# Patient Record
Sex: Male | Born: 1935
Health system: Southern US, Community
[De-identification: ages and names within clinical notes are randomized; demographics above are authoritative.]

## PROBLEM LIST (undated history)

## (undated) DIAGNOSIS — K635 Polyp of colon: Secondary | ICD-10-CM

## (undated) DIAGNOSIS — K219 Gastro-esophageal reflux disease without esophagitis: Secondary | ICD-10-CM

## (undated) DIAGNOSIS — I82409 Acute embolism and thrombosis of unspecified deep veins of unspecified lower extremity: Secondary | ICD-10-CM

## (undated) DIAGNOSIS — C801 Malignant (primary) neoplasm, unspecified: Secondary | ICD-10-CM

## (undated) DIAGNOSIS — F32A Depression, unspecified: Secondary | ICD-10-CM

## (undated) DIAGNOSIS — E785 Hyperlipidemia, unspecified: Secondary | ICD-10-CM

## (undated) DIAGNOSIS — S4292XA Fracture of left shoulder girdle, part unspecified, initial encounter for closed fracture: Secondary | ICD-10-CM

## (undated) DIAGNOSIS — F329 Major depressive disorder, single episode, unspecified: Secondary | ICD-10-CM

## (undated) DIAGNOSIS — I10 Essential (primary) hypertension: Secondary | ICD-10-CM

## (undated) HISTORY — DX: Depression, unspecified: F32.A

## (undated) HISTORY — PX: MECKEL DIVERTICULUM EXCISION: SHX314

## (undated) HISTORY — PX: JOINT REPLACEMENT: SHX530

## (undated) HISTORY — DX: Acute embolism and thrombosis of unspecified deep veins of unspecified lower extremity: I82.409

## (undated) HISTORY — DX: Major depressive disorder, single episode, unspecified: F32.9

## (undated) HISTORY — DX: Polyp of colon: K63.5

## (undated) HISTORY — DX: Essential (primary) hypertension: I10

## (undated) HISTORY — DX: Fracture of left shoulder girdle, part unspecified, initial encounter for closed fracture: S42.92XA

## (undated) HISTORY — PX: VASECTOMY: SHX75

## (undated) HISTORY — DX: Gastro-esophageal reflux disease without esophagitis: K21.9

## (undated) HISTORY — PX: OTHER SURGICAL HISTORY: SHX169

## (undated) HISTORY — DX: Malignant (primary) neoplasm, unspecified: C80.1

## (undated) HISTORY — DX: Hyperlipidemia, unspecified: E78.5

---

## 1995-03-14 DIAGNOSIS — I82409 Acute embolism and thrombosis of unspecified deep veins of unspecified lower extremity: Secondary | ICD-10-CM

## 1995-03-14 HISTORY — DX: Acute embolism and thrombosis of unspecified deep veins of unspecified lower extremity: I82.409

## 1999-05-24 ENCOUNTER — Ambulatory Visit (HOSPITAL_COMMUNITY): Admission: RE | Admit: 1999-05-24 | Discharge: 1999-05-24 | Payer: Self-pay | Admitting: Internal Medicine

## 1999-05-24 ENCOUNTER — Encounter: Payer: Self-pay | Admitting: Internal Medicine

## 1999-06-07 ENCOUNTER — Encounter: Payer: Self-pay | Admitting: Orthopedic Surgery

## 1999-06-13 ENCOUNTER — Encounter: Payer: Self-pay | Admitting: Orthopedic Surgery

## 1999-06-13 ENCOUNTER — Inpatient Hospital Stay (HOSPITAL_COMMUNITY): Admission: RE | Admit: 1999-06-13 | Discharge: 1999-06-18 | Payer: Self-pay | Admitting: Orthopedic Surgery

## 1999-06-13 ENCOUNTER — Encounter (INDEPENDENT_AMBULATORY_CARE_PROVIDER_SITE_OTHER): Payer: Self-pay

## 1999-06-16 ENCOUNTER — Encounter: Payer: Self-pay | Admitting: Orthopedic Surgery

## 1999-06-18 ENCOUNTER — Encounter: Payer: Self-pay | Admitting: Orthopedic Surgery

## 2000-08-27 ENCOUNTER — Emergency Department (HOSPITAL_COMMUNITY): Admission: EM | Admit: 2000-08-27 | Discharge: 2000-08-27 | Payer: Self-pay | Admitting: Internal Medicine

## 2000-09-05 ENCOUNTER — Emergency Department (HOSPITAL_COMMUNITY): Admission: EM | Admit: 2000-09-05 | Discharge: 2000-09-05 | Payer: Self-pay | Admitting: Emergency Medicine

## 2002-03-13 DIAGNOSIS — C801 Malignant (primary) neoplasm, unspecified: Secondary | ICD-10-CM

## 2002-03-13 HISTORY — PX: PROSTATECTOMY: SHX69

## 2002-03-13 HISTORY — DX: Malignant (primary) neoplasm, unspecified: C80.1

## 2002-12-09 ENCOUNTER — Ambulatory Visit: Admission: RE | Admit: 2002-12-09 | Discharge: 2003-02-12 | Payer: Self-pay | Admitting: Radiation Oncology

## 2002-12-17 ENCOUNTER — Encounter: Payer: Self-pay | Admitting: Urology

## 2002-12-17 ENCOUNTER — Encounter: Admission: RE | Admit: 2002-12-17 | Discharge: 2002-12-17 | Payer: Self-pay | Admitting: Urology

## 2003-01-21 ENCOUNTER — Encounter (INDEPENDENT_AMBULATORY_CARE_PROVIDER_SITE_OTHER): Payer: Self-pay | Admitting: Specialist

## 2003-01-21 ENCOUNTER — Inpatient Hospital Stay (HOSPITAL_COMMUNITY): Admission: RE | Admit: 2003-01-21 | Discharge: 2003-01-24 | Payer: Self-pay | Admitting: Urology

## 2003-05-30 ENCOUNTER — Emergency Department (HOSPITAL_COMMUNITY): Admission: EM | Admit: 2003-05-30 | Discharge: 2003-05-30 | Payer: Self-pay | Admitting: Emergency Medicine

## 2003-06-25 ENCOUNTER — Ambulatory Visit: Admission: RE | Admit: 2003-06-25 | Discharge: 2003-08-17 | Payer: Self-pay | Admitting: Radiation Oncology

## 2003-09-17 ENCOUNTER — Ambulatory Visit: Admission: RE | Admit: 2003-09-17 | Discharge: 2003-12-09 | Payer: Self-pay | Admitting: Radiation Oncology

## 2003-12-22 ENCOUNTER — Ambulatory Visit: Admission: RE | Admit: 2003-12-22 | Discharge: 2003-12-22 | Payer: Self-pay | Admitting: Radiation Oncology

## 2004-03-01 ENCOUNTER — Ambulatory Visit: Payer: Self-pay | Admitting: Internal Medicine

## 2004-03-10 ENCOUNTER — Ambulatory Visit: Payer: Self-pay | Admitting: Internal Medicine

## 2004-03-13 HISTORY — PX: COLONOSCOPY: SHX174

## 2004-03-21 ENCOUNTER — Ambulatory Visit: Payer: Self-pay | Admitting: Internal Medicine

## 2004-05-11 ENCOUNTER — Ambulatory Visit: Payer: Self-pay | Admitting: Internal Medicine

## 2004-05-12 ENCOUNTER — Encounter: Admission: RE | Admit: 2004-05-12 | Discharge: 2004-05-12 | Payer: Self-pay | Admitting: Internal Medicine

## 2004-05-19 ENCOUNTER — Ambulatory Visit: Payer: Self-pay

## 2004-05-31 ENCOUNTER — Ambulatory Visit: Payer: Self-pay | Admitting: Internal Medicine

## 2004-06-22 ENCOUNTER — Ambulatory Visit: Payer: Self-pay | Admitting: Internal Medicine

## 2004-09-05 ENCOUNTER — Ambulatory Visit: Payer: Self-pay | Admitting: Internal Medicine

## 2004-09-19 ENCOUNTER — Ambulatory Visit: Payer: Self-pay | Admitting: Internal Medicine

## 2004-09-19 ENCOUNTER — Encounter: Admission: RE | Admit: 2004-09-19 | Discharge: 2004-09-19 | Payer: Self-pay | Admitting: Internal Medicine

## 2004-10-06 ENCOUNTER — Encounter: Admission: RE | Admit: 2004-10-06 | Discharge: 2005-01-04 | Payer: Self-pay | Admitting: Internal Medicine

## 2004-12-05 ENCOUNTER — Ambulatory Visit: Payer: Self-pay | Admitting: Internal Medicine

## 2004-12-12 ENCOUNTER — Ambulatory Visit: Payer: Self-pay | Admitting: Internal Medicine

## 2005-06-06 ENCOUNTER — Ambulatory Visit: Payer: Self-pay | Admitting: Internal Medicine

## 2005-06-12 ENCOUNTER — Ambulatory Visit: Payer: Self-pay | Admitting: Internal Medicine

## 2005-07-19 ENCOUNTER — Ambulatory Visit: Payer: Self-pay | Admitting: Internal Medicine

## 2005-09-06 ENCOUNTER — Ambulatory Visit: Payer: Self-pay | Admitting: Internal Medicine

## 2005-11-22 ENCOUNTER — Ambulatory Visit: Payer: Self-pay | Admitting: Internal Medicine

## 2006-01-25 ENCOUNTER — Ambulatory Visit: Payer: Self-pay | Admitting: Internal Medicine

## 2006-05-02 DIAGNOSIS — R7309 Other abnormal glucose: Secondary | ICD-10-CM | POA: Insufficient documentation

## 2006-05-02 DIAGNOSIS — D126 Benign neoplasm of colon, unspecified: Secondary | ICD-10-CM | POA: Insufficient documentation

## 2006-11-09 ENCOUNTER — Encounter: Payer: Self-pay | Admitting: Internal Medicine

## 2006-12-18 ENCOUNTER — Telehealth (INDEPENDENT_AMBULATORY_CARE_PROVIDER_SITE_OTHER): Payer: Self-pay | Admitting: *Deleted

## 2006-12-18 ENCOUNTER — Observation Stay (HOSPITAL_COMMUNITY): Admission: EM | Admit: 2006-12-18 | Discharge: 2006-12-19 | Payer: Self-pay | Admitting: Emergency Medicine

## 2006-12-24 ENCOUNTER — Ambulatory Visit: Payer: Self-pay

## 2006-12-25 ENCOUNTER — Ambulatory Visit: Payer: Self-pay | Admitting: Internal Medicine

## 2006-12-25 DIAGNOSIS — M2550 Pain in unspecified joint: Secondary | ICD-10-CM | POA: Insufficient documentation

## 2006-12-25 DIAGNOSIS — E785 Hyperlipidemia, unspecified: Secondary | ICD-10-CM | POA: Insufficient documentation

## 2006-12-25 DIAGNOSIS — R1013 Epigastric pain: Secondary | ICD-10-CM | POA: Insufficient documentation

## 2006-12-25 DIAGNOSIS — K219 Gastro-esophageal reflux disease without esophagitis: Secondary | ICD-10-CM | POA: Insufficient documentation

## 2006-12-25 DIAGNOSIS — Z8546 Personal history of malignant neoplasm of prostate: Secondary | ICD-10-CM | POA: Insufficient documentation

## 2006-12-25 DIAGNOSIS — R7989 Other specified abnormal findings of blood chemistry: Secondary | ICD-10-CM | POA: Insufficient documentation

## 2006-12-25 DIAGNOSIS — E7800 Pure hypercholesterolemia, unspecified: Secondary | ICD-10-CM | POA: Insufficient documentation

## 2006-12-25 DIAGNOSIS — I10 Essential (primary) hypertension: Secondary | ICD-10-CM | POA: Insufficient documentation

## 2006-12-25 DIAGNOSIS — F329 Major depressive disorder, single episode, unspecified: Secondary | ICD-10-CM | POA: Insufficient documentation

## 2006-12-25 DIAGNOSIS — F32A Depression, unspecified: Secondary | ICD-10-CM | POA: Insufficient documentation

## 2006-12-31 ENCOUNTER — Encounter (INDEPENDENT_AMBULATORY_CARE_PROVIDER_SITE_OTHER): Payer: Self-pay | Admitting: *Deleted

## 2007-01-01 ENCOUNTER — Ambulatory Visit: Payer: Self-pay | Admitting: Internal Medicine

## 2007-01-07 ENCOUNTER — Encounter: Admission: RE | Admit: 2007-01-07 | Discharge: 2007-01-07 | Payer: Self-pay | Admitting: Gastroenterology

## 2007-01-07 ENCOUNTER — Ambulatory Visit: Payer: Self-pay | Admitting: Internal Medicine

## 2007-01-09 ENCOUNTER — Encounter (INDEPENDENT_AMBULATORY_CARE_PROVIDER_SITE_OTHER): Payer: Self-pay | Admitting: *Deleted

## 2007-01-30 ENCOUNTER — Encounter: Payer: Self-pay | Admitting: Internal Medicine

## 2007-07-03 ENCOUNTER — Telehealth (INDEPENDENT_AMBULATORY_CARE_PROVIDER_SITE_OTHER): Payer: Self-pay | Admitting: *Deleted

## 2007-10-04 ENCOUNTER — Encounter (INDEPENDENT_AMBULATORY_CARE_PROVIDER_SITE_OTHER): Payer: Self-pay | Admitting: *Deleted

## 2007-10-21 ENCOUNTER — Ambulatory Visit: Payer: Self-pay | Admitting: Internal Medicine

## 2007-10-21 DIAGNOSIS — IMO0001 Reserved for inherently not codable concepts without codable children: Secondary | ICD-10-CM | POA: Insufficient documentation

## 2007-10-21 DIAGNOSIS — D692 Other nonthrombocytopenic purpura: Secondary | ICD-10-CM | POA: Insufficient documentation

## 2007-10-26 LAB — CONVERTED CEMR LAB
Eosinophils Absolute: 0.1 10*3/uL (ref 0.0–0.7)
Eosinophils Relative: 1.6 % (ref 0.0–5.0)
HCT: 44.4 % (ref 39.0–52.0)
Hemoglobin: 15.1 g/dL (ref 13.0–17.0)
MCV: 95.2 fL (ref 78.0–100.0)
Monocytes Absolute: 0.5 10*3/uL (ref 0.1–1.0)
Monocytes Relative: 12.5 % — ABNORMAL HIGH (ref 3.0–12.0)
Neutro Abs: 2.1 10*3/uL (ref 1.4–7.7)
Platelets: 129 10*3/uL — ABNORMAL LOW (ref 150–400)
RDW: 13.2 % (ref 11.5–14.6)

## 2007-10-28 ENCOUNTER — Encounter (INDEPENDENT_AMBULATORY_CARE_PROVIDER_SITE_OTHER): Payer: Self-pay | Admitting: *Deleted

## 2007-11-14 ENCOUNTER — Encounter: Payer: Self-pay | Admitting: Internal Medicine

## 2007-12-06 ENCOUNTER — Ambulatory Visit: Payer: Self-pay | Admitting: Internal Medicine

## 2007-12-06 DIAGNOSIS — T887XXA Unspecified adverse effect of drug or medicament, initial encounter: Secondary | ICD-10-CM | POA: Insufficient documentation

## 2007-12-20 ENCOUNTER — Encounter (INDEPENDENT_AMBULATORY_CARE_PROVIDER_SITE_OTHER): Payer: Self-pay | Admitting: *Deleted

## 2007-12-20 LAB — CONVERTED CEMR LAB
Cholesterol: 129 mg/dL (ref 0–200)
LDL Cholesterol: 84 mg/dL (ref 0–99)
Total CHOL/HDL Ratio: 3.5
Triglycerides: 43 mg/dL (ref 0–149)
VLDL: 9 mg/dL (ref 0–40)

## 2008-02-03 ENCOUNTER — Ambulatory Visit: Payer: Self-pay | Admitting: Internal Medicine

## 2008-02-03 DIAGNOSIS — S93409A Sprain of unspecified ligament of unspecified ankle, initial encounter: Secondary | ICD-10-CM | POA: Insufficient documentation

## 2008-02-03 LAB — CONVERTED CEMR LAB
Blood in Urine, dipstick: NEGATIVE
Nitrite: NEGATIVE
Protein, U semiquant: NEGATIVE
Urobilinogen, UA: 0.2
WBC Urine, dipstick: NEGATIVE

## 2008-02-11 ENCOUNTER — Encounter (INDEPENDENT_AMBULATORY_CARE_PROVIDER_SITE_OTHER): Payer: Self-pay | Admitting: *Deleted

## 2008-02-11 LAB — CONVERTED CEMR LAB
ALT: 22 units/L (ref 0–53)
Amylase: 53 units/L (ref 27–131)
Basophils Absolute: 0 10*3/uL (ref 0.0–0.1)
Eosinophils Absolute: 0.1 10*3/uL (ref 0.0–0.7)
HCT: 44.4 % (ref 39.0–52.0)
Hemoglobin: 15.4 g/dL (ref 13.0–17.0)
Lipase: 25 units/L (ref 11.0–59.0)
MCHC: 34.7 g/dL (ref 30.0–36.0)
MCV: 94.6 fL (ref 78.0–100.0)
Monocytes Absolute: 0.5 10*3/uL (ref 0.1–1.0)
Monocytes Relative: 9.9 % (ref 3.0–12.0)
Neutro Abs: 2.8 10*3/uL (ref 1.4–7.7)
RDW: 12.7 % (ref 11.5–14.6)
Total Bilirubin: 1.2 mg/dL (ref 0.3–1.2)

## 2008-02-18 ENCOUNTER — Ambulatory Visit: Payer: Self-pay | Admitting: Internal Medicine

## 2008-02-18 LAB — CONVERTED CEMR LAB
OCCULT 1: NEGATIVE
OCCULT 3: NEGATIVE

## 2008-02-19 ENCOUNTER — Encounter (INDEPENDENT_AMBULATORY_CARE_PROVIDER_SITE_OTHER): Payer: Self-pay | Admitting: *Deleted

## 2008-03-25 ENCOUNTER — Telehealth (INDEPENDENT_AMBULATORY_CARE_PROVIDER_SITE_OTHER): Payer: Self-pay | Admitting: *Deleted

## 2008-05-14 ENCOUNTER — Encounter: Payer: Self-pay | Admitting: Internal Medicine

## 2008-05-20 ENCOUNTER — Telehealth (INDEPENDENT_AMBULATORY_CARE_PROVIDER_SITE_OTHER): Payer: Self-pay | Admitting: *Deleted

## 2008-05-21 ENCOUNTER — Ambulatory Visit: Payer: Self-pay | Admitting: Internal Medicine

## 2008-05-21 DIAGNOSIS — R1032 Left lower quadrant pain: Secondary | ICD-10-CM | POA: Insufficient documentation

## 2008-05-21 DIAGNOSIS — M216X9 Other acquired deformities of unspecified foot: Secondary | ICD-10-CM | POA: Insufficient documentation

## 2008-05-21 LAB — CONVERTED CEMR LAB
Blood in Urine, dipstick: NEGATIVE
Nitrite: NEGATIVE
Specific Gravity, Urine: 1.025
WBC Urine, dipstick: NEGATIVE

## 2008-06-30 ENCOUNTER — Ambulatory Visit: Payer: Self-pay | Admitting: Internal Medicine

## 2008-06-30 ENCOUNTER — Encounter (INDEPENDENT_AMBULATORY_CARE_PROVIDER_SITE_OTHER): Payer: Self-pay | Admitting: *Deleted

## 2008-06-30 LAB — CONVERTED CEMR LAB: OCCULT 3: NEGATIVE

## 2008-09-07 ENCOUNTER — Telehealth: Payer: Self-pay | Admitting: Internal Medicine

## 2008-09-15 ENCOUNTER — Encounter: Admission: RE | Admit: 2008-09-15 | Discharge: 2008-10-20 | Payer: Self-pay | Admitting: Internal Medicine

## 2008-09-18 ENCOUNTER — Encounter: Payer: Self-pay | Admitting: Internal Medicine

## 2008-10-20 ENCOUNTER — Encounter: Payer: Self-pay | Admitting: Internal Medicine

## 2008-11-06 ENCOUNTER — Ambulatory Visit: Payer: Self-pay | Admitting: Internal Medicine

## 2008-12-22 ENCOUNTER — Encounter: Payer: Self-pay | Admitting: Internal Medicine

## 2009-01-01 ENCOUNTER — Encounter: Payer: Self-pay | Admitting: Internal Medicine

## 2009-01-05 ENCOUNTER — Ambulatory Visit: Payer: Self-pay | Admitting: Internal Medicine

## 2009-02-16 ENCOUNTER — Ambulatory Visit: Payer: Self-pay | Admitting: Internal Medicine

## 2009-02-16 DIAGNOSIS — M542 Cervicalgia: Secondary | ICD-10-CM | POA: Insufficient documentation

## 2009-06-17 ENCOUNTER — Ambulatory Visit: Payer: Self-pay | Admitting: Internal Medicine

## 2009-07-01 ENCOUNTER — Encounter: Payer: Self-pay | Admitting: Internal Medicine

## 2009-07-16 ENCOUNTER — Emergency Department (HOSPITAL_COMMUNITY): Admission: EM | Admit: 2009-07-16 | Discharge: 2009-07-16 | Payer: Self-pay | Admitting: Emergency Medicine

## 2009-11-02 ENCOUNTER — Ambulatory Visit: Payer: Self-pay | Admitting: Internal Medicine

## 2009-11-02 DIAGNOSIS — M545 Low back pain, unspecified: Secondary | ICD-10-CM | POA: Insufficient documentation

## 2009-12-06 ENCOUNTER — Ambulatory Visit: Payer: Self-pay | Admitting: Internal Medicine

## 2009-12-16 ENCOUNTER — Encounter: Payer: Self-pay | Admitting: Internal Medicine

## 2009-12-21 ENCOUNTER — Ambulatory Visit: Payer: Self-pay | Admitting: Internal Medicine

## 2009-12-21 DIAGNOSIS — M25519 Pain in unspecified shoulder: Secondary | ICD-10-CM | POA: Insufficient documentation

## 2009-12-27 ENCOUNTER — Telehealth (INDEPENDENT_AMBULATORY_CARE_PROVIDER_SITE_OTHER): Payer: Self-pay | Admitting: *Deleted

## 2009-12-28 ENCOUNTER — Encounter: Payer: Self-pay | Admitting: Internal Medicine

## 2010-01-19 ENCOUNTER — Telehealth (INDEPENDENT_AMBULATORY_CARE_PROVIDER_SITE_OTHER): Payer: Self-pay | Admitting: *Deleted

## 2010-01-20 ENCOUNTER — Telehealth: Payer: Self-pay | Admitting: Internal Medicine

## 2010-02-01 ENCOUNTER — Encounter: Payer: Self-pay | Admitting: Internal Medicine

## 2010-03-09 ENCOUNTER — Encounter: Payer: Self-pay | Admitting: Internal Medicine

## 2010-04-06 ENCOUNTER — Encounter: Payer: Self-pay | Admitting: Internal Medicine

## 2010-04-06 ENCOUNTER — Ambulatory Visit
Admission: RE | Admit: 2010-04-06 | Discharge: 2010-04-06 | Payer: Self-pay | Source: Home / Self Care | Attending: Internal Medicine | Admitting: Internal Medicine

## 2010-04-06 DIAGNOSIS — M48061 Spinal stenosis, lumbar region without neurogenic claudication: Secondary | ICD-10-CM | POA: Insufficient documentation

## 2010-04-10 LAB — CONVERTED CEMR LAB
ALT: 19 units/L (ref 0–53)
AST: 26 units/L (ref 0–37)
Bilirubin, Direct: 0.1 mg/dL (ref 0.0–0.3)
Hgb A1c MFr Bld: 5.9 % (ref 4.6–6.0)
Lipase: 23 units/L (ref 11.0–59.0)
Total Bilirubin: 0.9 mg/dL (ref 0.3–1.2)

## 2010-04-12 NOTE — Assessment & Plan Note (Signed)
Summary: lower back pain/cbs   Vital Signs:  Patient profile:   77 year old male Weight:      182 pounds Temp:     98.4 degrees F oral Pulse rate:   82 / minute Resp:     17 per minute BP sitting:   120 / 70  (left arm)  Vitals Entered By: Jeremy Johann CMA (November 02, 2009 2:40 PM) CC: pain lower back due to recent falls, Back pain   CC:  pain lower back due to recent falls and Back pain.  History of Present Illness: Back Pain      This is a 77 year old man who presents with Back pain for > 4 months.  The patient reports weakness, loss of sensation, and rest pain, but denies fever, chills, fecal incontinence, urinary incontinence, urinary retention, dysuria, and inability to care for self.  The pain is located in the mid low back.  The pain began at home, gradually, and after a fall , initially 4 months  ago . He fell due to uneven ground. No neuro or cardiac triggers.  The pain radiates to the left leg to  the knee.  The pain is made worse by standing  & extension of spine. The pain is made better by acetaminophen & Biofreeze temporarily.  Risk factors for serious underlying conditions include duration of pain > 1 month, age >= 50 years, history of cancer( prostate cancer), and significant trauma. Physical Therapy of no benefit  in his opinion ( "cost $35 / session") PMH of fractured hip falling from step ladder 1997; S/P THR.  Current Medications (verified): 1)  Benazepril Hcl 40 Mg  Tabs (Benazepril Hcl) .... 1/2 Tab Daily 2)  Fluoxetine Hcl 10 Mg Caps (Fluoxetine Hcl) .[name].. 1 By Mouth Qd 3)  Omeprazole 20 Mg  Tbec (Omeprazole) .[name].. 1 By Mouth Bid 4)  Fish Oil 4 Grams Daily 5)  Vit E 200iu Qd 6)  Multivitamin 7)  Vit C 2 Tabs Daily 8)  Oscal 500mg  Plus D 9)  Tylenol Prn 10)  Simvastatin 40 Mg Tabs (Simvastatin) .[name].. 1 At Bedtime. Needs Labwork.  Allergies (verified): No Known Drug Allergies  Past History:  Past Medical History: Prostate cancer, PMH  of Depression GERD  w/o PMH of  DUD Hyperlipidemia Hypertension  diverticulum  resected age 61  Past Surgical History: Colonoscopy 2006 negative , Dr Randa Evens Prostatectomy & radiation Total hip replacement L X 2 Colectomy age 72 Endo ? 21: neg  Review of Systems GI:  Denies bloody stools and dark tarry stools. GU:  Denies discharge and hematuria. Derm:  Denies lesion(s) and rash.  Physical Exam  General:  Thin ,in no acute distress; alert,appropriate and cooperative throughout examination Abdomen:  Bowel sounds positive,abdomen soft and non-tender without masses, organomegaly or hernias noted. Aortic bruit w/o AAA Msk:  No deformity or scoliosis noted of thoracic or lumbar spine.  No pain to percussion LS spine. Classic LB "crawl" out of chair & up from exam table Pulses:  R and L ,femoral,dorsalis pedis and posterior tibial pulses are full and equal bilaterally Extremities:  No clubbing, cyanosis, edema, or deformity noted with normal full range of motion of all joints.   Negative SLR bilaterally Neurologic:  alert & oriented X3, strength normal in all extremities,  heel & toe gait unsteady( tendency to go backwards  & difficulty keeping  toes  of RLE up), and DTRs symmetrical and normal.   Skin:  Multiple small paillomas LS  area; large epidermoid inclusion cyst LS area Cervical Nodes:  No lymphadenopathy noted Axillary Nodes:  No palpable lymphadenopathy Inguinal Nodes:  No significant adenopathy Psych:  memory intact for recent and remote, normally interactive, and good eye contact.     Impression & Recommendations:  Problem # 1:  LOW BACK PAIN SYNDROME (ICD-724.2)  The following medications were removed from the medication list:    Meloxicam 7.5 Mg Tabs (Meloxicam) .[name]... 1 two times a day as needed joint pain    Acetaminophen-codeine #4 300-60 Mg Tabs (Acetaminophen-codeine) .[name]... 1-2 q 4-6 hrs as needed His updated medication list for this problem includes:    Cyclobenzaprine Hcl 5 Mg Tabs  (Cyclobenzaprine hcl) .[name]... 1 two times a day & 1-2 at bedtime as needed back pain    Tylenol With Codeine #3 300-30 Mg Tabs (Acetaminophen-codeine) .[name]... 1-2 every 6 hrs as needed pain  Orders: T-Lumbar Spine w/Flex & Ext 4 Views (41324MW) Prescription Created Electronically [phone])  Problem # 2:  FOOT DROP, RIGHT (ICD-736.79) Equivocal  Problem # 3:  PROSTATE CANCER, HX OF (ICD-V10.46)  Complete Medication List: 1)  Benazepril Hcl 40 Mg Tabs (Benazepril hcl) .... 1/2 tab daily 2)  Fluoxetine Hcl 10 Mg Caps (Fluoxetine hcl) .[name].. 1 by mouth qd 3)  Omeprazole 20 Mg Tbec (Omeprazole) .[name].. 1 by mouth bid 4)  Fish Oil 4 Grams Daily  5)  Vit E 200iu Qd  6)  Multivitamin  7)  Vit C 2 Tabs Daily  8)  Oscal 500mg  Plus D  9)  Tylenol Prn  10)  Simvastatin 40 Mg Tabs (Simvastatin) .[name].. 1 at bedtime. needs labwork. 11)  Cyclobenzaprine Hcl 5 Mg Tabs (Cyclobenzaprine hcl) .[name].. 1 two times a day & 1-2 at bedtime as needed back pain 12)  Tylenol With Codeine #3 300-30 Mg Tabs (Acetaminophen-codeine) .[name].. 1-2 every 6 hrs as needed pain  Patient Instructions: 1)  Referral to Dr Ethelene Hal if no better with meds. Prescriptions: TYLENOL WITH CODEINE #3 300-30 MG TABS (ACETAMINOPHEN-CODEINE) 1-2 every 6 hrs as needed pain  #30 x 1   Entered and Authorized by:   Marga Melnick MD   Signed by:   Marga Melnick MD on 11/02/2009   Method used:   Printed then faxed to ...       [facility] [phone]       [address]       [place], [place]  [address]       Ph: [phone] or [phone]       Fax: [phone]   RxID:   [id number] CYCLOBENZAPRINE HCL 5 MG TABS (CYCLOBENZAPRINE HCL) 1 two times a day & 1-2 at bedtime as needed back pain  #20 x 0   Entered and Authorized by:   Marga Melnick MD   Signed by:   Marga Melnick MD on 11/02/2009   Method used:   Printed then faxed to ...       [facility] [address] (retail)       [address]       [place], [place]  [address]       Ph:  [phone] or [phone]       Fax: [phone]   RxID:   [id number]

## 2010-04-12 NOTE — Assessment & Plan Note (Signed)
Summary: FELL LAST TUESDAY/HURT SHOLDER//KN   Vital Signs:  Patient profile:   77 year old male Height:      72.75 inches Weight:      185.4 pounds BMI:     24.72 Temp:     98.7 degrees F oral Pulse rate:   84 / minute Resp:     17 per minute BP sitting:   122 / 70  (left arm) Cuff size:   large  Vitals Entered By: Shonna Chock CMA (December 21, 2009 1:44 PM) CC: Right shoulder injury, Shoulder pain   CC:  Right shoulder injury and Shoulder pain.  History of Present Illness: Shoulder Pain      This is a 77 year old man who presents with R shoulder pain since a fall 12/14/2009 after tripping.  The patient reports impaired superior  ROM due to pain, but denies numbness, weakness, tingling, locking, stiffness, swelling, and redness.   The patient describes the pain as intermittent and sharp.  The pain is better with rest.  The pain is worse with activity, elevation. Rx: ice then heat.   Current Medications (verified): 1)  Benazepril Hcl 40 Mg  Tabs (Benazepril Hcl) .... 1/2 Tab Daily 2)  Fluoxetine Hcl 10 Mg Caps (Fluoxetine Hcl) .[name].. 1 By Mouth Qd 3)  Omeprazole 20 Mg  Tbec (Omeprazole) .[name].. 1 By Mouth Bid 4)  Fish Oil 4 Grams Daily 5)  Multivitamin 6)  Vit C 2 Tabs Daily 7)  Oscal 500mg  Plus D 8)  Tylenol Prn 9)  Simvastatin 40 Mg Tabs (Simvastatin) .[name].. 1 At Bedtime. Needs Labwork.  Allergies (verified): No Known Drug Allergies  Physical Exam  General:  in no acute distress; alert,appropriate and cooperative throughout examination Extremities:  No clubbing, cyanosis, edema. Pain with passive or active ROM & to any opposition Neurologic:  strength normal in all extremities and DTRs symmetrical and normal.   Skin:  Large lipoma R upper  back; multiple skin polyps   Impression & Recommendations:  Problem # 1:  SHOULDER PAIN, RIGHT (ICD-719.41) R/O torn Rotator Cuff The following medications were removed from the medication list:    Cyclobenzaprine Hcl 5 Mg Tabs  (Cyclobenzaprine hcl) .[name]... 1 two times a day & 1-2 at bedtime as needed back pain    Tylenol With Codeine #3 300-30 Mg Tabs (Acetaminophen-codeine) .[name]... 1-2 every 6 hrs as needed pain His updated medication list for this problem includes:    Hydrocodone-acetaminophen 5-500 Mg Tabs (Hydrocodone-acetaminophen) .[name]... 1 every 4 hrs as needed pain  Orders: Orthopedic Surgeon Referral (Ortho Surgeon)  Complete Medication List: 1)  Benazepril Hcl 40 Mg Tabs (Benazepril hcl) .... 1/2 tab daily 2)  Fluoxetine Hcl 10 Mg Caps (Fluoxetine hcl) .[name].. 1 by mouth qd 3)  Omeprazole 20 Mg Tbec (Omeprazole) .[name].. 1 by mouth bid 4)  Fish Oil 4 Grams Daily  5)  Multivitamin  6)  Vit C 2 Tabs Daily  7)  Oscal 500mg  Plus D  8)  Tylenol Prn  9)  Simvastatin 40 Mg Tabs (Simvastatin) .[name].. 1 at bedtime. needs labwork. 10)  Hydrocodone-acetaminophen 5-500 Mg Tabs (Hydrocodone-acetaminophen) .[name].. 1 every 4 hrs as needed pain  Patient Instructions: 1)  Take office to Ortho consultant Prescriptions: HYDROCODONE-ACETAMINOPHEN 5-500 MG TABS (HYDROCODONE-ACETAMINOPHEN) 1 every 4 hrs as needed pain  #30 x 0   Entered and Authorized by:   Marga Melnick MD   Signed by:   Marga Melnick MD on 12/21/2009   Method used:   Print then  Give to Patient   RxID:   [id number]

## 2010-04-12 NOTE — Op Note (Signed)
Summary: Lumbar Epidural Steroid Injection/[facility]   Lumbar Epidural Steroid Injection/[facility]   Imported By: Lanelle Bal 02/11/2010 10:56:20  _____________________________________________________________________  External Attachment:    Type:   Image     Comment:   External Document

## 2010-04-12 NOTE — Letter (Signed)
Summary: [facility]  [facility]   Imported By: Lanelle Bal 07/09/2009 13:14:46  _____________________________________________________________________  External Attachment:    Type:   Image     Comment:   External Document

## 2010-04-12 NOTE — Op Note (Signed)
Summary: Lumbar Epidural Steroid Injection/[facility]   Lumbar Epidural Steroid Injection/[facility]   Imported By: Lanelle Bal 01/10/2010 13:40:29  _____________________________________________________________________  External Attachment:    Type:   Image     Comment:   External Document

## 2010-04-12 NOTE — Progress Notes (Signed)
Summary: hydrocodone-acetaminophen refill  Phone Note Refill Request Message from:  Fax from Pharmacy on January 19, 2010 12:14 PM  Refills Requested: Medication #1:  HYDROCODONE-ACETAMINOPHEN 5-500 MG TABS 1 every 4 hrs as needed pain.   [facility]   [address]   fax [phone]   qty = 30  Initial call taken by: Jerolyn Shin,  January 19, 2010 12:15 PM  Follow-up for Phone Call        Dr.Hopper patient seen Orthro(Dr.Norris) on 12/29/2009 for pain. Would you like to continue refilling pain med or have patient contact Orthro for refills?  Please advise Follow-up by: Shonna Chock CMA,  January 19, 2010 1:22 PM  Additional Follow-up for Phone Call Additional follow up Details #1::        If still having pain , he needs to see Orthopedist ASAP. Refil #30  until that appt Additional Follow-up by: Marga Melnick MD,  January 19, 2010 2:24 PM    Additional Follow-up for Phone Call Additional follow up Details #2::    I spoke with Claris Gower (patient's wife) and she was informed if patient still with pain to f/u with [facility], she indicated he will see them again soon for another injection. I informed her after this refill, additional refills to come from orthro. Ok'd.Shonna Chock CMA  January 19, 2010 2:39 PM   Prescriptions: HYDROCODONE-ACETAMINOPHEN 5-500 MG TABS (HYDROCODONE-ACETAMINOPHEN) 1 every 4 hrs as needed pain  #30 x 0   Entered and Authorized by:   Marga Melnick MD   Signed by:   Marga Melnick MD on 01/19/2010   Method used:   Print then Give to Patient   RxID:   [id number]

## 2010-04-12 NOTE — Consult Note (Signed)
Summary: [facility]  [facility]   Imported By: Lanelle Bal 12/29/2009 08:50:41  _____________________________________________________________________  External Attachment:    Type:   Image     Comment:   External Document

## 2010-04-12 NOTE — Progress Notes (Signed)
Summary: Pain med  Phone Note Call from Patient Call back at Home Phone [phone]   Summary of Call: Patient left message on triage that he apologizes for any inconvenience in relation to his meds. He has seen Dr. Ethelene Hal, had and MRI and a shot. He notes that he was asked if he needed something for pain and he said no b/c he still has one refill from our office. Patient later discovered that he did not have a refill and accidentally requested it from here. Patient notes that the med will come from Dr. Ethelene Hal office from now on.  Initial call taken by: Lucious Groves CMA,  January 20, 2010 2:32 PM  Follow-up for Phone Call        noted Follow-up by: Marga Melnick MD,  January 20, 2010 5:29 PM

## 2010-04-12 NOTE — Assessment & Plan Note (Signed)
Summary: FELL TUESDAY/HURT CHEST/LEFT ARM/KDC   Vital Signs:  Patient profile:   77 year old male Weight:      181.4 pounds O2 Sat:      99 % Temp:     99.7 degrees F oral Pulse rate:   84 / minute Resp:     17 per minute BP sitting:   134 / 70  (left arm) Cuff size:   large  Vitals Entered By: Shonna Chock (June 17, 2009 2:13 PM) CC: 1.) Fell Tuesday and injured left side-very painful  2.) Cold since Tuesday Comments REVIEWED MED LIST, PATIENT AGREED DOSE AND INSTRUCTION CORRECT    CC:  1.) Fell Tuesday and injured left side-very painful  2.) Cold since Tuesday.  History of Present Illness: He tripped 06/15/2009 on deck striking L inferior chest wall with LOC for "a second". No neuro or cardiac  trigger prior to event. Pleuritic pain with respirations, change in bed position  or  with  ROM of LUE . Rx: Tylenol with benefit. RTI 04/05 with ST & head congestion followed by chest congestion 04/06. Zicam helped ST; also [facility] Cold/ Sinus [facility]  Allergies (verified): No Known Drug Allergies  Review of Systems General:  Denies chills, fever, and sweats. ENT:  Complains of nasal congestion and sinus pressure; Yellow discharge & frontal headache. No facial pain. Resp:  Complains of chest pain with inspiration, coughing up blood, pleuritic, shortness of breath, and wheezing; Minor SOB & wheezing; trace hemoptysis today. Brown sputum today.  Physical Exam  General:  in no acute distress; appropriate and cooperative throughout examination; appears mildly uncomfortable-appearing.   Ears:  External ear exam shows no significant lesions or deformities.  Otoscopic examination reveals clear canals, tympanic membranes are intact bilaterally without bulging, retraction, inflammation or discharge. Hearing is grossly normal bilaterally. Nose:  External nasal examination shows no deformity or inflammation. Nasal mucosa are pink and moist without lesions or exudates. Mouth:  Oral mucosa and oropharynx  without lesions or exudates.  Teeth in good repair. mild pharyngeal erythema.   Lungs:  Splinting on L; rub LLL ; tender L AAL inferiorly Heart:  Normal rate and regular rhythm. S1 and S2 normal without gallop, murmur, click, rub.S4 Skin:  Lipoma R upper back Cervical Nodes:  No lymphadenopathy noted Axillary Nodes:  No palpable lymphadenopathy Psych:  Oriented X3, flat affect, and subdued.     Impression & Recommendations:  Problem # 1:  CHEST WALL INJURY (ICD-959.11)  Orders: T-2 View CXR (71020TC) T-Ribs Unilateral 2 Views (71100TC) Prescription Created Electronically [phone])  Problem # 2:  BRONCHITIS-ACUTE (ICD-466.0)  Orders: T-2 View CXR (71020TC) Prescription Created Electronically ([phone])  His updated medication list for this problem includes:    Amoxicillin-pot Clavulanate 875-125 Mg Tabs (Amoxicillin-pot clavulanate) .[name]... 1 q 12 hrs with a meal  Problem # 3:  SINUSITIS- ACUTE-NOS (ICD-461.9)  Orders: Prescription Created Electronically [phone])  His updated medication list for this problem includes:    Amoxicillin-pot Clavulanate 875-125 Mg Tabs (Amoxicillin-pot clavulanate) .[name]... 1 q 12 hrs with a meal  Complete Medication List: 1)  Benazepril Hcl 40 Mg Tabs (Benazepril hcl) .... 1/2 tab daily 2)  Fluoxetine Hcl 10 Mg Caps (Fluoxetine hcl) .[name].. 1 by mouth qd 3)  Omeprazole 20 Mg Tbec (Omeprazole) .[name].. 1 by mouth bid 4)  Fish Oil 4 Grams Daily  5)  Vit E 200iu Qd  6)  Multivitamin  7)  Vit C 2 Tabs Daily  8)  Oscal 500mg  Plus D  9)  Tylenol Prn  10)  Asa  11)  Acid Reducer Maximum Strength 150 Mg Tabs (Ranitidine hcl) .[name].. 1 q 12hrs pre meal 12)  Simvastatin 40 Mg Tabs (Simvastatin) .[name].. 1 at bedtime. needs labwork. 13)  Voltaren 1 % Gel (Diclofenac sodium) .... Apply two times a day as needed to painful areas 14)  Meloxicam 7.5 Mg Tabs (Meloxicam) .[name].. 1 two times a day as needed joint pain 15)  Amoxicillin-pot Clavulanate 875-125 Mg Tabs (Amoxicillin-pot  clavulanate) .[name].. 1 q 12 hrs with a meal 16)  Acetaminophen-codeine #4 300-60 Mg Tabs (Acetaminophen-codeine) .[name].. 1-2 q 4-6 hrs as needed  Patient Instructions: 1)  Drink as much fluid as you can tolerate for the next few days. 2)  Take 400-600mg  of Ibuprofen (Advil, Motrin) with food every 4-6 hours as needed for relief of  fever. Prescriptions: ACETAMINOPHEN-CODEINE #4 300-60 MG TABS (ACETAMINOPHEN-CODEINE) 1-2 q 4-6 hrs as needed  #30 x 1   Entered and Authorized by:   Marga Melnick MD   Signed by:   Marga Melnick MD on 06/17/2009   Method used:   Printed then faxed to ...       [facility] [phone][facility] (retail)       [address]       [place], [place]  [address]       Ph: [phone] or [phone]       Fax: [phone]   RxID:   [id number] AMOXICILLIN-POT CLAVULANATE 875-125 MG TABS (AMOXICILLIN-POT CLAVULANATE) 1 q 12 hrs with a meal  #20 x 0   Entered and Authorized by:   Marga Melnick MD   Signed by:   Marga Melnick MD on 06/17/2009   Method used:   Printed then faxed to ...       [facility] [address] (retail)       [address]       [place], [place]  [address]       Ph: [phone] or [phone]       Fax: [phone]   RxID:   [id number]

## 2010-04-12 NOTE — Assessment & Plan Note (Signed)
Summary: FLU SHOT/KN   Nurse Visit  CC: Flu shot./kb   Allergies: No Known Drug Allergies  Orders Added: 1)  Flu Vaccine 30yrs + MEDICARE PATIENTS [Q2039] 2)  Administration Flu vaccine - MCR [G0008]         Flu Vaccine Consent Questions     Do you have a history of severe allergic reactions to this vaccine? no    Any prior history of allergic reactions to egg and/or gelatin? no    Do you have a sensitivity to the preservative Thimersol? no    Do you have a past history of Guillan-Barre Syndrome? no    Do you currently have an acute febrile illness? no    Have you ever had a severe reaction to latex? no    Vaccine information given and explained to patient? yes    Are you currently pregnant? no    Lot Number:[id number]   Exp Date:09/10/2010   Site Given  Left Deltoid IM Lucious Groves CMA  December 06, 2009 1:18 PM

## 2010-04-12 NOTE — Progress Notes (Signed)
Summary: Refill Request  Phone Note Refill Request Message from:  Pharmacy  Refills Requested: Medication #1:  HYDROCODONE-ACETAMINOPHEN 5-500 MG TABS 1 every 4 hrs as needed pain.   Dosage confirmed as above?Dosage Confirmed   Supply Requested: 1 month   [facility] [address]   Next Appointment Scheduled: none Initial call taken by: Harold Barban,  December 27, 2009 8:55 AM  Follow-up for Phone Call        Filled 6 days ago # 30, Dr.Hopper please advise Follow-up by: Shonna Chock CMA,  December 27, 2009 10:23 AM  Additional Follow-up for Phone Call Additional follow up Details #1::        OK X1    Prescriptions: HYDROCODONE-ACETAMINOPHEN 5-500 MG TABS (HYDROCODONE-ACETAMINOPHEN) 1 every 4 hrs as needed pain  #30 x 0   Entered and Authorized by:   Marga Melnick MD   Signed by:   Marga Melnick MD on 12/27/2009   Method used:   Printed then faxed to ...       [facility] [address] (retail)       [address]       [place], [place]  [address]       Ph: [phone] or [phone]       Fax: [phone]   RxID:   [id number]

## 2010-04-14 NOTE — Assessment & Plan Note (Signed)
Summary: TO DISCUSS SOMETHING//PH   Vital Signs:  Patient profile:   77 year old male Weight:      176.4 pounds BMI:     23.52 Temp:     97.7 degrees F oral Pulse rate:   84 / minute Resp:     15 per minute BP sitting:   122 / 72  (left arm) Cuff size:   large  Vitals Entered By: Shonna Chock CMA (April 06, 2010 10:08 AM) CC: 1.) Patient with paper from Dr.Brooks recommending surgery for back   2.) MRI indicated calcium in blood, patient with several leg cramps (would like to discuss), Back pain, Heartburn   CC:  1.) Patient with paper from Dr.Brooks recommending surgery for back   2.) MRI indicated calcium in blood, patient with several leg cramps (would like to discuss), Back pain, and Heartburn.  History of Present Illness:      This is a 77 year old man who presents with Back pain  in context  Spinal Stenosis  since  a fall in 1997.  The patient reports urinary incontinence since prostatectomy , but denies weakness, loss of sensation ( he does have tingling in legs ), fecal incontinence, and urinary retention.  The pain is located in the mid low back.  The pain radiates to the right leg &  left leg below the knee.  The pain has no exacerbating factors.  The pain is made better by acetaminophen (Tylenol Arthritis) & ESI from Dr Ethelene Hal.  Dr Shon Baton has recommended lumbar decompression & in situ fusion L 3-4 .Pre op evaluation requested by Dr Shon Baton. Because of his wife's cancer treatments he plans to defer durgery until Fall 2012.     Hypertension Follow-Up:    The patient reports urinary frequency, but denies lightheadedness, headaches, and edema.  The patient denies the following associated symptoms: chest pain, chest pressure, dyspnea, and palpitations.  Adjunctive measures currently NOT  used by the patient include salt restriction.  BP controlled @ home.    GERD:  The patient denies acid reflux, sour taste in mouth, epigastric pain, and trouble swallowing.  The patient denies the  following alarm features: melena, dysphagia, hematemesis, and vomiting.  Symptoms are worse with spicy foods.  The patient has found the following treatments to be effective: a PPI.    Allergies: No Known Drug Allergies  Physical Exam  General:  Appears younger than age;well-nourished,in no acute distress; alert,appropriate and cooperative throughout examination Eyes:  No corneal or conjunctival inflammation noted. No icterus Mouth:  Oral mucosa and oropharynx without lesions or exudates.  Teeth in good repair. No pharyngeal erythema.   Lungs:  Normal respiratory effort, chest expands symmetrically. Lungs are clear to auscultation, no crackles or wheezes. Heart:  Normal rate and regular rhythm. S1 and S2 normal without gallop, murmur, click, rub. S4 Pulses:  R and L carotid,radial,dorsalis pedis and posterior tibial pulses are full and equal bilaterally Extremities:  No clubbing, cyanosis, edema. Neurologic:  alert & oriented X3, strength normal in all extremities, heel & toe gait normal except for difficulty with retropulsion; DTRs symmetrical and 1& 1/2 + Psych:  memory intact for recent and remote, normally interactive, and good eye contact.     Impression & Recommendations:  Problem # 1:  SPINAL STENOSIS, LUMBAR (ICD-724.02)  Problem # 2:  UNSPECIFIED PRE-OPERATIVE EXAMINATION (ICD-V72.84)  Problem # 3:  HYPERTENSION (ICD-401.9) controlled His updated medication list for this problem includes:    Benazepril Hcl 40 Mg Tabs (  Benazepril hcl) .[name]... 1/2 tab daily  Problem # 4:  GERD (ICD-530.81) controlled His updated medication list for this problem includes:    Omeprazole 20 Mg Tbec (Omeprazole) .[name]... 1 by mouth bid  Complete Medication List: 1)  Benazepril Hcl 40 Mg Tabs (Benazepril hcl) .... 1/2 tab daily 2)  Fluoxetine Hcl 10 Mg Caps (Fluoxetine hcl) .[name].. 1 by mouth qd 3)  Omeprazole 20 Mg Tbec (Omeprazole) .[name].. 1 by mouth bid 4)  Fish Oil 4 Grams Daily  5)  Multivitamin    6)  Vit C 2 Tabs Daily  7)  Oscal 500mg  Plus D  8)  Tylenol Prn  9)  Simvastatin 40 Mg Tabs (Simvastatin) .[name].. 1 at bedtime. needs labwork. 10)  Hydrocodone-acetaminophen 5-500 Mg Tabs (Hydrocodone-acetaminophen) .[name].. 1 every 4 hrs as needed pain  Patient Instructions: 1)  Please see me 3-4 weeks prior to scheduled surgery for medical clearance update.   Orders Added: 1)  Est. Patient Level IV [16109]

## 2010-04-14 NOTE — Procedures (Signed)
Summary: Colonoscopy/[facility]  Colonoscopy/[facility]   Imported By: Lanelle Bal 03/25/2010 12:24:38  _____________________________________________________________________  External Attachment:    Type:   Image     Comment:   External Document

## 2010-04-28 NOTE — Letter (Signed)
Summary: Surgical Clearance/[facility]  Surgical Clearance/[facility]   Imported By: Lanelle Bal 04/18/2010 14:23:46  _____________________________________________________________________  External Attachment:    Type:   Image     Comment:   External Document

## 2010-05-23 ENCOUNTER — Encounter: Payer: Self-pay | Admitting: Internal Medicine

## 2010-05-23 ENCOUNTER — Ambulatory Visit (INDEPENDENT_AMBULATORY_CARE_PROVIDER_SITE_OTHER): Payer: Self-pay | Admitting: Internal Medicine

## 2010-05-23 ENCOUNTER — Ambulatory Visit (INDEPENDENT_AMBULATORY_CARE_PROVIDER_SITE_OTHER)
Admission: RE | Admit: 2010-05-23 | Discharge: 2010-05-23 | Disposition: A | Discharge status: Home / Self Care | Payer: BC Managed Care – PPO | Source: Ambulatory Visit | Attending: Internal Medicine | Admitting: Internal Medicine

## 2010-05-23 ENCOUNTER — Other Ambulatory Visit: Payer: Self-pay | Admitting: Internal Medicine

## 2010-05-23 DIAGNOSIS — R0902 Hypoxemia: Secondary | ICD-10-CM

## 2010-05-23 DIAGNOSIS — J209 Acute bronchitis, unspecified: Secondary | ICD-10-CM

## 2010-05-27 ENCOUNTER — Other Ambulatory Visit: Payer: Self-pay | Admitting: Internal Medicine

## 2010-05-27 ENCOUNTER — Ambulatory Visit (INDEPENDENT_AMBULATORY_CARE_PROVIDER_SITE_OTHER): Payer: BC Managed Care – PPO | Admitting: Internal Medicine

## 2010-05-27 ENCOUNTER — Encounter: Payer: Self-pay | Admitting: Internal Medicine

## 2010-05-27 DIAGNOSIS — R0902 Hypoxemia: Secondary | ICD-10-CM

## 2010-05-27 DIAGNOSIS — R5383 Other fatigue: Secondary | ICD-10-CM

## 2010-05-27 DIAGNOSIS — E785 Hyperlipidemia, unspecified: Secondary | ICD-10-CM

## 2010-05-27 DIAGNOSIS — R5381 Other malaise: Secondary | ICD-10-CM | POA: Insufficient documentation

## 2010-05-27 DIAGNOSIS — J209 Acute bronchitis, unspecified: Secondary | ICD-10-CM

## 2010-05-27 LAB — TSH: TSH: 0.83 u[IU]/mL (ref 0.35–5.50)

## 2010-05-27 LAB — HEPATIC FUNCTION PANEL
ALT: 26 U/L (ref 0–53)
Total Protein: 6.5 g/dL (ref 6.0–8.3)

## 2010-05-27 LAB — CBC WITH DIFFERENTIAL/PLATELET
Basophils Relative: 0.3 % (ref 0.0–3.0)
Hemoglobin: 14.1 g/dL (ref 13.0–17.0)
Lymphocytes Relative: 14.7 % (ref 12.0–46.0)
Monocytes Relative: 8.7 % (ref 3.0–12.0)
Neutro Abs: 7.7 10*3/uL (ref 1.4–7.7)
RBC: 4.23 Mil/uL (ref 4.22–5.81)

## 2010-05-27 LAB — BASIC METABOLIC PANEL
BUN: 18 mg/dL (ref 6–23)
CO2: 32 mEq/L (ref 19–32)
Chloride: 103 mEq/L (ref 96–112)
Creatinine, Ser: 0.8 mg/dL (ref 0.4–1.5)

## 2010-05-27 LAB — LIPID PANEL
Cholesterol: 158 mg/dL (ref 0–200)
Total CHOL/HDL Ratio: 4
Triglycerides: 104 mg/dL (ref 0.0–149.0)

## 2010-05-31 NOTE — Assessment & Plan Note (Signed)
Summary: cough and congestion/nta   Vital Signs:  Patient profile:   77 year old male Weight:      177.8 pounds BMI:     23.70 O2 Sat:      87 % on Room air Temp:     99.1 degrees F oral Pulse rate:   84 / minute Resp:     15 per minute BP sitting:   110 / 60  (left arm) Cuff size:   large  Vitals Entered By: Shonna Chock CMA (May 23, 2010 10:47 AM)  O2 Flow:  Room air CC: Productive cough( dark colored) and congestion x 2 weeks , URI symptoms   CC:  Productive cough( dark colored) and congestion x 2 weeks  and URI symptoms.  History of Present Illness:    onset 2 weeks ago as ST; his wife has been ill with RTI. he now  reports sore throat and productive cough, but denies nasal congestion, purulent nasal discharge, and earache.  Associated symptoms include dyspnea and wheezing.  The patient denies fever.  The patient also reports R temporal headache.  The patient denies the following risk factors for Strep sinusitis: unilateral facial pain, tooth pain, and tender adenopathy.    Current Medications (verified): 1)  Benazepril Hcl 40 Mg  Tabs (Benazepril Hcl) .... 1/2 Tab Daily 2)  Fluoxetine Hcl 10 Mg Caps (Fluoxetine Hcl) .[name].. 1 By Mouth Qd 3)  Omeprazole 20 Mg  Tbec (Omeprazole) .[name].. 1 By Mouth Bid 4)  Fish Oil 4 Grams Daily 5)  Multivitamin 6)  Vit C 2 Tabs Daily 7)  Oscal 500mg  Plus D 8)  Tylenol Prn 9)  Simvastatin 40 Mg Tabs (Simvastatin) .[name].. 1 At Bedtime. **labs Due Now**  Allergies (verified): No Known Drug Allergies  Physical Exam  General:  Thin,in no acute distress; alert,appropriate and cooperative throughout examination Ears:  External ear exam shows no significant lesions or deformities.  Otoscopic examination reveals clear canals, tympanic membranes are intact bilaterally without bulging, retraction, inflammation or discharge. Hearing is grossly normal bilaterally. Nose:  External nasal examination shows no deformity or inflammation. Nasal mucosa are pink  and moist without lesions or exudates. Mouth:  Oral mucosa and oropharynx without lesions or exudates.  Teeth in good repair. Lungs:  Normal respiratory effort, chest expands symmetrically but excursion decreased. Lungs are  clear but quiet. NP cough Heart:  Normal rate and regular rhythm. S1 and S2 normal without gallop, murmur, click, rub ,S4 Extremities:  No clubbing, cyanosis, edema. Cervical Nodes:  No lymphadenopathy noted Axillary Nodes:  No palpable lymphadenopathy   Impression & Recommendations:  Problem # 1:  BRONCHITIS-ACUTE (ICD-466.0)  in context of COAD  Orders: Prescription Created Electronically [id number]) T-2 View CXR (71020TC)  His updated medication list for this problem includes:    Azithromycin 250 Mg Tabs (Azithromycin) .[name]... As per pack    Advair Diskus 250-50 Mcg/dose Aepb (Fluticasone-salmeterol) .[name]... 1 inhalation every 12 hrs ; gargle & spit after use  Problem # 2:  HYPOXEMIA (ICD-799.02)  O2 sats 87%  Orders: Prescription Created Electronically [id number]) T-2 View CXR (71020TC)  Complete Medication List: 1)  Benazepril Hcl 40 Mg Tabs (Benazepril hcl) .... 1/2 tab daily 2)  Fluoxetine Hcl 10 Mg Caps (Fluoxetine hcl) .[name].. 1 by mouth qd 3)  Omeprazole 20 Mg Tbec (Omeprazole) .[name].. 1 by mouth bid 4)  Fish Oil 4 Grams Daily  5)  Multivitamin  6)  Vit C 2 Tabs Daily  7)  Oscal 500mg  Plus D  8)  Tylenol Prn  9)  Simvastatin 40 Mg Tabs (Simvastatin) .[name].. 1 at bedtime. **labs due now** 10)  Azithromycin 250 Mg Tabs (Azithromycin) .... As per pack 11)  Prednisone 20 Mg Tabs (Prednisone) .[name].. 1 two times a day with a meal 12)  Advair Diskus 250-50 Mcg/dose Aepb (Fluticasone-salmeterol) .[name].. 1 inhalation every 12 hrs ; gargle & spit after use  Patient Instructions: 1)  Drink as much fluid as you can tolerate for the next few days. 2)  Please schedule a follow-up appointment in 5 days; to ER if breathing gets worse with this note. Prescriptions: ADVAIR DISKUS  250-50 MCG/DOSE AEPB (FLUTICASONE-SALMETEROL) 1 inhalation every 12 hrs ; gargle & spit after use  #1 x 0   Entered and Authorized by:   Marga Melnick MD   Signed by:   Marga Melnick MD on 05/23/2010   Method used:   Samples Given   RxID:   [id number] PREDNISONE 20 MG TABS (PREDNISONE) 1 two times a day with a meal  #14 x 0   Entered and Authorized by:   Marga Melnick MD   Signed by:   Marga Melnick MD on 05/23/2010   Method used:   Electronically to        [facility] [phone]* (retail)       [address]       [place], [place]  [address]       Ph: [phone] or [phone]       Fax: [phone]   RxID:   [id number] AZITHROMYCIN 250 MG TABS (AZITHROMYCIN) as per pack  #1 x 0   Entered and Authorized by:   Marga Melnick MD   Signed by:   Marga Melnick MD on 05/23/2010   Method used:   Electronically to        [facility] [phone]* (retail)       [address]       [place], [place]  [address]       Ph: [phone] or [phone]       Fax: [phone]   RxID:   [id number]    Orders Added: 1)  Prescription Created Electronically [[id number]] 2)  Est. Patient Level III [61607] 3)  T-2 View CXR [71020TC]

## 2010-05-31 NOTE — Assessment & Plan Note (Signed)
Summary: 5 day followup, needs labs?///sph   Vital Signs:  Patient profile:   77 year old male Weight:      176.8 pounds BMI:     23.57 O2 Sat:      97 % on Room air Temp:     98.0 degrees F oral Pulse rate:   85 / minute Resp:     15 per minute BP sitting:   130 / 82  (left arm) Cuff size:   large  Vitals Entered By: Shonna Chock CMA (May 27, 2010 8:21 AM)  O2 Flow:  Room air CC: Follow-up visit: patient states he is better but still with dry cough, URI symptoms Comments 1 day of antibiotic left   CC:  Follow-up visit: patient states he is better but still with dry cough and URI symptoms.  History of Present Illness:    Cough is 60% better;he now  denies nasal congestion, purulent nasal discharge, and productive cough.  The patient denies fever, dyspnea, and wheezing.  The patient also reports  fatigue by noon ( X 1 year).  The patient denies headache.  The patient denies the following risk factors for Strep sinusitis: unilateral facial pain and tender adenopathy.    Current Medications (verified): 1)  Benazepril Hcl 40 Mg  Tabs (Benazepril Hcl) .... 1/2 Tab Daily 2)  Fluoxetine Hcl 10 Mg Caps (Fluoxetine Hcl) .[name].. 1 By Mouth Qd 3)  Omeprazole 20 Mg  Tbec (Omeprazole) .[name].. 1 By Mouth Bid 4)  Fish Oil 4 Grams Daily 5)  Multivitamin 6)  Vit C 2 Tabs Daily 7)  Oscal 500mg  Plus D 8)  Tylenol Prn 9)  Simvastatin 40 Mg Tabs (Simvastatin) .[name].. 1 At Bedtime. **labs Due Now** 10)  Azithromycin 250 Mg Tabs (Azithromycin) .... As Per Pack 11)  Prednisone 20 Mg Tabs (Prednisone) .[name].. 1 Two Times A Day With A Meal 12)  Advair Diskus 250-50 Mcg/dose Aepb (Fluticasone-Salmeterol) .[name].. 1 Inhalation Every 12 Hrs ; Gargle & Spit After Use  Allergies (verified): No Known Drug Allergies  Physical Exam  General:  well-nourished,in no acute distress; alert,appropriate and cooperative throughout examination Ears:  External ear exam shows no significant lesions or deformities.  Otoscopic  examination reveals clear canals, tympanic membranes are intact bilaterally without bulging, retraction, inflammation or discharge. Hearing is grossly normal bilaterally. Nose:  External nasal examination shows no deformity or inflammation. Nasal mucosa are pink and moist without lesions or exudates. Mouth:  Oral mucosa and oropharynx without lesions or exudates.  Partials Neck:  No deformities, masses, or tenderness noted. Lungs:  Normal respiratory effort, chest expands symmetrically. Lungs are clear to auscultation, no crackles or wheezes. BS decreased ;intermittent dry cough Heart:  Normal rate and regular rhythm. S1 and S2 normal without gallop, murmur, click, rub. Neurologic:  alert & oriented X3 and DTRs symmetrical and normal.   Skin:  Intact without suspicious lesions or rashes Cervical Nodes:  No lymphadenopathy noted Axillary Nodes:  No palpable lymphadenopathy   Impression & Recommendations:  Problem # 1:  BRONCHITIS-ACUTE (ICD-466.0)  improved;? subclinical  RAD component His updated medication list for this problem includes:    Azithromycin 250 Mg Tabs (Azithromycin) .[name]... As per pack    Advair Diskus 250-50 Mcg/dose Aepb (Fluticasone-salmeterol) .[name]... 1 inhalation every 12 hrs ; gargle & spit after use  Orders: Prescription Created Electronically ([phone])  Problem # 2:  HYPOXEMIA (ICD-799.02) resolved  Problem # 3:  FATIGUE (ICD-780.79)  Orders: Venipuncture (78295) TLB-TSH (Thyroid Stimulating Hormone) (84443-TSH) TLB-CBC  Platelet - w/Differential (85025-CBCD) TLB-BMP (Basic Metabolic Panel-BMET) (80048-METABOL)  Complete Medication List: 1)  Benazepril Hcl 40 Mg Tabs (Benazepril hcl) .... 1/2 tab daily 2)  Fluoxetine Hcl 10 Mg Caps (Fluoxetine hcl) .[name].. 1 by mouth qd 3)  Omeprazole 20 Mg Tbec (Omeprazole) .[name].. 1 by mouth bid 4)  Fish Oil 4 Grams Daily  5)  Multivitamin  6)  Vit C 2 Tabs Daily  7)  Oscal 500mg  Plus D  8)  Tylenol Prn  9)  Simvastatin 40 Mg  Tabs (Simvastatin) .[name].. 1 at bedtime. **labs due now** 10)  Azithromycin 250 Mg Tabs (Azithromycin) .... As per pack 11)  Prednisone 20 Mg Tabs (Prednisone) .[name].. 1 two times a day with a meal 12)  Advair Diskus 250-50 Mcg/dose Aepb (Fluticasone-salmeterol) .[name].. 1 inhalation every 12 hrs ; gargle & spit after use 13)  Prednisone 20 Mg Tabs (Prednisone) .[name].. 1 two times a day with meals  Other Orders: TLB-Hepatic/Liver Function Pnl (80076-HEPATIC) TLB-Lipid Panel (80061-LIPID)  Patient Instructions: 1)  Use Advair samples. 2)  Drink as much fluid as you can tolerate for the next few days. Prescriptions: PREDNISONE 20 MG TABS (PREDNISONE) 1 two times a day with meals  #14 x 0   Entered and Authorized by:   Marga Melnick MD   Signed by:   Marga Melnick MD on 05/27/2010   Method used:   Electronically to        [facility] [phone]* (retail)       [address]       [place], [place]  [address]       Ph: [phone] or [phone]       Fax: [phone]   RxID:   [id number]    Orders Added: 1)  Est. Patient Level III [38756] 2)  Venipuncture [43329] 3)  TLB-TSH (Thyroid Stimulating Hormone) [84443-TSH] 4)  TLB-CBC Platelet - w/Differential [85025-CBCD] 5)  TLB-BMP (Basic Metabolic Panel-BMET) [80048-METABOL] 6)  TLB-Hepatic/Liver Function Pnl [80076-HEPATIC] 7)  TLB-Lipid Panel [80061-LIPID] 8)  Prescription Created Electronically [[id number]]  Appended Document: 5 day followup, needs labs?///sph

## 2010-06-09 ENCOUNTER — Other Ambulatory Visit: Payer: Self-pay | Admitting: Internal Medicine

## 2010-06-23 ENCOUNTER — Other Ambulatory Visit: Payer: Self-pay | Admitting: *Deleted

## 2010-06-23 MED ORDER — AMBULATORY NON FORMULARY MEDICATION: Status: DC

## 2010-06-23 NOTE — Telephone Encounter (Signed)
Pt aware sent to pharmacy 

## 2010-06-27 ENCOUNTER — Ambulatory Visit (INDEPENDENT_AMBULATORY_CARE_PROVIDER_SITE_OTHER): Payer: BC Managed Care – PPO | Admitting: Internal Medicine

## 2010-06-27 ENCOUNTER — Encounter: Payer: Self-pay | Admitting: Internal Medicine

## 2010-06-27 DIAGNOSIS — M48061 Spinal stenosis, lumbar region without neurogenic claudication: Secondary | ICD-10-CM

## 2010-06-27 DIAGNOSIS — M549 Dorsalgia, unspecified: Secondary | ICD-10-CM

## 2010-06-27 MED ORDER — GABAPENTIN 100 MG PO CAPS: 100.0000 mg | ORAL_CAPSULE | Freq: Three times a day (TID) | ORAL | Status: DC

## 2010-06-27 NOTE — Progress Notes (Signed)
  Subjective:    Patient ID: Ronald Stafford, male    DOB: 10/04/1930, 77 y.o.   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   Surgical correction by Dr. Shon Baton is planned sometime after early July.  It is being delayed because of his wife's chemotherapy appointments as well as family commitments involving their anniversary.                                                                              Review of Systems     Objective:   Physical Exam he is in no acute distress; he appears younger than stated age.  There are no significant skin lesions. He does have a large lipoma in the  lumbar spine area.  There is no definite curvature to the spine although there is slight asymmetry of the thoracic muscles. The right musculature appears slightly larger than the left.  Abdomen reveals no organomegaly or masses.  He is able to lie supine and rise from exam table without help.  He has no pain to percussion over the lumbosacral spine or flanks.  Degenerative reflexes and strength are normal.  Gait appears grossly normal. He can walk on his tiptoes but cannot walk on his heels.        Assessment & Plan:  #1 lumbar spinal stenosis with lumbosacral radiculopathy.   Plan: #1 trial of tramadol 50 mg every 6 hours as  needed for pain. His condition is incapacitating; he's been asked to decide when the surgery can be completed .

## 2010-06-27 NOTE — Patient Instructions (Addendum)
Assess response to  Gabapentin 100 mg every 8 hrs as needed.

## 2010-07-26 NOTE — H&P (Signed)
NAMEBINYOMIN, BRANN NO.:  [id number]   MEDICAL RECORD NO.:  [id number]          PATIENT TYPE:  EMS   LOCATION:  ED                           FACILITY:  [facility]   PHYSICIAN:  Valerie A. Felicity Coyer, MDDATE OF BIRTH:  [birth year]   DATE OF ADMISSION:  12/18/2006  DATE OF DISCHARGE:                              HISTORY & PHYSICAL   CHIEF COMPLAINT:  Chest pain.   HISTORY OF PRESENT ILLNESS:  The patient is a 77 year old pleasant white  gentleman without known history of coronary artery disease, but positive  hypertension and positive dyslipidemia, who presents to the [facility]  Emergency Room today with multiple complaints.  He has noticed fatigue  with exertion for the last 2 months, or at the end of the day.  He says  he just has no energy, which is a change from his usual.  Also last  week, he noted left-sided chest pain beneath his breast with left arm  and shoulder tingling.  This sensation was transient, but associated  with nausea, not clearly related, however, to exertion and not relieved  with rest, as it would occur at any time of the day.  There was no  associated shortness of breath or pleurisy.  He does occasionally have a  dry cough, but no lower extremity swelling, no fevers or sweats, but  does say he is chilled also at the end of the day with his fatigue.  He  has noticed increase in his reflux symptoms, despite use of Protonix,  and says that he has to sit truly upright after big meals and even after  small meals, no vomiting or regurgitation noted.  No odynophagia.   PAST MEDICAL HISTORY:  1. Advanced prostate cancer, status post a radical prostatectomy in      November 2004 and radiation in 2005.  PSA currently undetectable      per a recent note from Dr. Isabel Caprice.  2. Status post left total hip replacement secondary to fracture the      patient reports in 1997 with associated PE at that time.  3.      Hypertension.  3. Dyslipidemia.  4.  Depression.   CURRENT MEDICATIONS:  1. Vytorin 10/10 nightly.  2. Benazepril 40-mg tablets; he takes 1/2 tablet daily.  3. Protonix 40 mg daily.  4. Paroxetine 10 mg daily.  5. Aspirin 325 mg daily.  6. Fish oil 1-g capsules, 2 capsules b.i.d.  7. Vitamin E supplement daily.  8. Multivitamin daily.  9. Vitamin C daily.  10.Calcium 500 mg plus D b.i.d.   ALLERGIES:  NO KNOWN DRUG ALLERGIES.   FAMILY HISTORY:  Mother died at age 60 due to old age.  Father died in  his 80s due to prostate cancer.  He had a sister who passed away in her  sleep suddenly at the age of 29 from unknown causes.  No other known  history of coronary artery disease or sudden death.   SOCIAL HISTORY:  He lives with his wife, who herself had a history of  ovarian and breast cancer.  He has not smoked in the last 25-30 years.  He does not drink alcohol at all.  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   REVIEW OF SYSTEMS:  Please see HPI above, increased reflux symptoms as  noted.  He also notes loose stools, diarrhea, for the past 2 weeks not  occurring daily, but will have a loose bowel movement once or twice a  day for 2 or 3 days and subsequent repetition, then have constipation  for 2 or 3 days before the loose pattern resumes, no blood, no mucus.  He also note that he had a virus at the end of last week, October 3  and October 4, when he stayed in bed due to fatigue, but felt fine, in  his normal state of health Sunday, October 5.   PHYSICAL EXAM:  VITAL SIGNS:  Temperature 97, blood pressure 138/74,  pulse of 78, respirations 18, saturating 96% on room air.  GENERAL:  He is a pleasant white man who is in no acute distress,  articulate with good insight.  HEENT:  Unremarkable.  NECK:  Supple.  No JVD, lymphadenopathy or goiter.  LUNGS:  Show a few left base crackles with initial inspiration, but  these clear  with continued deep breathing, no wheeze or crackles, good  air movement bilaterally.  CARDIOVASCULAR:  Regular rate and rhythm, no murmurs or rubs.  ABDOMEN: Soft and nontender with good bowel sounds.  EXTREMITIES:  Show no edema or swelling.  NEUROLOGICAL:  He is awake, alert, oriented x4 and fully intact.   LABORATORY DATA:  Unremarkable with a normal CBC, normal basic  metabolic, normal PT and PTT and negative cardiac point-of-care enzymes  x1.   Two-view chest x-ray with no acute disease.   EKG shows normal sinus rhythm at 75 beats per minute with no ST or T  changes.   ASSESSMENT AND PLAN:  1. Chest pain.  I doubt this is true angina, but there are concerning      risk factors such as hypertension and dyslipidemia, as well as left      shoulder and arm pain with nausea.  The initial emergency room      evaluation is negative, but we will admit at this time for      observation to telemetry to cycle cardiac enzymes and monitor for      recurrence of chest pain.  The patient says his pain is currently a      1/10 on the left side of his chest, which is negligible per his      report.  We will also check a D-dimer to pursue CT chest angiogram      if positive D-dimer, given his history of prostate cancer (in      remission for 3 years) and previous history of pulmonary embolus      (associated with hip fracture several years ago).  We will treat      with full-dose Lovenox until the above rule-out myocardial      infarction and pulmonary embolus are negative.  Continue treatment      with aspirin as well as low-dose beta blocker if tolerated by heart      rate and pressure.  If negative workup and chest pain symptoms      remain resolved, we will continue treatment for gastroesophageal      reflux disease symptoms  with b.i.d. PPI and pursue outpatient      cardiac stress testing plus/minus further gastrointestinal      evaluation.  Please see orders in this regard.  2.  Hypertension history.  We will add low-dose beta blocker to home      ACE inhibitor dosing if tolerated by blood pressure and heart rate;      please see orders.  3. Dyslipidemia.  Continue home Vytorin and recheck fasting lipid      profile in the morning.  4. Gastroesophageal reflux disease symptoms.  Please see problem #1.      Will change proton pump inhibitor to twice-daily dosing.  5. History of advanced prostate cancer, status post radical      prostatectomy in 2001 with radiation therapy      treatment, last PSA undetectable per Dr. Ellin Goodie note from this      summer, outpatient followup as previously scheduled.  6. History of depression, no active symptoms.  Continue home Prozac as      prior to admission.      Valerie A. Felicity Coyer, MD  Electronically Signed     VAL/MEDQ  D:  12/18/2006  T:  12/19/2006  Job:  [id number]

## 2010-07-26 NOTE — Discharge Summary (Signed)
NAME:  Ronald Stafford, Ronald Stafford NO.:  [id number]   MEDICAL RECORD NO.:  [id number]          PATIENT TYPE:  OBV   LOCATION:  1420                         FACILITY:  [facility]   PHYSICIAN:  Valerie A. Felicity Coyer, MDDATE OF BIRTH:  07-06-1930   DATE OF ADMISSION:  12/18/2006  DATE OF DISCHARGE:  12/19/2006                               DISCHARGE SUMMARY   DISCHARGE DIAGNOSES:  1. Chest pain, rule out myocardial infarction, negative, to pursue      outpatient follow-up.  2. History of advanced prostate cancer currently in remission,      outpatient urology follow-up with Dr. Isabel Caprice.  3. Gastroesophageal reflux disease symptoms, increase therapy to      b.i.d., see below.  4. Hypertension, continue home treatment.  5. Dyslipidemia.  6. History of depression, controlled.   DISCHARGE MEDICATIONS:  Include the change of Protonix to 40 mg p.o.  b.i.d. (previously daily) and other medications are as prior to  admission and include:  1. Aspirin 325 mg once daily.  2. Vytorin 10/10 every night.  3. Benazepril 20 mg daily.  4. Fluoxetine 10 mg daily.  5. Multiple supplements including fish oil, vitamin E, vitamin C,      multivitamin and calcium as prior to admission.   HOSPITAL FOLLOW-UP:  Is scheduled for Monday, October 13 at 12 noon at  [facility] for a treadmill stress test.  He is instructed to  have no caffeine after midnight and no food or drink after 8:00 a.m. on  the morning of the test.  After this test on Monday, he will follow up  with his primary care physician, Dr. Marga Melnick, Tuesday, October 14  at 8:15 a.m. to review results of this test as well as management of his  GERD symptoms and arrange further evaluation as-needed.  This plan has  been reviewed with the patient who expresses understanding and agrees to  follow-up as noted.   CONDITION ON DISCHARGE:  Medically stable and improved.  Chest pain  free.   HOSPITAL COURSE BY PROBLEM:  Chest pain.   The patient is a 77 year old  gentleman with past medical history as noted above who came to the  emergency room complaining of on and off left-sided chest discomfort  associated with arm tingling.  Initially evaluation in the emergency  room was unremarkable including a negative EKG and negative point-of-  care enzymes and negative D-dimer.  Due to his age as well as history of  hypertension and dyslipidemia, he was placed on overnight observation  for telemetry to rule out arrhythmia and cycle cardiac enzymes.  He had  one mild recurrence of left-sided chest stabbing around 1:00 a.m. during  his overnight stay.  This resolved in a few minutes and which he  believes is attributed to an increase in his reflux symptoms.  Cardiac  enzymes on admission were negative and a follow-up set pending at the  time of this dictation.  Presuming that this set is also negative, the  patient will be discharged home today to pursue outpatient stress  testing as noted  above with instructions to return to the emergency room  or call M.D. if worsening pain or symptoms prior to stress test.  He was  also encouraged to increase his proton-pump inhibitors twice daily for  his GERD symptoms and will follow up with his primary M.D. on both of  these issues.  He will otherwise continue his other routine medical  management  including aspirin daily, hypertension control and statin combined  therapy for dyslipidemia.  Fasting lipid profile was checked this  hospitalization and was within limits with a notable exception of an HDL  low at 34.  Further follow-up with primary M.D. as instructed.      Valerie A. Felicity Coyer, MD  Electronically Signed     VAL/MEDQ  D:  12/19/2006  T:  12/19/2006  Job:  [id number]

## 2010-07-29 NOTE — H&P (Signed)
NAME:  CHING, RABIDEAU                            ACCOUNT NO.:  [id number]   MEDICAL RECORD NO.:  [id number]                   PATIENT TYPE:  INP   LOCATION:  X001                                 FACILITY:  [facility]   PHYSICIAN:  Valetta Fuller, M.D.               DATE OF BIRTH:  [birth year]   DATE OF ADMISSION:  01/21/2003  DATE OF DISCHARGE:                                HISTORY & PHYSICAL   ADMISSION DIAGNOSIS:  Clinical stage II 1c adenocarcinoma of the prostate.   HISTORY OF PRESENT ILLNESS:  Mr. Smethurst is a 77 year old male. He was noted to  have an elevated PSA of approximately 4.4. He was under the care of Dr.  Derrell Lolling. Sural and underwent an ultrasound and biopsy of his prostate. The  patient's ultrasound revealed approximately a 50-g prostate. The left sided  biopsies were positive for Gleason 3 + 4 = 7 adenocarcinoma of the prostate  involving approximately 10% of the biopsy material. The right sided biopsies  were negative. The patient underwent extensive discussion with Dr. Etta Grandchild and  also Dr. Ballard Russell in radiation oncology. He was strongly considering a  radiation approach but elevated to have a second opinion. He came to see me,  and we discussed things in significant detail. He was 77 years of age but  enjoys good health. We felt that a radial retropubic prostatectomy or  radiation approach were quite reasonable in his situation. We spent  approximately an hour going over the advantages and disadvantages of both  approaches, and he elected to proceed with surgical intervention. He has  moderate erectile dysfunction at this point, and sexual function is not  terribly important to him. We will plan on a unilateral nerve sparing to  allow him potential opportunity for erectile activity down the road. He  really has little in the way of voiding symptoms, and certainly has no  symptoms suggestive of advanced prostate cancer. He is essentially  asymptomatic.   PAST MEDICAL  HISTORY:  Otherwise relatively unremarkable. He does have  routine exams with Dr. Marga Melnick. He does not have any significant  medical problems other than some mild hypertension. He does have a previous  history of DVT after hip replacement surgery with questionable pulmonary  embolus.   CURRENT MEDICATIONS:  Verapamil, glucosamine, vitamin supplements.   DRUG ALLERGIES:  He has no drug allergies.   SOCIAL HISTORY:  He has a minimal tobacco use history of approximately 10  pack years and quit over 15 years ago. He does not consume alcohol.   REVIEW OF SYSTEMS:  Really negative for significant voiding complaints,  abdominal pain, bony discomfort.   PHYSICAL EXAMINATION:  GENERAL:  He is a well-developed, well-nourished  male. His BP was 140/68 with a pulse of 88. He is approximately 200 pounds.  Room air saturation was 96%.  NECK:  Shows no JVD.  CHEST:  Clear to auscultation.  ABDOMEN:  Soft and flat without obvious masses.  EXTERNAL GENITALIA:  Shows normal penis, scrotum, testes, and adnexal  structures. His prostate is 1 to 2+ without any abnormalities.  EXTREMITIES:  Show no edema.   LABORATORY DATA:  CBC and BMET within normal limits.   ASSESSMENT:  Clinical stage II 1c adenocarcinoma of the prostate. The  patient is going to undergo pelvic lymph node dissection and radical  retropubic prostatectomy today. He will be hopefully admitted for routine  postoperative care.                                               Valetta Fuller, M.D.    DSG/MEDQ  D:  01/21/2003  T:  01/21/2003  Job:  [id number]   cc:   Titus Dubin. Alwyn Ren, M.D. [facility]

## 2010-07-29 NOTE — Op Note (Signed)
NAME:  Ronald Stafford, Ronald Stafford                            ACCOUNT NO.:  [id number]   MEDICAL RECORD NO.:  [id number]                   PATIENT TYPE:  INP   LOCATION:  X001                                 FACILITY:  [facility]   PHYSICIAN:  Valetta Fuller, M.D.               DATE OF BIRTH:  [birth year]   DATE OF PROCEDURE:  01/21/2003  DATE OF DISCHARGE:                                 OPERATIVE REPORT   PREOPERATIVE DIAGNOSIS:  Clinical stage T1C adenocarcinoma of the prostate.   POSTOPERATIVE DIAGNOSIS:  Clinical stage T1C adenocarcinoma of the prostate.   PROCEDURE PERFORMED:  Pelvic lymph node dissection, radical retropubic  prostatectomy.   SURGEON:  Valetta Fuller, M.D.   ASSISTANT:  Susanne Borders, MD   ANESTHESIA:  General endotracheal.   INDICATIONS FOR PROCEDURE:  Ronald Stafford is a 77 year old male with recently  diagnosed biopsy proved stage T1C adenocarcinoma of the prostate. He has  left sided biopsies positive for a Gleason 3+4=7 tumor. PSA was 7. The  patient underwent extensive counseling by two urologists and a radiation  oncologist. He elected to have pelvic lymph node dissection and radical  retropubic prostatectomy. The patient appeared to understand all the issues  with regard to complications, advantages and disadvantages of a surgical  approach to the management of this problem. He presents now for his surgery.   TECHNIQUE:  The patient was brought to the operating room where he had  successful induction of general endotracheal anesthesia. He was in the  straight supine position and prepped and draped in the usual manner. On a  sterile field, a Foley catheter was inserted and the bladder drained. A  standard lower midline incision was performed. The retropubic space was  entered and bluntly dissected to expose the pelvic sidewall. A standard  pelvic lymph node dissection was performed primarily concentrating on the  obturator packet and dissecting the tissue up to the  bifurcation of the  iliac artery. There was no obvious palpable adenopathy bilaterally. On both  sides, small veins, vessels, lymphatics were clipped or cauterized. The  obturator nerves were identified bilaterally and preserved. The packets were  sent separately.   Attention was then turned to the prostatectomy. An Omni retractor was  utilized for good exposure. The endopelvic fascia was incised bilaterally.  With careful dissective technique, we were able to identify both  puboprostatic ligaments which were then sharply dissected. A right angled  clamp was passed between the dorsal vein complex and the underlying urethra  and this was then tied with #1 Vicryl suture. The dorsal vein complex was  transected. There was some oozing from this complex and this was oversewn  with Vicryl suture. The underlying urethra was identified. The patient did  want a unilateral nerve sparing. We carefully dissected the urethra away  from the lateral tissue and the periurethral area where the neurovascular  bundles do run.  The urethral stump was then encircled with a right angled  clamp and transected anteriorly. The Foley catheter was then brought out the  wound and the posterior aspect of the urethra was then transected. There was  some rectal urethralis tissue and quite a bit of thickening of  Denonvillier's fascia. This was consistent with some old bleeding or  hematoma as there was almost an inflammatory rind really through  Denonvilliers fascia. The posterior plane was established without  difficulty. The endopelvic fascia was taken down laterally and we performed  nerve sparing of the right neurovascular bundle. On the left side, this was  taken widely. The pedicles were identified and either tied or clipped with  Hem-o-lok  clips. The vas deferens were then identified in the midline,  clipped and separated from the seminal vesicles which were then taken out by  clipping the tip. At that point,  the bladder neck was the only remaining  attachment. Utilizing a combination of sharp and blunt dissective technique,  the bladder neck was completely preserved and the total specimen was  removed. The bladder neck did not require any reconstruction. The mucosa was  everted with some 4-0 Vicryl suture.   Attention was then turned to the anastomosis. A male urethral sound was  utilized to identify the urethral stump. 2-0 Vicryl sutures were placed  posteriorly, laterally and also at the anterior position. These were then  placed in the bladder neck at the corresponding positions. This was done  over a 22 [place] Foley. The whole pelvis was copiously irrigated. There had  been some oozing and blood loss at this point was approximately 1200 mL. At  that juncture, however, hemostasis was quite good. The anastomotic sutures  were then tied. Irrigation of the bladder revealed no obvious leakage. The  pelvis was once again irrigated. A separate stab incision was used for a  Jackson-Pratt drain placed in the pelvis overlying the anastomotic area. The  fascia was closed with a running #1 PDS and the skin was closed with clips.  The patient appeared to tolerate the procedure well. There are no obvious  complications.                                               Valetta Fuller, M.D.    DSG/MEDQ  D:  01/21/2003  T:  01/21/2003  Job:  [id number]

## 2010-08-05 ENCOUNTER — Other Ambulatory Visit: Payer: Self-pay | Admitting: Internal Medicine

## 2010-08-09 ENCOUNTER — Other Ambulatory Visit: Payer: Self-pay | Admitting: Internal Medicine

## 2010-08-09 MED ORDER — BENAZEPRIL HCL 40 MG PO TABS: 40.0000 mg | ORAL_TABLET | Freq: Every day | ORAL | Status: DC

## 2010-08-25 ENCOUNTER — Other Ambulatory Visit: Payer: Self-pay | Admitting: Internal Medicine

## 2010-09-17 ENCOUNTER — Encounter: Payer: Self-pay | Admitting: Internal Medicine

## 2010-09-21 ENCOUNTER — Ambulatory Visit (INDEPENDENT_AMBULATORY_CARE_PROVIDER_SITE_OTHER): Payer: Medicare Other | Admitting: Internal Medicine

## 2010-09-21 ENCOUNTER — Ambulatory Visit: Payer: BC Managed Care – PPO | Admitting: Internal Medicine

## 2010-09-21 ENCOUNTER — Encounter: Payer: Self-pay | Admitting: Internal Medicine

## 2010-09-21 DIAGNOSIS — Z0000 Encounter for general adult medical examination without abnormal findings: Secondary | ICD-10-CM

## 2010-09-21 DIAGNOSIS — I10 Essential (primary) hypertension: Secondary | ICD-10-CM

## 2010-09-21 DIAGNOSIS — E785 Hyperlipidemia, unspecified: Secondary | ICD-10-CM

## 2010-09-21 DIAGNOSIS — Z86718 Personal history of other venous thrombosis and embolism: Secondary | ICD-10-CM

## 2010-09-21 DIAGNOSIS — Z01818 Encounter for other preprocedural examination: Secondary | ICD-10-CM

## 2010-09-21 DIAGNOSIS — K219 Gastro-esophageal reflux disease without esophagitis: Secondary | ICD-10-CM

## 2010-09-21 DIAGNOSIS — Z8546 Personal history of malignant neoplasm of prostate: Secondary | ICD-10-CM

## 2010-09-21 NOTE — Patient Instructions (Signed)
Your medically cleared for the surgery; please review these records and share them with all doctors seen.

## 2010-09-21 NOTE — Progress Notes (Signed)
Subjective:    Patient ID: Ronald Stafford, male    DOB: 1930-11-21, 78 y.o.   MRN: [id number]  HPI Pre op evaluation: Surgical diagnosis:Spinal Stenosis with numbness, tingling, and pain in both lower extremities.  Tentative surgical date/Surgeon:  09/28/2010 Severity:up to 10 Pain:aching  Activity of daily living limitation/impairment of function: He must stand flexed at the waist; he can stand for no longer than 35-60 minutes at a time. This has  affected his balance and gait and he must use a cane for ambulation. Treatment to date, efficacy: He has had 4 epidural steroid injections in the last 3-6 months. Relief is only temporary. Significant past medical history: No Diabetes or coronary disease. He did have deep vein thrombosis following hip replacement 1997. He has not had recurrence despite hip replacement in 2001 and prostate surgery 2004.    Review of Systems HYPERTENSION: Disease Monitoring  Blood pressure range: not monitored  Chest pain: no   Dyspnea: no   Claudication: yes, Neurologic Claudication  Medication compliance: yes  Medication Side Effects  Lightheadedness: no   Urinary frequency: yes, since prostate surgery   Edema: no   Preventitive Healthcare:  Exercise: no, due to Spinal Stenosis   Diet Pattern: no plan  Salt Restriction: "light salt" as per wife   Dyslipidemia assessment:  Family history of premature CAD/ MI: no .  Smoking history  : quit age 80 .   Weight :  stable. ROS: fatigue: mild ;  abd pain/bowel changes: no ; myalgias:no;  syncope : no ; memory loss: no;skin changes: no. Lab results reviewed :labs reviewed for past 12 months.   Dr. Isabel Caprice  monitors his PSA; it was 0.00 border of this year. He denies dysuria, pyuria, or hematuria.     Objective:   Physical Exam Gen.: Healthy and well-nourished in appearance. Alert, appropriate and cooperative throughout exam. He appears younger than age Eyes: No corneal or conjunctival inflammation noted.No  icterus Neck: No deformities, masses, or tenderness noted. Thyroid normal. Lungs: Normal respiratory effort; chest expands symmetrically. Lungs are clear to auscultation without rales, wheezes, or increased work of breathing. Heart: Normal rate and rhythm. Normal S1 and S2. No gallop, click, or rub. S4 with slurring; no  murmur. Abdomen: Bowel sounds normal; abdomen soft and nontender. No masses, organomegaly or hernias noted. .                                                                                   Musculoskeletal/extremities: No deformity or scoliosis noted of  the thoracic or lumbar spine. No clubbing, cyanosis, edema, or deformity noted. Tone & strength  normal.Joints normal. Nail health  good. Vascular: Carotid, radial artery, dorsalis pedis and  posterior tibial pulses are full and equal. Faint left carotid bruit present. Neurologic: Alert and oriented x3. Deep tendon reflexes symmetrical and normal.          Skin: Intact without suspicious lesions or rashes. He has scattered cherry angiomata and lipomas. Lymph: No cervical, axillary  lymphadenopathy present. Psych: Mood and affect are normal. Normally interactive  Assessment & Plan:  #1 spinal stenosis with intractable symptoms affecting quality of life  #2 hypertension, controlled  #3 prostate cancer, past medical history. PSA undetectable  #4 dyslipidemia; controlled, see labs  #5 past medical history of deep venous thrombosis postop in 1997  Plan: He is medically cleared for surgery. Close monitor for DVT would be recommended. Lovenox coverage could be considered. perioperatively

## 2010-09-22 ENCOUNTER — Encounter: Payer: Self-pay | Admitting: Internal Medicine

## 2010-09-26 ENCOUNTER — Other Ambulatory Visit (HOSPITAL_COMMUNITY): Payer: Self-pay | Admitting: Orthopedic Surgery

## 2010-09-26 ENCOUNTER — Ambulatory Visit (HOSPITAL_COMMUNITY)
Admission: RE | Admit: 2010-09-26 | Discharge: 2010-09-26 | Disposition: A | Discharge status: Home / Self Care | Payer: Medicare Other | Source: Ambulatory Visit | Attending: Orthopedic Surgery | Admitting: Orthopedic Surgery

## 2010-09-26 ENCOUNTER — Encounter (HOSPITAL_COMMUNITY)
Admission: RE | Admit: 2010-09-26 | Discharge: 2010-09-26 | Disposition: A | Discharge status: Home / Self Care | Payer: Medicare Other | Source: Ambulatory Visit | Attending: Orthopedic Surgery | Admitting: Orthopedic Surgery

## 2010-09-26 ENCOUNTER — Other Ambulatory Visit (HOSPITAL_COMMUNITY): Payer: Medicare Other

## 2010-09-26 DIAGNOSIS — J984 Other disorders of lung: Secondary | ICD-10-CM | POA: Insufficient documentation

## 2010-09-26 DIAGNOSIS — I10 Essential (primary) hypertension: Secondary | ICD-10-CM | POA: Insufficient documentation

## 2010-09-26 DIAGNOSIS — Z01812 Encounter for preprocedural laboratory examination: Secondary | ICD-10-CM | POA: Insufficient documentation

## 2010-09-26 DIAGNOSIS — Z01818 Encounter for other preprocedural examination: Secondary | ICD-10-CM

## 2010-09-26 LAB — BASIC METABOLIC PANEL
CO2: 32 mEq/L (ref 19–32)
Chloride: 103 mEq/L (ref 96–112)
Creatinine, Ser: 0.81 mg/dL (ref 0.50–1.35)
GFR calc Af Amer: >60 mL/min (ref >60)
Sodium: 143 mEq/L (ref 135–145)

## 2010-09-26 LAB — CBC
MCV: 94.9 fL (ref 78.0–100.0)
Platelets: 133 10*3/uL — ABNORMAL LOW (ref 150–400)
RBC: 4.5 MIL/uL (ref 4.22–5.81)
RDW: 13.2 % (ref 11.5–15.5)
WBC: 5.9 10*3/uL (ref 4.0–10.5)

## 2010-09-26 LAB — SURGICAL PCR SCREEN: MRSA, PCR: NEGATIVE

## 2010-09-26 LAB — TYPE AND SCREEN
ABO/RH(D): A POS
Antibody Screen: NEGATIVE

## 2010-09-28 ENCOUNTER — Inpatient Hospital Stay (HOSPITAL_COMMUNITY)
Admission: RE | Admit: 2010-09-28 | Discharge: 2010-09-30 | DRG: 491 | Disposition: A | Discharge status: Home / Self Care | Payer: Medicare Other | Source: Ambulatory Visit | Attending: Orthopedic Surgery | Admitting: Orthopedic Surgery

## 2010-09-28 ENCOUNTER — Other Ambulatory Visit: Payer: Self-pay | Admitting: Orthopedic Surgery

## 2010-09-28 ENCOUNTER — Ambulatory Visit (HOSPITAL_COMMUNITY)
Admission: RE | Admit: 2010-09-28 | Discharge: 2010-09-28 | Disposition: A | Discharge status: Home / Self Care | Payer: Medicare Other | Source: Ambulatory Visit | Attending: Orthopedic Surgery | Admitting: Orthopedic Surgery

## 2010-09-28 ENCOUNTER — Inpatient Hospital Stay (HOSPITAL_COMMUNITY): Payer: Medicare Other

## 2010-09-28 ENCOUNTER — Other Ambulatory Visit (HOSPITAL_COMMUNITY): Payer: Self-pay | Admitting: Orthopedic Surgery

## 2010-09-28 DIAGNOSIS — I10 Essential (primary) hypertension: Secondary | ICD-10-CM | POA: Diagnosis present

## 2010-09-28 DIAGNOSIS — Z8546 Personal history of malignant neoplasm of prostate: Secondary | ICD-10-CM

## 2010-09-28 DIAGNOSIS — Z87891 Personal history of nicotine dependence: Secondary | ICD-10-CM

## 2010-09-28 DIAGNOSIS — M48062 Spinal stenosis, lumbar region with neurogenic claudication: Principal | ICD-10-CM | POA: Diagnosis present

## 2010-09-28 DIAGNOSIS — Z981 Arthrodesis status: Secondary | ICD-10-CM

## 2010-09-28 DIAGNOSIS — Z01812 Encounter for preprocedural laboratory examination: Secondary | ICD-10-CM

## 2010-09-28 DIAGNOSIS — Z01818 Encounter for other preprocedural examination: Secondary | ICD-10-CM

## 2010-09-28 DIAGNOSIS — R32 Unspecified urinary incontinence: Secondary | ICD-10-CM | POA: Diagnosis present

## 2010-09-28 DIAGNOSIS — D1739 Benign lipomatous neoplasm of skin and subcutaneous tissue of other sites: Secondary | ICD-10-CM | POA: Diagnosis present

## 2010-09-28 HISTORY — PX: LUMBAR SPINE SURGERY: SHX701

## 2010-09-29 LAB — CBC
Hemoglobin: 11.4 g/dL — ABNORMAL LOW (ref 13.0–17.0)
MCH: 31.8 pg (ref 26.0–34.0)
MCHC: 33.6 g/dL (ref 30.0–36.0)
MCV: 94.7 fL (ref 78.0–100.0)
Platelets: 118 10*3/uL — ABNORMAL LOW (ref 150–400)
RBC: 3.58 MIL/uL — ABNORMAL LOW (ref 4.22–5.81)

## 2010-09-30 LAB — CBC
HCT: 28.5 % — ABNORMAL LOW (ref 39.0–52.0)
MCHC: 34 g/dL (ref 30.0–36.0)
MCV: 95.6 fL (ref 78.0–100.0)
Platelets: 100 10*3/uL — ABNORMAL LOW (ref 150–400)
RDW: 13.4 % (ref 11.5–15.5)

## 2010-10-11 ENCOUNTER — Other Ambulatory Visit: Payer: Self-pay | Admitting: Internal Medicine

## 2010-10-13 NOTE — Discharge Summary (Signed)
  NAMECAESON, Ronald Stafford NO.:  [id number]  MEDICAL RECORD NO.:  [id number]  LOCATION:  XRAY                         FACILITY:  [facility]  PHYSICIAN:  Alvy Beal, MD    DATE OF BIRTH:  20-Apr-1930  DATE OF ADMISSION:  09/28/2010 DATE OF DISCHARGE:  09/30/2010                              DISCHARGE SUMMARY   ADMITTING DIAGNOSIS:  Lumbar spinal stenosis with neurogenic claudication instability.  DISCHARGE DIAGNOSIS:  Lumbar spinal stenosis with neurogenic claudication instability.  Operative procedure on day of admission was a lumbar decompression L3-5 with in situ arthrodesis L3-4.  Also excision of lipoma from the low back.  HISTORY:  This is a very pleasant, very active young 77 year old gentleman.  He has been having progressive debilitating low back pain. After attempts at conservative management failed to alleviate his pain, he presented to me and he decided to proceed with surgery.  All appropriate risks, benefits, and alternatives were discussed with the patient, consent was obtained.  Please refer to the dictated H and P.  This was in the chart.  HOSPITAL COURSE:  The patient was taken to the operating room on the date of admission for the aforementioned surgery.  He tolerated this well.  There were no adverse intraoperative events.  On postoperative day #1, the patient was made to be alert and oriented x3.  He was ambulating.  He had incisional pain only.  His postoperative hematocrit was 33.9.  He had no signs of tachycardia.  He was voiding and ambulatory.  On postoperative day 2, he was tolerating a regular diet. He had positive flatus.  Abdomen was soft and nontender, tolerating the diet, ambulating around the nurse's station and he had cleared PT.  His hematocrit on postop day #2 was 28.5 but again he was asymptomatic.  At the time of discharge, the patient will be discharged with Percocet for pain, Robaxin for muscle relaxation, Zofran in  case of nausea and baby aspirin to restart for DVT and cardiac health.  I also gave him a prescription for his Depend's undergarment as he does have incontinence secondary to a TURP procedure for prostate cancer.  The patient was doing quite well.  He will follow up with me in 2 weeks.  Preprinted discharge instructions were also provided for the patient.     Alvy Beal, MD     DDB/MEDQ  D:  09/30/2010  T:  09/30/2010  Job:  [id number]  Electronically Signed by Venita Lick MD on 10/13/2010 11:27:28 AM

## 2010-10-13 NOTE — Op Note (Signed)
  NAMETEON, HUDNALL NO.:  [id number]  MEDICAL RECORD NO.:  [id number]  LOCATION:  XRAY                         FACILITY:  [facility]  PHYSICIAN:  Alvy Beal, MD    DATE OF BIRTH:  1930-10-26  DATE OF PROCEDURE:  09/28/2010 DATE OF DISCHARGE:  09/30/2010                              OPERATIVE REPORT   ADDENDUM  It should be noted that I did remove a lipoma from his back near the midline spinal incision.  The lipoma was approximately 4 cm x 3 cm.     Alvy Beal, MD     DDB/MEDQ  D:  10/03/2010  T:  10/03/2010  Job:  [id number]  Electronically Signed by Venita Lick MD on 10/13/2010 11:27:42 AM

## 2010-10-13 NOTE — Op Note (Signed)
NAMEMITCHEL, DELDUCA NO.:  [id number]  MEDICAL RECORD NO.:  [id number]  LOCATION:  XRAY                         FACILITY:  [facility]  PHYSICIAN:  Alvy Beal, MD    DATE OF BIRTH:  May 02, 1930  DATE OF PROCEDURE: DATE OF DISCHARGE:                              OPERATIVE REPORT   PREOPERATIVE DIAGNOSIS:  Lumbar spinal stenosis with neurogenic claudication and instability.  POSTOPERATIVE DIAGNOSIS:  Lumbar spinal stenosis with neurogenic claudication and instability.  OPERATIVE PROCEDURE: 1. Lumbar decompression L3-5 with in situ arthrodesis L3-4. 2. Excision of lipoma from the lumbar spine.  COMPLICATIONS:  None.  ESTIMATED BLOOD LOSS:  1200 mL.  Cell Saver 500 returned  FIRST ASSISTANT:  Norval Gable, PA  HISTORY:  Ronald Stafford is a very pleasant active 77 year old gentleman with progressive debilitating lumbar spine pain.  Despite protracted conservative management including injections and medications, he continued to have debilitating pain.  As a result we elected to proceed with surgery.  All appropriate preoperative medical clearance was obtained and consent was obtained.  The patient preoperatively had a history of prostate cancer status post TURP with resulting incontinence.  We attempted to place a Foley catheter along with a coude catheter but this was uneventful, we could not.  In orders to prevent long term, any significant further trauma, we placed a [place], Condom catheter.  OPERATIVE REPORT:  The patient was brought to the operating room, placed on the operating room table.  After successful induction of general anesthesia, endotracheal intubation, TEDs, SCDs and a Foley was attempted.  Failure of the Foley took place, so we proceeded with procedure of the case without the Foley in place.  The patient was turned prone onto the Wilson frame.  All bony prominences well-padded. The back was prepped and draped standard fashion.  The incisions  were marked out with a needle.  X-rays were taken.  Appropriate time-out was done to confirm patient, procedure, affected extremity and all other pertinent port data.  At this point of exam, I noted that the patient had a lipoma which we did discuss preoperatively that was interfering with our incision site.  I elected to remove it as when I incised for the incision.  Some of the lipoma which was under pressure extruded into the wound.  I removed the entire lipoma including a capsule that was in. This was uneventful and I did send it to pathology for final analysis. Once the lipoma was removed, I incised the deep fascia and mobilized the paraspinal muscles from the top of C3 to the top of C5.  Once I had the 2-3, 3-4 facet complexes exposed, I dissected out laterally over them to expose the L3 and L4 transverse process.  Once I had this completely exposed, I placed a [place]field 4 underneath the lamina of L4 and took a second x-ray and confirmed that I was at the appropriate level.  At this point using Kerrison rongeurs, double-action Leksell rongeurs, I removed all the spinous process of L4 and the majority of that of L3.  I then used a nerve micro curette to develop plane underneath the lamina of L4, then used the  2 and 3-mm Kerrison punch and excised, performed a complete laminectomy of L4.  I then went inferiorly and performed a partial laminotomy of the superior portion of L5.  This allowed me to gain access into the lateral gutter and decompressed the L5 nerve root in the lateral recess.  I then traced the L5 nerve root to the L5 neural foramen and performed foraminotomies at that level as well.  At this point with the inferior portion of my decompression complete, I then proceeded caudally towards the major area of the nerve compression. After performing complete L4 laminectomy essentially, I could directly visualize significant spinal stenosis in the lateral recess and centrally.  I  then developed a plane underneath the thecal sac and continued with my 3-mm Kerrison superiorly until I performed a significant L3 laminectomy.  At this point I was above and below the major area of stenosis.  I then went out to the lateral gutter resecting the significant overgrown osteophyte and ligament and buccal ligamentum flavum.  The thecal sac was expanded directly.  There was large epidural veins which I was able to coagulate.  At this point once the central decompression was complete, I then went into the L3 neural foramens, palpated the L3 pedicle, made sure I contacted the L3 pedicle and then proceeded to perform a generous foraminotomy of L3 and then continued inferiorly at L4.  At this point once I had done this bilaterally, I had an adequate decompression.  I then took a final x-ray with marking the superior and inferior aspect of my decompression.  I had spanned from the superior aspect of the L3 pedicle to the L5 neural foramen adequately decompressing the area of the L3-4 level.  At this point with the decompression complete and the foraminotomy complete, I then decorticated the transverse processes of L3 and L4 and I removed the facet capsule at the L3-4 level.  I then placed the local bone that I had harvested from the decompression into the lateral gutter.  I irrigated copiously with normal saline and then placed a thrombin-soaked Gelfoam patty over the thecal sac to maintain hemostasis.  I then placed a drain through a separate stab incision and then closed the deep fascia with interrupted #1 Vicryl sutures, superficial 2-0 Vicryl sutures and a 3-0 Monocryl for the skin.  At the end of the case, all needle and sponge counts were correct.  The patient was extubated and transferred to PACU without incident.     Alvy Beal, MD     DDB/MEDQ  D:  09/28/2010  T:  09/29/2010  Job:  [id number]  Electronically Signed by Venita Lick MD on 10/13/2010 11:27:34 AM

## 2010-11-09 ENCOUNTER — Other Ambulatory Visit: Payer: Self-pay | Admitting: Internal Medicine

## 2010-12-06 ENCOUNTER — Ambulatory Visit: Payer: Medicare Other | Attending: Orthopedic Surgery

## 2010-12-06 DIAGNOSIS — M6281 Muscle weakness (generalized): Secondary | ICD-10-CM | POA: Insufficient documentation

## 2010-12-06 DIAGNOSIS — M545 Low back pain, unspecified: Secondary | ICD-10-CM | POA: Insufficient documentation

## 2010-12-06 DIAGNOSIS — Z96649 Presence of unspecified artificial hip joint: Secondary | ICD-10-CM | POA: Insufficient documentation

## 2010-12-06 DIAGNOSIS — R262 Difficulty in walking, not elsewhere classified: Secondary | ICD-10-CM | POA: Insufficient documentation

## 2010-12-06 DIAGNOSIS — IMO0001 Reserved for inherently not codable concepts without codable children: Secondary | ICD-10-CM | POA: Insufficient documentation

## 2010-12-12 ENCOUNTER — Ambulatory Visit: Payer: Medicare Other | Attending: Orthopedic Surgery | Admitting: Physical Therapy

## 2010-12-12 DIAGNOSIS — Z96649 Presence of unspecified artificial hip joint: Secondary | ICD-10-CM | POA: Insufficient documentation

## 2010-12-12 DIAGNOSIS — M545 Low back pain, unspecified: Secondary | ICD-10-CM | POA: Insufficient documentation

## 2010-12-12 DIAGNOSIS — M6281 Muscle weakness (generalized): Secondary | ICD-10-CM | POA: Insufficient documentation

## 2010-12-12 DIAGNOSIS — R262 Difficulty in walking, not elsewhere classified: Secondary | ICD-10-CM | POA: Insufficient documentation

## 2010-12-12 DIAGNOSIS — IMO0001 Reserved for inherently not codable concepts without codable children: Secondary | ICD-10-CM | POA: Insufficient documentation

## 2010-12-20 ENCOUNTER — Ambulatory Visit (INDEPENDENT_AMBULATORY_CARE_PROVIDER_SITE_OTHER): Payer: Medicare Other

## 2010-12-20 ENCOUNTER — Ambulatory Visit: Payer: Medicare Other | Admitting: Physical Therapy

## 2010-12-20 DIAGNOSIS — Z23 Encounter for immunization: Secondary | ICD-10-CM

## 2010-12-22 LAB — DIFFERENTIAL
Basophils Relative: 1
Lymphocytes Relative: 27
Lymphs Abs: 1.1
Monocytes Relative: 12 — ABNORMAL HIGH
Neutro Abs: 2.4
Neutrophils Relative %: 59

## 2010-12-22 LAB — LIPID PANEL
LDL Cholesterol: 69
Total CHOL/HDL Ratio: 3.6
VLDL: 21

## 2010-12-22 LAB — D-DIMER, QUANTITATIVE: D-Dimer, Quant: 0.25

## 2010-12-22 LAB — APTT: aPTT: 33

## 2010-12-22 LAB — CARDIAC PANEL(CRET KIN+CKTOT+MB+TROPI): CK, MB: 3.8

## 2010-12-22 LAB — BASIC METABOLIC PANEL
CO2: 28
Chloride: 107
GFR calc Af Amer: >60
Potassium: 4.1
Sodium: 141

## 2010-12-22 LAB — CK TOTAL AND CKMB (NOT AT ARMC): CK, MB: 3.5

## 2010-12-22 LAB — POCT CARDIAC MARKERS
CKMB, poc: 1.2
Myoglobin, poc: 104
Troponin i, poc: <0.05

## 2010-12-22 LAB — CBC
MCHC: 34.7
RBC: 4.35
WBC: 4.1

## 2010-12-22 LAB — PROTIME-INR: INR: 1.1

## 2011-01-10 ENCOUNTER — Encounter: Payer: Medicare Other | Admitting: Physical Therapy

## 2011-01-12 ENCOUNTER — Telehealth: Payer: Self-pay

## 2011-01-12 NOTE — Telephone Encounter (Signed)
We do not give steroid interarticular injections .This  would be done @ an orthopedic or sports medicine specialist's office. Call if he needs a referral.

## 2011-01-12 NOTE — Telephone Encounter (Signed)
Pt left message stating that he is having some pain in his rt shoulder and would like to know if he can get a cortisone injection. Please advise

## 2011-01-12 NOTE — Telephone Encounter (Signed)
Discuss with patient  

## 2011-02-23 ENCOUNTER — Emergency Department (HOSPITAL_COMMUNITY): Payer: Medicare Other

## 2011-02-23 ENCOUNTER — Encounter (HOSPITAL_COMMUNITY): Payer: Self-pay | Admitting: *Deleted

## 2011-02-23 ENCOUNTER — Emergency Department (HOSPITAL_COMMUNITY)
Admission: EM | Admit: 2011-02-23 | Discharge: 2011-02-23 | Disposition: A | Discharge status: Home / Self Care | Payer: Medicare Other | Attending: Emergency Medicine | Admitting: Emergency Medicine

## 2011-02-23 DIAGNOSIS — W19XXXA Unspecified fall, initial encounter: Secondary | ICD-10-CM | POA: Insufficient documentation

## 2011-02-23 DIAGNOSIS — M25559 Pain in unspecified hip: Secondary | ICD-10-CM

## 2011-02-23 DIAGNOSIS — Z8546 Personal history of malignant neoplasm of prostate: Secondary | ICD-10-CM | POA: Insufficient documentation

## 2011-02-23 DIAGNOSIS — T148XXA Other injury of unspecified body region, initial encounter: Secondary | ICD-10-CM

## 2011-02-23 DIAGNOSIS — R609 Edema, unspecified: Secondary | ICD-10-CM | POA: Insufficient documentation

## 2011-02-23 DIAGNOSIS — M25519 Pain in unspecified shoulder: Secondary | ICD-10-CM | POA: Insufficient documentation

## 2011-02-23 DIAGNOSIS — Z96649 Presence of unspecified artificial hip joint: Secondary | ICD-10-CM | POA: Insufficient documentation

## 2011-02-23 DIAGNOSIS — I10 Essential (primary) hypertension: Secondary | ICD-10-CM | POA: Insufficient documentation

## 2011-02-23 DIAGNOSIS — M25529 Pain in unspecified elbow: Secondary | ICD-10-CM | POA: Insufficient documentation

## 2011-02-23 DIAGNOSIS — S42213A Unspecified displaced fracture of surgical neck of unspecified humerus, initial encounter for closed fracture: Secondary | ICD-10-CM | POA: Insufficient documentation

## 2011-02-23 DIAGNOSIS — S4292XA Fracture of left shoulder girdle, part unspecified, initial encounter for closed fracture: Secondary | ICD-10-CM

## 2011-02-23 HISTORY — DX: Fracture of left shoulder girdle, part unspecified, initial encounter for closed fracture: S42.92XA

## 2011-02-23 MED ORDER — OXYCODONE-ACETAMINOPHEN 5-325 MG PO TABS
2.0000 | ORAL_TABLET | Freq: Once | ORAL | Status: AC
  Administered 2011-02-23: 2 via ORAL
  Filled 2011-02-23: qty 2

## 2011-02-23 MED ORDER — OXYCODONE-ACETAMINOPHEN 5-325 MG PO TABS: 2.0000 | ORAL_TABLET | ORAL | Status: AC | PRN

## 2011-02-23 NOTE — ED Provider Notes (Signed)
History     CSN: [id number] Arrival date & time: 02/23/2011  7:32 PM   First MD Initiated Contact with Patient 02/23/11 2046      Chief Complaint  Patient presents with  . Fall    (Consider location/radiation/quality/duration/timing/severity/associated sxs/prior treatment) Patient is a 77 y.o. male presenting with fall. The history is provided by the patient. No language interpreter was used.  Fall The accident occurred 3 to 5 hours ago. The fall occurred while walking. He landed on a hard floor. There was no blood loss. The point of impact was the left shoulder, left elbow and left hip. The pain is present in the left hip. The pain is at a severity of 6/10. Pertinent negatives include no visual change, no fever, no numbness, no abdominal pain, no bowel incontinence, no nausea, no vomiting, no headaches, no loss of consciousness and no tingling. The symptoms are aggravated by pressure on the injury and standing. He has tried nothing for the symptoms.   patient is here today after falling on a hard floor into shock.  He sustained a nondisplaced fracture of the left humeral head. Left hip and left elbow with no fracture. Left elbow with a hematoma. States that he also fell on Monday same Clelia Croft. Had back pain and surgery 6 months. Denies syncopal episode  Past Medical History  Diagnosis Date  . Cancer 2004    prostate cancer, Dr. Isabel Caprice  . Depression   . GERD (gastroesophageal reflux disease)   . Hyperlipidemia   . Hypertension   . Diverticulum   . Deep venous thrombosis 1997    Postop  . Colon polyps     Past Surgical History  Procedure Date  . Prostatectomy 2004    w/ radiation  . Joint replacement 1997, 2001    Total Hip replacement X 2  . Colonoscopy 2006    Neg ,Dr.Edwards  . Meckel diverticulum excision     Age 34  . Vasectomy     Family History  Problem Relation Age of Onset  . Prostate cancer Father   . Testicular cancer Son     History  Substance Use Topics    . Smoking status: Former Smoker    Quit date: 03/13/1988  . Smokeless tobacco: Not on file   Comment: Quit at about age 62   . Alcohol Use: No      Review of Systems  Constitutional: Negative for fever.  Gastrointestinal: Negative for nausea, vomiting, abdominal pain and bowel incontinence.  Neurological: Negative for tingling, loss of consciousness, numbness and headaches.  All other systems reviewed and are negative.    Allergies  Review of patient's allergies indicates no known allergies.  Home Medications   Current Outpatient Rx  Name Route Sig Dispense Refill  . BENAZEPRIL HCL 40 MG PO TABS Oral Take 1 tablet (40 mg total) by mouth daily. 30 tablet 9  . CALCIUM CARBONATE-VITAMIN D 500-200 MG-UNIT PO TABS Oral Take 1 tablet by mouth daily.      [name] FLUOXETINE HCL 10 MG PO CAPS  TAKE 1 CAPSULE EVERY DAY 30 capsule 5  . MULTIVITAMINS PO CAPS Oral Take 1 capsule by mouth 2 (two) times daily.     [name] FISH OIL 1000 MG PO CAPS Oral Take by mouth 2 (two) times daily.     [name] OMEPRAZOLE 20 MG PO CPDR  TAKE 1 CAPSULE BY MOUTH TWICE A DAY 60 capsule 5  . SIMVASTATIN 40 MG PO TABS  TAKE 1 TABLET AT  BEDTIME 90 tablet 3  . VITAMIN C 500 MG PO TABS Oral Take 500 mg by mouth daily.        BP 168/70  Pulse 96  Temp(Src) 98.7 F (37.1 C) (Oral)  Resp 22  SpO2 100%  Physical Exam  Nursing note and vitals reviewed. Constitutional: He is oriented to person, place, and time. He appears well-developed and well-nourished. No distress.  HENT:  Head: Normocephalic.  Eyes: Pupils are equal, round, and reactive to light.  Pulmonary/Chest: Effort normal.  Abdominal: Soft.  Musculoskeletal: He exhibits edema and tenderness.       L shoulder hip and elbow pain  Hematoma to L elbow  Neurological: He is alert and oriented to person, place, and time.  Skin: Skin is warm and dry. He is not diaphoretic.  Psychiatric: He has a normal mood and affect.    ED Course  Procedures (including critical  care time)  Labs Reviewed - No data to display Dg Elbow Complete Left  02/23/2011  *RADIOLOGY REPORT*  Clinical Data: Fall.  Posterior left elbow pain.  LEFT ELBOW - COMPLETE 3+ VIEW  Comparison: None.  Findings: Extensive soft tissue swelling is present of the dorsum of the elbow, likely representing a large hematoma.  Moderate degenerative changes are noted in the elbow joint. The joint is located.  There is no significant effusion.  IMPRESSION:  1.  Extensive soft tissue swelling over the dorsum of the elbow, likely representing a large hematoma. 2.  Moderate degenerative change. 3.  No acute osseous abnormality.  Original Report Authenticated By: Jamesetta Orleans. MATTERN, M.D.   Dg Hip Complete Left  02/23/2011  *RADIOLOGY REPORT*  Clinical Data: Left hip pain post fall  LEFT HIP - COMPLETE 2+ VIEW  Comparison: None.  Findings: Three views of the left hip submitted.  No acute fracture or subluxation.  There is a left hip prosthesis in anatomic alignment.  IMPRESSION: No acute fracture or subluxation.  Left hip prosthesis in anatomic alignment.  Original Report Authenticated By: Natasha Mead, M.D.   Dg Shoulder Left  02/23/2011  *RADIOLOGY REPORT*  Clinical Data: Pain post fall  LEFT SHOULDER - 2+ VIEW  Comparison: None.  Findings: Three views of the left shoulder submitted.  There is a nondisplaced fracture of the left humeral head.  Degenerative changes are noted left AC joint.  IMPRESSION: Nondisplaced fracture of the left humeral head.  Original Report Authenticated By: Natasha Mead, M.D.     No diagnosis found.    MDM  Larey Seat today and sustained a  nondisplaced fracture of the left humeral head.  The peak surgeries in the past and he would like to use his own orthopedic doctor. No immobilizer sling to the left shoulder.  Percocet for pain.        Jethro Bastos, NP 02/23/11 2141  Jethro Bastos, NP 02/23/11 2141

## 2011-02-23 NOTE — ED Notes (Signed)
Pt in s/p fall, c/o pain to left elbow, shoulder and hip, pt with limited movement in arm and difficulty walking, swelling noted to elbow

## 2011-02-23 NOTE — ED Notes (Signed)
Fell around 6 p.m. Today injurying left shoulder and left elbow---elbow very edematous--no obvious misalignment deformity of the upper extremity.  Rates the pain a 9 on 1-10 scale

## 2011-02-24 NOTE — ED Provider Notes (Signed)
Medical screening examination/treatment/procedure(s) were performed by non-physician practitioner and as supervising physician I was immediately available for consultation/collaboration.   Laray Anger, DO 02/24/11 1147

## 2011-03-29 ENCOUNTER — Ambulatory Visit: Payer: Medicare Other | Attending: Orthopedic Surgery

## 2011-03-29 DIAGNOSIS — R262 Difficulty in walking, not elsewhere classified: Secondary | ICD-10-CM | POA: Insufficient documentation

## 2011-03-29 DIAGNOSIS — M545 Low back pain, unspecified: Secondary | ICD-10-CM | POA: Insufficient documentation

## 2011-03-29 DIAGNOSIS — M6281 Muscle weakness (generalized): Secondary | ICD-10-CM | POA: Insufficient documentation

## 2011-03-29 DIAGNOSIS — Z96649 Presence of unspecified artificial hip joint: Secondary | ICD-10-CM | POA: Insufficient documentation

## 2011-03-29 DIAGNOSIS — IMO0001 Reserved for inherently not codable concepts without codable children: Secondary | ICD-10-CM | POA: Insufficient documentation

## 2011-03-30 ENCOUNTER — Ambulatory Visit: Payer: Medicare Other

## 2011-04-04 ENCOUNTER — Ambulatory Visit: Payer: Medicare Other

## 2011-05-08 ENCOUNTER — Other Ambulatory Visit: Payer: Self-pay | Admitting: Internal Medicine

## 2011-06-03 ENCOUNTER — Other Ambulatory Visit: Payer: Self-pay | Admitting: Internal Medicine

## 2011-06-05 NOTE — Telephone Encounter (Signed)
Refill done.  

## 2011-06-07 ENCOUNTER — Ambulatory Visit (INDEPENDENT_AMBULATORY_CARE_PROVIDER_SITE_OTHER): Payer: Medicare Other | Admitting: Internal Medicine

## 2011-06-07 ENCOUNTER — Encounter: Payer: Self-pay | Admitting: Internal Medicine

## 2011-06-07 VITALS — BP 130/80 | HR 76 | Temp 98.3°F | Wt 179.0 lb

## 2011-06-07 DIAGNOSIS — R51 Headache: Secondary | ICD-10-CM

## 2011-06-07 DIAGNOSIS — S80819A Abrasion, unspecified lower leg, initial encounter: Secondary | ICD-10-CM

## 2011-06-07 DIAGNOSIS — IMO0002 Reserved for concepts with insufficient information to code with codable children: Secondary | ICD-10-CM

## 2011-06-07 DIAGNOSIS — M542 Cervicalgia: Secondary | ICD-10-CM

## 2011-06-07 MED ORDER — TRAMADOL HCL 50 MG PO TABS: 50.0000 mg | ORAL_TABLET | Freq: Four times a day (QID) | ORAL | Status: AC | PRN

## 2011-06-07 NOTE — Progress Notes (Signed)
  Subjective:    Patient ID: Ronald Stafford, male    DOB: 1930/03/14, 78 y.o.   MRN: [id number]  HPI NECK PAIN: Onset:1 week ago Location:L posterior neck   Severity: up to 8 Pain is described as: burning Worse with:no factors  Better with: heating pad Pain radiates to: to L temple   Impaired range of motion: no  History of repetitive motion:  no History of overuse or hyperextension: no History of trauma: fall 2 mos ago with L shoulder fracture  Past history of similar problem: no Symptoms Back Pain:  no  Numbness/tingling: no Weakness:  no  Red Flags Fever:  no Headache:  As noted, resolved with heat also  Bowel/bladder dysfunction:  No  PMH of prostate cancer   Review of Systems   He denies blurred vision, double vision, or loss of vision. He's had no swallowing difficulties. He denies any chest pain or palpitations.  He injured his left shin; the area is itching . He  using antibiotic ointment on it.     Objective:   Physical Exam  Gen. appearance: Well-nourished, in no distress. Appears younger than stated age  Eyes: Extraocular motion intact, field of vision normal, vision grossly decreased OD even with lenses, no nystagmus. Ptosis OD > OS ENT: Canals; some wax on R;visualized tympanic membranes normal,  hearing grossly normal Neck: Normal range of motion but pain with flexion, no masses, normal thyroid. Tender to palpation posterior neck Cardiovascular: Rate and rhythm normal; no murmurs, gallops or extra heart sounds Muscle skeletal:  tone, &  strength normal. Minor DIP changes Neuro:no cranial nerve deficit, deep tendon  reflexes normal, gait normal. Romberg negative; finger-nose testing is normal Lymph: No cervical or axillary LA.  Skin: Warm and dry without suspicious lesions or rashes.No tenderness L temple. There is a mild abrasion of the left shin without evidence of cellulitis Psych: no anxiety or mood change. Normally interactive and cooperative.        Assessment & Plan:  #1 musculoskeletal neck pain with left temple radiation. There is no suggestion of a neurologic deficit. Clinically there is no cervical radiculopathy or temporal arteritis.  #2 abrasion left shin without evidence of infection. Pruritus is most likely from the antibiotic ointment causing a mild contact dermatitis  Plan: See orders and recommendations

## 2011-06-07 NOTE — Patient Instructions (Addendum)
Use an anti-inflammatory cream such as Aspercreme or Zostrix cream twice a day to theneck as needed. In lieu of this warm moist compresses or  hot water bottle can be used. Do not apply ice & do not cover the cream with a heating pad or compresses. Take the tramadol at bedtime as needed; do not take it within 8-12 hours of  the Fluoxetine.  Dip gauze in  sterile saline and applied to the wound twice a day. Cover the wound with Telfa , non stick dressing  without any antibiotic ointment. The saline can be purchased at the drugstore or you can make your own .Boil cup of salt in a gallon of water. Store mixture  in a clean container.Report Warning  signs as discussed (red streaks, pus, fever, increasing pain).

## 2011-08-11 ENCOUNTER — Telehealth: Payer: Self-pay | Admitting: Internal Medicine

## 2011-08-11 MED ORDER — OMEPRAZOLE 20 MG PO CPDR: DELAYED_RELEASE_CAPSULE | ORAL | Status: DC

## 2011-08-11 MED ORDER — FLUOXETINE HCL 10 MG PO CAPS: ORAL_CAPSULE | ORAL | Status: DC

## 2011-08-11 NOTE — Telephone Encounter (Signed)
Refill: Fluoxetine hcl 10mg  capsule. 90 day supply Omeprazole dr 20mg  capsule. 90 day supply

## 2011-08-11 NOTE — Telephone Encounter (Signed)
RXs sent.

## 2011-08-14 ENCOUNTER — Telehealth: Payer: Self-pay | Admitting: Internal Medicine

## 2011-08-14 MED ORDER — BENAZEPRIL HCL 40 MG PO TABS: 40.0000 mg | ORAL_TABLET | Freq: Every day | ORAL | Status: DC

## 2011-08-14 NOTE — Telephone Encounter (Signed)
Refill: Benazepril hcl 40mg  tablet. Take 1 tablet every day. Qty 30. Last fill 04-15-11

## 2011-08-14 NOTE — Telephone Encounter (Signed)
Rx sent 

## 2011-08-17 ENCOUNTER — Other Ambulatory Visit: Payer: Self-pay | Admitting: Internal Medicine

## 2011-08-17 MED ORDER — MELOXICAM 7.5 MG PO TABS: 7.5000 mg | ORAL_TABLET | Freq: Every day | ORAL | Status: DC

## 2011-08-17 NOTE — Telephone Encounter (Signed)
Dr.Hopper please advise 

## 2011-08-17 NOTE — Telephone Encounter (Signed)
Patient called this morning and stated he is going out of town and would like to get 12-15 pills of meloxicam 7.5mg  for his right hip pain. Patient stated he would see dr.hopper when he returned and that [facility]. Has not prescribed this medication since 2011  Meloxicam 7.5 Mg Tabs (Meloxicam) .[name].. 1 two times a day as needed joint pain Prescribed 12.2010  [facility] [phone] Can call patient back at [phone]

## 2011-08-17 NOTE — Telephone Encounter (Signed)
OK #30 

## 2011-08-17 NOTE — Telephone Encounter (Signed)
Patient aware rx sent to pharmacy.  

## 2011-08-29 ENCOUNTER — Encounter: Payer: Self-pay | Admitting: Internal Medicine

## 2011-08-29 ENCOUNTER — Ambulatory Visit (INDEPENDENT_AMBULATORY_CARE_PROVIDER_SITE_OTHER): Payer: Medicare Other | Admitting: Internal Medicine

## 2011-08-29 VITALS — BP 130/78 | HR 87 | Temp 98.3°F | Wt 183.6 lb

## 2011-08-29 DIAGNOSIS — M5410 Radiculopathy, site unspecified: Secondary | ICD-10-CM

## 2011-08-29 DIAGNOSIS — IMO0002 Reserved for concepts with insufficient information to code with codable children: Secondary | ICD-10-CM

## 2011-08-29 MED ORDER — GABAPENTIN 100 MG PO CAPS: ORAL_CAPSULE | ORAL | Status: DC

## 2011-08-29 NOTE — Progress Notes (Signed)
  Subjective:    Patient ID: Ronald Stafford, male    DOB: [birth year], 78 y.o.   MRN: [id number]  HPI He describes intermittent burning pain in the right thigh over the last 2 weeks w/o radiation. Heat has helped.There was no trigger or injury for this. The symptoms improved when he is supine worse with walking.  Significantly he had spinal stenosis surgery in July 2012. Unfortunately he fell in December 2012 and fractured his left shoulder. He has had intermittent back and hip pain since. His surgeon states that he "jarred" his back in the fall. Apparently the surgeon has suggested the possibility of epidural steroid injections if symptoms persist.   Review of Systems Constitutional: no fever, chills, sweats, change in weight  Musculoskeletal:some  muscle cramps ; no  joint stiffness, redness, or swelling Skin:no rash, color change Neuro: no weakness. Dr Isabel Caprice has treated urinary incontinence but the medication was too expensive. There is also numbness and tingling in the thigh Heme:no lymphadenopathy; abnormal bruising or bleeding   .    Objective:   Physical Exam he appears well-nourished and in no acute distress  Abdomen soft and nontender  Postoperative changes are present in the lumbosacral spine. No tenderness to percussion @ that site  Gait is slightly broad and deliberate. He is unable to do tiptoe walking. There is suggestion of a partial foot drop on the right with  heel walking.  He is able to lie flat and sit up without help. Straight leg raising is normal bilaterally. Strength and tone deep tendon reflexes are all normal  Light touch is normal and equal over the thighs        Assessment & Plan:  #1 L-2 radiculopathy most likely related to trauma 02/23/11.  #2 urinary incontinence the setting of prior treatment for prostate cancer  Plan: Gabapentin will be titrated; if symptoms fail to resolve he should consider the recommendations made by his back surgeon

## 2011-08-29 NOTE — Patient Instructions (Addendum)
Cheaper therapeutically equivalent prescription medication options may be available from  [facility] at [phone] or  [facility] [phone] (toll-free).  Assess response to the gabapentin one every 8 hours as needed. If it is partially beneficial, it can be increased up to a total of 3 pills every 8 hours as needed. This increase of 1 pill each dose  should take place over 72 hours at least.

## 2011-09-18 ENCOUNTER — Other Ambulatory Visit (INDEPENDENT_AMBULATORY_CARE_PROVIDER_SITE_OTHER): Payer: Medicare Other

## 2011-09-18 DIAGNOSIS — E785 Hyperlipidemia, unspecified: Secondary | ICD-10-CM

## 2011-09-19 LAB — LIPID PANEL
HDL: 45.7 mg/dL (ref >39.00)
LDL Cholesterol: 87 mg/dL (ref 0–99)
Total CHOL/HDL Ratio: 3
VLDL: 19 mg/dL (ref 0.0–40.0)

## 2011-09-19 NOTE — Progress Notes (Signed)
Labs only

## 2011-12-03 ENCOUNTER — Other Ambulatory Visit: Payer: Self-pay | Admitting: Internal Medicine

## 2011-12-13 ENCOUNTER — Ambulatory Visit (INDEPENDENT_AMBULATORY_CARE_PROVIDER_SITE_OTHER): Payer: Medicare Other

## 2011-12-13 DIAGNOSIS — Z23 Encounter for immunization: Secondary | ICD-10-CM

## 2012-01-15 ENCOUNTER — Other Ambulatory Visit: Payer: Self-pay | Admitting: Internal Medicine

## 2012-01-16 NOTE — Telephone Encounter (Signed)
Refill #90 

## 2012-01-16 NOTE — Telephone Encounter (Signed)
Refill Fluoxetine as #90 , NR

## 2012-01-16 NOTE — Telephone Encounter (Signed)
OV 08/29/11. Prozac last filled 08/11/11 #90 x 1    Plz advise   MW

## 2012-01-16 NOTE — Telephone Encounter (Signed)
OV 08/29/11. Prozac last filled 08/11/11 #90 x 1    Plz advise   MW 

## 2012-01-30 ENCOUNTER — Other Ambulatory Visit: Payer: Self-pay | Admitting: Internal Medicine

## 2012-01-30 NOTE — Telephone Encounter (Signed)
Refill done.  

## 2012-02-14 ENCOUNTER — Other Ambulatory Visit: Payer: Self-pay | Admitting: Internal Medicine

## 2012-02-14 NOTE — Telephone Encounter (Signed)
Rx sent.    MW 

## 2012-02-15 ENCOUNTER — Encounter: Payer: Self-pay | Admitting: Internal Medicine

## 2012-02-15 ENCOUNTER — Ambulatory Visit (INDEPENDENT_AMBULATORY_CARE_PROVIDER_SITE_OTHER): Payer: Medicare Other | Admitting: Internal Medicine

## 2012-02-15 VITALS — BP 140/78 | HR 90 | Temp 98.1°F | Resp 12 | Wt 183.0 lb

## 2012-02-15 DIAGNOSIS — M5382 Other specified dorsopathies, cervical region: Secondary | ICD-10-CM

## 2012-02-15 DIAGNOSIS — B029 Zoster without complications: Secondary | ICD-10-CM

## 2012-02-15 DIAGNOSIS — M629 Disorder of muscle, unspecified: Secondary | ICD-10-CM

## 2012-02-15 MED ORDER — TRAMADOL HCL 50 MG PO TABS: 50.0000 mg | ORAL_TABLET | Freq: Three times a day (TID) | ORAL | Status: DC | PRN

## 2012-02-15 MED ORDER — FAMCICLOVIR 500 MG PO TABS: 500.0000 mg | ORAL_TABLET | Freq: Three times a day (TID) | ORAL | Status: DC

## 2012-02-15 MED ORDER — CYCLOBENZAPRINE HCL 5 MG PO TABS: ORAL_TABLET | ORAL | Status: DC

## 2012-02-15 NOTE — Progress Notes (Signed)
  Subjective:    Patient ID: Ronald Stafford, male    DOB: 1930-07-09, 78 y.o.   MRN: [id number]  HPI  He describes left upper back pain which has been present for approximately 3 days as a nonradiating burning sensation. Intermittently will increase in intensity. It improves if he sits quietly and exacerbates with movement. He used a cream for radiation burns without benefit.  Last night he awoke with stinging over the lower left back area as well .  He has had the shingles shot     Review of Systems He denies fever, chills, sweats, or weight loss. He has no incontinence of urine or stool.      Objective:   Physical Exam He appears well-nourished and in no acute distress  There is decreased range of motion of the neck especially laterally. This causes discomfort in the upper back. There is tenderness to palpation over the left medial trapezius muscle area. There are no visible skin changes in this area  3.5 x 5 cm irregular slightly erythematous skin change left lower back with superficial broken vesicles. The erythema blanches slightly  He has no lymphadenopathy about the neck or axilla.  Strength, tone in extremities  and deep tendon reflexes are normal.  He has proximal fusiform PIP changes        Assessment & Plan:  #1 muscular pain in the area of the left medial trapezius  #2 rash lower back; atypical presentation of zoster suggested  Plan: See orders and recommendations.

## 2012-02-15 NOTE — Patient Instructions (Addendum)
Use an anti-inflammatory cream such as Aspercreme or Zostrix cream twice a day to the upper back as needed. In lieu of this warm moist compresses or  hot water bottle can be used. Do not apply ice.  Use a cervical memory foam pillow to prevent hyperextension or hyperflexion of the cervical spine.

## 2012-03-05 ENCOUNTER — Encounter: Payer: Self-pay | Admitting: Internal Medicine

## 2012-03-05 ENCOUNTER — Other Ambulatory Visit: Payer: Self-pay | Admitting: Internal Medicine

## 2012-03-05 ENCOUNTER — Ambulatory Visit (INDEPENDENT_AMBULATORY_CARE_PROVIDER_SITE_OTHER): Payer: Medicare Other | Admitting: Internal Medicine

## 2012-03-05 ENCOUNTER — Telehealth: Payer: Self-pay | Admitting: Internal Medicine

## 2012-03-05 VITALS — BP 104/60 | HR 106 | Temp 97.8°F | Wt 175.4 lb

## 2012-03-05 DIAGNOSIS — A084 Viral intestinal infection, unspecified: Secondary | ICD-10-CM

## 2012-03-05 DIAGNOSIS — A088 Other specified intestinal infections: Secondary | ICD-10-CM

## 2012-03-05 LAB — BASIC METABOLIC PANEL
Calcium: 9.6 mg/dL (ref 8.4–10.5)
Glucose, Bld: 102 mg/dL — ABNORMAL HIGH (ref 70–99)
Sodium: 141 mEq/L (ref 135–145)

## 2012-03-05 LAB — CBC WITH DIFFERENTIAL/PLATELET
Basophils Relative: 0 % (ref 0–1)
Eosinophils Absolute: 0 10*3/uL (ref 0.0–0.7)
Eosinophils Relative: 1 % (ref 0–5)
HCT: 46.5 % (ref 39.0–52.0)
Lymphs Abs: 1.1 10*3/uL (ref 0.7–4.0)
MCH: 30.9 pg (ref 26.0–34.0)
MCHC: 33.5 g/dL (ref 30.0–36.0)
Monocytes Relative: 18 % — ABNORMAL HIGH (ref 3–12)
Neutrophils Relative %: 62 % (ref 43–77)
Platelets: 158 10*3/uL (ref 150–400)
WBC: 5.6 10*3/uL (ref 4.0–10.5)

## 2012-03-05 MED ORDER — DIPHENOXYLATE-ATROPINE 2.5-0.025 MG PO TABS: 1.0000 | ORAL_TABLET | Freq: Four times a day (QID) | ORAL | Status: DC | PRN

## 2012-03-05 NOTE — Telephone Encounter (Signed)
Patient Information:  Caller Name: Chaney Malling  Phone: [phone]  Patient: Ronald Stafford  Gender: Male  DOB: 04-15-1930  Age: 78 Years  PCP: Marga Melnick  Office Follow Up:  Does the office need to follow up with this patient?: Yes  Instructions For The Office: Patient has had mild vomiting greater than 48 hours which indicates needs to be seen in office today.  Please review nurses note and follow up with patient if able to see in office or with further instruction.  RN Note:  Vomiting x 2 today  Diarrhea x 4 with yellow stool.  States fell x2 due to dizziness when trying to get to the bathroom this am no injury noted.  States no dizziness at this time. Patient states that unable to keep water down and is drinking coke and ginger ale.  Wife had virus that presented with vomiting x3 days and diarrhea x4 days.  Patient started with Symptoms and has had vomiting x3 days that is now progressing to only diarrhea.  States son started with symptoms of vomiting this morning which indicates that it is a contageous virus.  Patient instructed to take 1 tablespoon of gatorade every 5 minutes x 1 hour and is set up for a callback to check status of hydration and vomiting. Also instructed that during a stomach virus that it is important not to slow down the symptoms by taking immodium or Promethazine because it prevents the body from getting rid of the virus.  Symptoms  Reason For Call & Symptoms: Vomiting with Diarrhea  Reviewed Health History In EMR: Yes  Reviewed Medications In EMR: Yes  Reviewed Allergies In EMR: Yes  Reviewed Surgeries / Procedures: Yes  Date of Onset of Symptoms: 03/03/2012  Treatments Tried: Immodium and promethazine 25 mg  Treatments Tried Worked: No  Guideline(s) Used:  Vomiting  Disposition Per Guideline:   Home Care  Reason For Disposition Reached:   Vomiting with diarrhea  Advice Given:  Reassurance:  Vomiting can be caused by many types of illnesses. It can be  caused by a stomach flu virus. It can be caused by eating or drinking something that disagreed with your stomach.  Adults with vomiting need to stay hydrated. This is the most important thing. If you don't drink and replace lost fluids, you may get dehydrated.  You can treat vomiting, even if there is mild dehydration, at home.  Here is some care advice that should help.  Clear Liquids:  Sip water or a rehydration drink (e.g., Gatorade or Powerade).  Other options: 1/2 strength flat lemon-lime soda or ginger ale.  After 4 hours without vomiting, increase the amount.  Expected Course:  Vomiting from viral gastritis usually stops in 12 to 48 hours.  If diarrhea is present, it usually lasts for several days.  People with mild dehydration can usually treat themselves at home, by drinking more liquids.  People with moderate to severe dehydration may need medical care. signs of this include very dry mouth, dizziness, weakness, and decreased urination.  Call Back If:  Signs of dehydration occur  You become worse.

## 2012-03-05 NOTE — Progress Notes (Signed)
  Subjective:    Patient ID: Ronald Stafford, male    DOB: 25-Mar-1930, 78 y.o.   MRN: [id number]  HPI DIARRHEA :  Symptoms began 03/03/12 his nausea and vomiting followed by frank diarrhea with watery stools 2-4 times a day. He has taken Imodium AD with some improvement. He's also been a clear liquid diet with ginger ale and Coke. He did have some chills.  Recent travel history:no   Sick contacts: wife & son with similar illness    Suspicious food or water: He does drink well water as does his wife. His son has a similar illness  But does not drink from the well Change in diet: no Recent antibiotics:no Immuncompromised: no but wife is   Past medical history/family history/social history were all reviewed and updated. Pertinent data: Past medical history of Meckel's diverticulum resection. Past medical history of colon polyps       Review of Systems Abdominal Pain: no  Weight loss:no  Decreased urine output: no  Lightheadedness: some; he fell twice Fever: no  Bloody stools: no       Objective:   Physical Exam General appearance : thin but adequately nourished; w/o distress.  Eyes: No conjunctival inflammation or scleral icterus is present.  Oral exam: Dental hygiene is good; lips and gums are healthy appearing.There is no oropharyngeal erythema or exudate noted.   Heart:  Slightly increased rate and regular rhythm. S1 and S2 normal without  murmur, click, rub . S 4 gallop  Lungs:Chest clear to auscultation; no wheezes, rhonchi,rales ,or rubs present.No increased work of breathing.   Abdomen: bowel sounds hyperactive, soft and non-tender without masses, organomegaly or hernias noted.  No guarding or rebound   Skin:Warm & dry.  Intact without suspicious lesions or rashes ; no jaundice or tenting  Lymphatic: No lymphadenopathy is noted about the head, neck, axilla             Assessment & Plan:  #1 gastroenteritis, viral, communicable. Nausea and vomiting resolved.  Diarrhea improving.  Plan: Because of mild tachycardia chem/lytes and CBC will be checked. See additional orders

## 2012-03-05 NOTE — Telephone Encounter (Signed)
I have tried to call but the ph# listed is busy

## 2012-03-05 NOTE — Addendum Note (Signed)
Addended by: Silvio Pate D on: 03/05/2012 02:12 PM   Modules accepted: Orders

## 2012-03-05 NOTE — Telephone Encounter (Signed)
Can he come now?

## 2012-03-05 NOTE — Telephone Encounter (Signed)
Pt on his way 

## 2012-03-05 NOTE — Patient Instructions (Addendum)
Stay on clear liquids for 48-72 hours or until bowels are normal.This would include  jello, sherbert (NOT ice cream), Lipton[employer] chicken noodle soup(NOT cream based soups),Gatorade Lite, flat Ginger ale (without High Fructose Corn Syrup),dry toast or crackers, baked potato.No milk , dairy or grease until bowels are formed. Align , a [employer] , daily if stools are loose. Immodium AD or Lomotil 2.5 mg as needed for frankly watery stool. Report increasing pain, fever or rectal bleeding

## 2012-03-07 ENCOUNTER — Ambulatory Visit: Payer: Medicare Other

## 2012-03-07 DIAGNOSIS — R7309 Other abnormal glucose: Secondary | ICD-10-CM

## 2012-03-08 LAB — HEMOGLOBIN A1C: Hgb A1c MFr Bld: 5.9 % — ABNORMAL HIGH (ref <5.7)

## 2012-03-12 ENCOUNTER — Other Ambulatory Visit (INDEPENDENT_AMBULATORY_CARE_PROVIDER_SITE_OTHER): Payer: Medicare Other

## 2012-03-12 DIAGNOSIS — E785 Hyperlipidemia, unspecified: Secondary | ICD-10-CM

## 2012-03-14 LAB — BASIC METABOLIC PANEL
BUN: 13 mg/dL (ref 6–23)
Chloride: 102 mEq/L (ref 96–112)
Creatinine, Ser: 0.8 mg/dL (ref 0.4–1.5)
Glucose, Bld: 117 mg/dL — ABNORMAL HIGH (ref 70–99)
Potassium: 3.9 mEq/L (ref 3.5–5.1)

## 2012-04-24 ENCOUNTER — Other Ambulatory Visit: Payer: Self-pay | Admitting: Internal Medicine

## 2012-05-03 ENCOUNTER — Emergency Department (HOSPITAL_COMMUNITY): Payer: Medicare Other

## 2012-05-03 ENCOUNTER — Encounter (HOSPITAL_COMMUNITY): Payer: Self-pay | Admitting: *Deleted

## 2012-05-03 ENCOUNTER — Emergency Department (HOSPITAL_COMMUNITY)
Admission: EM | Admit: 2012-05-03 | Discharge: 2012-05-03 | Disposition: A | Discharge status: Home / Self Care | Payer: Medicare Other | Attending: Emergency Medicine | Admitting: Emergency Medicine

## 2012-05-03 DIAGNOSIS — I10 Essential (primary) hypertension: Secondary | ICD-10-CM | POA: Insufficient documentation

## 2012-05-03 DIAGNOSIS — Z860100 Personal history of colon polyps, unspecified: Secondary | ICD-10-CM | POA: Insufficient documentation

## 2012-05-03 DIAGNOSIS — Y9389 Activity, other specified: Secondary | ICD-10-CM | POA: Insufficient documentation

## 2012-05-03 DIAGNOSIS — Z8546 Personal history of malignant neoplasm of prostate: Secondary | ICD-10-CM | POA: Insufficient documentation

## 2012-05-03 DIAGNOSIS — Z8781 Personal history of (healed) traumatic fracture: Secondary | ICD-10-CM | POA: Insufficient documentation

## 2012-05-03 DIAGNOSIS — E785 Hyperlipidemia, unspecified: Secondary | ICD-10-CM | POA: Insufficient documentation

## 2012-05-03 DIAGNOSIS — Z87891 Personal history of nicotine dependence: Secondary | ICD-10-CM | POA: Insufficient documentation

## 2012-05-03 DIAGNOSIS — S62639B Displaced fracture of distal phalanx of unspecified finger, initial encounter for open fracture: Secondary | ICD-10-CM | POA: Insufficient documentation

## 2012-05-03 DIAGNOSIS — W230XXA Caught, crushed, jammed, or pinched between moving objects, initial encounter: Secondary | ICD-10-CM | POA: Insufficient documentation

## 2012-05-03 DIAGNOSIS — K219 Gastro-esophageal reflux disease without esophagitis: Secondary | ICD-10-CM | POA: Insufficient documentation

## 2012-05-03 DIAGNOSIS — Z86718 Personal history of other venous thrombosis and embolism: Secondary | ICD-10-CM | POA: Insufficient documentation

## 2012-05-03 DIAGNOSIS — Z8659 Personal history of other mental and behavioral disorders: Secondary | ICD-10-CM | POA: Insufficient documentation

## 2012-05-03 DIAGNOSIS — Y929 Unspecified place or not applicable: Secondary | ICD-10-CM | POA: Insufficient documentation

## 2012-05-03 DIAGNOSIS — Z79899 Other long term (current) drug therapy: Secondary | ICD-10-CM | POA: Insufficient documentation

## 2012-05-03 MED ORDER — CEPHALEXIN 500 MG PO CAPS: 1000.0000 mg | ORAL_CAPSULE | Freq: Two times a day (BID) | ORAL | Status: DC

## 2012-05-03 MED ORDER — BACITRACIN 500 UNIT/GM EX OINT
1.0000 "application " | TOPICAL_OINTMENT | Freq: Two times a day (BID) | CUTANEOUS | Status: DC
  Administered 2012-05-03: 1 via TOPICAL
  Filled 2012-05-03: qty 0.9

## 2012-05-03 NOTE — ED Notes (Signed)
Per pt: Pt was working with metal on a folding table.  The table top was not locked and it collapsed, pinching right middle finger.

## 2012-05-03 NOTE — ED Notes (Signed)
Cleaned patient's left middle finger with sterile saline.  Applied Bacitracin followed by guaze 2x2 and wrapped with Sof-Form.  Ortho at bedside applying finger splint.  Pt tolerated very well.

## 2012-05-03 NOTE — ED Provider Notes (Signed)
History     CSN: [id number]  Arrival date & time 05/03/12  1607   First MD Initiated Contact with Patient 05/03/12 1805      Chief Complaint  Patient presents with  . Finger Injury    (Consider location/radiation/quality/duration/timing/severity/associated sxs/prior treatment) HPI Comments: Patient presents for laceration to the ventral side of his left hand distal to the natural finger crease between the distal and proximal phalanx of the 3rd finger. Patient states he was opening up the sides of a metal table when one of the sides collapsed on his finger. Patient denies numbness or tingling as well as decreased or limited ROM in his left hand and five fingers  Patient is a 79 y.o. male presenting with skin laceration. The history is provided by the patient. No language interpreter was used.  Laceration Location:  Finger Finger laceration location:  L middle finger Quality: straight   Bleeding: controlled   Injury mechanism: collapsing wing of table. Pain details:    Quality:  Aching   Severity:  Mild Foreign body present:  No foreign bodies Ineffective treatments:  None tried Tetanus status:  Up to date   Past Medical History  Diagnosis Date  . Cancer 2004    prostate cancer, Dr. Isabel Caprice  . Depression   . GERD (gastroesophageal reflux disease)   . Hyperlipidemia   . Hypertension   . Deep venous thrombosis 1997    Postop  . Colon polyps   . Shoulder fracture, left 02/23/2011    immobilized in sling , Dr Norris(no surgery)    Past Surgical History  Procedure Laterality Date  . Prostatectomy  2004    w/ radiation, Dr Isabel Caprice  . Joint replacement  1997, 2001    Total Hip replacement X 2  . Colonoscopy  2006    Negative,Dr.Edwards  . Meckel diverticulum excision      Age 36  . Vasectomy    . Lumbar spine surgery  09/28/2010    Dr Shon Baton; for spinal stenosis  . Colonoscopy with polypectomy       PMH of    Family History  Problem Relation Age of Onset  .  Prostate cancer Father   . Testicular cancer Son     History  Substance Use Topics  . Smoking status: Former Smoker    Quit date: 03/13/1988  . Smokeless tobacco: Not on file     Comment: Quit at about age 25   . Alcohol Use: No     Review of Systems  Constitutional: Negative for fever and chills.  Musculoskeletal: Negative for joint swelling.  Skin: Positive for wound. Negative for color change.  Neurological: Negative for weakness and numbness.  All other systems reviewed and are negative.    Allergies  Review of patient's allergies indicates no known allergies.  Home Medications   Current Outpatient Rx  Name  Route  Sig  Dispense  Refill  . acetaminophen (TYLENOL) 325 MG tablet   Oral   Take 650 mg by mouth every 6 (six) hours as needed for pain. Pain         . calcium-vitamin D (OSCAL 500/200 D-3) 500-200 MG-UNIT per tablet   Oral   Take 1 tablet by mouth daily.           . famciclovir (FAMVIR) 500 MG tablet   Oral   Take 500 mg by mouth 2 (two) times daily.         [name] gabapentin (NEURONTIN) 100 MG capsule  Oral   Take 100 mg by mouth as needed. Pain         . meloxicam (MOBIC) 7.5 MG tablet   Oral   Take 1 tablet (7.5 mg total) by mouth daily.   30 tablet   0   . Multiple Vitamin (MULTIVITAMIN) capsule   Oral   Take 1 capsule by mouth 2 (two) times daily.          . Omega-3 Fatty Acids (FISH OIL) 1000 MG CAPS   Oral   Take by mouth 2 (two) times daily.          . vitamin C (ASCORBIC ACID) 500 MG tablet   Oral   Take 500 mg by mouth daily.           . cephALEXin (KEFLEX) 500 MG capsule   Oral   Take 2 capsules (1,000 mg total) by mouth 2 (two) times daily.   40 capsule   0     BP 166/77  Pulse 77  Temp(Src) 98.1 F (36.7 C) (Oral)  Resp 16  SpO2 95%  Physical Exam  Constitutional: He is oriented to person, place, and time. He appears well-developed and well-nourished. No distress.  HENT:  Head: Normocephalic and  atraumatic.  Eyes: Conjunctivae are normal. No scleral icterus.  Neck: Normal range of motion.  Cardiovascular: Intact distal pulses.   Normal capillary refill in b/l upper extremities  Musculoskeletal: He exhibits no edema.       Left hand: He exhibits tenderness and laceration. He exhibits normal range of motion, no bony tenderness, normal two-point discrimination, normal capillary refill, no deformity and no swelling. Normal sensation noted. Normal strength noted.       Hands: 1cm laceration appreciated on exam without erythema, swelling, or drainage.  2 point discrimination intact. 5/5 strength against resistance of FDP, FDS, and extension.  Neurological: He is alert and oriented to person, place, and time.  Skin: Skin is warm and dry. He is not diaphoretic.  Psychiatric: He has a normal mood and affect. His behavior is normal.    ED Course  Procedures (including critical care time)  Labs Reviewed - No data to display Dg Finger Middle Left  05/03/2012  *RADIOLOGY REPORT*  Clinical Data: Finger injury, laceration  LEFT MIDDLE FINGER 2+V  Comparison: None.  Findings: Three views of the left third finger submitted. There is a nondisplaced fracture at the  base of the distal phalanx.  IMPRESSION: Nondisplaced fracture at the base of the distal phalanx left third finger.   Original Report Authenticated By: Natasha Mead, M.D.      1. Fracture of distal phalanx of finger, open      MDM  Patient presents for finger laceration on the ventral side of his left hand distal to the the DIP-PIP joint of the third finger. Laceration is approximately 1cm, superficial, without erythema, warmth, or swelling. Patient neurovascularly intact. Patient refuses primary closure. Laceration will be cleaned and dressed appropriately. In addition, patient's x-ray shows nondisplaced fracture at the base of the distal phalanx of the third finger. Will apply finger splint. States last tetanus shot was 2 years ago.  Patient will be treated with Abx and given follow up with Dr. Janee Morn of hand. Patient otherwise stable, well appearing and non-toxic; stable for discharge. Patient history and management discussed with Dr. Wayland Salinas who is in agreement.  Filed Vitals:   05/03/12 1626 05/03/12 1800  BP: 154/65 166/77  Pulse: 88 77  Temp: 98 F (  36.7 C) 98.1 F (36.7 C)  TempSrc: Oral Oral  Resp: 20 16  SpO2: 99% 95%        Antony Madura, PA-C 05/04/12 1605

## 2012-05-03 NOTE — ED Notes (Addendum)
Pt reports around 1530 pt dropped a cut off saw mounted to a table (estimated 30 pounds) fell onto his left middle finger. Reports distal knuckle was bent backwards, unable to bend knuckle. Sensation intact. Radial pulses strong. Pain 9/10 reports throbbing with numbness.   Laceration to underside of left middle finger

## 2012-05-20 ENCOUNTER — Other Ambulatory Visit: Payer: Self-pay | Admitting: Internal Medicine

## 2012-05-21 NOTE — Telephone Encounter (Signed)
OK # 90.DECREASE to once before b'fast;may have negative impact on bone integrity @ bid .If symptoms persist , GI evaluation needed

## 2012-05-21 NOTE — Telephone Encounter (Signed)
Hopp please advise, medication not on med list

## 2012-06-06 ENCOUNTER — Ambulatory Visit (INDEPENDENT_AMBULATORY_CARE_PROVIDER_SITE_OTHER): Payer: Medicare Other | Admitting: Internal Medicine

## 2012-06-06 ENCOUNTER — Encounter: Payer: Self-pay | Admitting: Internal Medicine

## 2012-06-06 VITALS — BP 124/70 | HR 84 | Wt 182.0 lb

## 2012-06-06 DIAGNOSIS — L299 Pruritus, unspecified: Secondary | ICD-10-CM

## 2012-06-06 MED ORDER — MOMETASONE FUROATE 0.1 % EX OINT: TOPICAL_OINTMENT | Freq: Two times a day (BID) | CUTANEOUS | Status: DC

## 2012-06-06 NOTE — Progress Notes (Signed)
  Subjective:    Patient ID: Ronald Stafford, male    DOB: Jan 19, 1931, 79 y.o.   MRN: [id number]  HPI   In the last 3 weeks he's developed multiple very pruritic lesions over the right lower extremity above the knee. The exact factor or trigger is unknown. He has animal exposures to outdoor cat as well as an indoor cat and 2 indoor dogs. There's been no definite poison ivy exposure or tick exposure.  He is been applying Triple Antibiotic ointment with no benefit.    Review of Systems There has been no associated fever, chills, sweats, weight loss.       Objective:   Physical Exam He appears healthy and well-nourished in no distress  He has 3 linear red excoriations arranged horizontally along the right medial thigh. There is a punctate excoriation at the right medial knee area. The lesions are nonblanching to pressure. There is mild subcutaneous thickening at the site of each excoriation.  He has no cervical, axillary, or inguinal lymphadenopathy.  Minimal dermatographia is present.  There is also a small keratotic lesion of the left maxilla        Assessment & Plan:  #1 skin lesions; the clinical picture appears to be contact dermatitis most likely related to the triple antibiotic used to treat lesions caused by an unknown vector.  Plan: See orders recommendations

## 2012-06-06 NOTE — Patient Instructions (Addendum)
Dip gauze in  sterile saline and applied to the wound twice a day. Cover the wound with Telfa , non stick dressing  without any antibiotic ointment. The saline can be purchased at the drugstore or you can make your own .Boil cup of salt in a gallon of water. Store mixture  in a clean container.Report Warning  signs as discussed (red streaks, pus, fever, increasing pain). Use  Aveeno Daily  Moisturizing Lotion  twice a day  for the cheek lesion. Bathe with moisturizing liquid soap , not bar soap.

## 2012-06-11 ENCOUNTER — Other Ambulatory Visit: Payer: Self-pay | Admitting: Internal Medicine

## 2012-07-29 ENCOUNTER — Other Ambulatory Visit: Payer: Self-pay | Admitting: Internal Medicine

## 2012-07-30 NOTE — Telephone Encounter (Signed)
Lipid/Hep 272.4/995.20  

## 2012-08-05 ENCOUNTER — Emergency Department (HOSPITAL_COMMUNITY): Payer: Medicare Other

## 2012-08-05 ENCOUNTER — Encounter (HOSPITAL_COMMUNITY): Payer: Self-pay | Admitting: Emergency Medicine

## 2012-08-05 ENCOUNTER — Emergency Department (HOSPITAL_COMMUNITY)
Admission: EM | Admit: 2012-08-05 | Discharge: 2012-08-05 | Disposition: A | Discharge status: Home / Self Care | Payer: Medicare Other | Attending: Emergency Medicine | Admitting: Emergency Medicine

## 2012-08-05 DIAGNOSIS — R071 Chest pain on breathing: Secondary | ICD-10-CM | POA: Insufficient documentation

## 2012-08-05 DIAGNOSIS — Z87891 Personal history of nicotine dependence: Secondary | ICD-10-CM | POA: Insufficient documentation

## 2012-08-05 DIAGNOSIS — R6889 Other general symptoms and signs: Secondary | ICD-10-CM | POA: Insufficient documentation

## 2012-08-05 DIAGNOSIS — J029 Acute pharyngitis, unspecified: Secondary | ICD-10-CM | POA: Insufficient documentation

## 2012-08-05 DIAGNOSIS — J3489 Other specified disorders of nose and nasal sinuses: Secondary | ICD-10-CM | POA: Insufficient documentation

## 2012-08-05 DIAGNOSIS — R9389 Abnormal findings on diagnostic imaging of other specified body structures: Secondary | ICD-10-CM

## 2012-08-05 DIAGNOSIS — J329 Chronic sinusitis, unspecified: Secondary | ICD-10-CM | POA: Insufficient documentation

## 2012-08-05 DIAGNOSIS — Z79899 Other long term (current) drug therapy: Secondary | ICD-10-CM | POA: Insufficient documentation

## 2012-08-05 DIAGNOSIS — Z8546 Personal history of malignant neoplasm of prostate: Secondary | ICD-10-CM | POA: Insufficient documentation

## 2012-08-05 DIAGNOSIS — R51 Headache: Secondary | ICD-10-CM | POA: Insufficient documentation

## 2012-08-05 DIAGNOSIS — Z8781 Personal history of (healed) traumatic fracture: Secondary | ICD-10-CM | POA: Insufficient documentation

## 2012-08-05 DIAGNOSIS — Z860100 Personal history of colon polyps, unspecified: Secondary | ICD-10-CM | POA: Insufficient documentation

## 2012-08-05 DIAGNOSIS — F3289 Other specified depressive episodes: Secondary | ICD-10-CM | POA: Insufficient documentation

## 2012-08-05 DIAGNOSIS — R52 Pain, unspecified: Secondary | ICD-10-CM | POA: Insufficient documentation

## 2012-08-05 DIAGNOSIS — R6883 Chills (without fever): Secondary | ICD-10-CM | POA: Insufficient documentation

## 2012-08-05 DIAGNOSIS — I10 Essential (primary) hypertension: Secondary | ICD-10-CM | POA: Insufficient documentation

## 2012-08-05 DIAGNOSIS — F329 Major depressive disorder, single episode, unspecified: Secondary | ICD-10-CM | POA: Insufficient documentation

## 2012-08-05 DIAGNOSIS — E785 Hyperlipidemia, unspecified: Secondary | ICD-10-CM | POA: Insufficient documentation

## 2012-08-05 DIAGNOSIS — R10816 Epigastric abdominal tenderness: Secondary | ICD-10-CM | POA: Insufficient documentation

## 2012-08-05 DIAGNOSIS — Z86718 Personal history of other venous thrombosis and embolism: Secondary | ICD-10-CM | POA: Insufficient documentation

## 2012-08-05 DIAGNOSIS — K219 Gastro-esophageal reflux disease without esophagitis: Secondary | ICD-10-CM | POA: Insufficient documentation

## 2012-08-05 DIAGNOSIS — H6120 Impacted cerumen, unspecified ear: Secondary | ICD-10-CM | POA: Insufficient documentation

## 2012-08-05 DIAGNOSIS — J069 Acute upper respiratory infection, unspecified: Secondary | ICD-10-CM

## 2012-08-05 LAB — COMPREHENSIVE METABOLIC PANEL
ALT: 16 U/L (ref 0–53)
AST: 26 U/L (ref 0–37)
Albumin: 3.9 g/dL (ref 3.5–5.2)
CO2: 25 mEq/L (ref 19–32)
Calcium: 9.2 mg/dL (ref 8.4–10.5)
GFR calc non Af Amer: 69 mL/min — ABNORMAL LOW (ref >90)
Sodium: 137 mEq/L (ref 135–145)

## 2012-08-05 LAB — CBC WITH DIFFERENTIAL/PLATELET
Basophils Absolute: 0.1 10*3/uL (ref 0.0–0.1)
Eosinophils Relative: 0 % (ref 0–5)
MCH: 31.3 pg (ref 26.0–34.0)
Monocytes Absolute: 1.5 10*3/uL — ABNORMAL HIGH (ref 0.1–1.0)
Monocytes Relative: 27 % — ABNORMAL HIGH (ref 3–12)
Neutrophils Relative %: 61 % (ref 43–77)
Platelets: 110 10*3/uL — ABNORMAL LOW (ref 150–400)
RBC: 4.82 MIL/uL (ref 4.22–5.81)
RDW: 13.2 % (ref 11.5–15.5)
WBC: 5.7 10*3/uL (ref 4.0–10.5)

## 2012-08-05 MED ORDER — AMOXICILLIN-POT CLAVULANATE 875-125 MG PO TABS: 1.0000 | ORAL_TABLET | Freq: Two times a day (BID) | ORAL | Status: DC

## 2012-08-05 MED ORDER — ACETAMINOPHEN 500 MG PO TABS
1000.0000 mg | ORAL_TABLET | Freq: Once | ORAL | Status: AC
  Administered 2012-08-05: 1000 mg via ORAL
  Filled 2012-08-05: qty 2

## 2012-08-05 NOTE — ED Notes (Signed)
Pt escorted to discharge window. Verbalized understanding discharge instructions. In no acute distress.   

## 2012-08-05 NOTE — ED Notes (Signed)
Pt sts congestion and weakness x 1 week.  Sts chest pain with coughing.

## 2012-08-05 NOTE — ED Notes (Signed)
Pt states that he has been having headache, cough, sore throat, nasal congestion, and gen body aches x 1 wk.

## 2012-08-05 NOTE — ED Provider Notes (Signed)
History     CSN: [id number]  Arrival date & time 08/05/12  1443   First MD Initiated Contact with Patient 08/05/12 1529      Chief Complaint  Patient presents with  . Generalized Body Aches  . Nasal Congestion  . Cough  . Sore Throat    (Consider location/radiation/quality/duration/timing/severity/associated sxs/prior treatment) HPI Comments: Is a 79 year old male who presents today with nasal congestion, rhinorrhea, cough, sore throat for the past week. He states Tylenol helps, but he needs to take more Tylenol when it wears off. He is taking no decongestants. He complains of a frontal headache, worse with palpation.  He also complains of pain over his sternum which is present with palpation that he feels is exacerbated by his cough. He denies fever, but admits to chills. He denies recent illness, abdominal pain, nausea, vomiting, shortness of breath.   The history is provided by the patient. No language interpreter was used.    Past Medical History  Diagnosis Date  . Cancer 2004    prostate cancer, Dr. Isabel Caprice  . Depression   . GERD (gastroesophageal reflux disease)   . Hyperlipidemia   . Hypertension   . Deep venous thrombosis 1997    Postop  . Colon polyps   . Shoulder fracture, left 02/23/2011    immobilized in sling , Dr Norris(no surgery)    Past Surgical History  Procedure Laterality Date  . Prostatectomy  2004    w/ radiation, Dr Isabel Caprice  . Joint replacement  1997, 2001    Total Hip replacement X 2  . Colonoscopy  2006    Negative,Dr.Edwards  . Meckel diverticulum excision      Age 48  . Vasectomy    . Lumbar spine surgery  09/28/2010    Dr Shon Baton; for spinal stenosis  . Colonoscopy with polypectomy       PMH of    Family History  Problem Relation Age of Onset  . Prostate cancer Father   . Testicular cancer Son     History  Substance Use Topics  . Smoking status: Former Smoker    Quit date: 03/13/1988  . Smokeless tobacco: Not on file   Comment: Quit at about age 48   . Alcohol Use: No      Review of Systems  Constitutional: Positive for chills.  HENT: Positive for congestion, rhinorrhea, sneezing and sinus pressure.   Respiratory: Positive for cough. Negative for shortness of breath.   Gastrointestinal: Negative for nausea, vomiting and abdominal pain.  All other systems reviewed and are negative.    Allergies  Chicken allergy  Home Medications   Current Outpatient Rx  Name  Route  Sig  Dispense  Refill  . acetaminophen (TYLENOL) 325 MG tablet   Oral   Take 650 mg by mouth every 6 (six) hours as needed for pain. Pain         . calcium-vitamin D (OSCAL 500/200 D-3) 500-200 MG-UNIT per tablet   Oral   Take 1 tablet by mouth daily.           . famciclovir (FAMVIR) 500 MG tablet   Oral   Take 500 mg by mouth 2 (two) times daily.         [name] FLUoxetine (PROZAC) 10 MG capsule   Oral   Take 10 mg by mouth daily.         [name] gabapentin (NEURONTIN) 100 MG capsule   Oral   Take 100 mg by mouth as needed.  Pain         . meloxicam (MOBIC) 7.5 MG tablet   Oral   Take 1 tablet (7.5 mg total) by mouth daily.   30 tablet   0   . methocarbamol (ROBAXIN) 500 MG tablet   Oral   Take 500 mg by mouth as needed (muscle spasms).         . Multiple Vitamin (MULTIVITAMIN) capsule   Oral   Take 1 capsule by mouth 2 (two) times daily.          . Omega-3 Fatty Acids (FISH OIL) 1000 MG CAPS   Oral   Take by mouth 2 (two) times daily.          [name] omeprazole (PRILOSEC) 20 MG capsule   Oral   Take 20 mg by mouth 2 (two) times daily.         . simvastatin (ZOCOR) 40 MG tablet   Oral   Take 40 mg by mouth at bedtime.         . vitamin C (ASCORBIC ACID) 500 MG tablet   Oral   Take 500 mg by mouth daily.             BP 103/59  Pulse 114  Temp(Src) 99.9 F (37.7 C) (Oral)  Resp 18  SpO2 93%  Physical Exam  Nursing note and vitals reviewed. Constitutional: He is oriented to person,  place, and time. He appears well-developed and well-nourished. No distress.  HENT:  Head: Normocephalic and atraumatic.  Right Ear: External ear normal. No drainage.  Left Ear: External ear normal. No drainage.  Nose: Right sinus exhibits frontal sinus tenderness. Right sinus exhibits no maxillary sinus tenderness. Left sinus exhibits frontal sinus tenderness. Left sinus exhibits no maxillary sinus tenderness.  Mouth/Throat: Uvula is midline and oropharynx is clear and moist.  Cerumen impaction bilaterally  Eyes: Conjunctivae are normal.  Neck: Normal range of motion. No tracheal deviation present.  Cardiovascular: Normal rate, regular rhythm and normal heart sounds.   Pulmonary/Chest: Effort normal and breath sounds normal. No stridor. He has no wheezes. He has no rales.  Abdominal: Soft. He exhibits no distension. There is tenderness in the epigastric area. There is no rigidity, no rebound and no guarding.  Musculoskeletal: Normal range of motion.  Neurological: He is alert and oriented to person, place, and time.  Skin: Skin is warm and dry. He is not diaphoretic.  Psychiatric: He has a normal mood and affect. His behavior is normal.    ED Course  Procedures (including critical care time)  Labs Reviewed  CBC WITH DIFFERENTIAL - Abnormal; Notable for the following:    Platelets 110 (*)    Lymphocytes Relative 11 (*)    Monocytes Relative 27 (*)    Lymphs Abs 0.6 (*)    Monocytes Absolute 1.5 (*)    All other components within normal limits  COMPREHENSIVE METABOLIC PANEL - Abnormal; Notable for the following:    Glucose, Bld 117 (*)    GFR calc non Af Amer 69 (*)    GFR calc Af Amer 79 (*)    All other components within normal limits   Dg Chest 2 View  08/05/2012   *RADIOLOGY REPORT*  Clinical Data: Cough.  History of smoking.  Chest pain.  History hypertension.  CHEST - 2 VIEW  Comparison: 09/26/2010  Findings: The heart size is normal.  There are no focal consolidations or  pleural effusions.  Within the right upper lobe, there is a  nodular opacity warranting further evaluation.  No pulmonary edema.  Degenerative changes are seen in the spine. Old rib fractures are present.  IMPRESSION:  1.  CT of the chest is recommended for further evaluation of right upper lobe nodule.  Contrast administration is recommended unless contraindicated. 2.  No evidence for acute infiltrates.   Original Report Authenticated By: Norva Pavlov, M.D.     1. Upper respiratory infection   2. Abnormal chest x-ray       MDM  Patient presents today with symptoms of sinusitis. It has been worsening over the past week. Patient was given Augmentin. Take decongestants. Follow up with PCP. Discussed that chest xray showed a nodule in the right upper lobe. Follow up to have a CT of chest. Dr. Radford Pax evaluated patient and agrees with plan. Return instructions given. Vital signs stable for discharge. Patient / Family / Caregiver informed of clinical course, understand medical decision-making process, and agree with plan.        Mora Bellman, PA-C 08/05/12 1821

## 2012-08-08 ENCOUNTER — Ambulatory Visit (INDEPENDENT_AMBULATORY_CARE_PROVIDER_SITE_OTHER): Payer: Medicare Other | Admitting: Internal Medicine

## 2012-08-08 ENCOUNTER — Encounter: Payer: Self-pay | Admitting: Internal Medicine

## 2012-08-08 VITALS — BP 126/72 | HR 105 | Temp 98.2°F | Wt 180.0 lb

## 2012-08-08 DIAGNOSIS — R911 Solitary pulmonary nodule: Secondary | ICD-10-CM | POA: Insufficient documentation

## 2012-08-08 DIAGNOSIS — J209 Acute bronchitis, unspecified: Secondary | ICD-10-CM

## 2012-08-08 DIAGNOSIS — J019 Acute sinusitis, unspecified: Secondary | ICD-10-CM

## 2012-08-08 MED ORDER — HYDROCODONE-HOMATROPINE 5-1.5 MG/5ML PO SYRP: 5.0000 mL | ORAL_SOLUTION | Freq: Four times a day (QID) | ORAL | Status: DC | PRN

## 2012-08-08 MED ORDER — FLUTICASONE PROPIONATE 50 MCG/ACT NA SUSP: 1.0000 | Freq: Two times a day (BID) | NASAL | Status: DC | PRN

## 2012-08-08 NOTE — Progress Notes (Signed)
  Subjective:    Patient ID: Ronald Stafford, male    DOB: 1930/07/19, 80 y.o.   MRN: [id number]  HPI  He was seen in emergency room 08/05/12 for sinusitis. Augmentin was prescribed. An incidental finding on the chest x-ray was right upper lobe nodule.  He has residual frontal headache and minor facial pain w/o nasal discharge. He also has some sore throat. He has had some discharge from the ears but no otic pain.  He does have a smoking history of less than pack per day ages [age].    Review of Systems He describes residual chest congestion. He has some shortness of breath without wheezing.  He is not having fever, chills, or sweats.     Objective:   Physical Exam General appearance:well nourished; no acute distress or increased work of breathing is present.  No  lymphadenopathy about the head, neck, or axilla noted.   Eyes: No conjunctival inflammation or lid edema is present. .  Ears:  External ear exam shows no significant lesions or deformities.  Otoscopic examination reveals some wax bilaterally without bulging, retraction, inflammation or discharge.  Nose:  External nasal examination shows no deformity or inflammation. Nasal mucosa are pink and moist without lesions or exudates. No septal dislocation or deviation.No obstruction to airflow.   Oral exam: Dental hygiene is good; lips and gums are healthy appearing.There is no oropharyngeal erythema or exudate noted. Upper & lower partials  Neck:  No deformities,  masses, or tenderness noted.     Heart:  Normal rate and regular rhythm. S1 and S2 normal without gallop, murmur, click, rub or other extra sounds.   Lungs:Chest clear to auscultation; no wheezes, rhonchi,rales ,or rubs present.No increased work of breathing.  Dry cough  Extremities:  No cyanosis, edema, or clubbing  noted    Skin: Warm & dry          Assessment & Plan:  #1 sinusitis #2 pulmonary nodule See Orders

## 2012-08-08 NOTE — ED Provider Notes (Signed)
Medical screening examination/treatment/procedure(s) were performed by non-physician practitioner and as supervising physician I was immediately available for consultation/collaboration.   Caleel Kiner L Loyce Klasen, MD 08/08/12 1522 

## 2012-08-08 NOTE — Patient Instructions (Addendum)

## 2012-08-13 ENCOUNTER — Telehealth: Payer: Self-pay | Admitting: Internal Medicine

## 2012-08-13 NOTE — Telephone Encounter (Signed)
Patient was seen on 08/08/2012 by Dr.Hopper, seen in ED on 08/05/2012

## 2012-08-13 NOTE — Addendum Note (Signed)
Addended by: Maurice Small on: 08/13/2012 09:05 AM   Modules accepted: Orders

## 2012-08-13 NOTE — Telephone Encounter (Signed)
[facility] Triage Call Report Triage Record Num: [id number] Operator: Andreas Ohm Patient Name: Ronald Stafford Call Date & Time: 08/05/2012 2:10:32PM Patient Phone: [phone] PCP: Marga Melnick Patient Gender: Male PCP Fax : [phone] Patient DOB: 1931/03/06 Practice Name: [facility] Reason for Call: Caller: Ronald Stafford/Patient; PCP: Marga Melnick; CB#: [phone]; Call regarding Cough/Congestion; Onset 08/01/12. Caller feels short of breath, chest pain with cough. Chilling. No thermometer at home. Coughing up green phlegm. See ED d/t 'New worsening breathing problems that have not been evaluated' per Breathing Problems Protocol. Protocol(s) Used: Breathing Problems Protocol(s) Used: Cough - Adult Recommended Outcome per Protocol: See ED Immediately Reason for Outcome: Breathing symptoms (post choking episode, shortness of breath, out of breath, nasal flaring, change in skin color, anxiety) main problem New or worsening breathing problems that have not been evaluated Care Advice: ~ Another adult should drive. Call EMS 911 if develop new onset or increasing confusion or lethargy; breathing problems continue to worsen or skin becomes bluish/gray; develops chest pain. ~ ~ IMMEDIATE ACTION Take sips of clear liquids (such as water, clear fruit juices without pulp, soda, tea or coffee without dairy or non-dairy creamer, clear broth or bouillon, oral hydration solution, or plain gelatin, fruit ices/popsicles, hard candy) as tolerated. Sucking on ice chips is another option. ~ 05/

## 2012-08-14 ENCOUNTER — Ambulatory Visit (INDEPENDENT_AMBULATORY_CARE_PROVIDER_SITE_OTHER)
Admission: RE | Admit: 2012-08-14 | Discharge: 2012-08-14 | Disposition: A | Discharge status: Home / Self Care | Payer: Medicare Other | Source: Ambulatory Visit | Attending: Internal Medicine | Admitting: Internal Medicine

## 2012-08-14 DIAGNOSIS — R911 Solitary pulmonary nodule: Secondary | ICD-10-CM

## 2012-08-14 MED ORDER — IOHEXOL 300 MG/ML  SOLN
80.0000 mL | Freq: Once | INTRAMUSCULAR | Status: AC | PRN
  Administered 2012-08-14: 80 mL via INTRAVENOUS

## 2012-09-05 ENCOUNTER — Ambulatory Visit (INDEPENDENT_AMBULATORY_CARE_PROVIDER_SITE_OTHER): Payer: Medicare Other | Admitting: Internal Medicine

## 2012-09-05 VITALS — BP 148/88 | HR 74 | Temp 97.9°F | Wt 183.0 lb

## 2012-09-05 DIAGNOSIS — M25551 Pain in right hip: Secondary | ICD-10-CM

## 2012-09-05 DIAGNOSIS — M25559 Pain in unspecified hip: Secondary | ICD-10-CM

## 2012-09-05 MED ORDER — MELOXICAM 7.5 MG PO TABS: 7.5000 mg | ORAL_TABLET | Freq: Every day | ORAL | Status: DC

## 2012-09-05 NOTE — Patient Instructions (Addendum)
Fill tub with hot water and soak in it twice a day to help relieve the soft tissue/musculoskeletal pain. 

## 2012-09-05 NOTE — Progress Notes (Signed)
  Subjective:    Patient ID: Ronald Stafford, male    DOB: 1930/07/06, 79 y.o.   MRN: [id number]  HPI   His pain began in the right hip 5 days ago without specific trigger or injury. It is described as sharp, up to a level 5-8 on a 10 scale. It does improve with rest and is worse with mobilization.  There is associated pain from the low back to the right knee.  The pain is described as constant and worse with walking. There is stiffness present @ the hip.  All therapeutic modalities tried to date have been of some benefit. Specifically these include ice alternating with heat; Tylenol; and topical anti-inflammatory agents.  PMH of spinal stenosis                                                                                      Review of Systems Negative or absent signs& symptoms are delineated below: Constitutional: no fever, chills, sweats, change in weight  Musculoskeletal:no  muscle cramps or pain; no  joint  redness or swelling Skin:no rash, color/temp change Neuro: no weakness; incontinence (stool/urine). No new numbness and tingling Heme:no lymphadenopathy; abnormal bruising or bleeding     Objective:   Physical Exam Gen.: Thin but  well-nourished in appearance. Alert, appropriate and cooperative throughout exam.  Neck: No deformities, masses, or tenderness noted. Range of motion .                            Musculoskeletal/extremities: No deformity or scoliosis noted of  the thoracic or lumbar spine. No clubbing, cyanosis, edema, or significant extremity  deformity noted. Range of motion normal ; but pain with ROM R hip.Tone & strength  normal.Joints  reveal mild  DJD DIP changes. Nail health good. Able to lie down & sit up w/o help. Negative SLR bilaterally  Neurologic: Alert and oriented x3. Deep tendon reflexes symmetrical. Unable to do  heel & toe walking .        Skin: Intact without suspicious lesions or rashes. Lymph: No cervical, axillary lymphadenopathy  present. Psych: Mood and affect are normal. Normally interactive          Assessment & Plan:  #1 hip pain , probable bursitis Plan: see orders

## 2012-09-19 ENCOUNTER — Ambulatory Visit (INDEPENDENT_AMBULATORY_CARE_PROVIDER_SITE_OTHER): Payer: Medicare Other | Admitting: Internal Medicine

## 2012-09-19 ENCOUNTER — Encounter: Payer: Self-pay | Admitting: Internal Medicine

## 2012-09-19 ENCOUNTER — Other Ambulatory Visit: Payer: Self-pay | Admitting: Internal Medicine

## 2012-09-19 VITALS — BP 140/88 | HR 87 | Temp 98.3°F | Wt 184.0 lb

## 2012-09-19 DIAGNOSIS — M79651 Pain in right thigh: Secondary | ICD-10-CM

## 2012-09-19 DIAGNOSIS — M79609 Pain in unspecified limb: Secondary | ICD-10-CM

## 2012-09-19 MED ORDER — NITROGLYCERIN 0.2 MG/HR TD PT24: 1.0000 | MEDICATED_PATCH | Freq: Every day | TRANSDERMAL | Status: DC

## 2012-09-19 NOTE — Patient Instructions (Addendum)
The please apply the nitroglycerin patch once daily to the area of maximal pain. If your symptoms do not improve with nitroglycerin patch and TENS unit; physical therapy or  orthopedic consultation indicated

## 2012-09-19 NOTE — Progress Notes (Signed)
  Subjective:    Patient ID: Ronald Stafford, male    DOB: June 05, 1930, 79 y.o.   MRN: [id number]  HPI The pain began in the R anterior thigh after ROM testing last visit It is described as burning up to level 8 /10 @ rest & 10/10 with activity.R thigh numbness and tingling  The pain does not radiate. The discomfort is present constantly It is exacerbated by ambulation & stretching RLE out when supine No associated  redness, swelling ,stiffness ,skin color change, or temperature change The pain was treated with ACE, NSAIDS gel, ice,&  heat ; response was partial & temporary                                                                                   Review of Systems  Negative or absent signs& symptoms are delineated below: Constitutional: no fever, chills, sweats, change in weight  Neuro: no weakness; incontinence (stool/urine) . Heme:no lymphadenopathy; abnormal bruising or bleeding        Objective:   Physical Exam Gen.: Thin but healthy and well-nourished in appearance. Alert, appropriate and cooperative throughout exam.                  Musculoskeletal/extremities: No deformity or scoliosis noted of  the thoracic or lumbar spine. No clubbing, cyanosis, edema, or significant extremity  deformity noted. Range of motion normal .Tone & strength  normal.Joints reveal mild  DJD DIP changes. Nail health good. Able to lie down & sit up w/o help. Negative SLR bilaterally. Pain is located to the right inferior thigh and can be listed by range of motion of the right lower extremity as well as palpation of the anterior muscle groups of the thigh. Vascular: dorsalis pedis and  posterior tibial pulses are full and equal. No bruits present. Neurologic: Alert and oriented x3. Deep tendon reflexes symmetrical and normal. Unable to do heel walking; toe walking normal.        Skin: Intact without suspicious lesions or rashes.Minor ecchymosis R inferior thigh. Lipoma upper back Lymph: No cervical,  axillary lymphadenopathy present. Psych: Mood and affect are normal. Normally interactive              Assessment & Plan:  #15 pain; clinically a muscular etiology is suggested.  Plan: Physical therapy would be recommended if no better with TENS unit & NTG patch

## 2012-09-19 NOTE — Progress Notes (Signed)
  Subjective:    Patient ID: Ronald Stafford, male    DOB: 11-27-1930, 79 y.o.   MRN: [id number]  HPI  No Show      Review of Systems      Objective:   Physical Exam     Assessment & Plan:

## 2012-09-20 NOTE — Telephone Encounter (Signed)
Ok X1 

## 2012-09-20 NOTE — Telephone Encounter (Signed)
Patient see yesterday, medication not on current medication list. RX was prescribed for patient in the past,  Hopp please advise

## 2012-09-30 ENCOUNTER — Ambulatory Visit (INDEPENDENT_AMBULATORY_CARE_PROVIDER_SITE_OTHER): Payer: Medicare Other | Admitting: Internal Medicine

## 2012-09-30 ENCOUNTER — Encounter: Payer: Self-pay | Admitting: Internal Medicine

## 2012-09-30 VITALS — BP 124/72 | HR 102 | Wt 185.6 lb

## 2012-09-30 DIAGNOSIS — R1031 Right lower quadrant pain: Secondary | ICD-10-CM

## 2012-09-30 MED ORDER — TRAMADOL HCL 50 MG PO TABS: 50.0000 mg | ORAL_TABLET | Freq: Four times a day (QID) | ORAL | Status: DC | PRN

## 2012-09-30 NOTE — Progress Notes (Signed)
  Subjective:    Patient ID: Ronald Stafford, male    DOB: 05-01-1930, 79 y.o.   MRN: [id number]  HPI Symptoms originally appeared 08/31/12 as low back pain above the R buttock with radiation to the right knee. He was seen 6/26 for this. All therapies employed provided partial benefit.  As of 09/12/12 he developed pain in the right anterior thigh with numbness and tingling. He was seen for this 7/10. Nitroglycerin topically ; TENS unit as well as hot tub baths were of benefit. Also he applied an Ace bandage to the thigh.  Now he has residual sharp, intermittent right inguinal pain worse with walking. Occasionally he'll have some low lateral lumbosacral back pain but this is not a major component  Past medical history is positive for spinal stenosis    Review of Systems  He is not having associated rash, color or temperature change, swelling in the hip, or weight loss.  He denies fever, chills, sweats, dysuria, pyuria, or hematuria.     Objective:   Physical Exam  He appears well-nourished and younger than stated age. He is in no acute distress.  With ambulation he limps on the right leg due to  R inguinal pain  Strength, tone, and deep tendon reflexes are normal  There is some discomfort to percussion over the right lateral lumbosacral area above the buttocks  He has pain in the inguinal area with range of motion of the lower extremity. The inguinal area is tender to palpation.  No inguinal lymphadenopathy is noted  He is able to lie flat and sit up without help. Straight leg raising is negative bilaterally.  Genital exam is unremarkable except for left varices       Assessment & Plan:  #1 right inguinal pain and tenderness; clinically this appears to be localized rather than radicular pain. This would be an L-1 distribution.  His prior symptoms if radicular  were L3-L4 distribution.  Because of the duration of pain; its evolution; and past medical history ( prostate cancer &  spinal stenosis) imaging will be pursued if this is nondiagnostic physical therapy would be recommended

## 2012-09-30 NOTE — Patient Instructions (Addendum)
Order for x-rays entered into  the computer; these will be performed at [address] across from [facility]. No appointment is necessary. 

## 2012-10-01 ENCOUNTER — Ambulatory Visit (INDEPENDENT_AMBULATORY_CARE_PROVIDER_SITE_OTHER)
Admission: RE | Admit: 2012-10-01 | Discharge: 2012-10-01 | Disposition: A | Discharge status: Home / Self Care | Payer: Medicare Other | Source: Ambulatory Visit | Attending: Internal Medicine | Admitting: Internal Medicine

## 2012-10-01 DIAGNOSIS — R1031 Right lower quadrant pain: Secondary | ICD-10-CM

## 2012-10-07 ENCOUNTER — Ambulatory Visit: Payer: Medicare Other | Attending: Internal Medicine | Admitting: Physical Therapy

## 2012-10-07 DIAGNOSIS — M545 Low back pain, unspecified: Secondary | ICD-10-CM | POA: Insufficient documentation

## 2012-10-07 DIAGNOSIS — M25569 Pain in unspecified knee: Secondary | ICD-10-CM | POA: Insufficient documentation

## 2012-10-07 DIAGNOSIS — M25559 Pain in unspecified hip: Secondary | ICD-10-CM | POA: Insufficient documentation

## 2012-10-07 DIAGNOSIS — IMO0001 Reserved for inherently not codable concepts without codable children: Secondary | ICD-10-CM | POA: Insufficient documentation

## 2012-10-10 ENCOUNTER — Ambulatory Visit: Payer: Medicare Other | Admitting: Physical Therapy

## 2012-10-16 ENCOUNTER — Ambulatory Visit: Payer: Medicare Other | Attending: Internal Medicine | Admitting: Physical Therapy

## 2012-10-16 ENCOUNTER — Other Ambulatory Visit: Payer: Self-pay

## 2012-10-16 DIAGNOSIS — M25559 Pain in unspecified hip: Secondary | ICD-10-CM | POA: Insufficient documentation

## 2012-10-16 DIAGNOSIS — M25569 Pain in unspecified knee: Secondary | ICD-10-CM | POA: Insufficient documentation

## 2012-10-16 DIAGNOSIS — IMO0001 Reserved for inherently not codable concepts without codable children: Secondary | ICD-10-CM | POA: Insufficient documentation

## 2012-10-16 DIAGNOSIS — M545 Low back pain, unspecified: Secondary | ICD-10-CM | POA: Insufficient documentation

## 2012-10-23 ENCOUNTER — Other Ambulatory Visit: Payer: Self-pay | Admitting: *Deleted

## 2012-10-23 ENCOUNTER — Ambulatory Visit: Payer: Medicare Other

## 2012-10-23 DIAGNOSIS — M79651 Pain in right thigh: Secondary | ICD-10-CM

## 2012-10-23 MED ORDER — OMEPRAZOLE 20 MG PO CPDR: 20.0000 mg | DELAYED_RELEASE_CAPSULE | Freq: Two times a day (BID) | ORAL | Status: DC

## 2012-10-23 MED ORDER — NITROGLYCERIN 0.2 MG/HR TD PT24: 1.0000 | MEDICATED_PATCH | Freq: Every day | TRANSDERMAL | Status: DC

## 2012-10-23 NOTE — Telephone Encounter (Signed)
Left message on voicemail patient to return call to discuss refill request for Benazepril (medication is not on list), need to clarify if still taking and who prescribed

## 2012-10-23 NOTE — Telephone Encounter (Signed)
Rx has been refilled for Nitroglycerin 0.2 patch and omeprazole 20 mg. Ag cma

## 2012-10-24 ENCOUNTER — Other Ambulatory Visit: Payer: Self-pay | Admitting: *Deleted

## 2012-10-24 ENCOUNTER — Telehealth: Payer: Self-pay | Admitting: *Deleted

## 2012-10-24 ENCOUNTER — Other Ambulatory Visit: Payer: Self-pay | Admitting: Internal Medicine

## 2012-10-24 DIAGNOSIS — M79651 Pain in right thigh: Secondary | ICD-10-CM

## 2012-10-24 MED ORDER — NITROGLYCERIN 0.2 MG/HR TD PT24: 1.0000 | MEDICATED_PATCH | Freq: Every day | TRANSDERMAL | Status: DC

## 2012-10-25 ENCOUNTER — Telehealth: Payer: Self-pay | Admitting: *Deleted

## 2012-10-25 ENCOUNTER — Other Ambulatory Visit: Payer: Self-pay | Admitting: *Deleted

## 2012-10-25 DIAGNOSIS — M79651 Pain in right thigh: Secondary | ICD-10-CM

## 2012-10-25 MED ORDER — BENAZEPRIL HCL 40 MG PO TABS: ORAL_TABLET | ORAL | Status: DC

## 2012-10-25 MED ORDER — NITROGLYCERIN 0.2 MG/HR TD PT24: 1.0000 | MEDICATED_PATCH | Freq: Every day | TRANSDERMAL | Status: DC

## 2012-10-25 NOTE — Telephone Encounter (Signed)
Last OV 09/30/2012 Last refill 09/30/2012 Contract on file; no UDS. Please advise.

## 2012-10-25 NOTE — Telephone Encounter (Signed)
OK X1, R X 2 

## 2012-10-25 NOTE — Telephone Encounter (Signed)
Contacted patient 10/25/12 and ask if he was taking the Blood pressure medicine Benazepril and he stated that he is still taking and would like it refilled.  Rx has been refilled. AG cma

## 2012-10-25 NOTE — Telephone Encounter (Signed)
Error

## 2012-10-25 NOTE — Telephone Encounter (Signed)
Refills for 90 days supply of Lotensin and Ntg patches sent to [facility]

## 2012-10-25 NOTE — Telephone Encounter (Signed)
Rx was filled. ag cma

## 2012-10-28 NOTE — Telephone Encounter (Signed)
Printed, placed for signature and fax.

## 2012-11-05 ENCOUNTER — Ambulatory Visit: Payer: Medicare Other | Admitting: Physical Therapy

## 2012-11-07 ENCOUNTER — Ambulatory Visit: Payer: Medicare Other | Admitting: Physical Therapy

## 2012-11-12 ENCOUNTER — Ambulatory Visit: Payer: Medicare Other | Attending: Internal Medicine | Admitting: Physical Therapy

## 2012-11-12 DIAGNOSIS — M545 Low back pain, unspecified: Secondary | ICD-10-CM | POA: Insufficient documentation

## 2012-11-12 DIAGNOSIS — IMO0001 Reserved for inherently not codable concepts without codable children: Secondary | ICD-10-CM | POA: Insufficient documentation

## 2012-11-12 DIAGNOSIS — M25569 Pain in unspecified knee: Secondary | ICD-10-CM | POA: Insufficient documentation

## 2012-11-12 DIAGNOSIS — M25559 Pain in unspecified hip: Secondary | ICD-10-CM | POA: Insufficient documentation

## 2012-11-14 ENCOUNTER — Ambulatory Visit: Payer: Medicare Other | Admitting: Physical Therapy

## 2012-11-19 ENCOUNTER — Encounter: Payer: Medicare Other | Admitting: Physical Therapy

## 2012-11-21 ENCOUNTER — Ambulatory Visit: Payer: Medicare Other | Admitting: Physical Therapy

## 2012-11-26 ENCOUNTER — Ambulatory Visit: Payer: Medicare Other | Admitting: Physical Therapy

## 2012-11-28 ENCOUNTER — Ambulatory Visit: Payer: Medicare Other

## 2012-12-09 ENCOUNTER — Other Ambulatory Visit: Payer: Self-pay | Admitting: Internal Medicine

## 2012-12-09 DIAGNOSIS — F329 Major depressive disorder, single episode, unspecified: Secondary | ICD-10-CM

## 2012-12-09 DIAGNOSIS — F32A Depression, unspecified: Secondary | ICD-10-CM

## 2012-12-09 NOTE — Telephone Encounter (Signed)
Refill for prozac sent to [facility] [address]

## 2012-12-16 ENCOUNTER — Other Ambulatory Visit: Payer: Self-pay | Admitting: Internal Medicine

## 2012-12-17 ENCOUNTER — Other Ambulatory Visit: Payer: Self-pay | Admitting: *Deleted

## 2012-12-17 MED ORDER — SIMVASTATIN 40 MG PO TABS: 40.0000 mg | ORAL_TABLET | Freq: Every day | ORAL | Status: DC

## 2012-12-17 NOTE — Telephone Encounter (Signed)
Simvastatin refilled for 30 days. Patient needs OV and labs

## 2013-01-01 ENCOUNTER — Ambulatory Visit (INDEPENDENT_AMBULATORY_CARE_PROVIDER_SITE_OTHER): Payer: Medicare Other

## 2013-01-01 DIAGNOSIS — Z23 Encounter for immunization: Secondary | ICD-10-CM

## 2013-02-28 ENCOUNTER — Ambulatory Visit (INDEPENDENT_AMBULATORY_CARE_PROVIDER_SITE_OTHER): Payer: Medicare Other | Admitting: Internal Medicine

## 2013-02-28 ENCOUNTER — Encounter: Payer: Self-pay | Admitting: Internal Medicine

## 2013-02-28 VITALS — BP 134/64 | HR 88 | Temp 97.9°F | Wt 185.4 lb

## 2013-02-28 DIAGNOSIS — M79661 Pain in right lower leg: Secondary | ICD-10-CM

## 2013-02-28 DIAGNOSIS — M48061 Spinal stenosis, lumbar region without neurogenic claudication: Secondary | ICD-10-CM

## 2013-02-28 DIAGNOSIS — IMO0002 Reserved for concepts with insufficient information to code with codable children: Secondary | ICD-10-CM

## 2013-02-28 DIAGNOSIS — M5416 Radiculopathy, lumbar region: Secondary | ICD-10-CM

## 2013-02-28 DIAGNOSIS — Z23 Encounter for immunization: Secondary | ICD-10-CM

## 2013-02-28 DIAGNOSIS — M79609 Pain in unspecified limb: Secondary | ICD-10-CM

## 2013-02-28 MED ORDER — MELOXICAM 7.5 MG PO TABS: 7.5000 mg | ORAL_TABLET | Freq: Every day | ORAL | Status: DC

## 2013-02-28 MED ORDER — AMITRIPTYLINE HCL 25 MG PO TABS: ORAL_TABLET | ORAL | Status: DC

## 2013-02-28 NOTE — Patient Instructions (Addendum)
Do not take Tramadol within 4 hrs of Amitriptyline . Use warm moist compresses to 3 times a day to the shin hematoma

## 2013-02-28 NOTE — Progress Notes (Signed)
Pre visit review using our clinic review tool, if applicable. No additional management support is needed unless otherwise documented below in the visit note. 

## 2013-02-28 NOTE — Progress Notes (Signed)
Subjective:    Patient ID: Ronald Stafford, male    DOB: 02/08/1931, 79 y.o.   MRN: [id number]  HPI   He actually has 2 separate issues involving the right lower extremity.   One is a chronic recurrent L2-L3 radiculopathy discomfort which starts in his back and radiates to the right anterior thigh.It is described as dull and aching. This did improve with physical therapy. It also awakens him at night. Meloxicam 7.5 mg has been of benefit but he has run out of this medicine. He will occasionally have tingling in this area. There is no rash or change in color or temperature of skin in this area.  In the last 3-4 days he's developed non radiating sharp pain in the superior right shin. 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 He does not remember any distinct injury or trigger for the shin pain. He feels there has been some darkening of the skin in this area. This is described as a constant pain at a level 4. Other than the possible darker color there is no change in temperature and no associated rash in this area.Gabapentin helps this.   PMH of back surgery by Dr Shon Baton; no ESIs to date. Review of Systems   No fever, chills, sweats, unexplained weight loss,excessive fatigue No abdominal pain, black tarry stool, rectal bleeding, diarrhea No pain with urination, blood in the urine, pus in the urine No difficulty with  walking but walks slowly No pain ,color change or swelling of joints Loss of control of urine or bowels (incontinence) Abnormal bruising or bleeding      Objective:   Physical Exam Gen.: Thin but healthy and well-nourished in appearance. Alert, appropriate and cooperative throughout exam.Appears younger than stated age  Head: Normocephalic without obvious abnormalities  Eyes: No corneal or conjunctival inflammation noted.Neck: No deformities, masses, or tenderness noted. Range of motion very good.  Lungs: Normal respiratory effort; chest  expands symmetrically. Lungs are clear to auscultation without rales, wheezes, or increased work of breathing. Heart: Normal rate and rhythm. Normal S1 and S2. No gallop, click, or rub. No murmur. Abdomen: Bowel sounds normal; abdomen soft and nontender. No masses, organomegaly or hernias noted.No AAA                                Musculoskeletal/extremities: No deformity or scoliosis noted of  the thoracic or lumbar spine.  Op scar LS area No clubbing, cyanosis, edema, or significant extremity  deformity noted. Range of motion normal .Tone & strength normal. Hand joints  reveal mild  DJD DIP changes. Fingernails bitten.Tender boss R shin 5X3 cm Able to lie down & sit up w/o help. Negative SLR bilaterally Vascular: Carotid, radial artery, dorsalis pedis and  posterior tibial pulses are full and equal. No bruits present. Neurologic: Alert and oriented x3. Deep tendon reflexes symmetrical and normal. Neg SLR Foot drop on R ; gait slow & deliberate.       Skin: Faint ecchymosis R shin Lymph: No cervical, axillary lymphadenopathy present. Psych: Mood and affect are normal. Normally interactive  Assessment & Plan:  #1shin injury with boss, probable resolving subostium hematoma #2 lumbar radiculopathy See Current Assessment & Plan in Problem List under specific Diagnosis

## 2013-05-01 ENCOUNTER — Other Ambulatory Visit: Payer: Self-pay

## 2013-05-01 MED ORDER — SIMVASTATIN 40 MG PO TABS: 40.0000 mg | ORAL_TABLET | Freq: Every day | ORAL | Status: DC

## 2013-05-05 ENCOUNTER — Other Ambulatory Visit: Payer: Self-pay | Admitting: Internal Medicine

## 2013-05-05 DIAGNOSIS — E785 Hyperlipidemia, unspecified: Secondary | ICD-10-CM

## 2013-05-08 ENCOUNTER — Telehealth: Payer: Self-pay | Admitting: Internal Medicine

## 2013-05-08 NOTE — Telephone Encounter (Signed)
Called pt to notify that per Dr. Linna Darner, OV and fasting labs are needed before anymore Simvastatin rx refills. Orders entered.

## 2013-05-12 ENCOUNTER — Ambulatory Visit (INDEPENDENT_AMBULATORY_CARE_PROVIDER_SITE_OTHER): Payer: Medicare Other | Admitting: Internal Medicine

## 2013-05-12 ENCOUNTER — Other Ambulatory Visit (INDEPENDENT_AMBULATORY_CARE_PROVIDER_SITE_OTHER): Payer: Medicare Other

## 2013-05-12 ENCOUNTER — Encounter: Payer: Self-pay | Admitting: Internal Medicine

## 2013-05-12 VITALS — BP 140/70 | HR 100 | Temp 98.1°F | Resp 13 | Wt 178.6 lb

## 2013-05-12 DIAGNOSIS — I10 Essential (primary) hypertension: Secondary | ICD-10-CM

## 2013-05-12 DIAGNOSIS — E785 Hyperlipidemia, unspecified: Secondary | ICD-10-CM

## 2013-05-12 DIAGNOSIS — R7301 Impaired fasting glucose: Secondary | ICD-10-CM | POA: Insufficient documentation

## 2013-05-12 DIAGNOSIS — D126 Benign neoplasm of colon, unspecified: Secondary | ICD-10-CM

## 2013-05-12 DIAGNOSIS — Z8546 Personal history of malignant neoplasm of prostate: Secondary | ICD-10-CM

## 2013-05-12 LAB — BASIC METABOLIC PANEL
BUN: 16 mg/dL (ref 6–23)
CO2: 31 mEq/L (ref 19–32)
Calcium: 9.6 mg/dL (ref 8.4–10.5)
Chloride: 104 mEq/L (ref 96–112)
Creatinine, Ser: 1 mg/dL (ref 0.4–1.5)
GFR: 79.05 mL/min (ref >60.00)
Glucose, Bld: 105 mg/dL — ABNORMAL HIGH (ref 70–99)
POTASSIUM: 4.6 meq/L (ref 3.5–5.1)
SODIUM: 140 meq/L (ref 135–145)

## 2013-05-12 LAB — CBC WITH DIFFERENTIAL/PLATELET
BASOS PCT: 0.7 % (ref 0.0–3.0)
Basophils Absolute: 0 10*3/uL (ref 0.0–0.1)
EOS ABS: 0 10*3/uL (ref 0.0–0.7)
Eosinophils Relative: 0.7 % (ref 0.0–5.0)
HEMATOCRIT: 47.7 % (ref 39.0–52.0)
HEMOGLOBIN: 15.7 g/dL (ref 13.0–17.0)
LYMPHS ABS: 1.3 10*3/uL (ref 0.7–4.0)
LYMPHS PCT: 23.8 % (ref 12.0–46.0)
MCHC: 33 g/dL (ref 30.0–36.0)
MCV: 94.6 fl (ref 78.0–100.0)
MONO ABS: 0.8 10*3/uL (ref 0.1–1.0)
Monocytes Relative: 13.8 % — ABNORMAL HIGH (ref 3.0–12.0)
NEUTROS ABS: 3.4 10*3/uL (ref 1.4–7.7)
Neutrophils Relative %: 61 % (ref 43.0–77.0)
Platelets: 151 10*3/uL (ref 150.0–400.0)
RBC: 5.05 Mil/uL (ref 4.22–5.81)
RDW: 13.6 % (ref 11.5–14.6)
WBC: 5.6 10*3/uL (ref 4.5–10.5)

## 2013-05-12 MED ORDER — OMEPRAZOLE 20 MG PO CPDR
20.0000 mg | DELAYED_RELEASE_CAPSULE | Freq: Every day | ORAL | Status: DC
Start: 2013-05-12 — End: 2013-09-22

## 2013-05-12 MED ORDER — FLUOXETINE HCL 10 MG PO CAPS: 10.0000 mg | ORAL_CAPSULE | Freq: Every day | ORAL | Status: DC

## 2013-05-12 NOTE — Patient Instructions (Signed)
Your next office appointment will be determined based upon review of your pending labs . Those instructions will be transmitted to you  by mail. Minimal Blood Pressure Goal= AVERAGE < 150/90;  Ideal is an AVERAGE < 135/85. This AVERAGE should be calculated from @ least 5-7 BP readings taken @ different times of day on different days of week. You should not respond to isolated BP readings , but rather the AVERAGE for that week .Please bring your  blood pressure cuff to office visits to verify that it is reliable.It  can also be checked against the blood pressure device at the pharmacy. Finger or wrist cuffs are not dependable; an arm cuff is.

## 2013-05-12 NOTE — Assessment & Plan Note (Signed)
PSA

## 2013-05-12 NOTE — Progress Notes (Signed)
Pre visit review using our clinic review tool, if applicable. No additional management support is needed unless otherwise documented below in the visit note. 

## 2013-05-12 NOTE — Progress Notes (Signed)
   Subjective:    Patient ID: Ronald Stafford, male    DOB: 1930-12-24, 80 y.o.   MRN: [id number]  HPI HYPERTENSION: Disease Monitoring: does not monitor Medication Compliance:quit taking benazapril  FASTING HYPERGLYCEMIA  :  FBS range/average:no monitor Medication compliance:no meds Diet: meals being prepared by church members Ophthamology care: had eye exam on year ago Podiatry care: no  HYPERLIPIDEMIA: Disease Monitoring: Medication Compliance: yes, taking simvastatin       Review of Systems  Chest pain, palpitations:  no     Dyspnea:no Edema:no Claudication: no Lightheadedness,Syncope:no, however he does fall backwards if he closes his eyes when standing. He was seen by Physical Therapy. Avoidance techniques recommended. 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     Objective:   Physical Exam Gen.: Healthy and well-nourished in appearance. Alert, appropriate and cooperative throughout exam. Appears younger than stated age  Head: Normocephalic without obvious abnormalities;  Pattern alopecia  Eyes: No corneal or conjunctival inflammation noted. Pupils equal round reactive to light and accommodation. Extraocular motion intact. Ears: External  ear exam reveals no significant lesions or deformities. Canals clear .TMs normal. Hearing is grossly normal bilaterally. Nose: External nasal exam reveals no deformity or inflammation. Nasal mucosa are pink and moist. No lesions or exudates noted.   Mouth: Oral mucosa and oropharynx reveal no lesions or exudates. Teeth in good repair. Neck: No deformities, masses, or tenderness noted. Range of motion & Thyroid normal. Lungs: Normal respiratory effort;  chest expands symmetrically. Lungs are clear to auscultation without rales, wheezes, or increased work of breathing. Heart: Normal rate and rhythm. Normal S1 and S2. No gallop, click, or rub. S4 w/o murmur. Abdomen: Bowel sounds normal; abdomen soft and nontender. No masses, organomegaly or hernias noted.                             Musculoskeletal/extremities: No deformity or scoliosis noted of  the thoracic or lumbar spine.   No clubbing, cyanosis, edema, or significant extremity  deformity noted. Range of motion normal .Tone & strength normal. Hand joints reveal mild  PIP & DIP changes.  Fingernail  health good. Able to lie down & sit up w/o help. Negative SLR bilaterally Vascular: Carotid, radial artery, dorsalis pedis and  posterior tibial pulses are full and equal. No bruits present. Neurologic: Alert and oriented x3. Deep tendon reflexes symmetrical and normal.  Gait normal   Skin: Intact without suspicious lesions or rashes. Lymph: No cervical, axillary lymphadenopathy present. Psych: Mood and affect are normal. Normally interactive                                                                                        Assessment & Plan:  See Current Assessment & Plan in Problem List under specific Diagnosis

## 2013-05-13 ENCOUNTER — Other Ambulatory Visit: Payer: Self-pay | Admitting: Internal Medicine

## 2013-05-13 DIAGNOSIS — R7301 Impaired fasting glucose: Secondary | ICD-10-CM

## 2013-05-13 DIAGNOSIS — E785 Hyperlipidemia, unspecified: Secondary | ICD-10-CM

## 2013-05-13 NOTE — Assessment & Plan Note (Signed)
CBC

## 2013-05-13 NOTE — Assessment & Plan Note (Signed)
Lipids, LFTs,CK 

## 2013-05-13 NOTE — Assessment & Plan Note (Signed)
BP goals discussed 

## 2013-05-13 NOTE — Assessment & Plan Note (Signed)
A1c

## 2013-05-20 ENCOUNTER — Encounter: Payer: Self-pay | Admitting: *Deleted

## 2013-05-21 ENCOUNTER — Other Ambulatory Visit (INDEPENDENT_AMBULATORY_CARE_PROVIDER_SITE_OTHER): Payer: Medicare Other

## 2013-05-21 DIAGNOSIS — E785 Hyperlipidemia, unspecified: Secondary | ICD-10-CM

## 2013-05-21 DIAGNOSIS — R7301 Impaired fasting glucose: Secondary | ICD-10-CM

## 2013-05-21 LAB — HEMOGLOBIN A1C: Hgb A1c MFr Bld: 6 % (ref 4.6–6.5)

## 2013-05-21 LAB — HEPATIC FUNCTION PANEL
ALT: 20 U/L (ref 0–53)
AST: 24 U/L (ref 0–37)
Albumin: 4.2 g/dL (ref 3.5–5.2)
Alkaline Phosphatase: 49 U/L (ref 39–117)
BILIRUBIN DIRECT: 0.2 mg/dL (ref 0.0–0.3)
TOTAL PROTEIN: 6.7 g/dL (ref 6.0–8.3)
Total Bilirubin: 1.2 mg/dL (ref 0.3–1.2)

## 2013-05-21 LAB — LIPID PANEL
CHOL/HDL RATIO: 4
Cholesterol: 153 mg/dL (ref 0–200)
HDL: 38.3 mg/dL — ABNORMAL LOW (ref >39.00)
LDL CALC: 102 mg/dL — AB (ref 0–99)
Triglycerides: 63 mg/dL (ref 0.0–149.0)
VLDL: 12.6 mg/dL (ref 0.0–40.0)

## 2013-05-21 LAB — CK: CK TOTAL: 113 U/L (ref 7–232)

## 2013-06-06 ENCOUNTER — Encounter: Payer: Self-pay | Admitting: Internal Medicine

## 2013-06-06 ENCOUNTER — Ambulatory Visit (INDEPENDENT_AMBULATORY_CARE_PROVIDER_SITE_OTHER): Payer: Medicare Other | Admitting: Internal Medicine

## 2013-06-06 VITALS — BP 130/58 | HR 87 | Temp 98.3°F | Resp 15 | Wt 176.8 lb

## 2013-06-06 DIAGNOSIS — J209 Acute bronchitis, unspecified: Secondary | ICD-10-CM

## 2013-06-06 MED ORDER — AZITHROMYCIN 250 MG PO TABS: ORAL_TABLET | ORAL | Status: DC

## 2013-06-06 MED ORDER — HYDROCODONE-HOMATROPINE 5-1.5 MG/5ML PO SYRP: 5.0000 mL | ORAL_SOLUTION | Freq: Four times a day (QID) | ORAL | Status: DC | PRN

## 2013-06-06 NOTE — Progress Notes (Signed)
Pre visit review using our clinic review tool, if applicable. No additional management support is needed unless otherwise documented below in the visit note. 

## 2013-06-06 NOTE — Progress Notes (Signed)
   Subjective:    Patient ID: Ronald Stafford, male    DOB: January 11, 1931, 80 y.o.   MRN: [id number]  HPI   Symptoms began as chest congestion with a paroxysmal, dry cough. This was in the setting of having had a viral "cold syndrome"  It has been relatively stable. He was not exposed to any ill individuals. He questions whether singing in the choir has been a factor   Reflux is not significant problem.Not on ACE-I    Review of Systems He specifically denies extrinsic symptoms of itchy, watery eyes.  He has no fever, chills, or sweats  There's been no frontal headache, facial pain, nasal purulence, sore throat, or dental pain.  The cough is not associated with shortness of breath or wheezing.      Objective:   Physical Exam General appearance:good health ;well nourished; no acute distress or increased work of breathing is present.  No  lymphadenopathy about the head, neck, or axilla noted.   Eyes: No conjunctival inflammation or lid edema is present. Ears:  External ear exam shows no significant lesions or deformities.  Otoscopic examination reveals clear canals, tympanic membranes are intact bilaterally without bulging, retraction, inflammation or discharge.  Nose:  External nasal examination shows no deformity or inflammation. Nasal mucosa are dry without lesions or exudates. No septal dislocation or deviation.No obstruction to airflow.   Oral exam:  lips and gums are healthy appearing.There is no oropharyngeal erythema or exudate noted.   Neck:  No deformities,  masses, or tenderness noted.   Heart:  Normal rate and regular rhythm. S1 and S2 normal without gallop, murmur, click, rub or other extra sounds.   Lungs:Chest clear to auscultation; no wheezes, rhonchi,rales ,or rubs present.No increased work of breathing.  Intermittent dry cough  Extremities:  No cyanosis, edema, or clubbing  noted    Skin: Warm & dry.         Assessment & Plan:  #1 acute bronchitis w/o  bronchospasm #2 NO associated URI Plan: See orders and recommendations

## 2013-06-06 NOTE — Patient Instructions (Signed)
Carry room temperature water and sip liberally after coughing. 

## 2013-07-15 ENCOUNTER — Ambulatory Visit (INDEPENDENT_AMBULATORY_CARE_PROVIDER_SITE_OTHER): Payer: Medicare Other | Admitting: Internal Medicine

## 2013-07-15 ENCOUNTER — Encounter: Payer: Self-pay | Admitting: Internal Medicine

## 2013-07-15 VITALS — BP 154/70 | HR 85 | Temp 98.1°F | Resp 14 | Wt 178.2 lb

## 2013-07-15 DIAGNOSIS — M25559 Pain in unspecified hip: Secondary | ICD-10-CM

## 2013-07-15 DIAGNOSIS — E785 Hyperlipidemia, unspecified: Secondary | ICD-10-CM

## 2013-07-15 DIAGNOSIS — M25551 Pain in right hip: Secondary | ICD-10-CM

## 2013-07-15 DIAGNOSIS — M940 Chondrocostal junction syndrome [Tietze]: Secondary | ICD-10-CM

## 2013-07-15 MED ORDER — SIMVASTATIN 40 MG PO TABS: 40.0000 mg | ORAL_TABLET | Freq: Every day | ORAL | Status: DC

## 2013-07-15 MED ORDER — TRAMADOL HCL 50 MG PO TABS: 50.0000 mg | ORAL_TABLET | Freq: Three times a day (TID) | ORAL | Status: DC | PRN

## 2013-07-15 NOTE — Patient Instructions (Addendum)
For the chest wall pain (costochondritis) apply an anti-inflammatory cream such as Zostrix or Aspercreme twice a day or warm compress. Do not apply ice. Aleve 1-2 every 8-12 hours with food and also help decrease inflammation at this site.  Fill tub with hot water and soak in it twice a day to help relieve the soft tissue/musculoskeletal pain.  Minimal Blood Pressure Goal= AVERAGE < 140/90;  Ideal is an AVERAGE < 135/85. This AVERAGE should be calculated from @ least 5-7 BP readings taken @ different times of day on different days of week. You should not respond to isolated BP readings , but rather the AVERAGE for that week .Please bring your  blood pressure cuff to office visits to verify that it is reliable.It  can also be checked against the blood pressure device at the pharmacy. Finger or wrist cuffs are not dependable; an arm cuff is

## 2013-07-15 NOTE — Progress Notes (Signed)
   Subjective:    Patient ID: Ronald Stafford, male    DOB: Sep 01, 1930, 80 y.o.   MRN: [id number]  HPI  He's had pain in the right hip for 2-3 days without specific trigger or injury. This is worse when he walks. Ice and heat does help the pain. He has had hip surgery on the left  Hip as well as lumbosacral surgery.    Over the weekend and 5/2 and 5/3 he did install skylights. He's been having pain along the right parasternal area since   Review of Systems   He denies redness, swelling, stiffness in the hip.  There is no change in color or temperature of skin in this area  He also has no numbness or tingling and right lower extremity  He has no loss control of bladder or bowels.  He denies palpitations, shortness of breath, hemoptysis, fever, chills, or sweats        Objective:   Physical Exam  General appearance is one of good health and nourishment w/o distress.Appears younger than stated age  Eyes: No conjunctival inflammation or scleral icterus is present.  Heart:  Normal rate and regular rhythm. S1 and S2 normal without gallop, murmur, click, rub or other extra sounds      Lungs:Chest clear to auscultation; no wheezes, rhonchi,rales ,or rubs present.No increased work of breathing.   There is tenderness to palpation over the right parasternal area  Abdomen: bowel sounds normal, soft and non-tender without masses, organomegaly or hernias noted.  No guarding or rebound . No tenderness over the flanks to percussion  Musculoskeletal: Able to lie flat and sit up without help. Negative straight leg raising bilaterally.  He has discomfort in the medial thigh in the  inguinal area over the tendon with rotation of the hip and also has pain in the posterior hip with lateral rotation.  He is unable to perform heel walking because his right ankle inverts slightly.Gait with slight limp on R.  Skin:Warm & dry.  Intact without suspicious lesions or rashes ; no jaundice or  tenting  Lymphatic: No lymphadenopathy is noted about the head, neck, axilla          Assessment & Plan:  #1 hip pain #2 costochondritis #3 elevated BP but in pain See orders & recommendations

## 2013-07-15 NOTE — Progress Notes (Signed)
Pre visit review using our clinic review tool, if applicable. No additional management support is needed unless otherwise documented below in the visit note. 

## 2013-09-16 ENCOUNTER — Ambulatory Visit (INDEPENDENT_AMBULATORY_CARE_PROVIDER_SITE_OTHER): Payer: Medicare Other | Admitting: Internal Medicine

## 2013-09-16 ENCOUNTER — Telehealth: Payer: Self-pay | Admitting: Internal Medicine

## 2013-09-16 ENCOUNTER — Ambulatory Visit (INDEPENDENT_AMBULATORY_CARE_PROVIDER_SITE_OTHER)
Admission: RE | Admit: 2013-09-16 | Discharge: 2013-09-16 | Disposition: A | Discharge status: Home / Self Care | Payer: Medicare Other | Source: Ambulatory Visit | Attending: Internal Medicine | Admitting: Internal Medicine

## 2013-09-16 ENCOUNTER — Other Ambulatory Visit: Payer: Self-pay | Admitting: Orthopaedic Surgery

## 2013-09-16 ENCOUNTER — Encounter: Payer: Self-pay | Admitting: Internal Medicine

## 2013-09-16 VITALS — BP 150/80 | HR 92 | Temp 98.6°F | Ht 74.0 in | Wt 179.1 lb

## 2013-09-16 DIAGNOSIS — M7710 Lateral epicondylitis, unspecified elbow: Secondary | ICD-10-CM

## 2013-09-16 DIAGNOSIS — M7711 Lateral epicondylitis, right elbow: Secondary | ICD-10-CM

## 2013-09-16 DIAGNOSIS — W19XXXA Unspecified fall, initial encounter: Secondary | ICD-10-CM

## 2013-09-16 DIAGNOSIS — M545 Low back pain, unspecified: Secondary | ICD-10-CM

## 2013-09-16 DIAGNOSIS — I10 Essential (primary) hypertension: Secondary | ICD-10-CM

## 2013-09-16 NOTE — Assessment & Plan Note (Signed)
Mod to severe, likely present to some degree prior to fall, but now severe swelling, tender (but no red or bruising) post fall 6 days ago; to cont heat, right arm forearm band, tylenol prn, but also check film, and refer Dr Tamala Julian - sports medicine

## 2013-09-16 NOTE — Progress Notes (Signed)
Pre visit review using our clinic review tool, if applicable. No additional management support is needed unless otherwise documented below in the visit note. 

## 2013-09-16 NOTE — Patient Instructions (Signed)
Please continue all other medications as before, and refills have been done if requested.  Please have the pharmacy call with any other refills you may need.  Please continue your efforts at being more active, low cholesterol diet, and weight control.  You are otherwise up to date with prevention measures today.  Please keep your appointments with your specialists as you may have planned  You can also take tylenol as needed for pain.  Please go to the XRAY Department in the Basement (go straight as you get off the elevator) for the x-ray testing  You will be contacted by phone if any changes need to be made immediately.  Otherwise, you will receive a letter about your results with an explanation, but please check with MyChart first.  Please remember to sign up for MyChart if you have not done so, as this will be important to you in the future with finding out test results, communicating by private email, and scheduling acute appointments online when needed.  You will be contacted regarding the referral for: Dr Smith/sports medicine

## 2013-09-16 NOTE — Progress Notes (Signed)
Subjective:    Patient ID: Ronald Stafford, male    DOB: 1930/04/16, 80 y.o.   MRN: [id number]  HPI  Here with acute onset right elbow pain after a fall yesterday, now with mod pain/swelling/mild erythema to area, worse to extend the arm, better to rest. No shoulder, neck or other joint pain. No bruising or skin tears. Pt denies chest pain, increased sob or doe, wheezing, orthopnea, PND, increased LE swelling, palpitations, dizziness or syncope. Has planned lumbar surgury next wk with Dr Dutch Gray., Pt continues to have recurring LBP without change in severity, bowel or bladder change, fever, wt loss,  worsening LE pain/numbness/weakness, gait change or falls. Past Medical History  Diagnosis Date  . Cancer 2004    prostate cancer, Dr. Risa Grill  . Depression   . GERD (gastroesophageal reflux disease)   . Hyperlipidemia   . Hypertension   . Deep venous thrombosis 1997    Postop  . Colon polyps   . Shoulder fracture, left 02/23/2011    immobilized in sling , Dr Norris(no surgery)   Past Surgical History  Procedure Laterality Date  . Prostatectomy  2004    w/ radiation, Dr Risa Grill  . Joint replacement  1997, 2001    Total Hip replacement X 2  . Colonoscopy  2006    Negative,Dr.Edwards  . Meckel diverticulum excision      Age 52  . Vasectomy    . Lumbar spine surgery  09/28/2010    Dr Rolena Infante; for spinal stenosis  . Colonoscopy with polypectomy       PMH of    reports that he quit smoking about 25 years ago. He does not have any smokeless tobacco history on file. He reports that he does not drink alcohol or use illicit drugs. family history includes Prostate cancer in his father; Testicular cancer in his son. Allergies  Allergen Reactions  . Chicken Allergy Nausea And Vomiting    Digestive problems   Current Outpatient Prescriptions on File Prior to Visit  Medication Sig Dispense Refill  . acetaminophen (TYLENOL) 325 MG tablet Take 650 mg by mouth every 6 (six) hours as needed for  pain. Pain      . Omega-3 Fatty Acids (FISH OIL) 1000 MG CAPS Take by mouth 2 (two) times daily.       [name] omeprazole (PRILOSEC) 20 MG capsule Take 1 capsule (20 mg total) by mouth daily.  90 capsule  3  . simvastatin (ZOCOR) 40 MG tablet Take 1 tablet (40 mg total) by mouth daily.  90 tablet  1  . traMADol (ULTRAM) 50 MG tablet Take 1 tablet (50 mg total) by mouth every 8 (eight) hours as needed.  30 tablet  0   No current facility-administered medications on file prior to visit.   Review of Systems  Constitutional: Negative for unusual diaphoresis or other sweats  HENT: Negative for ringing in ear Eyes: Negative for double vision or worsening visual disturbance.  Respiratory: Negative for choking and stridor.   Gastrointestinal: Negative for vomiting or other signifcant bowel change Genitourinary: Negative for hematuria or decreased urine volume.  Musculoskeletal: Negative for other MSK pain or swelling Skin: Negative for color change and worsening wound.  Neurological: Negative for tremors and numbness other than noted  Psychiatric/Behavioral: Negative for decreased concentration or agitation other than above       Objective:   Physical Exam BP 150/80  Pulse 92  Temp(Src) 98.6 F (37 C) (Oral)  Ht 6\' 2"  (1.88 m)  Wt 179 lb 2 oz (81.251 kg)  BMI 22.99 kg/m2  SpO2 96% VS noted,  Constitutional: Pt appears well-developed, well-nourished.  HENT: Head: NCAT.  Right Ear: External ear normal.  Left Ear: External ear normal.  Eyes: . Pupils are equal, round, and reactive to light. Conjunctivae and EOM are normal Neck: Normal range of motion. Neck supple.  Cardiovascular: Normal rate and regular rhythm.   Pulmonary/Chest: Effort normal and breath sounds normal.  Abd:  Soft, NT, ND, + BS Neurological: Pt is alert. Not confused , motor grossly intact Skin: Skin is warm. No rash Psychiatric: Pt behavior is normal. No agitation.  Right elbow with 1-2+ tender, swelling, decrsaed ROM to  full extension        Assessment & Plan:

## 2013-09-16 NOTE — Telephone Encounter (Signed)
Relevant patient education assigned to patient using Emmi. ° °

## 2013-09-18 ENCOUNTER — Other Ambulatory Visit (INDEPENDENT_AMBULATORY_CARE_PROVIDER_SITE_OTHER): Payer: Medicare Other

## 2013-09-18 ENCOUNTER — Encounter: Payer: Self-pay | Admitting: Family Medicine

## 2013-09-18 ENCOUNTER — Ambulatory Visit (INDEPENDENT_AMBULATORY_CARE_PROVIDER_SITE_OTHER): Payer: Medicare Other | Admitting: Family Medicine

## 2013-09-18 VITALS — BP 140/78 | HR 85 | Ht 74.0 in | Wt 179.0 lb

## 2013-09-18 DIAGNOSIS — S52121A Displaced fracture of head of right radius, initial encounter for closed fracture: Secondary | ICD-10-CM

## 2013-09-18 DIAGNOSIS — M25521 Pain in right elbow: Secondary | ICD-10-CM

## 2013-09-18 DIAGNOSIS — S52123A Displaced fracture of head of unspecified radius, initial encounter for closed fracture: Secondary | ICD-10-CM

## 2013-09-18 DIAGNOSIS — M25529 Pain in unspecified elbow: Secondary | ICD-10-CM

## 2013-09-18 HISTORY — DX: Displaced fracture of head of unspecified radius, initial encounter for closed fracture: S52.123A

## 2013-09-18 NOTE — Patient Instructions (Signed)
Good ot see you You have a radial head fracture Sling for 1 week.  Ice 20 minutes 3 times a day Tylenol 650mg  three times daily for next week 1-2 times a day come out of the sling and move in flexion and extension but no lifting.  Come back in 2 weeks and get xray before you come in downstairs.   Elbow Fracture, Radial Head with Rehab The radial head is the end of the forearm bone on the thumb side of the arm (radius). It is part of the elbow. The radial head is vulnerable to both complete and incomplete fractures. SYMPTOMS   Severe elbow and arm pain, at the time of injury.  Tenderness, inflammation, and later bruising (contusion) of the elbow (within 48 hours).  Visible deformity, if the fracture is complete, and the bone fragments are not aligned properly (displaced).  Numbness, coldness, or paralysis in the elbow, forearm, or hand from pressure on the blood vessels or nerves (uncommon). CAUSES  An elbow fracture occurs when a force is placed on the bone that is greater than it can handle. Typical causes of injury include:  Indirect trauma, such as falling on an outstretched hand (most common cause).  Direct hit (trauma) to the elbow.  Twisting injury to the elbow. RISK INCREASES WITH:  Contact sports (football, rugby).  Sports in which falling is likely (skating, basketball).  Children under 68 years of age and adults over 60.  Bone or joint disease (osteoarthritis, bone tumor).  Poor strength and flexibility. PREVENTION   Warm up and stretch properly before activity.  Maintain physical fitness:  Strength, flexibility, and endurance.  Cardiovascular fitness.  When appropriate, wear properly fitted and padded elbow protection. PROGNOSIS  If treated properly, elbow fractures often heal within 6 to 8 weeks for adults, and 4 to 6 weeks in children.  RELATED COMPLICATIONS   Fracture does not heal (nonunion).  Fracture heals in improper alignment  (malunion).  Chronic pain, stiffness, loss of motion, or swelling of the elbow.  Excessive bleeding in the elbow or at the fracture site, causing pressure and injury to nerves and blood vessels (uncommon).  Calcification of the soft tissues around the elbow (heterotopic ossification).  Risk of bone death, due to interrupted blood supply caused by the fracture.  Unstable or arthritic joint, following repeated injury.  Stopping of normal bone growth in children.  Wasting away (atrophy), weakness, stiffness, numbness, and poor control of the hand, due to injury to blood vessels, nerves, cartilage, muscle, ligaments, and connective tissue sheets (fascia). TREATMENT  If the fracture is displaced, it must be put back in proper alignment (reduced) by an individual trained in the procedure. Often, displaced fractures cannot be realigned by hand, and surgery is needed. Once the bones are aligned (with or without surgery), ice and medicine can be used to reduce pain and inflammation. The elbow should be restrained for at least 1 to 2 weeks. After restraint, it is important to complete strengthening and stretching exercises, to regain strength and a full range of motion. Theses exercises may be completed at home or with a therapist.  MEDICATION   If pain medicine is needed, nonsteroidal anti-inflammatory medicines (aspirin and ibuprofen), or other minor pain relievers (acetaminophen), are often advised.  Do not take pain medicine for 7 days before surgery.  Prescription pain relievers may be given, if your caregiver thinks they are needed. Use only as directed and only as much as you need. COLD THERAPY  Cold treatment (icing)  should be applied for 10 to 15 minutes every 2 to 3 hours for inflammation and pain, and immediately after activity that aggravates your symptoms. Use ice packs or an ice massage. SEEK MEDICAL CARE IF:  Pain, tenderness, or swelling gets worse, despite treatment.  You  experience pain, numbness, or coldness in the hand.  Blue, gray, or dark color appears in the fingernails.  Any of the following occur after surgery: fever, increased pain, swelling, redness, drainage of fluids, or bleeding in the affected area.  New, unexplained symptoms develop. (Drugs used in treatment may produce side effects.) EXERCISES  RANGE OF MOTION (ROM) AND STRETCHING EXERCISES - Elbow Fracture (Radial Head) These exercises may help you restore your elbow mobility once your physician has discontinued your restraint period. Beginning these before your caregiver's approval may result in delayed healing. Your symptoms may go away with or without further involvement from your physician, physical therapist or athletic trainer. While completing these exercises, remember:   Restoring tissue flexibility helps normal motion to return to the joints. This allows healthier, less painful movement and activity.  An effective stretch should be held for at least 30 seconds. A stretch should never be painful. You should only feel a gentle lengthening or release in the stretch. RANGE OF MOTION - Supination, Active-Assisted  Sit with your right / left elbow bent at 90 degrees, resting your forearm on a table.  Keeping your upper body and shoulder in place, roll your forearm so your palm faces upward. When you can go no farther, use your opposite hand to help, until you feel a gentle to moderate stretch. Hold for __________ seconds.  Slowly release the stretch and return to the starting position. Repeat __________ times. Complete this exercise __________ times per day. RANGE OF MOTION - Pronation, Active-Assisted   Sit with your right / left elbow bent at 90 degrees, resting your forearm on a table.  Keeping your upper body and shoulder in place, roll your forearm so your palm faces the tabletop. When you can go no farther, use your opposite hand to help, until you feel a gentle to moderate stretch.  Hold for __________ seconds.  Slowly release the stretch and return to the starting position. Repeat __________ times. Complete this exercise __________ times per day. RANGE OF MOTION - Extension  Hold your right / left arm at your side and straighten your elbow as far as you can, using your right / left arm muscles.  Straighten the right / left elbow farther by gently pushing down on your forearm, until you feel a gentle stretch on the inside of your elbow. Hold this position for __________ seconds.  Slowly return to the starting position. Repeat __________ times. Complete this exercise __________ times per day.  RANGE OF MOTION - Flexion  Hold your right / left arm at your side and bend your elbow as far as you can, using your right / left arm muscles.  Bend the right / left elbow farther by gently pushing up on your forearm, until you feel a gentle stretch on the outside of your elbow. Hold this position for __________ seconds.  Slowly return to the starting position. Repeat __________ times. Complete this exercise __________ times per day.  RANGE OF MOTION - Supination, Active  Stand or sit with your elbows at your side. Bend your right / left elbow to 90 degrees.  Turn your palm upward until you feel a gentle stretch on the inside of your forearm.  Hold  this position for __________ seconds. Slowly release and return to the starting position. Repeat __________ times. Complete this stretch __________ times per day.  RANGE OF MOTION - Pronation, Active  Stand or sit with your elbows at your side. Bend your right / left elbow to 90 degrees.  Turn your palm downward until you feel a gentle stretch on the top of your forearm.  Hold this position for __________ seconds. Slowly release and return to the starting position. Repeat __________ times. Complete this stretch __________ times per day.  RANGE OF MOTION - Elbow Flexion, Supine  Lie on your back. Extend your right / left arm  into the air, bracing it with your opposite hand. Allow your right / left arm to relax.  Let your elbow bend, allowing your hand to fall slowly toward your chest.  You should feel a gentle stretch along the back of your upper arm and elbow. Your physician, physical therapist or athletic trainer may ask you to hold a __________ hand weight, to increase the intensity of this stretch.  Hold for __________ seconds. Slowly return your right / left arm to the upright position. Repeat __________ times. Complete this exercise __________ times per day. STRETCH - Elbow flexors   Lie on a firm bed or countertop, on your back. Be sure that you are in a comfortable position which will allow you to relax your arm muscles.  Place a folded towel under your right / left upper arm, so that your elbow and shoulder are at the same height. Extend your arm; your elbow should not rest on the bed or towel.  Allow the weight of your hand to straighten your elbow. Keep your arm and chest muscles relaxed. Your caregiver may ask you to increase the intensity of your stretch by adding a small wrist or hand weight.  Hold for __________ seconds. You should feel a stretch on the inside of your elbow. Slowly return to the starting position. Repeat __________ times. Complete this exercise __________ times per day. STRENGTHENING EXERCISES - Elbow Fracture (Radial Head) These exercises may help you restore your elbow mobility once your physician has discontinued your restraint period. Beginning these before your caregiver's approval may result in delayed healing. Your symptoms may go away with or without further involvement from your physician, physical therapist or athletic trainer. While completing these exercises, remember:   Restoring tissue flexibility helps normal motion to return to the joints. This allows healthier, less painful movement and activity.  An effective stretch should be held for at least 30 seconds. A  stretch should never be painful. You should only feel a gentle lengthening or release in the stretch.  You may experience muscle soreness or fatigue, but the pain or discomfort you are trying to eliminate should never worsen during these exercises. If this pain does get worse, stop and make sure you are following the directions exactly. If the pain is still present after adjustments, discontinue the exercise until you can discuss the trouble with your caregiver. STRENGTH - Elbow Flexors, Isometric  Stand or sit upright, on a firm surface. Place your right / left arm so that your hand is palm-up, and at the height of your waist.  Place your opposite hand on top of your forearm. Gently push down as your right / left arm resists. Push as hard as you can with both arms, without causing any pain or movement at your right / left elbow. Hold this stationary position for __________ seconds.  Gradually  release the tension in both arms. Allow your muscles to relax completely before repeating. Repeat __________ times. Complete this exercise __________ times per day. STRENGTH - Elbow Extensors, Isometric  Stand or sit upright, on a firm surface. Place your right / left arm so that your palm faces your stomach, and it is at the height of your waist.  Place your opposite hand on the underside of your forearm. Gently push up as your right / left arm resists. Push as hard as you can with both arms, without causing any pain or movement at your right / left elbow. Hold this stationary position for __________ seconds.  Gradually release the tension in both arms. Allow your muscles to relax completely before repeating. Repeat __________ times. Complete this exercise __________ times per day. STRENGTH - Elbow Flexors, Supinated  With good posture, stand, or sit on a firm chair without armrests. Allow your right / left arm to rest at your side with your palm facing forward.  Holding a __________ weight, or gripping  a rubber exercise band or tubing, bring your hand toward your shoulder.  Allow your muscles to control the resistance, as your hand returns to your side. Repeat __________ times. Complete this exercise __________ times per day.  STRENGTH - Elbow Extensors, Dynamic  With good posture, stand, or sit on a firm chair without armrests. Keeping your upper arms at your side, bring both hands up toward your right / left shoulder, while gripping a rubber exercise band or tubing. Your right / left hand should be just below the other hand.  Straighten your right / left elbow. Hold for __________ seconds.  Allow your muscles to control the rubber exercise band, as your hand returns to your shoulder. Repeat __________ times. Complete this exercise __________ times per day. STRENGTH - Forearm Supinators   Sit with your right / left forearm supported on a table, keeping your elbow below shoulder height. Rest your hand over the edge, palm down.  Gently grip a hammer or a soup ladle.  Without moving your elbow, slowly turn your palm and hand upward to a "thumbs-up" position.  Hold this position for __________ seconds. Slowly return to the starting position. Repeat __________ times. Complete this exercise __________ times per day.  STRENGTH - Forearm Pronators  Sit with your right / left forearm supported on a table, keeping your elbow below shoulder height. Rest your hand over the edge, palm up.  Gently grip a hammer or a soup ladle.  Without moving your elbow, slowly turn your palm and hand upward to a "thumbs-up" position.  Hold this position for __________ seconds. Slowly return to the starting position. Repeat __________ times. Complete this exercise __________ times per day.  Document Released: 02/27/2005 Document Revised: 05/22/2011 Document Reviewed: 06/11/2008 [facility] Patient Information 2015 [place], [place]. This information is not intended to replace advice given to you by your health care  provider. Make sure you discuss any questions you have with your health care provider.

## 2013-09-18 NOTE — Progress Notes (Signed)
[facility] [address] [place], [place] [address] Phone: [phone] Subjective:    I'm seeing this patient by the request  of:  Unice Cobble, MD Dr. Jenny Reichmann.   CC: Right elbow pain  [id number] Ronald Stafford is a 80 y.o. male coming in with complaint of right elbow pain. Patient unfortunately did fall he states and tried to catch himself at the elbow 4 days ago. Patient states at that time this elbow pain seems to be getting worse and worse. Patient states that at first he did not have any type of pain but now he is having difficulty straightening it. Patient did have a sling at home and has been wearing them for the last 24 hours with some improvement. Patient did initially have x-rays. X-rays were reviewed by me. X-rays do not show any bony abnormality. Patient describes it as a dull aching pain that can be sharp with certain movements. Denies any radiation down the arm or any numbness. Patient though states that it's been weak when he has even attempted to try to pick up his 10 pound dog he was unable to do it and had to drop his dog immediately.     Past medical history, social, surgical and family history all reviewed in electronic medical record.   Review of Systems: No headache, visual changes, nausea, vomiting, diarrhea, constipation, dizziness, abdominal pain, skin rash, fevers, chills, night sweats, weight loss, swollen lymph nodes, body aches, joint swelling, muscle aches, chest pain, shortness of breath, mood changes.   Objective Blood pressure 140/78, pulse 85, height 6\' 2"  (1.88 m), weight 179 lb (81.194 kg), SpO2 96.00%.  General: No apparent distress alert and oriented x3 mood and affect normal, dressed appropriately.  HEENT: Pupils equal, extraocular movements intact  Respiratory: Patient's speak in full sentences and does not appear short of breath  Cardiovascular: No lower extremity edema, non tender, no erythema  Skin: Warm dry intact  with no signs of infection or rash on extremities or on axial skeleton.  Abdomen: Soft nontender  Neuro: Cranial nerves II through XII are intact, neurovascularly intact in all extremities with 2+ DTRs and 2+ pulses.  Lymph: No lymphadenopathy of posterior or anterior cervical chain or axillae bilaterally.  Gait normal with good balance and coordination.  MSK:  Non tender with full range of motion and good stability and symmetric strength and tone of shoulders, , wrist, hip, knee and ankles bilaterally.  Elbow: Right Unremarkable to inspection. Range of motion is decreased lacking the last 5 of pronation and supination as well as the last 10 of flexion but full extension. Strength is full to all of the above directions Stable to varus, valgus stress. Negative moving valgus stress test. Severely to the radial head Ulnar nerve does not sublux. Negative cubital tunnel Tinel's. Contralateral elbow is unremarkable  Musculoskeletal ultrasound was performed and interpreted by Charlann Boxer D.O.   Elbow: Right Lateral epicondyle and common extensor tendon origin visualized.  No edema, effusions, or avulsions seen.  Radial head does have what appears to be a significant fracture. There is significant hypoechoic changes and there does appear to be a cortical defect. Patient also has 0.16 cm of water seems to be depression in one area but otherwise very minimally displaced anywhere else. Medial epicondyle and common flexor tendon origin visualized.  No edema, effusions, or avulsions seen. Ulnar nerve in cubital tunnel unremarkable. Olecranon and triceps insertion visualized and unremarkable without edema, effusion, or avulsion.  No signs olecranon bursitis. Power doppler signal normal.  IMPRESSION:  Radial head fracture       Impression and Recommendations:     This case required medical decision making of moderate complexity.

## 2013-09-18 NOTE — Assessment & Plan Note (Signed)
Patient was put in a sling today. We discussed icing per: Tylenol for pain relief. Patient will avoid anti-inflammatories to 2 and possibly slightly numb bone healing. Starting in 48 hours the patient will come out of the sling and do some range of motion exercises. Patient was given a handout but will avoid any heavy lifting until I see patient again in 2 weeks' time. We discussed if any worsening pain that he needs to seek medical attention immediately. Otherwise patient will likely heal fairly well with conservative therapy. We will discuss vitamin D supplementation at the next followup.

## 2013-09-19 ENCOUNTER — Ambulatory Visit
Admission: RE | Admit: 2013-09-19 | Discharge: 2013-09-19 | Disposition: A | Discharge status: Home / Self Care | Payer: Medicare Other | Source: Ambulatory Visit | Attending: Orthopaedic Surgery | Admitting: Orthopaedic Surgery

## 2013-09-19 DIAGNOSIS — M545 Low back pain, unspecified: Secondary | ICD-10-CM

## 2013-09-19 MED ORDER — GADOBENATE DIMEGLUMINE 529 MG/ML IV SOLN
15.0000 mL | Freq: Once | INTRAVENOUS | Status: AC | PRN
  Administered 2013-09-19: 15 mL via INTRAVENOUS

## 2013-09-21 NOTE — Assessment & Plan Note (Signed)
stable overall by history and exam, recent data reviewed with pt, and pt to continue medical treatment as before,  to f/u any worsening symptoms or concerns BP Readings from Last 3 Encounters:  09/18/13 140/78  09/16/13 150/80  07/15/13 154/70

## 2013-09-21 NOTE — Assessment & Plan Note (Signed)
Stable, for f/u surgury next wk

## 2013-09-22 ENCOUNTER — Other Ambulatory Visit: Payer: Self-pay

## 2013-09-22 MED ORDER — OMEPRAZOLE 20 MG PO CPDR: 20.0000 mg | DELAYED_RELEASE_CAPSULE | Freq: Every day | ORAL | Status: DC

## 2013-10-03 ENCOUNTER — Other Ambulatory Visit (INDEPENDENT_AMBULATORY_CARE_PROVIDER_SITE_OTHER): Payer: Medicare Other

## 2013-10-03 ENCOUNTER — Ambulatory Visit (INDEPENDENT_AMBULATORY_CARE_PROVIDER_SITE_OTHER)
Admission: RE | Admit: 2013-10-03 | Discharge: 2013-10-03 | Disposition: A | Discharge status: Home / Self Care | Payer: Medicare Other | Source: Ambulatory Visit | Attending: Family Medicine | Admitting: Family Medicine

## 2013-10-03 ENCOUNTER — Ambulatory Visit (INDEPENDENT_AMBULATORY_CARE_PROVIDER_SITE_OTHER): Payer: Medicare Other | Admitting: Family Medicine

## 2013-10-03 ENCOUNTER — Encounter: Payer: Self-pay | Admitting: Family Medicine

## 2013-10-03 VITALS — BP 142/76 | HR 97 | Ht 74.0 in | Wt 180.0 lb

## 2013-10-03 DIAGNOSIS — S5290XD Unspecified fracture of unspecified forearm, subsequent encounter for closed fracture with routine healing: Secondary | ICD-10-CM

## 2013-10-03 DIAGNOSIS — M25529 Pain in unspecified elbow: Secondary | ICD-10-CM

## 2013-10-03 DIAGNOSIS — S52121D Displaced fracture of head of right radius, subsequent encounter for closed fracture with routine healing: Secondary | ICD-10-CM

## 2013-10-03 DIAGNOSIS — M25521 Pain in right elbow: Secondary | ICD-10-CM

## 2013-10-03 NOTE — Progress Notes (Signed)
  [facility] [address] [place], [place] [address] Phone: [phone] Subjective:     CC: Right elbow pain followup.  [name] Ronald Stafford is a 80 y.o. male coming in with complaint of right elbow pain.  Patient had an acute fall with negative x-rays but positive ultrasound for a radial fracture. Patient was given a sling for comfort for 48 hours and given home exercise program. Patient was to do icing as well. Patient states that the pain is almost completely resolved. Patient has increased his range of motion. This is a dull aching sensation with certain activities but overall doing much more. Patient has started working again which she is enjoying. Not doing really any lifting no. Denies any radiation of pain or any numbness. States that he is resting comfortably.     Past medical history, social, surgical and family history all reviewed in electronic medical record.   Review of Systems: No headache, visual changes, nausea, vomiting, diarrhea, constipation, dizziness, abdominal pain, skin rash, fevers, chills, night sweats, weight loss, swollen lymph nodes, body aches, joint swelling, muscle aches, chest pain, shortness of breath, mood changes.   Objective Blood pressure 142/76, pulse 97, height 6\' 2"  (1.88 m), weight 180 lb (81.647 kg), SpO2 97.00%.  General: No apparent distress alert and oriented x3 mood and affect normal, dressed appropriately.  HEENT: Pupils equal, extraocular movements intact  Respiratory: Patient's speak in full sentences and does not appear short of breath  Cardiovascular: No lower extremity edema, non tender, no erythema  Skin: Warm dry intact with no signs of infection or rash on extremities or on axial skeleton.  Abdomen: Soft nontender  Neuro: Cranial nerves II through XII are intact, neurovascularly intact in all extremities with 2+ DTRs and 2+ pulses.  Lymph: No lymphadenopathy of posterior or anterior cervical chain  or axillae bilaterally.  Gait normal with good balance and coordination.  MSK:  Non tender with full range of motion and good stability and symmetric strength and tone of shoulders, , wrist, hip, knee and ankles bilaterally.  Elbow: Right Unremarkable to inspection. Range of motion is increased with full range of motion in flexion, extension supination and pronation. Strength is full to all of the above directions Stable to varus, valgus stress. Negative moving valgus stress test. Minimal tenderness still over radial head Ulnar nerve does not sublux. Negative cubital tunnel Tinel's. Contralateral elbow is unremarkable  Musculoskeletal ultrasound was performed and interpreted by Charlann Boxer D.O.   Elbow: Right Lateral epicondyle and common extensor tendon origin visualized.  No edema, effusions, or avulsions seen.  Radial head has significant decrease of hypoechoic changes. There does still appear to be a small cortical defect but the depression that was seen previously is completely resolved. Medial epicondyle and common flexor tendon origin visualized.  No edema, effusions, or avulsions seen. Ulnar nerve in cubital tunnel unremarkable. Olecranon and triceps insertion visualized and unremarkable without edema, effusion, or avulsion.  No signs olecranon bursitis. Power doppler signal normal.  IMPRESSION:  Radial head fracture healing appropriately    Impression and Recommendations:     This case required medical decision making of moderate complexity.

## 2013-10-03 NOTE — Assessment & Plan Note (Signed)
Patient is doing very well 2 weeks after her fall. Discussed with patient though to continue to watch the amount of weight lifting with this arm. Patient will continue the exercises 3 times a week for another 4 weeks. We discussed how icing can be beneficial. Patient will get x-rays one more time just to make sure there is no further depression. Patient come back again in 4 weeks for further evaluation and treatment.

## 2013-10-03 NOTE — Patient Instructions (Signed)
Good to see you Ronald Stafford is your friend.  Exercises 3 times a week for another month.  Xrays downstairs but no news is good news.  Come back in 3-4 weeks if any pain.

## 2013-12-18 ENCOUNTER — Ambulatory Visit (INDEPENDENT_AMBULATORY_CARE_PROVIDER_SITE_OTHER)
Admission: RE | Admit: 2013-12-18 | Discharge: 2013-12-18 | Disposition: A | Discharge status: Home / Self Care | Payer: Medicare Other | Source: Ambulatory Visit | Attending: Internal Medicine | Admitting: Internal Medicine

## 2013-12-18 ENCOUNTER — Ambulatory Visit (INDEPENDENT_AMBULATORY_CARE_PROVIDER_SITE_OTHER): Payer: Medicare Other | Admitting: Internal Medicine

## 2013-12-18 ENCOUNTER — Encounter: Payer: Self-pay | Admitting: Internal Medicine

## 2013-12-18 VITALS — BP 148/70 | HR 95 | Temp 98.3°F | Resp 13 | Wt 184.1 lb

## 2013-12-18 DIAGNOSIS — M21961 Unspecified acquired deformity of right lower leg: Secondary | ICD-10-CM

## 2013-12-18 DIAGNOSIS — M216X1 Other acquired deformities of right foot: Secondary | ICD-10-CM

## 2013-12-18 DIAGNOSIS — Z23 Encounter for immunization: Secondary | ICD-10-CM

## 2013-12-18 NOTE — Patient Instructions (Signed)
I recommend you see Dr Charlann Boxer, Sports Medicine specialist., referral made

## 2013-12-18 NOTE — Progress Notes (Signed)
Pre visit review using our clinic review tool, if applicable. No additional management support is needed unless otherwise documented below in the visit note. 

## 2013-12-18 NOTE — Progress Notes (Signed)
   Subjective:    Patient ID: BRON SNELLINGS, male    DOB: 1930/03/27, 80 y.o.   MRN: [id number]  HPI  He has had chronic recurrent pain and swelling in the right  ankle.  He relates this to water skiing years ago. After this activity the ankle would  be swollen requiring him to pack it in ice.  The last 2-3 years there has been instability in the ankle. He states that this has affected his gait & has resulted in falls  The symptoms have been exacerbated by involvement in water aerobics recently.There is swelling after such activities.    Review of Systems  He denies localized redness or change in temperature of the skin  He's had no constitutional symptoms with this  There is no numbness, tingling, or weakness in the right lower extremity.  He's had no associated loss of control of bladder or bowels.        Objective:   Physical Exam  There is asymmetry of the lateral malleoli. There appears to be a dual component to the right lateral malleolus. It feels as if there is an osteoma anterior to the lateral malleolus which is tender to direct palpation and with inversion of the foot  He walks  on the lateral aspect of the sole. With the foot inverted  The right dorsalis pedis pulses decreased. There are no ischemic changes  Nail health appears to be good.  Deep tendon reflexes, strength, tone extremities are normal.       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 ? Calcified tendon anterior to R lateral malleolus  Plan: Films ordered; he'll be referred to Dr. Gardenia Phlegm.

## 2013-12-31 ENCOUNTER — Ambulatory Visit (INDEPENDENT_AMBULATORY_CARE_PROVIDER_SITE_OTHER): Payer: Medicare Other | Admitting: Family Medicine

## 2013-12-31 ENCOUNTER — Encounter: Payer: Self-pay | Admitting: Family Medicine

## 2013-12-31 ENCOUNTER — Other Ambulatory Visit (INDEPENDENT_AMBULATORY_CARE_PROVIDER_SITE_OTHER): Payer: Medicare Other

## 2013-12-31 VITALS — BP 142/80 | HR 81 | Ht 74.0 in | Wt 176.0 lb

## 2013-12-31 DIAGNOSIS — M25371 Other instability, right ankle: Secondary | ICD-10-CM | POA: Insufficient documentation

## 2013-12-31 DIAGNOSIS — M25571 Pain in right ankle and joints of right foot: Secondary | ICD-10-CM

## 2013-12-31 DIAGNOSIS — R2241 Localized swelling, mass and lump, right lower limb: Secondary | ICD-10-CM

## 2013-12-31 NOTE — Progress Notes (Signed)
[facility] [address], [place] [address] Phone: [phone] Subjective:    I'm seeing this patient by the request  of:  Unice Cobble, MD  CC: Right ankle pain  FMB:[id number] Ronald Stafford is a 80 y.o. male coming in with complaint of right ankle pain and swelling. Patient was seen by primary care provider previously approximately 2 weeks ago. Patient states since he was even a teenager he had significant difficulty with this ankle. Patient states it is starting to get him more discomfort over the course of the last several years in significantly more in the last several months. Patient states that he feels very unstable and if he steps on anything even small he rolls his right ankle and falls. Patient has been trying to do such things as water aerobics but if he is interested jump in the pool he rolls his ankle. Patient does have a past medical history significant for back pain but states that there seems to be no association. Patient states that he has had some enlargement on the lateral aspect of his ankle and states that it might have increased in size slowly. Patient denies any fevers or chills or any abnormal weight loss. Denies any redness or warmth to touch. Patient does not feel that he is weak in this foot or ankle. Rates the severity of 6/10 at all times and when he rolls the ankle 9/10. Patient is concerned because he does have a hip arthrosis on the same side as her he had a revision and he does not want a fall on his hip.  X-rays were reviewed by me and shows some mild to moderate osteophytic changes but no specific bony abnormality.    Past medical history, social, surgical and family history all reviewed in electronic medical record.   Review of Systems: No headache, visual changes, nausea, vomiting, diarrhea, constipation, dizziness, abdominal pain, skin rash, fevers, chills, night sweats, weight loss, swollen lymph nodes, body aches, joint  swelling, muscle aches, chest pain, shortness of breath, mood changes.   Objective Blood pressure 142/80, weight 176 lb (79.833 kg).  General: No apparent distress alert and oriented x3 mood and affect normal, dressed appropriately.  HEENT: Pupils equal, extraocular movements intact  Respiratory: Patient's speak in full sentences and does not appear short of breath  Cardiovascular: No lower extremity edema, non tender, no erythema  Skin: Warm dry intact with no signs of infection or rash on extremities or on axial skeleton.  Abdomen: Soft nontender  Neuro: Cranial nerves II through XII are intact, neurovascularly intact in all extremities with 2+ DTRs and 2+ pulses.  Lymph: No lymphadenopathy of posterior or anterior cervical chain or axillae bilaterally.  Gait fairly normal but patient is careful with swinging patient's right leg forward before stepping down. Mild antalgic gait.  MSK:  Non tender with full range of motion and good stability and symmetric strength and tone of shoulders, elbows, wrist, hip, knee and  bilaterally.  Ankle: Right Patient has significant enlargement of the bony abnormality of the distal lateral malleolus. Patient is nontender in the middle of the bone but is tender over the medial and lateral aspects of the malleolus. No crepitus noted. At rest patient does have his foot in a supinated state. Patient has complete loss of the lateral stability. Positive anterior drawer test Range of motion is full in all directions. Strength is 5/5 in all directions. Talar dome nontender; No pain at base of 5th  MT; No tenderness over cuboid; No tenderness over N spot or navicular prominence No sign of peroneal tendon subluxations or tenderness to palpation Negative tarsal tunnel tinel's Able to walk 4 steps. Contralateral ankle unremarkable  MSK US performed of: right This study was ordered, performed, and interpreted by Charlann Boxer D.O.  Foot/Ankle:   All structures  visualized.   Talar dome heart osteophytic changes Ankle mortise with minimal effusion. Peroneus longus and brevis tendons unremarkable on long and transverse views but does have some tendon sheath effusion Posterior tibialis, flexor hallucis longus, and flexor digitorum longus tendons unremarkable on long and transverse views without sheath effusions. Achilles tendon visualized along length of tendon and unremarkable on long and transverse views without sheath effusion. Anterior Talofibular Ligament and Calcaneofibular ligaments are not identified. Deltoid Ligament unremarkable and intact. Plantar fascia intact and without effusion, normal thickness. No increased doppler signal, cap sign, or thickening of tibial cortex. Power doppler signal normal.  IMPRESSION:  Lateral instability with underlying osteoarthritic changes      Impression and Recommendations:     This case required medical decision making of moderate complexity.

## 2013-12-31 NOTE — Patient Instructions (Signed)
Invest in a shoehorn.  I think you have severe instability of your ankle Wear the brace daily Ice 10 minutes at night  MRI ordered and we will see you again 1-2 days after. And discuss.

## 2013-12-31 NOTE — Assessment & Plan Note (Signed)
Patient does have complete loss of stability with an anterior drawer test and patient's foot in a supinated position at rest. This would make it very easy for inversion ankle sprains repetitively. Patient was put in a stability ankle brace today they'll be easy for patient to get on and off and give him some support. We discussed that he can wear this most if not all the time. We discussed the possibility of home exercises for strengthening but first we will evaluate further with the MRI to further evaluate the right ankle mass. We discussed an icing protocol at night. Patient wearing good shoes to get some good stability in a rigid sole. Patient then come back after the MRI to discuss further and we'll check on patient's progression. Patient has done formal physical therapy for this in the past.  Differential also includes intermittent foot drop secondary to radicular symptoms in the back the patient has good range of motion and strength today.

## 2013-12-31 NOTE — Assessment & Plan Note (Signed)
The patient does have what appears to be a right ankle mass. I do think that this is likely just secondary to the osteophytic changes in the lateral instability causing hypertrophy and an osteoma over the lateral malleolus. That said unfortunately the differential also includes a sarcoma with a possibly increasing in size he states over the course of the last months. Patient does not have any systemic findings. I would like to get further imaging and an MRI is ordered to further evaluate. Patient will come back when to 2 days afterwards and we'll discuss findings. Depending on findings this will guide medical management.

## 2014-01-07 ENCOUNTER — Other Ambulatory Visit: Payer: Self-pay | Admitting: Internal Medicine

## 2014-01-08 ENCOUNTER — Ambulatory Visit
Admission: RE | Admit: 2014-01-08 | Discharge: 2014-01-08 | Disposition: A | Discharge status: Home / Self Care | Payer: Medicare Other | Source: Ambulatory Visit | Attending: Family Medicine | Admitting: Family Medicine

## 2014-01-08 DIAGNOSIS — M25571 Pain in right ankle and joints of right foot: Secondary | ICD-10-CM

## 2014-01-12 ENCOUNTER — Ambulatory Visit (INDEPENDENT_AMBULATORY_CARE_PROVIDER_SITE_OTHER): Payer: Medicare Other | Admitting: Family Medicine

## 2014-01-12 ENCOUNTER — Encounter: Payer: Self-pay | Admitting: Family Medicine

## 2014-01-12 ENCOUNTER — Other Ambulatory Visit (INDEPENDENT_AMBULATORY_CARE_PROVIDER_SITE_OTHER): Payer: Medicare Other

## 2014-01-12 VITALS — BP 138/82 | HR 87 | Ht 74.0 in | Wt 186.0 lb

## 2014-01-12 DIAGNOSIS — M7671 Peroneal tendinitis, right leg: Secondary | ICD-10-CM | POA: Insufficient documentation

## 2014-01-12 DIAGNOSIS — M25572 Pain in left ankle and joints of left foot: Secondary | ICD-10-CM

## 2014-01-12 MED ORDER — DICLOFENAC SODIUM 2 % TD SOLN: TRANSDERMAL | Status: DC

## 2014-01-12 NOTE — Progress Notes (Signed)
[facility] [address], [place] [address] Phone: [phone] Subjective:    CC: Right ankle pain follow-up  Ronald Stafford is a 80 y.o. male coming in with complaint of right ankle pain and swelling. Patient was found to have a calcific mass over the lateral malleolus and near the peroneal tendons. Patient had nonspecific hypoechoic changes on the ultrasound and was sent for an MRI for further evaluation. Patient's MRI showed tenosynovitis of the peroneal tendons as well as focal tendinosis of the extensor digitorum tendon at the lateral aspect of the distal talus with calcification of the tendon.    Past medical history, social, surgical and family history all reviewed in electronic medical record.   Review of Systems: No headache, visual changes, nausea, vomiting, diarrhea, constipation, dizziness, abdominal pain, skin rash, fevers, chills, night sweats, weight loss, swollen lymph nodes, body aches, joint swelling, muscle aches, chest pain, shortness of breath, mood changes.   Objective Blood pressure 138/82, pulse 87, height 6\' 2"  (1.88 m), weight 186 lb (84.369 kg), SpO2 97 %.  General: No apparent distress alert and oriented x3 mood and affect normal, dressed appropriately.  HEENT: Pupils equal, extraocular movements intact  Respiratory: Patient's speak in full sentences and does not appear short of breath  Cardiovascular: No lower extremity edema, non tender, no erythema  Skin: Warm dry intact with no signs of infection or rash on extremities or on axial skeleton.  Abdomen: Soft nontender  Neuro: Cranial nerves II through XII are intact, neurovascularly intact in all extremities with 2+ DTRs and 2+ pulses.  Lymph: No lymphadenopathy of posterior or anterior cervical chain or axillae bilaterally.  Gait fairly normal but patient is careful with swinging patient's right leg forward before stepping down. Mild antalgic gait.  MSK:  Non  tender with full range of motion and good stability and symmetric strength and tone of shoulders, elbows, wrist, hip, knee and  bilaterally.  Ankle: Right Patient has significant enlargement of the bony abnormality of the distal lateral malleolusbut it may be somewhat smaller than previous exam.. Patient is nontender in the middle of the bone but is tender over the medial and lateral aspects of the malleolus. No crepitus noted. At rest patient does have his foot in a supinated state. Patient has complete loss of the lateral stability. Positive anterior drawer test Range of motion is full in all directions. Strength is 5/5 in all directions. Talar dome nontender; No pain at base of 5th MT; No tenderness over cuboid; No tenderness over N spot or navicular prominence No sign of peroneal tendon subluxations or tenderness to palpation Negative tarsal tunnel tinel's Able to walk 4 steps. Contralateral ankle unremarkable Procedure: Real-time Ultrasound Guided Injection of peroneal tendon Device: GE Logiq E  Ultrasound guided injection is preferred based studies that show increased duration, increased effect, greater accuracy, decreased procedural pain, increased response rate, and decreased cost with ultrasound guided versus blind injection.  Verbal informed consent obtained.  Time-out conducted.  Noted no overlying erythema, induration, or other signs of local infection.  Skin prepped in a sterile fashion.  Local anesthesia: Topical Ethyl chloride.  With sterile technique and under real time ultrasound guidance:  With a 25-gauge 1 inch needle patient was injected with 0.5 mL of 0.5% Marcainehis of 0.5 mL of Kenalog 40 mg/dL within the peroneal tendon sheath. Completed without difficulty  Pain immediately resolved suggesting accurate placement of the medication.  Advised to call if fevers/chills, erythema,  induration, drainage, or persistent bleeding.  Images permanently stored and available for  review in the ultrasound unit.  Impression: Technically successful ultrasound guided injection.      Impression and Recommendations:     This case required medical decision making of moderate complexity.

## 2014-01-12 NOTE — Patient Instructions (Addendum)
Good to see you The tendons calcified to try to give you more support because you do not have any ligaments left Continue the brace with a lot of walking Try the pennsaid twice daily as needed Did injection today New exercises 3 times a week.  See me again in 3 weeks to see how you are doing.

## 2014-01-12 NOTE — Assessment & Plan Note (Signed)
With patient's MRI not showing any signs of an osteosarcoma and only peroneal tendinitis patient was given an injection today in the peroneal tendon sheath. Patient tolerated the procedure very well. Patient will continue to wear the lateral ankle stabilizing brace with any type of significant walking. Patient was given different exercises to try to do 3 times a week as well as given topical anti-inflammatories to try for pain relief when it occurs. Patient will try to do these different interventions and an come back and see me again in 3 weeks for further evaluation and treatment.  Spent greater than 25 minutes with patient face-to-face and had greater than 50% of counseling including as described above in assessment and plan.

## 2014-02-26 ENCOUNTER — Telehealth: Payer: Self-pay | Admitting: Internal Medicine

## 2014-02-26 NOTE — Telephone Encounter (Signed)
Pt called in and said that he now has a leakage and would like to know what Dr Linna Darner would like to give him for it or what should he do?

## 2014-02-26 NOTE — Telephone Encounter (Signed)
He would need to be seen with all actual med bottles

## 2014-02-26 NOTE — Telephone Encounter (Signed)
Patient has been advised and transferred to scheduling for an appt

## 2014-02-27 ENCOUNTER — Ambulatory Visit (INDEPENDENT_AMBULATORY_CARE_PROVIDER_SITE_OTHER): Payer: Medicare Other | Admitting: Internal Medicine

## 2014-02-27 ENCOUNTER — Encounter: Payer: Self-pay | Admitting: Internal Medicine

## 2014-02-27 VITALS — BP 150/84 | HR 80 | Temp 98.3°F | Wt 185.5 lb

## 2014-02-27 DIAGNOSIS — N393 Stress incontinence (female) (male): Secondary | ICD-10-CM

## 2014-02-27 NOTE — Patient Instructions (Signed)
   If the samples do not resolve ear problem in the next 3 weeks; you should see your urologist for definitive bladder therapy.

## 2014-02-27 NOTE — Progress Notes (Signed)
Pre visit review using our clinic review tool, if applicable. No additional management support is needed unless otherwise documented below in the visit note. 

## 2014-02-27 NOTE — Progress Notes (Signed)
   Subjective:    Patient ID: Ronald Stafford, male    DOB: 12/18/1930, 80 y.o.   MRN: [id number]  HPI  He has had progressive urine "leakage" for 4-5 weeks. 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 He also has nocturia 2  He must wear a pad because the symptoms  He had a prostatectomy 2004 followed by radiation  He has no other constitutional or genitourinary symptoms.  Review of Systems   He specifically denies fever, chills, sweats, weight loss to  He denies any abdominal distention  He has no urgency, hesitancy, dysuria, pyuria, or hematuria.     Objective:   Physical Exam  Pertinent or positive findings include: He's wearing a soft brace for his back ;he also has a brace at the right ankle Pedal pulses are decreased but present.   General appearance :adequately nourished; in no distress. Appears younger than stated age Eyes: No conjunctival inflammation or scleral icterus is present. Heart:  Normal rate and regular rhythm. S1 and S2 normal without gallop, murmur, click, rub or other extra sounds   Lungs:Chest clear to auscultation; no wheezes, rhonchi,rales ,or rubs present.No increased work of breathing.  Abdomen: bowel sounds normal, soft and non-tender without masses, organomegaly or hernias noted.  No guarding or rebound. No flank tenderness to percussion. Vascular : all pulses equal ; no bruits present. Skin:Warm & dry.  Intact without suspicious lesions or rashes ; no jaundice or tenting Lymphatic: No lymphadenopathy is noted about the head, neck, axilla DRE not performed as S/P prostatectomy.           Assessment & Plan:  #1 stress incontinence  Plan: He was given samples of Myrbetriq 25 mg (lot number [id number]; expiration rate 8/17 [place] [address] #21.  If symptoms fail to resolve; he should see his Urologist for bladder therapy.

## 2014-03-17 ENCOUNTER — Encounter: Payer: Self-pay | Admitting: Internal Medicine

## 2014-03-17 ENCOUNTER — Ambulatory Visit (INDEPENDENT_AMBULATORY_CARE_PROVIDER_SITE_OTHER): Payer: Medicare Other | Admitting: Internal Medicine

## 2014-03-17 VITALS — BP 168/72 | HR 91 | Temp 97.6°F | Ht 74.0 in | Wt 184.4 lb

## 2014-03-17 DIAGNOSIS — R05 Cough: Secondary | ICD-10-CM

## 2014-03-17 DIAGNOSIS — R059 Cough, unspecified: Secondary | ICD-10-CM

## 2014-03-17 DIAGNOSIS — J310 Chronic rhinitis: Secondary | ICD-10-CM

## 2014-03-17 MED ORDER — AMOXICILLIN 500 MG PO CAPS: 500.0000 mg | ORAL_CAPSULE | Freq: Three times a day (TID) | ORAL | Status: DC

## 2014-03-17 NOTE — Progress Notes (Signed)
   Subjective:    Patient ID: Ronald Stafford, male    DOB: 11-07-1930, 81 y.o.   MRN: [id number]  HPI    Symptoms began 03/08/14 as sore throat after singing in the [place] . Subsequently he developed chest and head congestion. The head congestion was more severe than the chest. Symptoms initially progressed; but they have responded to Delsym and Mucinex  He describes minor sneezing and slight shortness of breath which is not significant.  At this time he denies any significant upper respiratory tract or lower tract infection symptoms. He also denies significant extrinsic symptoms.  Review of Systems Frontal headache, facial pain , nasal purulence, dental pain, sore throat , otic pain or otic discharge denied @ this time. No fever , chills or sweats.Extrinsic symptoms of itchy, watery eyes,  or angioedema are denied. There is no significant cough, sputum production, wheezing,or  paroxysmal nocturnal dyspnea.    Objective:   Physical Exam  General appearance:good health ;well nourished; no acute distress or increased work of breathing is present.  No  lymphadenopathy about the head, neck, or axilla noted.   Eyes: No conjunctival inflammation or lid edema is present. There is no scleral icterus.  Ears:  External ear exam shows no significant lesions or deformities.  Otoscopic examination reveals clear canals, tympanic membranes are intact bilaterally without bulging, retraction, inflammation or discharge.  Nose:  External nasal examination shows no deformity or inflammation. Nasal mucosa erythematous on R  without lesions or exudates. Leftward septal dislocation   Oral exam: Dental hygiene is good; lips and gums are healthy appearing.There is no oropharyngeal erythema or exudate noted.   Neck:  No deformities, thyromegaly, masses, or tenderness noted.   Supple with full range of motion without pain.   Heart:  Normal rate and regular rhythm. S1 and S2 normal without gallop, murmur, click,  rub or other extra sounds.   Lungs:Chest clear to auscultation; no wheezes, rhonchi,rales ,or rubs present.No increased work of breathing.    Extremities:  No cyanosis, edema, or clubbing  noted    Skin: Warm & dry w/o jaundice or tenting.       Assessment & Plan:  #1 non allergic rhinitis in context of possible resolving viral URI #2 cough See Orders & AVS

## 2014-03-17 NOTE — Progress Notes (Signed)
Pre visit review using our clinic review tool, if applicable. No additional management support is needed unless otherwise documented below in the visit note. 

## 2014-03-17 NOTE — Patient Instructions (Signed)
Plain Mucinex (NOT D) for thick secretions ;force NON dairy fluids .   Nasal cleansing in the shower as discussed with lather of mild shampoo.After 10 seconds wash off lather while  exhaling through nostrils. Make sure that all residual soap is removed to prevent irritation.  Flonase OR Nasacort AQ 1 spray in each nostril twice a day as needed. Use the "crossover" technique into opposite nostril spraying toward opposite ear @ 45 degree angle, not straight up into nostril.  Plain Allegra (NOT D )  160 daily , Loratidine 10 mg , OR Zyrtec 10 mg @ bedtime  as needed for itchy eyes & sneezing.  Zicam Melts or Zinc lozenges as per package label for sore throat . Complementary options include  vitamin C 2000 mg daily; & Echinacea for 4-7 days.     Fill the  prescription for antibiotic it there is persistent or progressive fever; discolored nasal or chest secretions; or frontal headache or facial  pain.

## 2014-04-22 ENCOUNTER — Ambulatory Visit (INDEPENDENT_AMBULATORY_CARE_PROVIDER_SITE_OTHER): Payer: Medicare Other | Admitting: Internal Medicine

## 2014-04-22 ENCOUNTER — Encounter: Payer: Self-pay | Admitting: Internal Medicine

## 2014-04-22 VITALS — BP 152/72 | HR 101 | Temp 97.9°F | Ht 74.0 in | Wt 181.1 lb

## 2014-04-22 DIAGNOSIS — J209 Acute bronchitis, unspecified: Secondary | ICD-10-CM

## 2014-04-22 MED ORDER — AMOXICILLIN 500 MG PO CAPS: 500.0000 mg | ORAL_CAPSULE | Freq: Three times a day (TID) | ORAL | Status: DC

## 2014-04-22 MED ORDER — HYDROCODONE-HOMATROPINE 5-1.5 MG/5ML PO SYRP: 5.0000 mL | ORAL_SOLUTION | Freq: Four times a day (QID) | ORAL | Status: DC | PRN

## 2014-04-22 NOTE — Patient Instructions (Signed)

## 2014-04-22 NOTE — Progress Notes (Signed)
Pre visit review using our clinic review tool, if applicable. No additional management support is needed unless otherwise documented below in the visit note. 

## 2014-04-22 NOTE — Progress Notes (Signed)
   Subjective:    Patient ID: Ronald Stafford, male    DOB: 31-May-1930, 81 y.o.   MRN: [id number]  HPI  His symptoms began 04/16/14 as a sore throat. As of 2/5 he was somewhat improved until that evening. That evening he developed chest congestion and cough associated with pressure over the crown of his head. The cough also is associated with discomfort in the left chest with the cough. He has taken Delsym with only partial response.  He's had temperature elevation up to 101 associated with chills and sweats.  He describes rhinitis with yellow discharge. The cough is productive of yellow sputum and greater volume than the nasal discharge.  Review of Systems He denies frontal headache, facial pain, dental pain, otic pain, otic discharge  His no extrinsic symptoms of itchy, watery eyes, or sneezing.  The cough is not associated with shortness of breath or wheezing.    Objective:   Physical Exam Pertinent or positive findings include: There is erythema of the right nare greater than the left. He has upper and lower partials. An S4 is noted. He has a non-productive minimally rattly cough.  General appearance:Adequately nourished; no acute distress or increased work of breathing is present.  No  lymphadenopathy about the head, neck, or axilla noted.  Eyes: No conjunctival inflammation or lid edema is present. There is no scleral icterus. Ears:  External ear exam shows no significant lesions or deformities.  Otoscopic examination reveals clear canals, tympanic membranes are intact bilaterally without bulging, retraction, inflammation or discharge.TMs are dull Nose:  External nasal examination shows no deformity or inflammation.  No obstruction to airflow.  Oral exam:  lips and gums are healthy appearing.There is no oropharyngeal erythema or exudate noted.  Neck:  No deformities, thyromegaly, masses, or tenderness noted.   Supple with full range of motion without pain.  Heart:  Normal rate and  regular rhythm. S1 and S2 normal without gallop, murmur, click,or rub  Lungs:Chest clear to auscultation; no wheezes, rhonchi,rales ,or rubs present. Extremities:  No cyanosis, edema, or clubbing  noted  Skin: Warm & dry w/o jaundice or tenting.        Assessment & Plan:  #1 acute bronchitis w/o bronchospasm #2 URI, acute Plan: See orders and recommendations

## 2014-07-14 ENCOUNTER — Ambulatory Visit (INDEPENDENT_AMBULATORY_CARE_PROVIDER_SITE_OTHER): Payer: Medicare Other

## 2014-07-14 VITALS — BP 160/80 | Ht 75.0 in | Wt 191.2 lb

## 2014-07-14 DIAGNOSIS — Z23 Encounter for immunization: Secondary | ICD-10-CM | POA: Diagnosis not present

## 2014-07-14 DIAGNOSIS — Z0000 Encounter for general adult medical examination without abnormal findings: Secondary | ICD-10-CM

## 2014-07-14 NOTE — Progress Notes (Signed)
Subjective:   Ronald Stafford is a 81 y.o. male who presents for Medicare Annual/Subsequent preventive examination.  Review of Systems: Problem list reviewed  Cardiac Risk Factors include: advanced age (>12men, >22 women);dyslipidemia;hypertension;male gender HRA assessment completed during visit  Health described as excellent; very good; good; fair or poor? Excellent health  Current Exercise; stays busy; works; remodels Recommended regular exercise  Current dietary; friend cooks for him; eats healthy  Psychosocial changes in the last year; moves; losses of family; Lost wife x 2 yo but has companion now that he enjoys spending time with.  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 Former Smoker -- 0.50 packs/day for 5 years  . Types: Cigarettes  . Quit date: 03/13/1988  Smokeless tobacco  . Not on file    Comment: Quit at about age 37 / chewed tobacca x 1 year     Counseling given: Not Answered   Past Medical History  Diagnosis Date  . Cancer 2004    prostate cancer, Dr. Risa Grill  . Depression   . GERD (gastroesophageal reflux disease)   . Hyperlipidemia   . Hypertension   . Deep venous thrombosis 1997    Postop  . Colon polyps   . Shoulder fracture, left 02/23/2011    immobilized in sling , Dr Norris(no surgery)   Past Surgical History  Procedure Laterality Date  . Prostatectomy  2004    w/  radiation, Dr Risa Grill  . Joint replacement  1997, 2001    Total Hip replacement X 2  . Colonoscopy  2006    Negative,Dr.Edwards  . Meckel diverticulum excision      Age 70  . Vasectomy    . Lumbar spine surgery  09/28/2010    Dr Rolena Infante; for spinal stenosis  . Colonoscopy with polypectomy       PMH of   Family History  Problem Relation Age of Onset  . Prostate cancer Father   . Testicular cancer Son    History  Sexual Activity  . Sexual Activity: Not on file    Outpatient Encounter Prescriptions as of 07/14/2014  . Order #: [id number] Class: Historical Med  . Order #: [id number] Class: Historical Med  . Order #: [id number] Class: Normal  . Order #: [id number] Class: Normal  . [DISCONTINUED] Order #: [id number] Class: Normal  . [DISCONTINUED] Order #: [id number] Class: Normal  . [DISCONTINUED] Order #: [id number] Class: Print    Activities of Daily Living In your present state of health, do you have any difficulty performing the following activities: 07/14/2014 03/17/2014  Hearing? N N  Vision? N N  Difficulty concentrating or making decisions? N N  Walking or climbing stairs? N N  Dressing or bathing? N N  Doing errands, shopping? N N  Preparing Food and eating ? N -  Using the Toilet? N -  In the past six months, have  you accidently leaked urine? N -  Do you have problems with loss of bowel control? N -  Managing your Medications? N -  Managing your Finances? N -  Housekeeping or managing your Housekeeping? N -    Patient Care Team: Hendricks Limes, MD as PCP - General Rana Snare, MD as Consulting Physician (Urology) Marybelle Killings, MD as Consulting Physician (Orthopedic Surgery)   Assessment:      CC risk: obesity (BMI); family hx; HTN; hyperlipidemia; Risk reviewed  Personalized Education given regarding: walking and inc'ing HDL Pt determined a personalized goal : enjoy life; start walking every day  Assessment included smoking hx; stopped   Fall Risk and general  Safety reviewed Assess fear of falling? no Educated on prevention falls; Exercise, toning and strengthening; Balance exercises  Comfortable shoes Regular vision checks ; will make apt for this year Home safety; removal of throw rugs; bathroom handrails;  Smoke detectors; safe environment Avoid climbing on BP medication Stress: no issues    Risk for hepatitis or high risk social behavior:No risk identified via screen  Risk for Depression: No issues  Risk for Falls:already modified for safety  Safety assessed (driving issues; vision; home; environment; support) reviewed  Cognition assessed by AD8; Score 0 (A score of 2 or greater would indicate the MMSE be completed)    Need for Immunizations or other screenings identified;  (CDC recommmend Prevnar at 65 followed by pnuemovax 23 in one year or 5 years after the last dose.  Health Maintenance up to date and a Preventive Wellness Plan was given to the patient  Exercise Activities and Dietary recommendations Current Exercise Habits:: Home exercise routine (Doesn't sit much except at hs; goes to park and walk; shop), Intensity: Moderate  Goals    . <enter goal here>     To continue to enjoy life/ Discussed walking everyday would help increase HDL       Fall Risk Fall Risk  07/14/2014 03/17/2014 02/28/2013  Falls in the past year? No Yes Yes  Number falls in past yr: - 1 2 or more  Injury with Fall? - No -  Risk for fall due to : (No Data) - -  Risk for fall due to (comments): has trilevel house; no problem; laundry on bottom floor - -   Depression Screen PHQ 2/9 Scores 07/14/2014 03/17/2014 02/28/2013  PHQ - 2 Score 0 0 0    Cognitive Testing MMSE - Mini Mental State Exam 07/14/2014  Not completed: Unable to complete    Immunization History  Administered Date(s) Administered  . Influenza Split 12/20/2010, 12/13/2011  . Influenza Whole 12/06/2007, 01/05/2009, 12/06/2009  . Influenza, High Dose Seasonal PF 12/18/2013  .  Influenza,inj,Quad PF,36+ Mos 01/01/2013  . Pneumococcal Polysaccharide-23 02/28/2013  . Tdap 06/06/2005  . Zoster 03/01/2011   Screening Tests Health Maintenance  Topic Date Due  . PNA vac Low Risk Adult (2 of 2 - PCV13) 02/28/2014  . INFLUENZA VACCINE  10/12/2014  . TETANUS/TDAP  06/07/2015  . COLONOSCOPY  03/09/2020  . ZOSTAVAX  Completed      Plan:     Plan   The patient agrees to: intentional walking every day unless it aggravates his back  Advanced directive: will bring HCPOA to office on your next visit Rec'd prevnar this visit To schedule eye exam q year; had one last year  During the course of the visit the patient was educated and counseled about the following appropriate screening and preventive services:  Vaccines to include Pneumoccal, Influenza, Hepatitis B, Td, Zostavax/ Rec'd prevnar today;   Electrocardiogram: deferred  Cardiovascular Disease: educated regarding exercise and cholesterol  Colorectal cancer screening; completed 2011; Due 02/2020  Diabetes screening;  A1c 6  Prostate Cancer Screening:see urology every year  Glaucoma screening: will schedule eye exam  Nutrition counseling; discussed; eats out or has friend who cooks   Eats out with family, friends or church; good support system from sons  Smoking cessation counseling na/  Patient Instructions (the written plan) was given to the patient.    Wynetta Fines, RN  07/14/2014

## 2014-07-14 NOTE — Patient Instructions (Addendum)
Ronald Stafford , Thank you for taking time to come for your Medicare Wellness Visit. I appreciate your ongoing commitment to your health goals. Please review the following plan we discussed and let me know if I can assist you in the future.   These are the goals we discussed: Goals    . <enter goal here>     To continue to enjoy life/ Discussed walking everyday would help increase HDL       also will use exercise equipment x 2 per week  This is a list of the screening recommended for you and due dates:  Health Maintenance  Topic Date Due  . Pneumonia vaccines (2 of 2 - PCV13) 02/28/2014  . Flu Shot  10/12/2014  . Tetanus Vaccine  06/07/2015  . Colon Cancer Screening  03/09/2020  . Shingles Vaccine  Completed   Agrees to take the Prevnar today No other immunizations needed  Will fup with Neuro regarding back if he needs to Will give patient instructions for prevention for male  Back Injury Prevention Back injuries can be extremely painful and difficult to heal. After having one back injury, you are much more likely to experience another later on. It is important to learn how to avoid injuring or re-injuring your back. The following tips can help you to prevent a back injury. PHYSICAL FITNESS  Exercise regularly and try to develop good tone in your abdominal muscles. Your abdominal muscles provide a lot of the support needed by your back.  Do aerobic exercises (walking, jogging, biking, swimming) regularly.  Do exercises that increase balance and strength (tai chi, yoga) regularly. This can decrease your risk of falling and injuring your back.  Stretch before and after exercising.  Maintain a healthy weight. The more you weigh, the more stress is placed on your back. For every pound of weight, 10 times that amount of pressure is placed on the back. DIET  Talk to your caregiver about how much calcium and vitamin D you need per day. These nutrients help to prevent weakening of the  bones (osteoporosis). Osteoporosis can cause broken (fractured) bones that lead to back pain.  Include good sources of calcium in your diet, such as dairy products, green, leafy vegetables, and products with calcium added (fortified).  Include good sources of vitamin D in your diet, such as milk and foods that are fortified with vitamin D.  Consider taking a nutritional supplement or a multivitamin if needed.  Stop smoking if you smoke. POSTURE  Sit and stand up straight. Avoid leaning forward when you sit or hunching over when you stand.  Choose chairs with good low back (lumbar) support.  If you work at a desk, sit close to your work so you do not need to lean over. Keep your chin tucked in. Keep your neck drawn back and elbows bent at a right angle. Your arms should look like the letter "L."  Sit high and close to the steering wheel when you drive. Add a lumbar support to your car seat if needed.  Avoid sitting or standing in one position for too long. Take breaks to get up, stretch, and walk around at least once every hour. Take breaks if you are driving for long periods of time.  Sleep on your side with your knees slightly bent, or sleep on your back with a pillow under your knees. Do not sleep on your stomach. LIFTING, TWISTING, AND REACHING  Avoid heavy lifting, especially repetitive lifting. If you must  do heavy lifting:  Stretch before lifting.  Work slowly.  Rest between lifts.  Use carts and dollies to move objects when possible.  Make several small trips instead of carrying 1 heavy load.  Ask for help when you need it.  Ask for help when moving big, awkward objects.  Follow these steps when lifting:  Stand with your feet shoulder-width apart.  Get as close to the object as you can. Do not try to pick up heavy objects that are far from your body.  Use handles or lifting straps if they are available.  Bend at your knees. Squat down, but keep your heels off  the floor.  Keep your shoulders pulled back, your chin tucked in, and your back straight.  Lift the object slowly, tightening the muscles in your legs, abdomen, and buttocks. Keep the object as close to the center of your body as possible.  When you put a load down, use these same guidelines in reverse.  Do not:  Lift the object above your waist.  Twist at the waist while lifting or carrying a load. Move your feet if you need to turn, not your waist.  Bend over without bending at your knees.  Avoid reaching over your head, across a table, or for an object on a high surface. OTHER TIPS  Avoid wet floors and keep sidewalks clear of ice to prevent falls.  Do not sleep on a mattress that is too soft or too hard.  Keep items that are used frequently within easy reach.  Put heavier objects on shelves at waist level and lighter objects on lower or higher shelves.  Find ways to decrease your stress, such as exercise, massage, or relaxation techniques. Stress can build up in your muscles. Tense muscles are more vulnerable to injury.  Seek treatment for depression or anxiety if needed. These conditions can increase your risk of developing back pain. SEEK MEDICAL CARE IF:  You injure your back.  You have questions about diet, exercise, or other ways to prevent back injuries. MAKE SURE YOU:  Understand these instructions.  Will watch your condition.  Will get help right away if you are not doing well or get worse. Document Released: 04/06/2004 Document Revised: 05/22/2011 Document Reviewed: 04/10/2011 [facility] Patient Information 2015 [place], [place]. This information is not intended to replace advice given to you by your health care provider. Make sure you discuss any questions you have with your health care provider.

## 2014-07-17 ENCOUNTER — Other Ambulatory Visit: Payer: Self-pay | Admitting: Internal Medicine

## 2014-07-28 NOTE — Progress Notes (Signed)
   Subjective:    Patient ID: Ronald Stafford, male    DOB: [birth year], 81 y.o.   MRN: [id number]  HPI    Review of Systems     Objective:   Physical Exam        Assessment & Plan:  Medical screening examination/treatment/procedure(s) were performed by non-physician practitioner and as supervising physician I was immediately available for consultation/collaboration. I agree with above. Unice Cobble, MD

## 2014-09-25 ENCOUNTER — Emergency Department (HOSPITAL_COMMUNITY): Payer: Medicare Other

## 2014-09-25 ENCOUNTER — Ambulatory Visit: Payer: Medicare Other | Admitting: Family

## 2014-09-25 ENCOUNTER — Inpatient Hospital Stay (HOSPITAL_COMMUNITY)
Admission: EM | Admit: 2014-09-25 | Discharge: 2014-09-27 | DRG: 195 | Disposition: A | Discharge status: Home / Self Care | Payer: Medicare Other | Attending: Internal Medicine | Admitting: Internal Medicine

## 2014-09-25 ENCOUNTER — Encounter (HOSPITAL_COMMUNITY): Payer: Self-pay | Admitting: *Deleted

## 2014-09-25 ENCOUNTER — Telehealth: Payer: Self-pay | Admitting: Internal Medicine

## 2014-09-25 DIAGNOSIS — Z86010 Personal history of colonic polyps: Secondary | ICD-10-CM

## 2014-09-25 DIAGNOSIS — Z8042 Family history of malignant neoplasm of prostate: Secondary | ICD-10-CM | POA: Diagnosis not present

## 2014-09-25 DIAGNOSIS — Z79899 Other long term (current) drug therapy: Secondary | ICD-10-CM

## 2014-09-25 DIAGNOSIS — R195 Other fecal abnormalities: Secondary | ICD-10-CM | POA: Diagnosis present

## 2014-09-25 DIAGNOSIS — Z86718 Personal history of other venous thrombosis and embolism: Secondary | ICD-10-CM

## 2014-09-25 DIAGNOSIS — R0602 Shortness of breath: Secondary | ICD-10-CM | POA: Diagnosis not present

## 2014-09-25 DIAGNOSIS — K219 Gastro-esophageal reflux disease without esophagitis: Secondary | ICD-10-CM | POA: Diagnosis present

## 2014-09-25 DIAGNOSIS — Z87891 Personal history of nicotine dependence: Secondary | ICD-10-CM

## 2014-09-25 DIAGNOSIS — M4806 Spinal stenosis, lumbar region: Secondary | ICD-10-CM | POA: Diagnosis present

## 2014-09-25 DIAGNOSIS — I10 Essential (primary) hypertension: Secondary | ICD-10-CM | POA: Diagnosis present

## 2014-09-25 DIAGNOSIS — Z8043 Family history of malignant neoplasm of testis: Secondary | ICD-10-CM

## 2014-09-25 DIAGNOSIS — N3281 Overactive bladder: Secondary | ICD-10-CM | POA: Diagnosis present

## 2014-09-25 DIAGNOSIS — Z79891 Long term (current) use of opiate analgesic: Secondary | ICD-10-CM | POA: Diagnosis not present

## 2014-09-25 DIAGNOSIS — Z8546 Personal history of malignant neoplasm of prostate: Secondary | ICD-10-CM

## 2014-09-25 DIAGNOSIS — Z91018 Allergy to other foods: Secondary | ICD-10-CM | POA: Diagnosis not present

## 2014-09-25 DIAGNOSIS — E785 Hyperlipidemia, unspecified: Secondary | ICD-10-CM | POA: Diagnosis present

## 2014-09-25 DIAGNOSIS — R079 Chest pain, unspecified: Secondary | ICD-10-CM

## 2014-09-25 DIAGNOSIS — F329 Major depressive disorder, single episode, unspecified: Secondary | ICD-10-CM | POA: Diagnosis present

## 2014-09-25 DIAGNOSIS — Z0289 Encounter for other administrative examinations: Secondary | ICD-10-CM

## 2014-09-25 DIAGNOSIS — Z9079 Acquired absence of other genital organ(s): Secondary | ICD-10-CM | POA: Diagnosis present

## 2014-09-25 DIAGNOSIS — M48061 Spinal stenosis, lumbar region without neurogenic claudication: Secondary | ICD-10-CM | POA: Diagnosis present

## 2014-09-25 DIAGNOSIS — J189 Pneumonia, unspecified organism: Principal | ICD-10-CM | POA: Diagnosis present

## 2014-09-25 DIAGNOSIS — R0600 Dyspnea, unspecified: Secondary | ICD-10-CM

## 2014-09-25 DIAGNOSIS — Z96649 Presence of unspecified artificial hip joint: Secondary | ICD-10-CM | POA: Diagnosis present

## 2014-09-25 LAB — CBC
HEMATOCRIT: 37.8 % — AB (ref 39.0–52.0)
Hemoglobin: 12.4 g/dL — ABNORMAL LOW (ref 13.0–17.0)
MCH: 30.7 pg (ref 26.0–34.0)
MCHC: 32.8 g/dL (ref 30.0–36.0)
MCV: 93.6 fL (ref 78.0–100.0)
PLATELETS: 169 10*3/uL (ref 150–400)
RBC: 4.04 MIL/uL — ABNORMAL LOW (ref 4.22–5.81)
RDW: 13.6 % (ref 11.5–15.5)
WBC: 8.9 10*3/uL (ref 4.0–10.5)

## 2014-09-25 LAB — BASIC METABOLIC PANEL
Anion gap: 5 (ref 5–15)
BUN: 18 mg/dL (ref 6–20)
CO2: 27 mmol/L (ref 22–32)
Calcium: 9.3 mg/dL (ref 8.9–10.3)
Chloride: 107 mmol/L (ref 101–111)
Creatinine, Ser: 0.83 mg/dL (ref 0.61–1.24)
GFR calc Af Amer: >60 mL/min (ref >60)
Glucose, Bld: 145 mg/dL — ABNORMAL HIGH (ref 65–99)
POTASSIUM: 3.7 mmol/L (ref 3.5–5.1)
Sodium: 139 mmol/L (ref 135–145)

## 2014-09-25 LAB — CBC WITH DIFFERENTIAL/PLATELET
Basophils Absolute: 0 10*3/uL (ref 0.0–0.1)
Basophils Relative: 0 % (ref 0–1)
EOS ABS: 0 10*3/uL (ref 0.0–0.7)
Eosinophils Relative: 0 % (ref 0–5)
HEMATOCRIT: 41 % (ref 39.0–52.0)
HEMOGLOBIN: 13.4 g/dL (ref 13.0–17.0)
LYMPHS ABS: 0.8 10*3/uL (ref 0.7–4.0)
Lymphocytes Relative: 10 % — ABNORMAL LOW (ref 12–46)
MCH: 30.7 pg (ref 26.0–34.0)
MCHC: 32.7 g/dL (ref 30.0–36.0)
MCV: 94 fL (ref 78.0–100.0)
MONO ABS: 1.3 10*3/uL — AB (ref 0.1–1.0)
MONOS PCT: 16 % — AB (ref 3–12)
Neutro Abs: 5.8 10*3/uL (ref 1.7–7.7)
Neutrophils Relative %: 74 % (ref 43–77)
Platelets: 153 10*3/uL (ref 150–400)
RBC: 4.36 MIL/uL (ref 4.22–5.81)
RDW: 13.5 % (ref 11.5–15.5)
WBC: 8 10*3/uL (ref 4.0–10.5)

## 2014-09-25 LAB — CREATININE, SERUM
CREATININE: 0.86 mg/dL (ref 0.61–1.24)
GFR calc Af Amer: >60 mL/min (ref >60)

## 2014-09-25 LAB — TROPONIN I: Troponin I: <0.03 ng/mL (ref <0.031)

## 2014-09-25 LAB — MAGNESIUM: MAGNESIUM: 2 mg/dL (ref 1.7–2.4)

## 2014-09-25 LAB — PHOSPHORUS: Phosphorus: 3.1 mg/dL (ref 2.5–4.6)

## 2014-09-25 MED ORDER — AZITHROMYCIN 250 MG PO TABS
250.0000 mg | ORAL_TABLET | Freq: Every day | ORAL | Status: DC
  Administered 2014-09-26: 250 mg via ORAL
  Filled 2014-09-25: qty 1

## 2014-09-25 MED ORDER — SODIUM CHLORIDE 0.9 % IV SOLN
INTRAVENOUS | Status: DC
  Administered 2014-09-25: 14:00:00 via INTRAVENOUS

## 2014-09-25 MED ORDER — ALBUTEROL SULFATE (2.5 MG/3ML) 0.083% IN NEBU
5.0000 mg | INHALATION_SOLUTION | Freq: Once | RESPIRATORY_TRACT | Status: AC
  Administered 2014-09-25: 5 mg via RESPIRATORY_TRACT
  Filled 2014-09-25: qty 6

## 2014-09-25 MED ORDER — MORPHINE SULFATE 2 MG/ML IJ SOLN
2.0000 mg | Freq: Once | INTRAMUSCULAR | Status: AC
  Administered 2014-09-25: 2 mg via INTRAVENOUS
  Filled 2014-09-25: qty 1

## 2014-09-25 MED ORDER — DEXTROSE 5 % IV SOLN
1.0000 g | Freq: Once | INTRAVENOUS | Status: AC
  Administered 2014-09-25: 1 g via INTRAVENOUS
  Filled 2014-09-25: qty 10

## 2014-09-25 MED ORDER — PANTOPRAZOLE SODIUM 40 MG PO TBEC
40.0000 mg | DELAYED_RELEASE_TABLET | Freq: Every day | ORAL | Status: DC
  Administered 2014-09-25 – 2014-09-26 (×2): 40 mg via ORAL
  Filled 2014-09-25 (×3): qty 1

## 2014-09-25 MED ORDER — DEXTROSE 5 % IV SOLN
500.0000 mg | Freq: Once | INTRAVENOUS | Status: AC
  Administered 2014-09-25: 500 mg via INTRAVENOUS
  Filled 2014-09-25: qty 500

## 2014-09-25 MED ORDER — ENOXAPARIN SODIUM 40 MG/0.4ML ~~LOC~~ SOLN
40.0000 mg | SUBCUTANEOUS | Status: DC
  Administered 2014-09-25: 40 mg via SUBCUTANEOUS
  Filled 2014-09-25 (×2): qty 0.4

## 2014-09-25 MED ORDER — CEFTRIAXONE SODIUM IN DEXTROSE 20 MG/ML IV SOLN
1.0000 g | INTRAVENOUS | Status: DC
  Filled 2014-09-25: qty 50

## 2014-09-25 MED ORDER — HYDROCODONE-HOMATROPINE 5-1.5 MG/5ML PO SYRP
5.0000 mL | ORAL_SOLUTION | ORAL | Status: DC | PRN
  Administered 2014-09-26 – 2014-09-27 (×4): 5 mL via ORAL
  Filled 2014-09-25 (×4): qty 5

## 2014-09-25 MED ORDER — POTASSIUM CHLORIDE IN NACL 20-0.45 MEQ/L-% IV SOLN
INTRAVENOUS | Status: DC
  Administered 2014-09-25 – 2014-09-26 (×2): via INTRAVENOUS
  Filled 2014-09-25 (×2): qty 1000

## 2014-09-25 MED ORDER — ASPIRIN EC 81 MG PO TBEC
81.0000 mg | DELAYED_RELEASE_TABLET | Freq: Every day | ORAL | Status: DC
  Administered 2014-09-25 – 2014-09-26 (×2): 81 mg via ORAL
  Filled 2014-09-25 (×3): qty 1

## 2014-09-25 MED ORDER — IOHEXOL 350 MG/ML SOLN
100.0000 mL | Freq: Once | INTRAVENOUS | Status: AC | PRN
  Administered 2014-09-25: 100 mL via INTRAVENOUS

## 2014-09-25 MED ORDER — ACETAMINOPHEN 325 MG PO TABS
650.0000 mg | ORAL_TABLET | Freq: Four times a day (QID) | ORAL | Status: DC | PRN
  Administered 2014-09-25 – 2014-09-27 (×4): 650 mg via ORAL
  Filled 2014-09-25 (×4): qty 2

## 2014-09-25 MED ORDER — ALBUTEROL SULFATE (2.5 MG/3ML) 0.083% IN NEBU: 2.5000 mg | INHALATION_SOLUTION | Freq: Four times a day (QID) | RESPIRATORY_TRACT | Status: DC | PRN

## 2014-09-25 MED ORDER — MIRABEGRON ER 50 MG PO TB24
50.0000 mg | ORAL_TABLET | Freq: Every day | ORAL | Status: DC
  Administered 2014-09-26 – 2014-09-27 (×2): 50 mg via ORAL
  Filled 2014-09-25 (×2): qty 1

## 2014-09-25 NOTE — ED Notes (Signed)
MD at bedside. Hospitalist at bedside. 

## 2014-09-25 NOTE — ED Notes (Signed)
Patient transported to CT 

## 2014-09-25 NOTE — ED Notes (Signed)
Pt contacted EMS d/t increased SOB. Pt [place] on [place] when symptom of sob first onset. Pt was seen in an urgent care yesterday,pt diagnosed with PNA. abx prescribed and cough syrup. Pt reports having some CP in combination with SOB.

## 2014-09-25 NOTE — ED Notes (Signed)
Bed: PJ79 Expected date:  Expected time:  Means of arrival:  Comments: EMS- SOB, recent PNA

## 2014-09-25 NOTE — ED Notes (Signed)
Attempted to call report to 3W. RN unable to take report at this time. Will call back. 

## 2014-09-25 NOTE — ED Provider Notes (Signed)
Pt care transitioned from Dr Reather Converse. Imaging as below. Pt on Levaquin but has only taken one dose at this point. He doesn't appear distressed, but O2 sats in high 80s on RA while at rest. Will discuss with medicine for admission. Pt/son updated with results and plan.     Dg Chest 2 View  09/25/2014   CLINICAL DATA:  Shortness of breath and left-sided chest pain ; productive cough for 3 days  EXAM: CHEST  2 VIEW  COMPARISON:  Chest radiograph Aug 05, 2012; chest CT August 14, 2012  FINDINGS: There are scattered areas of scarring in both mid and lower lung zones. There is no focal edema or airspace consolidation. The heart size and pulmonary vascularity are normal. No adenopathy. There is atherosclerotic change in the thoracic aorta. There is mild degenerative change in the thoracic spine.  IMPRESSION: Scattered areas of scarring bilaterally.  No edema or consolidation.   Electronically Signed   By: Lowella Grip III M.D.   On: 09/25/2014 14:10   Ct Angio Chest Pe W/cm &/or Wo Cm  09/25/2014   CLINICAL DATA:  Pt contacted EMS d/t increased SOB. Pt reports on [place] when symptom of sob first onset. Pt was seen in an urgent care yesterday, pt diagnosed with PNA, pt reports he is now having mid cp, h/o prostate ca 2004  EXAM: CT ANGIOGRAPHY CHEST WITH CONTRAST  TECHNIQUE: Multidetector CT imaging of the chest was performed using the standard protocol during bolus administration of intravenous contrast. Multiplanar CT image reconstructions and MIPs were obtained to evaluate the vascular anatomy.  CONTRAST:  148mL OMNIPAQUE IOHEXOL 350 MG/ML SOLN  COMPARISON:  Chest x-ray 09/25/2014  FINDINGS: Heart: Coronary artery calcification noted. No pericardial effusions.  Vascular structures: Pulmonary arteries are well opacified. There is no pulmonary embolus. There is moderate atherosclerotic calcification of the thoracic and abdominal aorta.  Mediastinum/thyroid: New small bilateral hilar  lymph nodes are present, measuring on the order of 1 cm in diameter. AP window region lymph node measures 1.6 cm in diameter. The The visualized portion of the thyroid gland has a normal appearance.  Lungs/Airways: There are numerous focal opacities about the lungs bilaterally, involving the dependent aspect of the upper and lower lobes in the as well as the lingula and right middle lobe. No pleural effusions are identified. No pneumothorax.  Upper abdomen: Gallbladder is present. There is dense atherosclerotic calcification of the abdominal aorta.  Chest wall/osseous structures: There is moderate mid thoracic spondylosis.  Review of the MIP images confirms the above findings.  IMPRESSION: 1. Technically adequate exam showing no pulmonary embolus. 2. Multifocal lung opacities, favoring infectious process. Typical and atypical infections should be considered. Given the more focal appearance of some of these opacities, I would recommend follow-up after clinical improvement to exclude malignancy. 3. Small mediastinal and hilar lymph nodes may be reactive. 4. Moderate atherosclerosis of the thoracic and abdominal aorta. 5. Coronary artery disease.   Electronically Signed   By: Nolon Nations M.D.   On: 09/25/2014 18:10    Virgel Manifold, MD 09/25/14 [phone]

## 2014-09-25 NOTE — Telephone Encounter (Signed)
Patient Name: ERYK Degan DOB: 1930/03/17 Initial Comment caller states he is dizzy, short of breath, cough and hurts in his chest Nurse Assessment Nurse: Vallery Sa, RN, [place] Date/Time (Eastern Time): 09/25/2014 11:40:27 AM Confirm and document reason for call. If symptomatic, describe symptoms. ---Caller states he has had increasing shortness of breath, dizziness and chest pain the past 3 days. He was diagnosed with Pneumonia in an urgent care facility yesterday. Has the patient traveled out of the country within the last 30 days? ---No Does the patient require triage? ---Yes Related visit to physician within the last 2 weeks? ---Yes Does the PT have any chronic conditions? (i.e. diabetes, asthma, etc.) ---Yes List chronic conditions. ---Diagnosed with Pneumonia yesterday, prostate Cancer, Back and hip surgery Guidelines Guideline Title Affirmed Question Affirmed Notes Breathing Difficulty SEVERE difficulty breathing (e.g., struggling for each breath, speaks in single words) Final Disposition User Call EMS 911 Now Vallery Sa, RN, [place] Disagree/Comply: Eugenie Norrie with 911 Operator# [phone]

## 2014-09-25 NOTE — H&P (Signed)
[facility] History and Physical  BLANDON OFFERDAHL GBM:[id number] DOB: 1930/04/01 DOA: 09/25/2014  Referring physician: Elnora Morrison, MD PCP: Unice Cobble, MD   Chief Complaint: Shortness of breath  HPI: Ronald Stafford is a 81 y.o. male  [name] is an 81 year old male with a past medical history hyperlipidemia, hypertension, prostate cancer, status post prostatectomy, postoperative DVT who comes to the ER with the complaint of shortness of breath, associated with productive cough of colored mucus and mild pleuritic chest pain on his lower left chest wall during deep inspiration.  The Patient states that he was feeling well on Monday, was at the beach and then later did not feel very well without any other specific symptoms. He subsequently got home and developed fatigue and malaise, but not sure if he had a temperature. Symptoms had an intermittent pattern of intensity, but gradually started getting worse and by Thursday he went to an urgent care facility where he was prescribed Levaquin and discharged home. However, this morning the patient did not feel better, he called the urgent care facility again to see if he could be seen, but his symptoms worsened to the point that they arranged for EMS to go pick up the patient at home and bring him to the emergency room. In the emergency room, workup with a CT angiogram of the chest reveals multifocal lung opacities consistent with infection.  When seen in the emergency room, patient was in no acute distress, but mildly tachycardic which improved after the patient had his oxygen cannula placed in his nostrils again.   Review of Systems:  Constitutional:  Positive for  night sweats, Fevers, chills, fatigue.  No weight loss.  HEENT:  Mild headache  headaches,  Denies Difficulty swallowing,Tooth/dental problems,Sore throat,  No sneezing, itching, ear ache, nasal congestion, post nasal drip,  Cardio-vascular:  No chest pain, Orthopnea,  PND, swelling in lower extremities, anasarca, dizziness, palpitations  GI:  No heartburn, indigestion, abdominal pain, nausea, vomiting, diarrhea, change in bowel habits, loss of appetite  Resp:  Positives of breath with exertion or at rest.Positive  productive cough,   No coughing up of blood.No change in color of mucus.No wheezing.No chest wall deformity  Skin:  no rash or lesions.  GU:  no dysuria, change in color of urine, no urgency or frequency. No flank pain.  Musculoskeletal:  No joint pain or swelling.  No decreased range of motion. No back pain.  Psych:  No change in mood or affect. No depression or anxiety. No memory loss.   Past Medical History  Diagnosis Date  . Depression   . GERD (gastroesophageal reflux disease)   . Hyperlipidemia   . Hypertension   . Deep venous thrombosis 1997    Postop  . Colon polyps   . Shoulder fracture, left 02/23/2011    immobilized in sling , Dr Norris(no surgery)  . Cancer 2004    prostate cancer, Dr. Risa Grill   Past Surgical History  Procedure Laterality Date  . Prostatectomy  2004    w/ radiation, Dr Risa Grill  . Joint replacement  1997, 2001    Total Hip replacement X 2  . Colonoscopy  2006    Negative,Dr.Edwards  . Meckel diverticulum excision      Age 54  . Vasectomy    . Lumbar spine surgery  09/28/2010    Dr Rolena Infante; for spinal stenosis  . Colonoscopy with polypectomy       PMH of   Social History:  reports that he quit smoking about 26 years ago. His smoking use included Cigarettes. He has a 2.5 pack-year smoking history. He does not have any smokeless tobacco history on file. He reports that he does not drink alcohol or use illicit drugs.  Allergies  Allergen Reactions  . Chicken Allergy Nausea And Vomiting    Digestive problems    Family History  Problem Relation Age of Onset  . Prostate cancer Father   . Testicular cancer Son     Prior to Admission medications   Medication Sig Start Date End Date Taking?  Authorizing Provider  acetaminophen (TYLENOL) 325 MG tablet Take 650 mg by mouth every 6 (six) hours as needed for pain. Pain   Yes Historical Provider, MD  albuterol (PROAIR HFA) 108 (90 BASE) MCG/ACT inhaler Inhale 2 puffs into the lungs every 6 (six) hours as needed. Wheezing/shortness of breath 09/24/14 09/24/15 Yes Historical Provider, MD  HYDROcodone-homatropine (HYCODAN) 5-1.5 MG/5ML syrup Take 5 mLs by mouth 4 (four) times daily as needed. cough 09/24/14 10/04/14 Yes Historical Provider, MD  ipratropium-albuterol (DUONEB) 0.5-2.5 (3) MG/3ML SOLN Inhale 3 mLs into the lungs every 6 (six) hours as needed. Wheezing and shortness of breath 09/24/14  Yes Historical Provider, MD  levofloxacin (LEVAQUIN) 750 MG tablet Take 750 mg by mouth daily. 7 days 09/24/14 10/01/14 Yes Historical Provider, MD  MYRBETRIQ 50 MG TB24 tablet Take 1 tablet by mouth daily. 09/24/14  Yes Historical Provider, MD  Omega-3 Fatty Acids (FISH OIL) 1000 MG CAPS Take by mouth 2 (two) times daily.    Yes Historical Provider, MD  omeprazole (PRILOSEC) 20 MG capsule Take 1 capsule (20 mg total) by mouth daily. 09/22/13  Yes Hendricks Limes, MD  vitamin C (ASCORBIC ACID) 250 MG tablet Take 250 mg by mouth daily.   Yes Historical Provider, MD  simvastatin (ZOCOR) 40 MG tablet TAKE 1 TABLET (40 MG TOTAL) BY MOUTH DAILY. Patient not taking: Reported on 09/25/2014 07/17/14   Hendricks Limes, MD   Physical Exam: Filed Vitals:   09/25/14 1238 09/25/14 1424 09/25/14 1630 09/25/14 1913  BP: 144/58 193/83 182/65 158/64  Pulse: 92 93 102 108  Temp: 97.7 F (36.5 C)     TempSrc: Oral     Resp: 13 11 18 16   Height: 6\' 2"  (1.88 m)     Weight: 81.647 kg (180 lb)     SpO2: 100% 95% 93%     Wt Readings from Last 3 Encounters:  09/25/14 81.647 kg (180 lb)  07/14/14 86.75 kg (191 lb 4 oz)  04/22/14 82.158 kg (181 lb 2 oz)    General:  Appears calm and comfortable Eyes: PERRL, normal lids, irises & conjunctiva ENT: grossly normal hearing,  lips & tongueMildly dry  Neck: no LAD, masses or thyromegaly Cardiovascular: RRR, no m/r/g. No LE edema. Telemetry: SR,mildly tachycardic at 111 bpm, improved with oxygen therapy.  Respiratory:Bilateral rales, mild rhonchi, no wheezing. No respiratory distress. Abdomen: soft, ntnd Skin: no rash or induration seen on limited exam,   Musculoskeletal: grossly normal tone BUE/BLE Psychiatric: grossly normal mood and affect, speech fluent and appropriate Neurologic: grossly non-focal.          Labs on Admission:  Basic Metabolic Panel:  Recent Labs Lab 09/25/14 1336  NA 139  K 3.7  CL 107  CO2 27  GLUCOSE 145  BUN 18  CREATININE 0.83  CALCIUM 9.3   Liver Function Tests: No results for input(s): AST, ALT, ALKPHOS, BILITOT, PROT, ALBUMIN in  the last 168 hours. No results for input(s): LIPASE, AMYLASE in the last 168 hours. No results for input(s): AMMONIA in the last 168 hours. CBC:  Recent Labs Lab 09/25/14 1336  WBC 8.0  NEUTROABS 5.8  HGB 13.4  HCT 41.0  MCV 94.0  PLT 153   Cardiac Enzymes:  Recent Labs Lab 09/25/14 1336 09/25/14 1700  TROPONINI <0.03 <0.03    BNP (last 3 results) No results for input(s): BNP in the last 8760 hours.  ProBNP (last 3 results) No results for input(s): PROBNP in the last 8760 hours.  CBG: No results for input(s): GLUCAP in the last 168 hours.  Radiological Exams on Admission: Dg Chest 2 View  09/25/2014   CLINICAL DATA:  Shortness of breath and left-sided chest pain ; productive cough for 3 days  EXAM: CHEST  2 VIEW  COMPARISON:  Chest radiograph Aug 05, 2012; chest CT August 14, 2012  FINDINGS: There are scattered areas of scarring in both mid and lower lung zones. There is no focal edema or airspace consolidation. The heart size and pulmonary vascularity are normal. No adenopathy. There is atherosclerotic change in the thoracic aorta. There is mild degenerative change in the thoracic spine.  IMPRESSION: Scattered areas of  scarring bilaterally.  No edema or consolidation.   Electronically Signed   By: Lowella Grip III M.D.   On: 09/25/2014 14:10   Ct Angio Chest Pe W/cm &/or Wo Cm  09/25/2014   CLINICAL DATA:  Pt contacted EMS d/t increased SOB. Pt reports on Monday being out of town at the beach when symptom of sob first onset. Pt was seen in an urgent care yesterday, pt diagnosed with PNA, pt reports he is now having mid cp, h/o prostate ca 2004  EXAM: CT ANGIOGRAPHY CHEST WITH CONTRAST  TECHNIQUE: Multidetector CT imaging of the chest was performed using the standard protocol during bolus administration of intravenous contrast. Multiplanar CT image reconstructions and MIPs were obtained to evaluate the vascular anatomy.  CONTRAST:  177mL OMNIPAQUE IOHEXOL 350 MG/ML SOLN  COMPARISON:  Chest x-ray 09/25/2014  FINDINGS: Heart: Coronary artery calcification noted. No pericardial effusions.  Vascular structures: Pulmonary arteries are well opacified. There is no pulmonary embolus. There is moderate atherosclerotic calcification of the thoracic and abdominal aorta.  Mediastinum/thyroid: New small bilateral hilar lymph nodes are present, measuring on the order of 1 cm in diameter. AP window region lymph node measures 1.6 cm in diameter. The The visualized portion of the thyroid gland has a normal appearance.  Lungs/Airways: There are numerous focal opacities about the lungs bilaterally, involving the dependent aspect of the upper and lower lobes in the as well as the lingula and right middle lobe. No pleural effusions are identified. No pneumothorax.  Upper abdomen: Gallbladder is present. There is dense atherosclerotic calcification of the abdominal aorta.  Chest wall/osseous structures: There is moderate mid thoracic spondylosis.  Review of the MIP images confirms the above findings.  IMPRESSION: 1. Technically adequate exam showing no pulmonary embolus. 2. Multifocal lung opacities, favoring infectious process. Typical and  atypical infections should be considered. Given the more focal appearance of some of these opacities, I would recommend follow-up after clinical improvement to exclude malignancy. 3. Small mediastinal and hilar lymph nodes may be reactive. 4. Moderate atherosclerosis of the thoracic and abdominal aorta. 5. Coronary artery disease.   Electronically Signed   By: Nolon Nations M.D.   On: 09/25/2014 18:10    EKG: Independently reviewed. Date/Time: Friday September 25 2014 12:40:12 EDT Ventricular Rate: 94 PR Interval: 150 QRS Duration: 80 QT Interval: 339 QTC Calculation: 424 R Axis: 53 Text Interpretation: Sinus rhythm   Assessment/Plan Principal Problem:  CAP (community acquired pneumonia) Continue IV ceftriaxone, the patient received a dose of IV azithromycin earlier, I will switch to oral in the morning.  Active Problems:   Essential hypertension Monitor blood pressure.    GERD On pantoprazole orally.    SPINAL STENOSIS, LUMBAR   Analgesics as needed.  Overactive bladder Continue Mirabegron.    Code Status: Full DVT Prophylaxis: Lovenox Family Communication: Tyler,Greg Son [phone]  Disposition Plan: Home with outpatient follow-up  Time spent: 60 minutes  Reubin Milan [facility] Pager [phone]

## 2014-09-25 NOTE — ED Provider Notes (Signed)
CSN: [id number]     Arrival date & time 09/25/14  1230 History   First MD Initiated Contact with Patient 09/25/14 1233     Chief Complaint  Patient presents with  . Shortness of Breath     (Consider location/radiation/quality/duration/timing/severity/associated sxs/prior Treatment) HPI Comments: 81 year old male with history of lipids, high blood pressure, prostate cancer, blood clot after major surgery years ago presents with worsening shortness of breath and cough productive since Monday. Patient was seen in urgent care diagnosed with pneumonia and started metabolic/night however worsening symptoms. Patient is mild pain with coughing and deep breath in the left anterior region. No cardiac history known. No recent hospitalization, no unilateral leg symptoms. Symptoms intermittent. No oxygen at home. Past smoker.  Patient is a 81 y.o. male presenting with shortness of breath. The history is provided by the patient.  Shortness of Breath Associated symptoms: chest pain and cough   Associated symptoms: no abdominal pain, no fever, no headaches, no neck pain, no rash and no vomiting     Past Medical History  Diagnosis Date  . Depression   . GERD (gastroesophageal reflux disease)   . Hyperlipidemia   . Hypertension   . Deep venous thrombosis 1997    Postop  . Colon polyps   . Shoulder fracture, left 02/23/2011    immobilized in sling , Dr Norris(no surgery)  . Cancer 2004    prostate cancer, Dr. Risa Grill   Past Surgical History  Procedure Laterality Date  . Prostatectomy  2004    w/ radiation, Dr Risa Grill  . Joint replacement  1997, 2001    Total Hip replacement X 2  . Colonoscopy  2006    Negative,Dr.Edwards  . Meckel diverticulum excision      Age 19  . Vasectomy    . Lumbar spine surgery  09/28/2010    Dr Rolena Infante; for spinal stenosis  . Colonoscopy with polypectomy       PMH of   Family History  Problem Relation Age of Onset  . Prostate cancer Father   . Testicular cancer  Son    History  Substance Use Topics  . Smoking status: Former Smoker -- 0.50 packs/day for 5 years    Types: Cigarettes    Quit date: 03/13/1988  . Smokeless tobacco: Not on file     Comment: Quit at about age 70 / chewed tobacca x 1 year  . Alcohol Use: No    Review of Systems  Constitutional: Negative for fever and chills.  HENT: Negative for congestion.   Eyes: Negative for visual disturbance.  Respiratory: Positive for cough and shortness of breath.   Cardiovascular: Positive for chest pain.  Gastrointestinal: Negative for vomiting and abdominal pain.  Genitourinary: Negative for dysuria and flank pain.  Musculoskeletal: Negative for back pain, neck pain and neck stiffness.  Skin: Negative for rash.  Neurological: Negative for light-headedness and headaches.      Allergies  Chicken allergy  Home Medications   Prior to Admission medications   Medication Sig Start Date End Date Taking? Authorizing Provider  acetaminophen (TYLENOL) 325 MG tablet Take 650 mg by mouth every 6 (six) hours as needed for pain. Pain   Yes Historical Provider, MD  albuterol (PROAIR HFA) 108 (90 BASE) MCG/ACT inhaler Inhale 2 puffs into the lungs every 6 (six) hours as needed. Wheezing/shortness of breath 09/24/14 09/24/15 Yes Historical Provider, MD  HYDROcodone-homatropine (HYCODAN) 5-1.5 MG/5ML syrup Take 5 mLs by mouth 4 (four) times daily as needed. cough 09/24/14  10/04/14 Yes Historical Provider, MD  ipratropium-albuterol (DUONEB) 0.5-2.5 (3) MG/3ML SOLN Inhale 3 mLs into the lungs every 6 (six) hours as needed. Wheezing and shortness of breath 09/24/14  Yes Historical Provider, MD  levofloxacin (LEVAQUIN) 750 MG tablet Take 750 mg by mouth daily. 7 days 09/24/14 10/01/14 Yes Historical Provider, MD  MYRBETRIQ 50 MG TB24 tablet Take 1 tablet by mouth daily. 09/24/14  Yes Historical Provider, MD  Omega-3 Fatty Acids (FISH OIL) 1000 MG CAPS Take by mouth 2 (two) times daily.    Yes Historical Provider,  MD  omeprazole (PRILOSEC) 20 MG capsule Take 1 capsule (20 mg total) by mouth daily. 09/22/13  Yes Hendricks Limes, MD  vitamin C (ASCORBIC ACID) 250 MG tablet Take 250 mg by mouth daily.   Yes Historical Provider, MD  simvastatin (ZOCOR) 40 MG tablet TAKE 1 TABLET (40 MG TOTAL) BY MOUTH DAILY. Patient not taking: Reported on 09/25/2014 07/17/14   Hendricks Limes, MD   BP 193/83 mmHg  Pulse 93  Temp(Src) 97.7 F (36.5 C) (Oral)  Resp 11  Ht 6\' 2"  (1.88 m)  Wt 180 lb (81.647 kg)  BMI 23.10 kg/m2  SpO2 95% Physical Exam  Constitutional: He is oriented to person, place, and time. He appears well-developed and well-nourished.  HENT:  Head: Normocephalic and atraumatic.  Eyes: Conjunctivae are normal. Right eye exhibits no discharge. Left eye exhibits no discharge.  Neck: Normal range of motion. Neck supple. No tracheal deviation present.  Cardiovascular: Normal rate and regular rhythm.   Pulmonary/Chest: Effort normal. He has rales (few sparse bases bilateral).  Abdominal: Soft. He exhibits no distension. There is no tenderness. There is no guarding.  Musculoskeletal: He exhibits no edema.  Neurological: He is alert and oriented to person, place, and time.  Skin: Skin is warm. No rash noted.  Psychiatric: He has a normal mood and affect.  Nursing note and vitals reviewed.   ED Course  Procedures (including critical care time) Labs Review Labs Reviewed  BASIC METABOLIC PANEL - Abnormal; Notable for the following:    Glucose, Bld 145 (*)    All other components within normal limits  CBC WITH DIFFERENTIAL/PLATELET - Abnormal; Notable for the following:    Lymphocytes Relative 10 (*)    Monocytes Relative 16 (*)    Monocytes Absolute 1.3 (*)    All other components within normal limits  TROPONIN I  TROPONIN I    Imaging Review Dg Chest 2 View  09/25/2014   CLINICAL DATA:  Shortness of breath and left-sided chest pain ; productive cough for 3 days  EXAM: CHEST  2 VIEW   COMPARISON:  Chest radiograph Aug 05, 2012; chest CT August 14, 2012  FINDINGS: There are scattered areas of scarring in both mid and lower lung zones. There is no focal edema or airspace consolidation. The heart size and pulmonary vascularity are normal. No adenopathy. There is atherosclerotic change in the thoracic aorta. There is mild degenerative change in the thoracic spine.  IMPRESSION: Scattered areas of scarring bilaterally.  No edema or consolidation.   Electronically Signed   By: Lowella Grip III M.D.   On: 09/25/2014 14:10     EKG Interpretation   Date/Time:  Friday September 25 2014 12:40:12 EDT Ventricular Rate:  94 PR Interval:  150 QRS Duration: 80 QT Interval:  339 QTC Calculation: 424 R Axis:   53 Text Interpretation:  Sinus rhythm Confirmed by Lavetta Geier  MD, Jisele Price ([id number])  on 09/25/2014 1:20:23 PM  MDM   Final diagnoses:  Dyspnea  Chest pain, unspecified chest pain type   Patient presents with worsening terms of breath and cough with mild pleuritic chest pain. Chest x-ray reviewed no obvious acute infiltrate. CT scan ordered for further delineation of likely pneumonia and to look for blood clot. Patient's care be signed up to follow-up CT scan for final disposition. Patient not requiring oxygen on reassessment. Differential diagnosis were considered with the presenting HPI.  Medications  0.9 %  sodium chloride infusion ( Intravenous New Bag/Given 09/25/14 1333)  iohexol (OMNIPAQUE) 350 MG/ML injection 100 mL (not administered)  morphine 2 MG/ML injection 2 mg (2 mg Intravenous Given 09/25/14 1322)  albuterol (PROVENTIL) (2.5 MG/3ML) 0.083% nebulizer solution 5 mg (5 mg Nebulization Given 09/25/14 1613)    Filed Vitals:   09/25/14 1237 09/25/14 1238 09/25/14 1424  BP:  144/58 193/83  Pulse:  92 93  Temp:  97.7 F (36.5 C)   TempSrc:  Oral   Resp:  13 11  Height:  6\' 2"  (1.88 m)   Weight:  180 lb (81.647 kg)   SpO2: 96% 100% 95%    Final diagnoses:  Dyspnea   Chest pain, unspecified chest pain type    Admission/ observation were discussed with the admitting physician, patient and/or family and they are comfortable with the plan.     Elnora Morrison, MD 09/25/14 [phone]

## 2014-09-26 DIAGNOSIS — I10 Essential (primary) hypertension: Secondary | ICD-10-CM

## 2014-09-26 DIAGNOSIS — K219 Gastro-esophageal reflux disease without esophagitis: Secondary | ICD-10-CM

## 2014-09-26 DIAGNOSIS — J189 Pneumonia, unspecified organism: Principal | ICD-10-CM

## 2014-09-26 LAB — COMPREHENSIVE METABOLIC PANEL
ALBUMIN: 3.3 g/dL — AB (ref 3.5–5.0)
ALK PHOS: 57 U/L (ref 38–126)
ALT: 28 U/L (ref 17–63)
ANION GAP: 9 (ref 5–15)
AST: 31 U/L (ref 15–41)
BILIRUBIN TOTAL: 0.7 mg/dL (ref 0.3–1.2)
BUN: 17 mg/dL (ref 6–20)
CHLORIDE: 105 mmol/L (ref 101–111)
CO2: 28 mmol/L (ref 22–32)
Calcium: 9.2 mg/dL (ref 8.9–10.3)
Creatinine, Ser: 0.84 mg/dL (ref 0.61–1.24)
Glucose, Bld: 158 mg/dL — ABNORMAL HIGH (ref 65–99)
POTASSIUM: 3.8 mmol/L (ref 3.5–5.1)
Sodium: 142 mmol/L (ref 135–145)
Total Protein: 6.7 g/dL (ref 6.5–8.1)

## 2014-09-26 LAB — STREP PNEUMONIAE URINARY ANTIGEN: STREP PNEUMO URINARY ANTIGEN: NEGATIVE

## 2014-09-26 LAB — CBC
HCT: 37.7 % — ABNORMAL LOW (ref 39.0–52.0)
HEMOGLOBIN: 12 g/dL — AB (ref 13.0–17.0)
MCH: 29.9 pg (ref 26.0–34.0)
MCHC: 31.8 g/dL (ref 30.0–36.0)
MCV: 94 fL (ref 78.0–100.0)
Platelets: 145 10*3/uL — ABNORMAL LOW (ref 150–400)
RBC: 4.01 MIL/uL — AB (ref 4.22–5.81)
RDW: 13.7 % (ref 11.5–15.5)
WBC: 6.6 10*3/uL (ref 4.0–10.5)

## 2014-09-26 LAB — HIV ANTIBODY (ROUTINE TESTING W REFLEX): HIV Screen 4th Generation wRfx: NONREACTIVE

## 2014-09-26 MED ORDER — LEVOFLOXACIN IN D5W 750 MG/150ML IV SOLN
750.0000 mg | INTRAVENOUS | Status: DC
  Administered 2014-09-26: 750 mg via INTRAVENOUS
  Filled 2014-09-26 (×2): qty 150

## 2014-09-26 MED ORDER — CETYLPYRIDINIUM CHLORIDE 0.05 % MT LIQD
7.0000 mL | Freq: Two times a day (BID) | OROMUCOSAL | Status: DC
  Administered 2014-09-26 – 2014-09-27 (×3): 7 mL via OROMUCOSAL

## 2014-09-26 MED ORDER — PREDNISONE 20 MG PO TABS
40.0000 mg | ORAL_TABLET | Freq: Every day | ORAL | Status: DC
  Administered 2014-09-26 – 2014-09-27 (×2): 40 mg via ORAL
  Filled 2014-09-26 (×3): qty 2

## 2014-09-26 MED ORDER — ALBUTEROL SULFATE (2.5 MG/3ML) 0.083% IN NEBU: 2.5000 mg | INHALATION_SOLUTION | RESPIRATORY_TRACT | Status: DC | PRN

## 2014-09-26 MED ORDER — LEVOFLOXACIN IN D5W 750 MG/150ML IV SOLN: 750.0000 mg | INTRAVENOUS | Status: DC

## 2014-09-26 MED ORDER — HYDRALAZINE HCL 20 MG/ML IJ SOLN: 10.0000 mg | INTRAMUSCULAR | Status: DC | PRN

## 2014-09-26 MED ORDER — IBUPROFEN 200 MG PO TABS: 400.0000 mg | ORAL_TABLET | ORAL | Status: DC | PRN

## 2014-09-26 NOTE — Progress Notes (Signed)
Pt up to take a shower. Family members visiting.

## 2014-09-26 NOTE — Progress Notes (Addendum)
[facility] PROGRESS NOTE  Ronald Stafford SWH:[id number] DOB: 04-Mar-1931 DOA: 09/25/2014 PCP: Ronald Cobble, MD  Brief Summary  [name] is an 81 year old male with a past medical history hyperlipidemia, hypertension, prostate cancer, status post prostatectomy, postoperative DVT who comes to the ER with the complaint of shortness of breath, associated with productive cough of colored mucus and pleuritic chest pain on his lower left chest wall during deep inspiration.  He also developed fever prior to admission.  He came to the ER because of severe pain and dyspnea.  In the emergency room, workup with a CT angiogram of the chest revealed multifocal lung opacities consistent with infection.  He had received 1 dose of levofloxacin prior to admission.    Assessment/Plan  CAP (community acquired pneumonia), very multifocal on CT scan -  Discontinue IV ceftriaxone and azithromycin -  Start levofloxacin again -  Start albuterol when necessary -  Start Ronald Stafford prednisone burst -  Continue cough medication -  Strep pneumo negative, Legionella pending -  Blood cultures no growth to date -  Agree with f/u imaging after treatment for pneumonia to demonstrate clearance  Active Problems:  Essential hypertension, blood pressure stable to mildly elevated, likely secondary to stress from acute illness  -  start when necessary hydralazine -   follow-up with primary care doctor in 1-2 weeks   GERD, stable, continue PPI.   SPINAL STENOSIS, LUMBAR, STABLE, CONTINUE TYLENOL AND ADD MOTRIN WHEN NECESSARY   Overactive bladder, stable, Continue Mirabegron.  Diregular Access: YKZ:LDJT: Lovenox  Code Status: Full Family Communication: Patient, son, daughter Disposition Plan:  likely home tomorrow morning if less Ronald Stafford of breath  consultants: None  Procedures:  CT angio chest:  Multifocal pneumonia  Antibiotics:  Levofloxacin prior to admission x 1 dose  Ceftriaxone and azithro x 1  dose  Resumed levofloxacin on 7/16   HPI/Subjective:   feeling much better, continues to have wheezy cough. Was able to ambulate around the halls some on room air without desaturation but did feel Ronald Stafford of breath.   Objective: Filed Vitals:   09/25/14 2329 09/26/14 0431 09/26/14 0800 09/26/14 0810  BP: 139/71 150/64    Pulse: 102 93  88  Temp: 98.6 F (37 C) 97.7 F (36.5 C)    TempSrc: Oral Oral    Resp: 18 18    Height:      Weight:      SpO2: 95% 98% 99% 98%    Intake/Output Summary (Last 24 hours) at 09/26/14 1435 Last data filed at 09/26/14 1310  Gross per 24 hour  Intake 2693.33 ml  Output    150 ml  Net 2543.33 ml   Filed Weights   09/25/14 1238  Weight: 81.647 kg (180 lb)   Body mass index is 23.1 kg/(m^2).  Exam:   General:  adult male,  No acute distress Initially but developed some accessory muscle use and tachypnea after sitting up in bed to allow me to listen to his lungs posteriorly  HEENT:  NCAT, MMM  Cardiovascular:  RRR, nl S1, S2 no mrg, 2+ pulses, warm extremities  Respiratory:  Diminished bilateral breath sounds with rales heard at the bilateral bases and mid back,  wheezy cough, no rhonchi   Abdomen:   NABS, soft, NT/ND  MSK:   Normal tone and bulk, no LEE  Neuro:  Grossly intact  Data Reviewed: Basic Metabolic Panel:  Recent Labs Lab 09/25/14 1336 09/25/14 2115 09/26/14 0420  NA 139  --  142  K 3.7  --  3.8  CL 107  --  105  CO2 27  --  28  GLUCOSE 145*  --  158*  BUN 18  --  17  CREATININE 0.83 0.86 0.84  CALCIUM 9.3  --  9.2  MG  --  2.0  --   PHOS  --  3.1  --    Liver Function Tests:  Recent Labs Lab 09/26/14 0420  AST 31  ALT 28  ALKPHOS 57  BILITOT 0.7  PROT 6.7  ALBUMIN 3.3*   No results for input(s): LIPASE, AMYLASE in the last 168 hours. No results for input(s): AMMONIA in the last 168 hours. CBC:  Recent Labs Lab 09/25/14 1336 09/25/14 2115 09/26/14 0420  WBC 8.0 8.9 6.6  NEUTROABS 5.8  --    --   HGB 13.4 12.4* 12.0*  HCT 41.0 37.8* 37.7*  MCV 94.0 93.6 94.0  PLT 153 169 145*    Recent Results (from the past 240 hour(s))  Culture, blood (routine x 2) Call MD if unable to obtain prior to antibiotics being given     Status: None (Preliminary result)   Collection Time: 09/25/14  9:15 PM  Result Value Ref Range Status   Specimen Description BLOOD RIGHT ANTECUBITAL  Final   Special Requests BOTTLES DRAWN AEROBIC AND ANAEROBIC  5CC  Final   Culture   Final    NO GROWTH < 24 HOURS Performed at [facility]    Report Status PENDING  Incomplete  Culture, blood (routine x 2) Call MD if unable to obtain prior to antibiotics being given     Status: None (Preliminary result)   Collection Time: 09/25/14  9:22 PM  Result Value Ref Range Status   Specimen Description BLOOD RIGHT HAND  Final   Special Requests BOTTLES DRAWN AEROBIC AND ANAEROBIC  10CC  Final   Culture   Final    NO GROWTH < 24 HOURS Performed at [facility]    Report Status PENDING  Incomplete     Studies: Dg Chest 2 View  09/25/2014   CLINICAL DATA:  Shortness of breath and left-sided chest pain ; productive cough for 3 days  EXAM: CHEST  2 VIEW  COMPARISON:  Chest radiograph Aug 05, 2012; chest CT August 14, 2012  FINDINGS: There are scattered areas of scarring in both mid and lower lung zones. There is no focal edema or airspace consolidation. The heart size and pulmonary vascularity are normal. No adenopathy. There is atherosclerotic change in the thoracic aorta. There is mild degenerative change in the thoracic spine.  IMPRESSION: Scattered areas of scarring bilaterally.  No edema or consolidation.   Electronically Signed   By: Ronald Stafford III M.D.   On: 09/25/2014 14:10   Ct Angio Chest Pe W/cm &/or Wo Cm  09/25/2014   CLINICAL DATA:  Pt contacted EMS d/t increased SOB. Pt reports [place] when symptom of sob first onset. Pt was seen in an urgent care yesterday,  pt diagnosed with PNA, pt reports he is now having mid cp, h/o prostate ca 2004  EXAM: CT ANGIOGRAPHY CHEST WITH CONTRAST  TECHNIQUE: Multidetector CT imaging of the chest was performed using the standard protocol during bolus administration of intravenous contrast. Multiplanar CT image reconstructions and MIPs were obtained to evaluate the vascular anatomy.  CONTRAST:  156mL OMNIPAQUE IOHEXOL 350 MG/ML SOLN  COMPARISON:  Chest x-ray 09/25/2014  FINDINGS: Heart:  Coronary artery calcification noted. No pericardial effusions.  Vascular structures: Pulmonary arteries are well opacified. There is no pulmonary embolus. There is moderate atherosclerotic calcification of the thoracic and abdominal aorta.  Mediastinum/thyroid: New small bilateral hilar lymph nodes are present, measuring on the order of 1 cm in diameter. AP window region lymph node measures 1.6 cm in diameter. The The visualized portion of the thyroid gland has a normal appearance.  Lungs/Airways: There are numerous focal opacities about the lungs bilaterally, involving the dependent aspect of the upper and lower lobes in the as well as the lingula and right middle lobe. No pleural effusions are identified. No pneumothorax.  Upper abdomen: Gallbladder is present. There is dense atherosclerotic calcification of the abdominal aorta.  Chest wall/osseous structures: There is moderate mid thoracic spondylosis.  Review of the MIP images confirms the above findings.  IMPRESSION: 1. Technically adequate exam showing no pulmonary embolus. 2. Multifocal lung opacities, favoring infectious process. Typical and atypical infections should be considered. Given the more focal appearance of some of these opacities, I would recommend follow-up after clinical improvement to exclude malignancy. 3. Small mediastinal and hilar lymph nodes may be reactive. 4. Moderate atherosclerosis of the thoracic and abdominal aorta. 5. Coronary artery disease.   Electronically Signed   By:  Nolon Nations M.D.   On: 09/25/2014 18:10    Scheduled Meds: . antiseptic oral rinse  7 mL Mouth Rinse BID  . aspirin EC  81 mg Oral Daily  . mirabegron ER  50 mg Oral Daily  . pantoprazole  40 mg Oral Daily   Continuous Infusions:   Principal Problem:   CAP (community acquired pneumonia) Active Problems:   Essential hypertension   GERD   SPINAL STENOSIS, LUMBAR    Time spent: 30 min    Micaylah Bertucci, [place]  [facility] Pager [phone]. If 7PM-7AM, please contact night-coverage at [email or web address], password [facility] [place] 09/26/2014, 2:35 PM  LOS: 1 day

## 2014-09-26 NOTE — Progress Notes (Signed)
Patient had antibiotic in ED and blood cultures were not obtained prior. Spoke to [place]den Field NP and she said to "go ahead and get them now".

## 2014-09-26 NOTE — Progress Notes (Signed)
Called and got report on patient from [place] in ED.

## 2014-09-26 NOTE — Progress Notes (Addendum)
ANTIBIOTIC CONSULT NOTE - INITIAL  Pharmacy Consult for levofloxacin Indication: pneumonia  Allergies  Allergen Reactions  . Chicken Allergy Nausea And Vomiting    Digestive problems    Patient Measurements: Height: 6\' 2"  (188 cm) Weight: 180 lb (81.647 kg) IBW/kg (Calculated) : 82.2 Adjusted Body Weight:   Vital Signs: Temp: 97.7 F (36.5 C) (07/16 0431) Temp Source: Oral (07/16 0431) BP: 150/64 mmHg (07/16 0431) Pulse Rate: 88 (07/16 0810) Intake/Output from previous day: 07/15 0701 - 07/16 0700 In: 1703.3 [P.O.:750; I.V.:953.3] Out: 150 [Urine:150] Intake/Output from this shift: Total I/O In: 990 [P.O.:360; I.V.:630] Out: -   Labs:  Recent Labs  09/25/14 1336 09/25/14 2115 09/26/14 0420  WBC 8.0 8.9 6.6  HGB 13.4 12.4* 12.0*  PLT 153 169 145*  CREATININE 0.83 0.86 0.84   Estimated Creatinine Clearance: 79.6 mL/min (by C-G formula based on Cr of 0.84). No results for input(s): VANCOTROUGH, VANCOPEAK, VANCORANDOM, GENTTROUGH, GENTPEAK, GENTRANDOM, TOBRATROUGH, TOBRAPEAK, TOBRARND, AMIKACINPEAK, AMIKACINTROU, AMIKACIN in the last 72 hours.   Microbiology: Recent Results (from the past 720 hour(s))  Culture, blood (routine x 2) Call MD if unable to obtain prior to antibiotics being given     Status: None (Preliminary result)   Collection Time: 09/25/14  9:15 PM  Result Value Ref Range Status   Specimen Description BLOOD RIGHT ANTECUBITAL  Final   Special Requests BOTTLES DRAWN AEROBIC AND ANAEROBIC  5CC  Final   Culture   Final    NO GROWTH < 24 HOURS Performed at [facility]    Report Status PENDING  Incomplete  Culture, blood (routine x 2) Call MD if unable to obtain prior to antibiotics being given     Status: None (Preliminary result)   Collection Time: 09/25/14  9:22 PM  Result Value Ref Range Status   Specimen Description BLOOD RIGHT HAND  Final   Special Requests BOTTLES DRAWN AEROBIC AND ANAEROBIC  10CC  Final   Culture   Final    NO  GROWTH < 24 HOURS Performed at [facility]    Report Status PENDING  Incomplete    Medical History: Past Medical History  Diagnosis Date  . Depression   . GERD (gastroesophageal reflux disease)   . Hyperlipidemia   . Hypertension   . Deep venous thrombosis 1997    Postop  . Colon polyps   . Shoulder fracture, left 02/23/2011    immobilized in sling , Dr Norris(no surgery)  . Cancer 2004    prostate cancer, Dr. Risa Grill    Assessment: 72 YOM presents with shortness of breath, started on antibiotics for CAP. Recently started of LVQ as outpt (7/14).  Initially placed on ceftriaxone/azithromycin but now orders for pharmacy to dose levofloxacin  7/15 >>Ceftriaxone >> 7/16 7/15 >> azith >> 7/16 - last dose 7/16 ~10am 7/16 >> levofloxacin  7/15 blood: pending Strep Ag: neg Legionella Ag: pending Sputum: not collected  Goal of Therapy:  Dose per patient specific parameters  Plan:   Start levofloxacin 750mg  IV q24h - delay start to tonight based on azithromycin dose administration  Doreene Eland, PharmD, BCPS.   Pager: [phone] 09/26/2014,2:47 PM

## 2014-09-26 NOTE — Progress Notes (Signed)
Pt ambulated hallway loop, RA sats 98-100%.  Tolerated well

## 2014-09-26 NOTE — Progress Notes (Signed)
Patient admitted to room 1327 from ED. Oriented to room and unit routines. VSS. Family at bedside.

## 2014-09-27 DIAGNOSIS — R195 Other fecal abnormalities: Secondary | ICD-10-CM

## 2014-09-27 LAB — BASIC METABOLIC PANEL
Anion gap: 9 (ref 5–15)
BUN: 12 mg/dL (ref 6–20)
CALCIUM: 9.7 mg/dL (ref 8.9–10.3)
CO2: 28 mmol/L (ref 22–32)
Chloride: 104 mmol/L (ref 101–111)
Creatinine, Ser: 0.74 mg/dL (ref 0.61–1.24)
GFR calc Af Amer: >60 mL/min (ref >60)
GLUCOSE: 133 mg/dL — AB (ref 65–99)
Potassium: 4.2 mmol/L (ref 3.5–5.1)
SODIUM: 141 mmol/L (ref 135–145)

## 2014-09-27 LAB — TYPE AND SCREEN
ABO/RH(D): A POS
Antibody Screen: NEGATIVE

## 2014-09-27 LAB — APTT: APTT: 37 s (ref 24–37)

## 2014-09-27 LAB — ABO/RH: ABO/RH(D): A POS

## 2014-09-27 LAB — CBC
HCT: 42.9 % (ref 39.0–52.0)
Hemoglobin: 13.9 g/dL (ref 13.0–17.0)
MCH: 30.6 pg (ref 26.0–34.0)
MCHC: 32.4 g/dL (ref 30.0–36.0)
MCV: 94.5 fL (ref 78.0–100.0)
Platelets: 190 10*3/uL (ref 150–400)
RBC: 4.54 MIL/uL (ref 4.22–5.81)
RDW: 13.5 % (ref 11.5–15.5)
WBC: 6.2 10*3/uL (ref 4.0–10.5)

## 2014-09-27 LAB — OCCULT BLOOD X 1 CARD TO LAB, STOOL: Fecal Occult Bld: POSITIVE — AB

## 2014-09-27 LAB — PROTIME-INR
INR: 1.05 (ref 0.00–1.49)
PROTHROMBIN TIME: 13.9 s (ref 11.6–15.2)

## 2014-09-27 LAB — CLOSTRIDIUM DIFFICILE BY PCR: Toxigenic C. Difficile by PCR: NEGATIVE

## 2014-09-27 MED ORDER — PREDNISONE 50 MG PO TABS: 50.0000 mg | ORAL_TABLET | Freq: Every day | ORAL | Status: DC

## 2014-09-27 MED ORDER — SACCHAROMYCES BOULARDII 250 MG PO CAPS: 250.0000 mg | ORAL_CAPSULE | Freq: Two times a day (BID) | ORAL | Status: DC

## 2014-09-27 MED ORDER — PANTOPRAZOLE SODIUM 40 MG PO TBEC
40.0000 mg | DELAYED_RELEASE_TABLET | Freq: Two times a day (BID) | ORAL | Status: DC
  Administered 2014-09-27: 40 mg via ORAL

## 2014-09-27 NOTE — Discharge Summary (Signed)
Physician Discharge Summary  Ronald Stafford YTK:[id number] DOB: [birth year] DOA: 09/25/2014  PCP: Unice Cobble, MD  Admit date: 09/25/2014 Discharge date: 09/27/2014  Recommendations for Outpatient Follow-up:  1. Continue levofloxacin 2. Take florastor to reduce risk of infectious diarrhea 3. F/u with gastroenterology regarding occult positive stool.  It appears he is due for a repeat colonoscopy by Dr. Oletta Lamas anyway.    Discharge Diagnoses:  Principal Problem:   CAP (community acquired pneumonia) Active Problems:   Essential hypertension   GERD   SPINAL STENOSIS, LUMBAR   Occult blood positive stool   Discharge Condition: stable, improved  Diet recommendation: healthy heart  Wt Readings from Last 3 Encounters:  09/25/14 81.647 kg (180 lb)  07/14/14 86.75 kg (191 lb 4 oz)  04/22/14 82.158 kg (181 lb 2 oz)    History of present illness:   [name] is an 81 year old male with a past medical history hyperlipidemia, hypertension, prostate cancer, status post prostatectomy, postoperative DVT who comes to the ER with the complaint of shortness of breath, associated with productive cough of colored mucus and pleuritic chest pain on his lower left chest wall during deep inspiration. He also developed fever prior to admission. He came to the ER because of severe pain and dyspnea. In the emergency room, workup with a CT angiogram of the chest revealed multifocal lung opacities consistent with infection. He had received 1 dose of levofloxacin prior to admission.   Hospital Course:   CAP (community acquired pneumonia), very multifocal on CT scan.  He was initially started on ceftriaxone and azithromycin, however, levofloxacin is an excellent antibiotic for multifocal pneumonia and he had only received one dose prior to admission and has the prescription at home already.  He was transitioned back to levofloxacin.  He continued to have severe cough with left chest pain.  He was advised  to use a heating pad and tylenol for his chest pain.  His troponin was negative and his ECG demonstrated no evidence of ischemia.  His chest pain was worse with movement and coughing, suggesting musculoskeletal pain.  He was given prescriptions for prednisone in addition to the albuterol and levofloxacin he already had at home.  Recommend repeat imaging as outpatient after completion of treatment for pneumonia to demonstrate resolution.    Active Problems: Essential hypertension, blood pressure stable to mildly elevated, likely secondary to stress from acute illness.  Follow-up with primary care doctor in 1-2 weeks for repeat BP check.  GERD, stable, continued PPI.  Spinal stenosis, stable, continue tylenol.  Initially added motrin, but stopped after he developed occult positive stools.    Overactive bladder, stable, Continued Mirabegron.  Soft frequent stools, likely side effect of antibiotics, although legionella can also cause pneumonia with diarrhea. Legionella PCR is pending.  Levofloxacin will treat legionella regardless.  Recommended starting florastor to reduce risk of C. diff.  C. Diff PCR was negative.  Occult positive stool:  May be secondary to NSAID use.  Recommended no alcohol or NSAIDS and twice daily PPI.  He should follow up with his gastroenterologist.  His hemoglobin remained stable to improved.   consultants: None  Procedures:  CT angio chest: Multifocal pneumonia  Antibiotics:  Levofloxacin prior to admission x 1 dose  Ceftriaxone and azithro x 1 dose  Resumed levofloxacin on 7/16 Discharge Exam: Filed Vitals:   09/27/14 0553  BP: 174/75  Pulse: 91  Temp: 97.7 F (36.5 C)  Resp: 18   Filed Vitals:   09/26/14 1145 09/26/14  1450 09/26/14 2134 09/27/14 0553  BP:  127/85 132/61 174/75  Pulse:  91 88 91  Temp:  98.4 F (36.9 C) 97.7 F (36.5 C) 97.7 F (36.5 C)  TempSrc:  Oral Oral Oral  Resp:  18 18 18   Height:      Weight:      SpO2: 99% 95% 95%  100%     General: adult male, No acute distress  HEENT: NCAT, MMM  Cardiovascular: RRR, nl S1, S2 no mrg, 2+ pulses, warm extremities  Respiratory: Diminished bilateral breath sounds with rales heard at the bilateral bases and mid back, wheezy cough, no rhonchi  Abdomen: NABS, soft, NT/ND  MSK: Normal tone and bulk, no LEE  Neuro: Grossly intact  Discharge Instructions      Discharge Instructions    Call MD for:  difficulty breathing, headache or visual disturbances    Complete by:  As directed      Call MD for:  extreme fatigue    Complete by:  As directed      Call MD for:  hives    Complete by:  As directed      Call MD for:  persistant dizziness or light-headedness    Complete by:  As directed      Call MD for:  persistant nausea and vomiting    Complete by:  As directed      Call MD for:  severe uncontrolled pain    Complete by:  As directed      Call MD for:  temperature >100.4    Complete by:  As directed      Diet - low sodium heart healthy    Complete by:  As directed      Discharge instructions    Complete by:  As directed   Please take prednisone for the next three days, your next dose is due tomorrow morning.  The prednisone will help with the cough, but takes about three days to start to work.  You will probably start to feel better tomorrow or Tuesday.  Continue your levofloxacin, you breathing treatments with albuterol or with your nebulizer, and use your cough medication as needed.  Do NOT use ibuprofen, aspirin, mobic, aleve, naproxen/naprosyn or any other over the counter pain relief medication except for tylenol.  Make sure you continue to take your omeprazole.  Schedule an appointment with Dr. Oletta Lamas for within one month.  You may use imodium as needed for your soft stools.     Increase activity slowly    Complete by:  As directed             Medication List    STOP taking these medications        Fish Oil 1000 MG Caps      simvastatin 40 MG tablet  Commonly known as:  ZOCOR      TAKE these medications        acetaminophen 325 MG tablet  Commonly known as:  TYLENOL  Take 650 mg by mouth every 6 (six) hours as needed for pain. Pain     HYDROcodone-homatropine 5-1.5 MG/5ML syrup  Commonly known as:  HYCODAN  Take 5 mLs by mouth 4 (four) times daily as needed. cough     ipratropium-albuterol 0.5-2.5 (3) MG/3ML Soln  Commonly known as:  DUONEB  Inhale 3 mLs into the lungs every 6 (six) hours as needed. Wheezing and shortness of breath     LEVAQUIN 750 MG tablet  Generic drug:  levofloxacin  Take 750 mg by mouth daily. 7 days     MYRBETRIQ 50 MG Tb24 tablet  Generic drug:  mirabegron ER  Take 1 tablet by mouth daily.     omeprazole 20 MG capsule  Commonly known as:  PRILOSEC  Take 1 capsule (20 mg total) by mouth daily.     predniSONE 50 MG tablet  Commonly known as:  DELTASONE  Take 1 tablet (50 mg total) by mouth daily with breakfast.     PROAIR HFA 108 (90 BASE) MCG/ACT inhaler  Generic drug:  albuterol  Inhale 2 puffs into the lungs every 6 (six) hours as needed. Wheezing/shortness of breath     saccharomyces boulardii 250 MG capsule  Commonly known as:  FLORASTOR  Take 1 capsule (250 mg total) by mouth 2 (two) times daily.     vitamin C 250 MG tablet  Commonly known as:  ASCORBIC ACID  Take 250 mg by mouth daily.       Follow-up Information    Follow up with Unice Cobble, MD. Schedule an appointment as soon as possible for a visit in 1 week.   Specialty:  Internal Medicine   Contact information:   [address] [address] [phone]       Follow up with EDWARDS JR,JAMES L, MD In 3 weeks.   Specialty:  Gastroenterology   Contact information:   [address] [place] [address] [phone]        The results of significant diagnostics from this hospitalization (including imaging, microbiology, ancillary and laboratory) are listed below for  reference.    Significant Diagnostic Studies: Dg Chest 2 View  09/25/2014   CLINICAL DATA:  Shortness of breath and left-sided chest pain ; productive cough for 3 days  EXAM: CHEST  2 VIEW  COMPARISON:  Chest radiograph Aug 05, 2012; chest CT August 14, 2012  FINDINGS: There are scattered areas of scarring in both mid and lower lung zones. There is no focal edema or airspace consolidation. The heart size and pulmonary vascularity are normal. No adenopathy. There is atherosclerotic change in the thoracic aorta. There is mild degenerative change in the thoracic spine.  IMPRESSION: Scattered areas of scarring bilaterally.  No edema or consolidation.   Electronically Signed   By: Lowella Grip III M.D.   On: 09/25/2014 14:10   Ct Angio Chest Pe W/cm &/or Wo Cm  09/25/2014   CLINICAL DATA:  Pt contacted EMS d/t increased SOB. Pt reports on [place] when symptom of sob first onset. Pt was seen in an urgent care yesterday, pt diagnosed with PNA, pt reports he is now having mid cp, h/o prostate ca 2004  EXAM: CT ANGIOGRAPHY CHEST WITH CONTRAST  TECHNIQUE: Multidetector CT imaging of the chest was performed using the standard protocol during bolus administration of intravenous contrast. Multiplanar CT image reconstructions and MIPs were obtained to evaluate the vascular anatomy.  CONTRAST:  134mL OMNIPAQUE IOHEXOL 350 MG/ML SOLN  COMPARISON:  Chest x-ray 09/25/2014  FINDINGS: Heart: Coronary artery calcification noted. No pericardial effusions.  Vascular structures: Pulmonary arteries are well opacified. There is no pulmonary embolus. There is moderate atherosclerotic calcification of the thoracic and abdominal aorta.  Mediastinum/thyroid: New small bilateral hilar lymph nodes are present, measuring on the order of 1 cm in diameter. AP window region lymph node measures 1.6 cm in diameter. The The visualized portion of the thyroid gland has a normal  appearance.  Lungs/Airways: There are  numerous focal opacities about the lungs bilaterally, involving the dependent aspect of the upper and lower lobes in the as well as the lingula and right middle lobe. No pleural effusions are identified. No pneumothorax.  Upper abdomen: Gallbladder is present. There is dense atherosclerotic calcification of the abdominal aorta.  Chest wall/osseous structures: There is moderate mid thoracic spondylosis.  Review of the MIP images confirms the above findings.  IMPRESSION: 1. Technically adequate exam showing no pulmonary embolus. 2. Multifocal lung opacities, favoring infectious process. Typical and atypical infections should be considered. Given the more focal appearance of some of these opacities, I would recommend follow-up after clinical improvement to exclude malignancy. 3. Small mediastinal and hilar lymph nodes may be reactive. 4. Moderate atherosclerosis of the thoracic and abdominal aorta. 5. Coronary artery disease.   Electronically Signed   By: Nolon Nations M.D.   On: 09/25/2014 18:10    Microbiology: Recent Results (from the past 240 hour(s))  Culture, blood (routine x 2) Call MD if unable to obtain prior to antibiotics being given     Status: None (Preliminary result)   Collection Time: 09/25/14  9:15 PM  Result Value Ref Range Status   Specimen Description BLOOD RIGHT ANTECUBITAL  Final   Special Requests BOTTLES DRAWN AEROBIC AND ANAEROBIC  5CC  Final   Culture   Final    NO GROWTH 2 DAYS Performed at [facility]    Report Status PENDING  Incomplete  Culture, blood (routine x 2) Call MD if unable to obtain prior to antibiotics being given     Status: None (Preliminary result)   Collection Time: 09/25/14  9:22 PM  Result Value Ref Range Status   Specimen Description BLOOD RIGHT HAND  Final   Special Requests BOTTLES DRAWN AEROBIC AND ANAEROBIC  10CC  Final   Culture   Final    NO GROWTH 2 DAYS Performed at [facility]    Report Status PENDING  Incomplete   Clostridium Difficile by PCR (not at [facility])     Status: None   Collection Time: 09/27/14  8:00 AM  Result Value Ref Range Status   C difficile by pcr NEGATIVE NEGATIVE Final     Labs: Basic Metabolic Panel:  Recent Labs Lab 09/25/14 1336 09/25/14 2115 09/26/14 0420 09/27/14 0903  NA 139  --  142 141  K 3.7  --  3.8 4.2  CL 107  --  105 104  CO2 27  --  28 28  GLUCOSE 145*  --  158* 133*  BUN 18  --  17 12  CREATININE 0.83 0.86 0.84 0.74  CALCIUM 9.3  --  9.2 9.7  MG  --  2.0  --   --   PHOS  --  3.1  --   --    Liver Function Tests:  Recent Labs Lab 09/26/14 0420  AST 31  ALT 28  ALKPHOS 57  BILITOT 0.7  PROT 6.7  ALBUMIN 3.3*   No results for input(s): LIPASE, AMYLASE in the last 168 hours. No results for input(s): AMMONIA in the last 168 hours. CBC:  Recent Labs Lab 09/25/14 1336 09/25/14 2115 09/26/14 0420 09/27/14 0903  WBC 8.0 8.9 6.6 6.2  NEUTROABS 5.8  --   --   --   HGB 13.4 12.4* 12.0* 13.9  HCT 41.0 37.8* 37.7* 42.9  MCV 94.0 93.6 94.0 94.5  PLT 153 169 145* 190   Cardiac Enzymes:  Recent Labs Lab 09/25/14 1336 09/25/14 1700  TROPONINI <0.03 <0.03   BNP: BNP (last 3 results) No results for input(s): BNP in the last 8760 hours.  ProBNP (last 3 results) No results for input(s): PROBNP in the last 8760 hours.  CBG: No results for input(s): GLUCAP in the last 168 hours.  Time coordinating discharge:  35 minutes  Signed:  Sabas Frett  [facility] 09/27/2014, 1:06 PM

## 2014-09-27 NOTE — Progress Notes (Signed)
Utilization review completed.  

## 2014-09-27 NOTE — Care Management Note (Signed)
Case Management Note  Patient Details  Name: Ronald Stafford MRN: [id number] Date of Birth: June 26, 1930  Subjective/Objective:         CAP           Action/Plan:  Home Expected Discharge Date:  09/27/2014               Expected Discharge Plan:  Home/Self Care  In-House Referral:     Discharge planning Services  CM Consult  Post Acute Care Choice:    Choice offered to:     DME Arranged:  Nebulizer machine DME Agency:  [place]:    [facility] Agency:     Status of Service:  Completed, signed off  Medicare Important Message Given:    Date Medicare IM Given:    Medicare IM give by:    Date Additional Medicare IM Given:    Additional Medicare Important Message give by:     If discussed at [place] of Stay Meetings, dates discussed:    Additional Comments: Pt dc to home. Chart reviewed, and pt dc home on nebs. Spoke to unit RN and pt does not have neb machine. Message sent to attending for orders. Contacted [facility] for neb machine for home. Will have machine delivered to home.  Erenest Rasher, RN 09/27/2014, 2:29 PM

## 2014-09-28 ENCOUNTER — Telehealth: Payer: Self-pay

## 2014-09-28 LAB — LEGIONELLA ANTIGEN, URINE

## 2014-09-28 NOTE — Telephone Encounter (Signed)
Pt is on TCM list. DC'ed on  09/27/14 to home. Instructions are to follow up with PCP (Dr. Linna Darner). DX for admission was dyspnea and CAP, CP unspecified.   LVM for pt to call back as soon as possible.   RE: TCM call needs to be completed prior to follow up with PCP on 10/01/14

## 2014-09-30 LAB — CULTURE, BLOOD (ROUTINE X 2)
CULTURE: NO GROWTH
Culture: NO GROWTH

## 2014-10-01 ENCOUNTER — Ambulatory Visit (INDEPENDENT_AMBULATORY_CARE_PROVIDER_SITE_OTHER)
Admission: RE | Admit: 2014-10-01 | Discharge: 2014-10-01 | Disposition: A | Discharge status: Home / Self Care | Payer: Medicare Other | Source: Ambulatory Visit | Attending: Internal Medicine | Admitting: Internal Medicine

## 2014-10-01 ENCOUNTER — Encounter: Payer: Self-pay | Admitting: Internal Medicine

## 2014-10-01 ENCOUNTER — Ambulatory Visit (INDEPENDENT_AMBULATORY_CARE_PROVIDER_SITE_OTHER): Payer: Medicare Other | Admitting: Internal Medicine

## 2014-10-01 VITALS — BP 148/84 | HR 95 | Temp 97.8°F | Resp 16 | Wt 191.0 lb

## 2014-10-01 DIAGNOSIS — K921 Melena: Secondary | ICD-10-CM

## 2014-10-01 DIAGNOSIS — J189 Pneumonia, unspecified organism: Secondary | ICD-10-CM

## 2014-10-01 DIAGNOSIS — Z8719 Personal history of other diseases of the digestive system: Secondary | ICD-10-CM | POA: Diagnosis not present

## 2014-10-01 NOTE — Patient Instructions (Addendum)
Please take a probiotic , Florastor OR Align, every day if the bowels are loose. This will replace the normal bacteria which  are necessary for formation of normal stool and processing of food.  Reflux of gastric acid may be asymptomatic as this may occur mainly during sleep.The triggers for reflux  include stress; the "aspirin family" ; alcohol; peppermint; and caffeine (coffee, tea, cola, and chocolate). The aspirin family would include aspirin and the nonsteroidal agents such as ibuprofen &  Naproxen. Tylenol would not cause reflux. If having symptoms ; food & drink should be avoided for @ least 2 hours before going to bed.    Your next office appointment will be determined based upon review of your pending labs  and  xrays  Those written interpretation of the lab results and instructions will be transmitted to you by mail.  Critical results will be called.   Followup as needed for any active or acute issue. Please report any significant change in your symptoms.

## 2014-10-01 NOTE — Progress Notes (Signed)
Pre visit review using our clinic review tool, if applicable. No additional management support is needed unless otherwise documented below in the visit note. 

## 2014-10-01 NOTE — Progress Notes (Signed)
   Subjective:    Patient ID: Ronald Stafford, male    DOB: 1930/08/10, 81 y.o.   MRN: [id number]  HPI   He was hospitalized 7/15-7/17/16 with community-acquired multifocal pneumonia. This was treated with Levaquin. The Levaquin was actually started in the urgent care the evening of 7/14. He completed it yesterday.  He was found to have positive fecal occult blood. He is due for colonoscopy but has not contacted Dr. Oletta Lamas, his GI specialist. He was not anemic during the hospitalization. On 7/17 his hemoglobin was 13.9. He did have loose stools in the hospital and was placed on a probiotic.  The cough has resolved and he has started exercising on a stationary bike.  He has been continuing a PPI for GERD. He continues to have 1 loose bowel movement per day which he describes as tarry. He has no bleeding dyscrasias.    Review of Systems Extrinsic symptoms of itchy, watery eyes, sneezing, or angioedema are denied. There is no significant cough, sputum production, wheezing,or  paroxysmal nocturnal dyspnea. Epistaxis, hemoptysis, hematuria, or rectal bleeding denied. Frontal headache, facial pain , nasal purulence, dental pain, sore throat , otic pain or otic discharge denied. No fever , chills or sweats. No unexplained weight loss, significant dyspepsia,dysphagia, or abdominal pain.  There is no abnormal bruising , bleeding, or difficulty stopping bleeding with injury.      Objective:   Physical Exam   Pertinent or positive findings include: He has upper and lower partials. There is erythema and slight deviation to the right of the nasal septum. Abdomen is protuberant. He has tenderness to palpation in the periumbilical area.  General appearance :adequately nourished; in no distress.  Eyes: No conjunctival inflammation or scleral icterus is present.  Oral exam:  Lips and gums are healthy appearing.There is no oropharyngeal erythema or exudate noted. Dental hygiene is good.  Heart:   Normal rate and regular rhythm. S1 and S2 normal without gallop, murmur, click, rub or other extra sounds    Lungs:Chest clear to auscultation; no wheezes, rhonchi,rales ,or rubs present.No increased work of breathing.   Abdomen: bowel sounds normal, soft  without masses, organomegaly or hernias noted.  No guarding or rebound.   Vascular : all pulses equal ; no bruits present.  Skin:Warm & dry.  Intact without suspicious lesions or rashes ; no tenting or jaundice   Lymphatic: No lymphadenopathy is noted about the head, neck, axilla  Neuro: Strength, tone  normal.        Assessment & Plan:  #1 community-acquired pneumonia  #2 melena with positive Hemoccult  See orders

## 2014-10-27 ENCOUNTER — Other Ambulatory Visit: Payer: Self-pay | Admitting: Emergency Medicine

## 2014-10-27 MED ORDER — OMEPRAZOLE 20 MG PO CPDR: 20.0000 mg | DELAYED_RELEASE_CAPSULE | Freq: Every day | ORAL | Status: DC

## 2014-12-07 ENCOUNTER — Encounter: Payer: Self-pay | Admitting: Internal Medicine

## 2014-12-07 ENCOUNTER — Ambulatory Visit (INDEPENDENT_AMBULATORY_CARE_PROVIDER_SITE_OTHER): Payer: Medicare Other | Admitting: Internal Medicine

## 2014-12-07 ENCOUNTER — Ambulatory Visit (INDEPENDENT_AMBULATORY_CARE_PROVIDER_SITE_OTHER)
Admission: RE | Admit: 2014-12-07 | Discharge: 2014-12-07 | Disposition: A | Discharge status: Home / Self Care | Payer: Medicare Other | Source: Ambulatory Visit | Attending: Internal Medicine | Admitting: Internal Medicine

## 2014-12-07 VITALS — BP 130/78 | HR 89 | Temp 97.9°F | Resp 16 | Ht 73.0 in | Wt 193.0 lb

## 2014-12-07 DIAGNOSIS — M940 Chondrocostal junction syndrome [Tietze]: Secondary | ICD-10-CM | POA: Diagnosis not present

## 2014-12-07 DIAGNOSIS — J189 Pneumonia, unspecified organism: Secondary | ICD-10-CM

## 2014-12-07 DIAGNOSIS — R519 Headache, unspecified: Secondary | ICD-10-CM

## 2014-12-07 DIAGNOSIS — R51 Headache: Secondary | ICD-10-CM

## 2014-12-07 DIAGNOSIS — Z23 Encounter for immunization: Secondary | ICD-10-CM

## 2014-12-07 DIAGNOSIS — M5030 Other cervical disc degeneration, unspecified cervical region: Secondary | ICD-10-CM | POA: Diagnosis not present

## 2014-12-07 DIAGNOSIS — G8929 Other chronic pain: Secondary | ICD-10-CM

## 2014-12-07 NOTE — Patient Instructions (Addendum)
  Your next office appointment will be determined based upon review of your pending xrays  Those written interpretation of the lab results and instructions will be transmitted to you by mail for your records.  Critical results will be called.   Followup as needed for any active or acute issue. Please report any significant change in your symptoms.   Use an anti-inflammatory cream such as Aspercreme or Zostrix cream twice a day to the affected area as needed. In lieu of this warm moist compresses or  hot water bottle can be used. Do not apply ice .  Consider glucosamine sulfate 1500 mg daily for joint symptoms. Take this daily  for 3 months and then leave it off for 2 months. This will rehydrate the cartilages.  Use a cervical memory foam pillow to prevent hyperextension or hyperflexion of the cervical spine.

## 2014-12-07 NOTE — Progress Notes (Signed)
Pre visit review using our clinic review tool, if applicable. No additional management support is needed unless otherwise documented below in the visit note. 

## 2014-12-07 NOTE — Progress Notes (Signed)
   Subjective:    Patient ID: Ronald Stafford, male    DOB: 20-Feb-1931, 81 y.o.   MRN: [id number]  HPI He was hospitalized 7/15-7/17 with multifocal pneumonia, community-acquired. This was treated with Levaquin. CT angiogram revealed no pulmonary emboli. Troponin & EKG revealed no acute cardiac process.  He continues to have pleuritic pain on the left  up to level V on a 10 scale. He also has exertional dyspnea.  Additionally during the hospital stay he was found to have positive Hemoccults. Dr. Oletta Lamas, his gastroenterologist did complete endoscopy and colonoscopy; both were negative. He denies any active GI bleeding at this time.  He's also had some blurry vision spells when he tries to read. He has not seen his ophthalmologist for at least a year.  He will have headaches 2 times a week starting in the R neck and radiating over R crown. They're described as throbbing and lasting up to an hour. They do respond to Tylenol.  He does have chronic, persistent itchy watery eyes which began 3-4 months ago.  Review of Systems Palpitations, tachycardia, exertional dyspnea, paroxysmal nocturnal dyspnea, claudication or edema are absent. Unexplained weight loss, abdominal pain, significant dyspepsia, dysphagia, melena, rectal bleeding, or persistently small caliber stools are denied. Fever, chills, sweats, or unexplained weight loss not present. Mental status change or memory loss denied. Diplopia or vision loss absent. Vertigo, near syncope or imbalance denied. No loss of control of bladder or bowels. No seizure stigmata. Extrinsic symptoms of sneezing or angioedema are denied. There is no significant cough, sputum production, or wheezing.      Objective:   Physical Exam  Pertinent or positive findings include: Appears younger than his stated age. Alopecia is present. Bilateral ptosis is noted. There is decreased range of motion of the cervical spine  to the right with associated pain. He has  some tenderness to palpation over the left inferior parasternal area. Cranial nerve exam is normal to include field of vision. [place]berg was unsteady. Pedal pulses are slightly decreased. He has a brace on the right ankle.  General appearance :adequately nourished; in no distress.  Eyes: No conjunctival inflammation or scleral icterus is present.  Oral exam:  Lips and gums are healthy appearing.There is no oropharyngeal erythema or exudate noted. Dental hygiene is good.  Heart:  Normal rate and regular rhythm. S1 and S2 normal without gallop, murmur, click, rub or other extra sounds    Lungs:Chest clear to auscultation; no wheezes, rhonchi,rales ,or rubs present.No increased work of breathing.   Abdomen: bowel sounds normal, soft and non-tender without masses, organomegaly or hernias noted.  No guarding or rebound.   Vascular : all pulses equal ; no bruits present.  Skin:Warm & dry.  Intact without suspicious lesions or rashes ; no tenting or jaundice   Lymphatic: No lymphadenopathy is noted about the head, neck, axilla  Neuro: Strength, tone & DTRs normal.     Assessment & Plan:  #1 community-acquired pneumonia, multifocal; clinically resolved  #2 chest wall pain secondary to coughing with #1  #3 headache secondary to #4  #4 cervical degenerative disc disease  #5 positive Hemoccults; full evaluation negative

## 2015-01-14 ENCOUNTER — Encounter: Payer: Self-pay | Admitting: Internal Medicine

## 2015-01-14 ENCOUNTER — Ambulatory Visit (INDEPENDENT_AMBULATORY_CARE_PROVIDER_SITE_OTHER): Payer: Medicare Other | Admitting: Internal Medicine

## 2015-01-14 VITALS — BP 144/68 | HR 99 | Temp 97.5°F | Ht 73.0 in | Wt 195.0 lb

## 2015-01-14 DIAGNOSIS — Z23 Encounter for immunization: Secondary | ICD-10-CM | POA: Diagnosis not present

## 2015-01-14 DIAGNOSIS — R030 Elevated blood-pressure reading, without diagnosis of hypertension: Secondary | ICD-10-CM | POA: Diagnosis not present

## 2015-01-14 DIAGNOSIS — J310 Chronic rhinitis: Secondary | ICD-10-CM

## 2015-01-14 MED ORDER — METOPROLOL TARTRATE 25 MG PO TABS: 25.0000 mg | ORAL_TABLET | Freq: Two times a day (BID) | ORAL | Status: DC

## 2015-01-14 MED ORDER — AMOXICILLIN 500 MG PO CAPS: 500.0000 mg | ORAL_CAPSULE | Freq: Three times a day (TID) | ORAL | Status: DC

## 2015-01-14 NOTE — Progress Notes (Signed)
   Subjective:    Patient ID: Ronald Stafford, male    DOB: [birth year], 81 y.o.   MRN: [id number]  HPI He he has had sore throat, nasal congestion, itchy/watery eyes, and paranasal pressure for one week. He's been taking over-the-counter sore throat medication with partial response. He also describes a frontal headache which radiates over the crown to the occiput. It can be as severe as a 6 on a 10 scale. This resolves with Tylenol. The headache has been present intermittently 1.5 weeks.  Intermittently he has blurred vision for a few minutes at a time. This typically occurs when he is reading. He also had some dizziness last week.  Blood pressure has been as high as 145/100 at home.  He also describes fatigue for 2 weeks.   Review of Systems  He denies otic pain or otic discharge. He has no cough, fever, chills, or sweats.    Objective:   Physical Exam   Pertinent or positive findings include: There is erythema of the right nasal septum. He has wax in the right otic canal. Breath sounds are somewhat decreased. DIP arthritic changes are present.  General appearance :adequately nourished; in no distress.  Eyes: No conjunctival inflammation or scleral icterus is present.  Oral exam:  Lips and gums are healthy appearing.There is no oropharyngeal erythema or exudate noted. Dental hygiene is good.  Heart:  Normal rate and regular rhythm. S1 and S2 normal without gallop, murmur, click, rub or other extra sounds    Lungs:Chest clear to auscultation; no wheezes, rhonchi,rales ,or rubs present.No increased work of breathing.   Abdomen: bowel sounds normal, soft and non-tender without masses, organomegaly or hernias noted.  No guarding or rebound. No flank tenderness to percussion.  Vascular : all pulses equal ; no bruits present.  Skin:Warm & dry.  Intact without suspicious lesions or rashes ; no tenting or jaundice   Lymphatic: No lymphadenopathy is noted about the head, neck, axilla, or  inguinal areas.   Neuro: Strength, tone & DTRs normal.      Assessment & Plan:  #1 non allergic rhinitis #2 elevated BP  See Orders & AVS

## 2015-01-14 NOTE — Progress Notes (Signed)
Pre visit review using our clinic review tool, if applicable. No additional management support is needed unless otherwise documented below in the visit note. 

## 2015-01-14 NOTE — Patient Instructions (Addendum)
Minimal Blood Pressure Goal= AVERAGE < 140/90;  Ideal is an AVERAGE < 135/85. This AVERAGE should be calculated from @ least 5-7 BP readings taken @ different times of day on different days of week. You should not respond to isolated BP readings , but rather the AVERAGE for that week .Please bring your  blood pressure cuff to office visits to verify that it is reliable.It  can also be checked against the blood pressure device at the pharmacy. Finger or wrist cuffs are not dependable; an arm cuff is. Fill the  prescription for the BP medication if BP NOT @ goal based on  7 to 14 day average.  Plain Mucinex (NOT D) for thick secretions ;force NON dairy fluids .   Nasal cleansing in the shower as discussed with lather of mild shampoo.After 10 seconds wash off lather while  exhaling through nostrils. Make sure that all residual soap is removed to prevent irritation.  Flonase OR Nasacort AQ 1 spray in each nostril twice a day as needed. Use the "crossover" technique into opposite nostril spraying toward opposite ear @ 45 degree angle, not straight up into nostril.  Plain Allegra (NOT D )  160 daily , Loratidine 10 mg , OR Zyrtec 10 mg @ bedtime  as needed for itchy eyes & sneezing. Fill the  prescription for antibiotic if fever, discolored nasal or chest secretions or significant pain above & below eyes appear in the next 48-72 hours.

## 2015-01-28 ENCOUNTER — Other Ambulatory Visit: Payer: Self-pay | Admitting: Internal Medicine

## 2015-02-11 ENCOUNTER — Encounter: Payer: Self-pay | Admitting: Internal Medicine

## 2015-02-11 ENCOUNTER — Ambulatory Visit (INDEPENDENT_AMBULATORY_CARE_PROVIDER_SITE_OTHER): Payer: Medicare Other | Admitting: Internal Medicine

## 2015-02-11 VITALS — BP 138/90 | HR 93 | Temp 98.4°F | Resp 20 | Ht 74.0 in | Wt 195.0 lb

## 2015-02-11 DIAGNOSIS — K219 Gastro-esophageal reflux disease without esophagitis: Secondary | ICD-10-CM

## 2015-02-11 DIAGNOSIS — R7303 Prediabetes: Secondary | ICD-10-CM | POA: Insufficient documentation

## 2015-02-11 DIAGNOSIS — G44039 Episodic paroxysmal hemicrania, not intractable: Secondary | ICD-10-CM | POA: Insufficient documentation

## 2015-02-11 DIAGNOSIS — I10 Essential (primary) hypertension: Secondary | ICD-10-CM | POA: Diagnosis not present

## 2015-02-11 DIAGNOSIS — H538 Other visual disturbances: Secondary | ICD-10-CM | POA: Diagnosis not present

## 2015-02-11 DIAGNOSIS — R42 Dizziness and giddiness: Secondary | ICD-10-CM | POA: Diagnosis not present

## 2015-02-11 MED ORDER — ASPIRIN EC 81 MG PO TBEC: 81.0000 mg | DELAYED_RELEASE_TABLET | Freq: Every day | ORAL | Status: DC

## 2015-02-11 NOTE — Progress Notes (Signed)
Pre visit review using our clinic review tool, if applicable. No additional management support is needed unless otherwise documented below in the visit note. 

## 2015-02-11 NOTE — Progress Notes (Signed)
Subjective:    Patient ID: Ronald Stafford, male    DOB: 08-02-1930, 81 y.o.   MRN: [id number]  HPI He is here to establish with a new pcp.    2 nights ago he felt like he had a film over his hands and had dizziness. He has experienced pain in the posterior right neck that radiated up his head. His symptoms were intermittent at night and we will go up yesterday morning he felt fine. He did begin to feel dizzy again and had the head pain and film over his isolate lumpectomy. Rubbing his eyes seem to help slightly with the film. He has not had an eye exam for the past year and plans on seeing his ophthalmologist. He states today he has not had any symptoms. He has had intermittent headaches and gets them approximately once a month. He describes them associated with neck stiffness and pain in the posterior neck that radiates up the head. Tylenol typically helps. He also has intermittent dizziness. Sometimes when he showers he has to lean against the wall until it passes.  He has also experienced the sensation of a film over his eyes intermittently. This does cause some blurry vision. He denies any numbness, tingling or weakness in his arms or legs. He denies any difficulty speaking, swallowing or confusion.  Hyperlipidemia: He is taking his medication daily. He is compliant with a low fat/cholesterol diet. He is exercising regularly. He denies myalgias.   GERD:  He is taking his medication daily as prescribed.  He denies any GERD symptoms and feels his GERD is well controlled.   Prediabetes: Sugars in the past have been elevated. He is not compliant with a low sugar diet. He is doing some exercise on a regular basis.   Medications and allergies reviewed with patient and updated if appropriate.  Patient Active Problem List   Diagnosis Date Noted  . Occult blood positive stool 09/27/2014  . CAP (community acquired pneumonia) 09/25/2014  . Peroneal tendonitis of right lower extremity 01/12/2014  .  Mass of right ankle 12/31/2013  . Right ankle instability 12/31/2013  . Radial head fracture, closed 09/18/2013  . Lateral epicondylitis of right elbow 09/16/2013  . Fasting hyperglycemia 05/12/2013  . Solitary pulmonary nodule 08/08/2012  . FATIGUE 05/27/2010  . Hypoxemia 05/23/2010  . SPINAL STENOSIS, LUMBAR 04/06/2010  . LOW BACK PAIN SYNDROME 11/02/2009  . FOOT DROP, RIGHT 05/21/2008  . Other nonthrombocytopenic purpuras 10/21/2007  . HYPERLIPIDEMIA 12/25/2006  . DEPRESSION 12/25/2006  . Essential hypertension 12/25/2006  . GERD 12/25/2006  . PROSTATE CANCER, HX OF 12/25/2006  . POLYP, COLON 05/02/2006    Current Outpatient Prescriptions on File Prior to Visit  Medication Sig Dispense Refill  . acetaminophen (TYLENOL) 325 MG tablet Take 650 mg by mouth every 6 (six) hours as needed for pain. Pain    . albuterol (PROAIR HFA) 108 (90 BASE) MCG/ACT inhaler Inhale 2 puffs into the lungs every 6 (six) hours as needed. Wheezing/shortness of breath    . ipratropium-albuterol (DUONEB) 0.5-2.5 (3) MG/3ML SOLN Inhale 3 mLs into the lungs every 6 (six) hours as needed. Wheezing and shortness of breath    . MYRBETRIQ 50 MG TB24 tablet Take 1 tablet by mouth daily.  10  . omeprazole (PRILOSEC) 20 MG capsule Take 1 capsule (20 mg total) by mouth daily. 90 capsule 3  . simvastatin (ZOCOR) 40 MG tablet TAKE 1 TABLET BY MOUTH EVERY DAY 90 tablet 2  . vitamin C (  ASCORBIC ACID) 250 MG tablet Take 250 mg by mouth daily.     No current facility-administered medications on file prior to visit.    Past Medical History  Diagnosis Date  . Depression   . GERD (gastroesophageal reflux disease)   . Hyperlipidemia   . Hypertension   . Deep venous thrombosis ([place]) 1997    Postop  . Colon polyps   . Shoulder fracture, left 02/23/2011    immobilized in sling , Dr Norris(no surgery)  . Cancer [facility]) 2004    prostate cancer, Dr. Risa Grill    Past Surgical History  Procedure Laterality Date  .  Prostatectomy  2004    w/ radiation, Dr Risa Grill  . Joint replacement  1997, 2001    Total Hip replacement X 2  . Colonoscopy  2006    Negative,Dr.Edwards  . Meckel diverticulum excision      Age 41  . Vasectomy    . Lumbar spine surgery  09/28/2010    Dr Rolena Infante; for spinal stenosis  . Colonoscopy with polypectomy       PMH of    Social History   Social History  . Marital Status: Widowed    Spouse Name: N/A  . Number of Children: N/A  . Years of Education: N/A   Social History Main Topics  . Smoking status: Former Smoker -- 0.50 packs/day for 5 years    Types: Cigarettes    Quit date: 03/13/1988  . Smokeless tobacco: None     Comment: Quit at about age 21 / chewed tobacca x 1 year  . Alcohol Use: No  . Drug Use: No  . Sexual Activity: Not Asked   Other Topics Concern  . None   Social History Narrative   Lives alone; Wife passed 12/26/12;   Died of ovarian cancer; breast cancer; other cancer;          Review of Systems  Constitutional: Negative for fever, chills and fatigue.  HENT: Negative for trouble swallowing.   Eyes: Positive for visual disturbance (intermittent blurry vision).  Respiratory: Negative for cough, shortness of breath and wheezing.   Cardiovascular: Negative for chest pain, palpitations and leg swelling.  Neurological: Positive for dizziness and headaches (once a month - right posterior neck and radiates up head - sharp pain). Negative for speech difficulty, weakness and numbness.       Objective:   Filed Vitals:   02/11/15 0911  BP: 138/90  Pulse: 93  Temp: 98.4 F (36.9 C)  Resp: 20   Filed Weights   02/11/15 0911  Weight: 195 lb (88.451 kg)   Body mass index is 25.03 kg/(m^2).   Physical Exam Constitutional: Appears well-developed and well-nourished. No distress.  Neck: Neck supple. No tracheal deviation present. No thyromegaly present.  No carotid bruit. No cervical adenopathy.   Cardiovascular: Normal rate, regular  rhythm and normal heart sounds.   No murmur heard. Pulmonary/Chest: Effort normal and breath sounds normal. No respiratory distress. No wheezes.  Musculoskeletal: No edema.       Assessment & Plan:   See problem list for assessment and plan  Follow-up in 6 months, sooner if needed

## 2015-02-11 NOTE — Assessment & Plan Note (Signed)
Sometimes associated with blurry vision and dizziness - sometimes they come independently Possible pinched nerve with nerve pain Concern for possible TIA like symptoms  Will check MRA of brain and neck

## 2015-02-11 NOTE — Patient Instructions (Addendum)
  We have reviewed your prior records including labs and tests today.  Test(s) ordered today. Your results will be released to [facility] (or called to you) after review, usually within 72hours after test completion. If any changes need to be made, you will be notified at that same time.  All other Health Maintenance issues reviewed.   All recommended immunizations and age-appropriate screenings are up-to-date.  No immunizations administered today.   Medications reviewed and updated.  Changes include starting a baby aspirin daily.     An MRI of your brain was ordered - you will hear from our office to schedule it.   Please schedule followup in 6 months, sooner if needed.

## 2015-02-11 NOTE — Assessment & Plan Note (Signed)
Will check blood work at next visit Stressed low sugar diet Continue regular exercise

## 2015-02-11 NOTE — Assessment & Plan Note (Signed)
Controlled Continue current daily medication

## 2015-02-11 NOTE — Assessment & Plan Note (Addendum)
BP Readings from Last 3 Encounters:  02/11/15 138/90  01/14/15 144/68  12/07/14 130/78    Overall, fairly stable No medication needed at this time, continue to monitor

## 2015-03-12 ENCOUNTER — Ambulatory Visit
Admission: RE | Admit: 2015-03-12 | Discharge: 2015-03-12 | Disposition: A | Discharge status: Home / Self Care | Payer: Medicare Other | Source: Ambulatory Visit | Attending: Internal Medicine | Admitting: Internal Medicine

## 2015-03-12 DIAGNOSIS — H538 Other visual disturbances: Secondary | ICD-10-CM

## 2015-03-12 DIAGNOSIS — R42 Dizziness and giddiness: Secondary | ICD-10-CM

## 2015-03-12 DIAGNOSIS — G44039 Episodic paroxysmal hemicrania, not intractable: Secondary | ICD-10-CM

## 2015-03-12 MED ORDER — GADOBENATE DIMEGLUMINE 529 MG/ML IV SOLN
15.0000 mL | Freq: Once | INTRAVENOUS | Status: AC | PRN
  Administered 2015-03-12: 15 mL via INTRAVENOUS

## 2015-03-18 ENCOUNTER — Other Ambulatory Visit: Payer: Self-pay | Admitting: Internal Medicine

## 2015-03-18 DIAGNOSIS — R42 Dizziness and giddiness: Secondary | ICD-10-CM

## 2015-03-18 DIAGNOSIS — H538 Other visual disturbances: Secondary | ICD-10-CM

## 2015-03-18 DIAGNOSIS — R51 Headache: Secondary | ICD-10-CM

## 2015-03-18 DIAGNOSIS — R519 Headache, unspecified: Secondary | ICD-10-CM

## 2015-04-14 ENCOUNTER — Encounter: Payer: Self-pay | Admitting: Neurology

## 2015-04-14 ENCOUNTER — Ambulatory Visit (INDEPENDENT_AMBULATORY_CARE_PROVIDER_SITE_OTHER): Payer: Medicare Other | Admitting: Neurology

## 2015-04-14 ENCOUNTER — Other Ambulatory Visit (INDEPENDENT_AMBULATORY_CARE_PROVIDER_SITE_OTHER): Payer: Medicare Other

## 2015-04-14 VITALS — BP 142/88 | HR 96 | Ht 74.0 in | Wt 194.5 lb

## 2015-04-14 DIAGNOSIS — R42 Dizziness and giddiness: Secondary | ICD-10-CM

## 2015-04-14 DIAGNOSIS — R292 Abnormal reflex: Secondary | ICD-10-CM | POA: Diagnosis not present

## 2015-04-14 DIAGNOSIS — R296 Repeated falls: Secondary | ICD-10-CM

## 2015-04-14 DIAGNOSIS — M542 Cervicalgia: Secondary | ICD-10-CM | POA: Diagnosis not present

## 2015-04-14 DIAGNOSIS — I10 Essential (primary) hypertension: Secondary | ICD-10-CM

## 2015-04-14 DIAGNOSIS — G44229 Chronic tension-type headache, not intractable: Secondary | ICD-10-CM

## 2015-04-14 LAB — SEDIMENTATION RATE: SED RATE: 8 mm/h (ref 0–22)

## 2015-04-14 MED ORDER — GABAPENTIN 100 MG PO CAPS: ORAL_CAPSULE | ORAL | Status: DC

## 2015-04-14 NOTE — Progress Notes (Signed)
NEUROLOGY CONSULTATION NOTE  DUWAYNE BOHLAND MRN: [id number] DOB: Mar 29, 1930  Referring provider: Dr. Quay Burow Primary care provider: Dr. Quay Burow  Reason for consult:  Headache, dizziness  HISTORY OF PRESENT ILLNESS: Ronald Stafford is an 82 year old right-handed male with hypertension, hyperlipidemia, GERD, lumbar spinal stenosis and history of prostate cancer who presents for headache and dizziness.  History obtained by patient and PCP note.  Imaging of head and neck MRA reviewed.  For about 1 and 1/2 years, he has been experiencing daily headache.  They are squeezing pain in a band-like distribution.  They are usually 5-6/10 but may be higher.  They usually last 2 hours and occur 2 to 3 times a day.  He notes some blurred vision.  Rarely, he has noted nausea.  He denies photophobia or phonophobia.  Singing in choir makes it worse.  He has been taking Tylenol daily.  In addition, the headache is sometimes accompanied by dizziness, which he describes as feeling "light in the head" rather than spinning sensation.  For about 2 years, he has had frequent falls.  He has right ankle instability, for which he wears a brace.  However, he feels the falls are due to inability to keep balance.  He does not some neck pain and hears his neck crunch when he turns his head.  He reports remote history of headaches in the 1970s.  MRA of head and neck performed on 03/12/15 showed small segment of signal dropout in the distal left V1 segment, correlating with artifact, but no major branch vessel stenosis or occlusion.  PAST MEDICAL HISTORY: Past Medical History  Diagnosis Date  . Depression   . GERD (gastroesophageal reflux disease)   . Hyperlipidemia   . Hypertension   . Deep venous thrombosis ([place]) 1997    Postop  . Colon polyps   . Shoulder fracture, left 02/23/2011    immobilized in sling , Dr Norris(no surgery)  . Cancer [facility]) 2004    prostate cancer, Dr. Risa Grill    PAST SURGICAL HISTORY: Past Surgical  History  Procedure Laterality Date  . Prostatectomy  2004    w/ radiation, Dr Risa Grill  . Joint replacement  1997, 2001    Total Hip replacement X 2  . Colonoscopy  2006    Negative,Dr.Edwards  . Meckel diverticulum excision      Age 47  . Vasectomy    . Lumbar spine surgery  09/28/2010    Dr Rolena Infante; for spinal stenosis  . Colonoscopy with polypectomy       PMH of    MEDICATIONS: Current Outpatient Prescriptions on File Prior to Visit  Medication Sig Dispense Refill  . acetaminophen (TYLENOL) 325 MG tablet Take 650 mg by mouth every 6 (six) hours as needed for pain. Pain    . aspirin EC 81 MG tablet Take 1 tablet (81 mg total) by mouth daily.    [name] MYRBETRIQ 50 MG TB24 tablet Take 1 tablet by mouth daily.  10  . omeprazole (PRILOSEC) 20 MG capsule Take 1 capsule (20 mg total) by mouth daily. 90 capsule 3  . simvastatin (ZOCOR) 40 MG tablet TAKE 1 TABLET BY MOUTH EVERY DAY 90 tablet 2  . vitamin C (ASCORBIC ACID) 250 MG tablet Take 250 mg by mouth daily.     No current facility-administered medications on file prior to visit.    ALLERGIES: Allergies  Allergen Reactions  . Chicken Allergy Nausea And Vomiting    Digestive problems    FAMILY  HISTORY: Family History  Problem Relation Age of Onset  . Prostate cancer Father   . Testicular cancer Son     SOCIAL HISTORY: Social History   Social History  . Marital Status: Widowed    Spouse Name: N/A  . Number of Children: N/A  . Years of Education: N/A   Occupational History  . Not on file.   Social History Main Topics  . Smoking status: Former Smoker -- 0.50 packs/day for 5 years    Types: Cigarettes    Quit date: 03/13/1988  . Smokeless tobacco: Not on file     Comment: Quit at about age 48 / chewed tobacca x 1 year  . Alcohol Use: No  . Drug Use: No  . Sexual Activity: Not on file   Other Topics Concern  . Not on file   Social History Narrative   Lives alone; Wife passed Dec 24, 2012;   Died of ovarian  cancer; breast cancer; other cancer;          REVIEW OF SYSTEMS: Constitutional: No fevers, chills, or sweats, no generalized fatigue, change in appetite Eyes: No visual changes, double vision, eye pain Ear, nose and throat: No hearing loss, ear pain, nasal congestion, sore throat Cardiovascular: No chest pain, palpitations Respiratory:  No shortness of breath at rest or with exertion, wheezes GastrointestinaI: No nausea, vomiting, diarrhea, abdominal pain, fecal incontinence Genitourinary:  No dysuria, urinary retention or frequency Musculoskeletal:  No neck pain, back pain Integumentary: No rash, pruritus, skin lesions Neurological: as above Psychiatric: No depression, insomnia, anxiety Endocrine: No palpitations, fatigue, diaphoresis, mood swings, change in appetite, change in weight, increased thirst Hematologic/Lymphatic:  No anemia, purpura, petechiae. Allergic/Immunologic: no itchy/runny eyes, nasal congestion, recent allergic reactions, rashes  PHYSICAL EXAM: Filed Vitals:   04/14/15 1457  BP: 142/88  Pulse: 96   General: No acute distress.  Patient appears well-groomed.  Head:  Normocephalic/atraumatic Eyes:  fundi unremarkable, without vessel changes, exudates, hemorrhages or papilledema. Neck: supple, no paraspinal tenderness, full range of motion Back: No paraspinal tenderness Heart: regular rate and rhythm Lungs: Clear to auscultation bilaterally. Vascular: No carotid bruits. Neurological Exam: Mental status: alert and oriented to person, place, and time, recent and remote memory intact, fund of knowledge intact, attention and concentration intact, speech fluent and not dysarthric, language intact. Cranial nerves: CN I: not tested CN II: pupils equal, round and reactive to light, visual fields intact, fundi unremarkable, without vessel changes, exudates, hemorrhages or papilledema. CN III, IV, VI:  full range of motion, no nystagmus, no ptosis CN V: facial  sensation intact CN VII: upper and lower face symmetric CN VIII: hearing intact CN IX, X: gag intact, uvula midline CN XI: sternocleidomastoid and trapezius muscles intact CN XII: tongue midline Bulk & Tone: normal, no fasciculations. Motor:  5/5 throughout  Sensation:   Reduced pinprick sensation in first two toes of left foot.  Reduced vibration sensation in both feet. Deep Tendon Reflexes:  3+ in left upper extremity and right patellar, otherwise 2+.  Questionable Babinski bilaterally Finger to nose testing:  Without dysmetria. Heel to shin:  Without dysmetria.  Gait:  Mild right limp.  Able to turn, unable to tandem walk.  Romberg negative  IMPRESSION: Diagnosis is unclear.  May have tension-type headaches that are now complicated by medication overuse.   Blurred vision Dizziness With neck pain, frequent falls, and hyperreflexia, consider cervical stenosis    HTN  PLAN: 1.  Will check MRI of brain and cervical spine  2.  Will check sed rate. 3.  To address chronic headache, will start gabapentin 100mg , titrating to 200mg  twice daily. 4.  Advised to stop Tylenol.  Instead, may take NSAID, limited to no more than 2 days out of the week 5.  Follow up after testing. 6.  BP mildly elevated.  F/u with PCP  Thank you for allowing me to take part in the care of this patient.  Metta Clines, DO  CC: Billey Gosling, MD

## 2015-04-14 NOTE — Patient Instructions (Addendum)
1.  For headache, start gabapentin 100mg  capsules.  Take 1 capsule at bedtime x7 days, then 1 capsule twice daily for 7 days, then 2 capsules twice daily 2.  Stop Tylenol.  Instead, try Advil, but limit use to no more than 2 days out of the week 3.  Will check MRI of brain and cervical spine 4.  Follow up in 3 to 4 months 5.  Check sed rate

## 2015-04-15 ENCOUNTER — Telehealth: Payer: Self-pay

## 2015-04-15 NOTE — Telephone Encounter (Signed)
Left message on machine for pt to return call to the office.  

## 2015-04-15 NOTE — Telephone Encounter (Signed)
-----   Message from Pieter Partridge, DO sent at 04/15/2015  7:19 AM EST ----- Lab is normal

## 2015-04-16 ENCOUNTER — Telehealth: Payer: Self-pay

## 2015-04-16 NOTE — Telephone Encounter (Signed)
Received fax from [employer] asking for more information. Called and gave information. Case was transferred to a MD for further review. Peer to Peer may need to be completed, as pt is scheduled for 04/22/15, and determination w/o peer review deadline is not until 04/24/15. Number for peer review [phone]  Member ID: [id number] [facility].

## 2015-04-22 ENCOUNTER — Ambulatory Visit
Admission: RE | Admit: 2015-04-22 | Discharge: 2015-04-22 | Disposition: A | Discharge status: Home / Self Care | Payer: Medicare Other | Source: Ambulatory Visit | Attending: Neurology | Admitting: Neurology

## 2015-04-22 DIAGNOSIS — R42 Dizziness and giddiness: Secondary | ICD-10-CM

## 2015-04-22 DIAGNOSIS — R296 Repeated falls: Secondary | ICD-10-CM

## 2015-04-22 DIAGNOSIS — R292 Abnormal reflex: Secondary | ICD-10-CM

## 2015-04-22 DIAGNOSIS — G44229 Chronic tension-type headache, not intractable: Secondary | ICD-10-CM

## 2015-04-22 DIAGNOSIS — M542 Cervicalgia: Secondary | ICD-10-CM

## 2015-04-23 ENCOUNTER — Telehealth: Payer: Self-pay

## 2015-04-23 DIAGNOSIS — R937 Abnormal findings on diagnostic imaging of other parts of musculoskeletal system: Secondary | ICD-10-CM

## 2015-04-23 NOTE — Telephone Encounter (Signed)
-----   Message from Pieter Partridge, DO sent at 04/23/2015  7:30 AM EST ----- MRI of brain looks okay.  MRI of cervical spine shows a portion of the canal that is touching the spinal cord.  It is not severe and usually I would not pursue further evaluation, however since he has been having falls and his reflexes are brisk, I would like him evaluated by neurosurgery just to get their opinion.

## 2015-04-23 NOTE — Telephone Encounter (Signed)
Pt scheduled for 04/26/15 @ 10 am W/ Dr Christella Noa @ [facility]

## 2015-04-23 NOTE — Telephone Encounter (Signed)
Message relayed to patient. Verbalized understanding and denied questions. Pt is aware of referral. Referral was faxed to [facility].

## 2015-06-22 ENCOUNTER — Encounter: Payer: Self-pay | Admitting: Internal Medicine

## 2015-06-22 ENCOUNTER — Other Ambulatory Visit (INDEPENDENT_AMBULATORY_CARE_PROVIDER_SITE_OTHER): Payer: Medicare Other

## 2015-06-22 ENCOUNTER — Ambulatory Visit (INDEPENDENT_AMBULATORY_CARE_PROVIDER_SITE_OTHER): Payer: Medicare Other | Admitting: Internal Medicine

## 2015-06-22 VITALS — BP 160/72 | HR 90 | Temp 98.5°F | Resp 18 | Wt 192.0 lb

## 2015-06-22 DIAGNOSIS — E785 Hyperlipidemia, unspecified: Secondary | ICD-10-CM | POA: Diagnosis not present

## 2015-06-22 DIAGNOSIS — R51 Headache: Secondary | ICD-10-CM

## 2015-06-22 DIAGNOSIS — K219 Gastro-esophageal reflux disease without esophagitis: Secondary | ICD-10-CM | POA: Diagnosis not present

## 2015-06-22 DIAGNOSIS — R519 Headache, unspecified: Secondary | ICD-10-CM | POA: Insufficient documentation

## 2015-06-22 DIAGNOSIS — I10 Essential (primary) hypertension: Secondary | ICD-10-CM

## 2015-06-22 DIAGNOSIS — R7303 Prediabetes: Secondary | ICD-10-CM

## 2015-06-22 LAB — CBC WITH DIFFERENTIAL/PLATELET
BASOS PCT: 0.9 % (ref 0.0–3.0)
Basophils Absolute: 0.1 10*3/uL (ref 0.0–0.1)
EOS PCT: 0.7 % (ref 0.0–5.0)
Eosinophils Absolute: 0 10*3/uL (ref 0.0–0.7)
HEMATOCRIT: 44.1 % (ref 39.0–52.0)
HEMOGLOBIN: 15.1 g/dL (ref 13.0–17.0)
Lymphocytes Relative: 22.1 % (ref 12.0–46.0)
Lymphs Abs: 1.3 10*3/uL (ref 0.7–4.0)
MCHC: 34.2 g/dL (ref 30.0–36.0)
MCV: 90.6 fl (ref 78.0–100.0)
MONO ABS: 0.7 10*3/uL (ref 0.1–1.0)
MONOS PCT: 12.2 % — AB (ref 3.0–12.0)
Neutro Abs: 3.7 10*3/uL (ref 1.4–7.7)
Neutrophils Relative %: 64.1 % (ref 43.0–77.0)
Platelets: 131 10*3/uL — ABNORMAL LOW (ref 150.0–400.0)
RBC: 4.87 Mil/uL (ref 4.22–5.81)
RDW: 14.2 % (ref 11.5–15.5)
WBC: 5.8 10*3/uL (ref 4.0–10.5)

## 2015-06-22 LAB — COMPREHENSIVE METABOLIC PANEL
ALBUMIN: 4.7 g/dL (ref 3.5–5.2)
ALK PHOS: 51 U/L (ref 39–117)
ALT: 22 U/L (ref 0–53)
AST: 30 U/L (ref 0–37)
BUN: 16 mg/dL (ref 6–23)
CALCIUM: 10 mg/dL (ref 8.4–10.5)
CO2: 30 mEq/L (ref 19–32)
Chloride: 106 mEq/L (ref 96–112)
Creatinine, Ser: 0.99 mg/dL (ref 0.40–1.50)
GFR: 76.81 mL/min (ref >60.00)
Glucose, Bld: 82 mg/dL (ref 70–99)
POTASSIUM: 4.1 meq/L (ref 3.5–5.1)
Sodium: 143 mEq/L (ref 135–145)
TOTAL PROTEIN: 7.3 g/dL (ref 6.0–8.3)
Total Bilirubin: 0.9 mg/dL (ref 0.2–1.2)

## 2015-06-22 LAB — LIPID PANEL
CHOL/HDL RATIO: 4
CHOLESTEROL: 193 mg/dL (ref 0–200)
HDL: 43.9 mg/dL (ref >39.00)
LDL Cholesterol: 125 mg/dL — ABNORMAL HIGH (ref 0–99)
NonHDL: 149.09
TRIGLYCERIDES: 120 mg/dL (ref 0.0–149.0)
VLDL: 24 mg/dL (ref 0.0–40.0)

## 2015-06-22 LAB — TSH: TSH: 2.18 u[IU]/mL (ref 0.35–4.50)

## 2015-06-22 LAB — HEMOGLOBIN A1C: HEMOGLOBIN A1C: 5.8 % (ref 4.6–6.5)

## 2015-06-22 MED ORDER — OMEPRAZOLE 20 MG PO CPDR: 20.0000 mg | DELAYED_RELEASE_CAPSULE | Freq: Every day | ORAL | Status: DC

## 2015-06-22 NOTE — Assessment & Plan Note (Signed)
Check lipid panel - not fasting for 12 hrs but will check anyway today On simvastatin 40 mg daily

## 2015-06-22 NOTE — Progress Notes (Signed)
Subjective:    Patient ID: Ronald Stafford, male    DOB: 1930/12/17, 82 y.o.   MRN: [id number]  HPI He is here for follow up.  Hypertension: He is taking his medication daily. He is compliant with a low sodium diet.  He denies chest pain, palpitations, edema, shortness of breath and lightheadedness. He is very active.  He does monitor his blood pressure at home, 137-145/?.  He states it is high sometimes when he comes in.   Prediabetes:  He is not careful with what he eats. He is very active.    Hyperlipidemia: He is taking his medication daily. He is not always compliant with a low fat/cholesterol diet. He is very active and does some walking. He denies myalgias.   GERD:  He is taking his medication daily as prescribed.  He denies any GERD symptoms and feels his GERD is well controlled.   Headaches:  He is taking the gabapentin and it is helping.  It does make him a little sleepy.  He still has headaches on occasion, but it resolves with rest.   Medications and allergies reviewed with patient and updated if appropriate.  Patient Active Problem List   Diagnosis Date Noted  . Prediabetes 02/11/2015  . Episodic paroxysmal hemicrania, not intractable 02/11/2015  . Occult blood positive stool 09/27/2014  . CAP (community acquired pneumonia) 09/25/2014  . Peroneal tendonitis of right lower extremity 01/12/2014  . Mass of right ankle 12/31/2013  . Right ankle instability 12/31/2013  . Radial head fracture, closed 09/18/2013  . Lateral epicondylitis of right elbow 09/16/2013  . Fasting hyperglycemia 05/12/2013  . Solitary pulmonary nodule 08/08/2012  . FATIGUE 05/27/2010  . Hypoxemia 05/23/2010  . SPINAL STENOSIS, LUMBAR 04/06/2010  . LOW BACK PAIN SYNDROME 11/02/2009  . FOOT DROP, RIGHT 05/21/2008  . Other nonthrombocytopenic purpuras 10/21/2007  . Hyperlipidemia 12/25/2006  . DEPRESSION 12/25/2006  . Essential hypertension 12/25/2006  . GERD 12/25/2006  . PROSTATE CANCER, HX OF  12/25/2006  . POLYP, COLON 05/02/2006    Current Outpatient Prescriptions on File Prior to Visit  Medication Sig Dispense Refill  . acetaminophen (TYLENOL) 325 MG tablet Take 650 mg by mouth every 6 (six) hours as needed for pain. Pain    . aspirin EC 81 MG tablet Take 1 tablet (81 mg total) by mouth daily.    [name] gabapentin (NEURONTIN) 100 MG capsule Take 1cap at bedtime x7 days, then 1 cap twice daily x7 days, then 1 cap three times daily 90 capsule 0  . Omega-3 Fatty Acids (FISH OIL) 1000 MG CAPS Take by mouth.    [name] omeprazole (PRILOSEC) 20 MG capsule Take 1 capsule (20 mg total) by mouth daily. 90 capsule 3  . simvastatin (ZOCOR) 40 MG tablet TAKE 1 TABLET BY MOUTH EVERY DAY 90 tablet 2  . vitamin C (ASCORBIC ACID) 250 MG tablet Take 250 mg by mouth daily.     No current facility-administered medications on file prior to visit.    Past Medical History  Diagnosis Date  . Depression   . GERD (gastroesophageal reflux disease)   . Hyperlipidemia   . Hypertension   . Deep venous thrombosis ([place]) 1997    Postop  . Colon polyps   . Shoulder fracture, left 02/23/2011    immobilized in sling , Dr Norris(no surgery)  . Cancer [facility]) 2004    prostate cancer, Dr. Risa Grill    Past Surgical History  Procedure Laterality Date  . Prostatectomy  2004  w/ radiation, Dr Risa Grill  . Joint replacement  1997, 2001    Total Hip replacement X 2  . Colonoscopy  2006    Negative,Dr.Edwards  . Meckel diverticulum excision      Age 82  . Vasectomy    . Lumbar spine surgery  09/28/2010    Dr Rolena Infante; for spinal stenosis  . Colonoscopy with polypectomy       PMH of    Social History   Social History  . Marital Status: Widowed    Spouse Name: N/A  . Number of Children: N/A  . Years of Education: N/A   Social History Main Topics  . Smoking status: Former Smoker -- 0.50 packs/day for 5 years    Types: Cigarettes    Quit date: 03/13/1988  . Smokeless tobacco: Not on file     Comment: Quit  at about age 24 / chewed tobacca x 1 year  . Alcohol Use: No  . Drug Use: No  . Sexual Activity: Not on file   Other Topics Concern  . Not on file   Social History Narrative   Lives alone; Wife passed Dec 25, 2012;   Died of ovarian cancer; breast cancer; other cancer;          Family History  Problem Relation Age of Onset  . Prostate cancer Father   . Testicular cancer Son     Review of Systems  Constitutional: Negative for fever.  Respiratory: Positive for cough (dry, more when outside) and shortness of breath (chronic, unchanged). Negative for wheezing.   Cardiovascular: Negative for chest pain, palpitations and leg swelling.  Gastrointestinal: Negative for abdominal pain.       Gerd controlled  Neurological: Positive for headaches (occasional). Negative for light-headedness.       Objective:   Filed Vitals:   06/22/15 1417  BP: 160/72  Pulse: 90  Temp: 98.5 F (36.9 C)  Resp: 18   Filed Weights   06/22/15 1417  Weight: 192 lb (87.091 kg)   Body mass index is 24.64 kg/(m^2).   Physical Exam Constitutional: Appears well-developed and well-nourished. No distress.  Neck: Neck supple. No tracheal deviation present. No thyromegaly present.  No carotid bruit. No cervical adenopathy.   Cardiovascular: Normal rate, regular rhythm and normal heart sounds.   No murmur heard.  No edema Pulmonary/Chest: Effort normal and breath sounds normal. No respiratory distress. No wheezes.       Assessment & Plan:   See Problem List for Assessment and Plan of chronic medical problems.   F.u in 6 months

## 2015-06-22 NOTE — Assessment & Plan Note (Signed)
GERD controlled Continue daily omeprazole 20 mg daily

## 2015-06-22 NOTE — Assessment & Plan Note (Signed)
Not compliant with a low sugar/carb diet Very active Check a1c

## 2015-06-22 NOTE — Progress Notes (Signed)
Pre visit review using our clinic review tool, if applicable. No additional management support is needed unless otherwise documented below in the visit note. 

## 2015-06-22 NOTE — Patient Instructions (Addendum)

## 2015-06-22 NOTE — Assessment & Plan Note (Signed)
Saw neuro 2017 Improved with gabapentin but it does make him a little sleepy Continue gabapentin

## 2015-06-22 NOTE — Assessment & Plan Note (Addendum)
BP elevated here today but seems to be controlled at home Recheck today still elevated Continue to monitor at home Low sodium diet Keep active Check cmp

## 2015-07-14 ENCOUNTER — Telehealth: Payer: Self-pay

## 2015-07-14 NOTE — Telephone Encounter (Signed)
Call to the patient for AWV; Agreed to come in and schedule 05/08 at 10am Also had tic bite 05/02 and is red and itching today; to come in and see NP tomorrow at 10:30am Also stated arm was sore and will discuss that as well

## 2015-07-15 ENCOUNTER — Ambulatory Visit (INDEPENDENT_AMBULATORY_CARE_PROVIDER_SITE_OTHER): Payer: Medicare Other | Admitting: Family

## 2015-07-15 ENCOUNTER — Encounter: Payer: Self-pay | Admitting: Family

## 2015-07-15 VITALS — BP 160/88 | HR 81 | Temp 97.7°F | Ht 74.0 in | Wt 193.0 lb

## 2015-07-15 DIAGNOSIS — IMO0001 Reserved for inherently not codable concepts without codable children: Secondary | ICD-10-CM

## 2015-07-15 DIAGNOSIS — W57XXXA Bitten or stung by nonvenomous insect and other nonvenomous arthropods, initial encounter: Secondary | ICD-10-CM

## 2015-07-15 DIAGNOSIS — R030 Elevated blood-pressure reading, without diagnosis of hypertension: Secondary | ICD-10-CM | POA: Diagnosis not present

## 2015-07-15 DIAGNOSIS — S30861A Insect bite (nonvenomous) of abdominal wall, initial encounter: Secondary | ICD-10-CM

## 2015-07-15 DIAGNOSIS — G5602 Carpal tunnel syndrome, left upper limb: Secondary | ICD-10-CM

## 2015-07-15 MED ORDER — DOXYCYCLINE HYCLATE 100 MG PO TABS: ORAL_TABLET | ORAL | Status: DC

## 2015-07-15 NOTE — Patient Instructions (Signed)
Splint for carpal tunnel.  One dose of antibiotic.   If there is no improvement in your symptoms, or if there is any worsening of symptoms, or if you have any additional concerns, please return for re-evaluation; or, if we are closed, consider going to the Emergency Room for evaluation if symptoms urgent.  Tick Bite Information Ticks are insects that attach themselves to the skin and draw blood for food. There are various types of ticks. Common types include wood ticks and deer ticks. Most ticks live in shrubs and grassy areas. Ticks can climb onto your body when you make contact with leaves or grass where the tick is waiting. The most common places on the body for ticks to attach themselves are the scalp, neck, armpits, waist, and groin. Most tick bites are harmless, but sometimes ticks carry germs that cause diseases. These germs can be spread to a person during the tick's feeding process. The chance of a disease spreading through a tick bite depends on:   The type of tick.  Time of year.   How long the tick is attached.   Geographic location.  HOW CAN YOU PREVENT TICK BITES? Take these steps to help prevent tick bites when you are outdoors:  Wear protective clothing. Long sleeves and long pants are best.   Wear white clothes so you can see ticks more easily.  Tuck your pant legs into your socks.   If walking on a trail, stay in the middle of the trail to avoid brushing against bushes.  Avoid walking through areas with long grass.  Put insect repellent on all exposed skin and along boot tops, pant legs, and sleeve cuffs.   Check clothing, hair, and skin repeatedly and before going inside.   Brush off any ticks that are not attached.  Take a shower or bath as soon as possible after being outdoors.  WHAT IS THE PROPER WAY TO REMOVE A TICK? Ticks should be removed as soon as possible to help prevent diseases caused by tick bites. 1. If latex gloves are available, put  them on before trying to remove a tick.  2. Using fine-point tweezers, grasp the tick as close to the skin as possible. You may also use curved forceps or a tick removal tool. Grasp the tick as close to its head as possible. Avoid grasping the tick on its body. 3. Pull gently with steady upward pressure until the tick lets go. Do not twist the tick or jerk it suddenly. This may break off the tick's head or mouth parts. 4. Do not squeeze or crush the tick's body. This could force disease-carrying fluids from the tick into your body.  5. After the tick is removed, wash the bite area and your hands with soap and water or other disinfectant such as alcohol. 6. Apply a small amount of antiseptic cream or ointment to the bite site.  7. Wash and disinfect any instruments that were used.  Do not try to remove a tick by applying a hot match, petroleum jelly, or fingernail polish to the tick. These methods do not work and may increase the chances of disease being spread from the tick bite.  WHEN SHOULD YOU SEEK MEDICAL CARE? Contact your health care provider if you are unable to remove a tick from your skin or if a part of the tick breaks off and is stuck in the skin.  After a tick bite, you need to be aware of signs and symptoms that could be  related to diseases spread by ticks. Contact your health care provider if you develop any of the following in the days or weeks after the tick bite:  Unexplained fever.  Rash. A circular rash that appears days or weeks after the tick bite may indicate the possibility of Lyme disease. The rash may resemble a target with a bull's-eye and may occur at a different part of your body than the tick bite.  Redness and swelling in the area of the tick bite.   Tender, swollen lymph glands.   Diarrhea.   Weight loss.   Cough.   Fatigue.   Muscle, joint, or bone pain.   Abdominal pain.   Headache.   Lethargy or a change in your level of  consciousness.  Difficulty walking or moving your legs.   Numbness in the legs.   Paralysis.  Shortness of breath.   Confusion.   Repeated vomiting.    This information is not intended to replace advice given to you by your health care provider. Make sure you discuss any questions you have with your health care provider.   Document Released: 02/25/2000 Document Revised: 03/20/2014 Document Reviewed: 08/07/2012 Elsevier Interactive Patient Education 2016 [place] Syndrome Carpal tunnel syndrome is a condition that causes pain in your hand and arm. The carpal tunnel is a narrow area located on the palm side of your wrist. Repeated wrist motion or certain diseases may cause swelling within the tunnel. This swelling pinches the main nerve in the wrist (median nerve). CAUSES  This condition may be caused by:   Repeated wrist motions.  Wrist injuries.  Arthritis.  A cyst or tumor in the carpal tunnel.  Fluid buildup during pregnancy. Sometimes the cause of this condition is not known.  RISK FACTORS This condition is more likely to develop in:   People who have jobs that cause them to repeatedly move their wrists in the same motion, such as butchers and cashiers.  Women.  People with certain conditions, such as:  Diabetes.  Obesity.  An underactive thyroid (hypothyroidism).  Kidney failure. SYMPTOMS  Symptoms of this condition include:  8. A tingling feeling in your fingers, especially in your thumb, index, and middle fingers. 9. Tingling or numbness in your hand. 10. An aching feeling in your entire arm, especially when your wrist and elbow are bent for long periods of time. 11. Wrist pain that goes up your arm to your shoulder. 12. Pain that goes down into your palm or fingers. 13. A weak feeling in your hands. You may have trouble grabbing and holding items. Your symptoms may feel worse during the night.  DIAGNOSIS  This condition is  diagnosed with a medical history and physical exam. You may also have tests, including:   An electromyogram (EMG). This test measures electrical signals sent by your nerves into the muscles.  X-rays. TREATMENT  Treatment for this condition includes:  Lifestyle changes. It is important to stop doing or modify the activity that caused your condition.  Physical or occupational therapy.  Medicines for pain and inflammation. This may include medicine that is injected into your wrist.  A wrist splint.  Surgery. HOME CARE INSTRUCTIONS  If You Have a Splint:  Wear it as told by your health care provider. Remove it only as told by your health care provider.  Loosen the splint if your fingers become numb and tingle, or if they turn cold and blue.  Keep the splint clean and dry.  General Instructions  Take over-the-counter and prescription medicines only as told by your health care provider.  Rest your wrist from any activity that may be causing your pain. If your condition is work related, talk to your employer about changes that can be made, such as getting a wrist pad to use while typing.  If directed, apply ice to the painful area:  Put ice in a plastic bag.  Place a towel between your skin and the bag.  Leave the ice on for 20 minutes, 2-3 times per day.  Keep all follow-up visits as told by your health care provider. This is important.  Do any exercises as told by your health care provider, physical therapist, or occupational therapist. [place] IF:   You have new symptoms.  Your pain is not controlled with medicines.  Your symptoms get worse.   This information is not intended to replace advice given to you by your health care provider. Make sure you discuss any questions you have with your health care provider.   Document Released: 02/25/2000 Document Revised: 11/18/2014 Document Reviewed: 07/15/2014 Elsevier Interactive Patient Education [employer].

## 2015-07-15 NOTE — Progress Notes (Signed)
Subjective:    Patient ID: Ronald Stafford, male    DOB: 1930/11/27, 82 y.o.   MRN: [id number]   Ronald Stafford is a 82 y.o. male who presents today for an acute visit.    HPI Comments: Patient here for evaluation of tick bit , noticed 3 days ago in right groin. Had been working in [place] while putting fence in. Reddened. itcthy. No drainage.Hydrocortisone.    Left underneath of left writs has been burning, off and on, getting worse on left wrist. ' Feels like I have been burned with cigarrette'. 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 Hurts more at nighttime. No numbness, tingling in left hand.     Past Medical History  Diagnosis Date  . Depression   . GERD (gastroesophageal reflux disease)   . Hyperlipidemia   . Hypertension   . Deep venous thrombosis ([place]) 1997    Postop  . Colon polyps   . Shoulder fracture, left 02/23/2011    immobilized in sling , Dr Stafford(no surgery)  . Cancer [facility]) 2004    prostate cancer, Ronald Stafford   Allergies: Chicken allergy Current Outpatient Prescriptions on File Prior to Visit  Medication Sig Dispense Refill  . acetaminophen (TYLENOL) 325 MG tablet Take 650 mg by mouth every 6 (six) hours as needed for pain. Pain    . aspirin EC 81 MG tablet Take 1 tablet (81 mg total) by mouth daily.    [name] gabapentin (NEURONTIN) 100 MG capsule Take 1cap at bedtime x7 days, then 1 cap twice daily x7 days, then 1 cap three times daily 90 capsule 0  . Omega-3 Fatty Acids (FISH OIL) 1000 MG CAPS Take by mouth.    [name] omeprazole (PRILOSEC) 20 MG capsule Take 1 capsule (20 mg total) by mouth daily. 90 capsule 3  . simvastatin (ZOCOR) 40 MG tablet TAKE 1 TABLET BY MOUTH EVERY DAY 90 tablet 2  . vitamin C (ASCORBIC ACID) 250 MG tablet Take 250 mg by mouth daily.     No current facility-administered medications on file prior to visit.    Social History  Substance Use Topics  . Smoking status: Former Smoker -- 0.50 packs/day for 5 years    Types: Cigarettes    Quit  date: 03/13/1988  . Smokeless tobacco: None     Comment: Quit at about age 63 / chewed tobacca x 1 year  . Alcohol Use: No    Review of Systems  Constitutional: Negative for fever and chills.  Musculoskeletal: Negative for arthralgias.  Skin: Positive for rash and wound.      Objective:    BP 162/82 mmHg  Pulse 81  Temp(Src) 97.7 F (36.5 C) (Oral)  Ht 6\' 2"  (1.88 m)  Wt 193 lb (87.544 kg)  BMI 24.77 kg/m2  SpO2 97%   Physical Exam  Constitutional: He appears well-developed and well-nourished.  Cardiovascular: Regular rhythm and normal heart sounds.   Palpable radial pulses. Skin warm.  Pulmonary/Chest: Effort normal and breath sounds normal. No respiratory distress. He has no wheezes. He has no rhonchi. He has no rales.  Musculoskeletal:       Left wrist: He exhibits normal range of motion, no tenderness, no swelling, no deformity and no laceration.  + Phalens left wrist.   Lymphadenopathy:       Head (left side): No submandibular and no preauricular adenopathy present.  Neurological: He is alert.  Skin: Skin is warm and dry.     Right erythematous  papule approximately 2 cm in diameter noted right groin. No discharge. No streaking. Skin intact. Unable to appreciate any remains tick. No bull's-eye.  Psychiatric: He has a normal mood and affect. His speech is normal and behavior is normal.  Vitals reviewed.      Assessment & Plan:   1. Tick bite of groin, initial encounter As patient was unsure how long tick was attached, we decided to cover with 1 dose of antibiotics.   - doxycycline (VIBRA-TABS) 100 MG tablet; Take two tablets by mouth ( total of 200 mg) once for lyme prophylaxis.  Dispense: 2 tablet; Refill: 0  2. Carpal tunnel syndrome of left wrist as a Phalen's. Suspect carpal tunnel left wrist. Patient agreed on conservative treatment with splint at this time.  3. Elevated blood pressure Blood pressure improved slightly from first reading in office. Patient  complying a blood pressure medications. Has appointment in our clinic on Monday which blood pressure will be rechecked. Advised to keep a log at home.  I am having [name] start on doxycycline. I am also having him maintain his acetaminophen, vitamin C, simvastatin, aspirin EC, Fish Oil, gabapentin, and omeprazole.   Meds ordered this encounter  Medications  . doxycycline (VIBRA-TABS) 100 MG tablet    Sig: Take two tablets by mouth ( total of 200 mg) once for lyme prophylaxis.    Dispense:  2 tablet    Refill:  0    Order Specific Question:  Supervising Provider    Answer:  Ronald Stafford [[id number]]     Start medications as prescribed and explained to patient on After Visit Summary ( AVS). Risks, benefits, and alternatives of the medications and treatment plan prescribed today were discussed, and patient expressed understanding.   Education regarding symptom management and diagnosis given to patient.   Follow-up:Plan follow-up as discussed or as needed if any worsening symptoms or change in condition. No Follow-up on file.   Continue to follow with Ronald Rail, MD for routine health maintenance.   Ronald Stafford and I agreed with plan.   Ronald Paris, FNP

## 2015-07-15 NOTE — Progress Notes (Signed)
Pre visit review using our clinic review tool, if applicable. No additional management support is needed unless otherwise documented below in the visit note. 

## 2015-07-19 ENCOUNTER — Ambulatory Visit (INDEPENDENT_AMBULATORY_CARE_PROVIDER_SITE_OTHER): Payer: Medicare Other

## 2015-07-19 VITALS — BP 148/70 | Ht 74.0 in | Wt 192.5 lb

## 2015-07-19 DIAGNOSIS — Z0000 Encounter for general adult medical examination without abnormal findings: Secondary | ICD-10-CM

## 2015-07-19 NOTE — Progress Notes (Addendum)
Subjective:   Ronald Stafford is a 82 y.o. male who presents for Medicare Annual/Subsequent preventive examination.  Review of Systems:   HRA assessment completed during visit; [place], [place]   The Patient was informed that this wellness visit is to identify risk and educate on how to reduce risk for increase disease through lifestyle changes.   ROS deferred to CPE exam with physician  Family and medical hx given below;  Has 2 boys; go out every Monday to eat; Strong family relationships  Last spouse x 97 yo in Sept    Lifestyle review:  HTN and hyperlipidemia / bP elevated 160/ 88 5/4 144/76 5/5 180/104 5/7; 182/ 83 and 146/77 5/8 ac breakfast was 140 79; and after sausage  biscuit; 155/ 80 Educated regarding low sodium diet/ willing to change diet;  To see Dr. Quay Burow soon for up   ISSUES: right foot getting worse and will refer to Dr. Quay Burow Prefers to go to an orthopedic;  Also left wrist; c/o of burning sensation of inner wrist; no loss of sensation Pain in wrist radiates to palm;  Will make apt with Dr. Quay Burow for fup   Cho 193; Trig 120; HDL 43; LDL 125 / A1c 5.8 Hx of prostate cancer; resolved; took prostate out Tried various meds for lingering Ur incontinence but now has adapted UA follows annual  Tobacco: former smoker/ quit in 1990; limited smoking over 5 year period ETOH NO  Medication review/ Adherent   Current dietary; friend cooks for him at times; eats healthy Meatloaf; stewed potatoes and corn; biscuits; green beans Goes out often; shrimp and fish; baby back ribs;  [place];  Fruit: does eat fruit; banana, apples and fruit in jars Every Wed; has lunch at church; eats vegetables there;  Would be willing to cut back on sodium but "loves salt" Understands that sodium is most likely making BP higher/ may have to review for BP med; will continue to check and monitor diet; Drinking plenty of water.   Current Exercise; stays busy; works; remodels Recommended  regular exercise Goal last year was to start walking every day/ was doing this until winter;  Working around the house; trying to get his energy back Commitment this year; cut TV back and get out more Barriers:  Pulled ligaments in right ankle; wears brace all the time; wants to get a 2nd opinion with ortho  Has to use a cane when outside on unlevel ground as a "pebble" can make him fall.  Also c/o of pain in left wrist and will fup with Dr. 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; people around Emergency Plan for illness; good family support Driving: no accidents Sun protection: yes Falls: no falls but at risk due to right foot; uses a cane outside on unlevel surfaces  Carries cell phone if he should need emergency help/ has one  button to push for help  Current Care Team reviewed and updated  Stressors (1-5) no stress  Depression: Denies feeling depressed or hopeless; voices pleasure in daily life Mood stable; none Had to put dog down recently but may get another   Cognitive; Presents with no issues; Engaged in assessment Manages checkbook, medications; no failures of task Ad8 score reviewed for issues; no issues   Mobilization and Functional losses from last year to this year? Right ankle is worse  Sleep pattern changes; no   Urinary or fecal incontinence reviewed/ urinary issues;  See urologist annual; Dr. Elmore Guise;  Care  providers in snapshot  Counseling Health Maintenance Colonoscopy; 10/2014; aged out EKG: 09/2014  Hearing: 2000hz  both ear: given resource for hearing aid as well as to get info on adaptive equipment for phone as he admits to difficulty hearing on phone.   Ophthalmology exam; due 2018; no issues   2000hz ; both ears  Medicare pays for hearing exam   Immunizations Due: TD due/ Will take today but not given as Practice does not have TD vaccines; on back order. Will fup with Dr. Quay Burow on visit   Advanced Directive; YES; is now dividing land for sons.   Health Recommendations and Referrals Request referral as appropriate for loss of function in right ankle as well as evaluate issues with right wrist; Nerve impingement of other  Current Care Team reviewed and updated  Cardiac Risk Factors include: advanced age (>70men, >4 women);male gender;sedentary lifestyle     Objective:    Vitals: BP 148/70 mmHg  Ht 6\' 2"  (1.88 m)  Wt 192 lb 8 oz (87.317 kg)  BMI 24.70 kg/m2  Body mass index is 24.7 kg/(m^2).  Tobacco History  Smoking status  . Former Smoker -- 0.50 packs/day for 5 years  . Types: Cigarettes  . Quit date: 03/13/1988  Smokeless tobacco  . Not on file    Comment: Quit at about age 83 / chewed tobacca x 1 year     Counseling given: Not Answered   Past Medical History  Diagnosis Date  . Depression   . GERD (gastroesophageal reflux disease)   . Hyperlipidemia   . Hypertension   . Deep venous thrombosis ([place]) 1997    Postop  . Colon polyps   . Shoulder fracture, left 02/23/2011    immobilized in sling , Dr Norris(no surgery)  . Cancer [facility]) 2004    prostate cancer, Dr. Risa Grill   Past Surgical History  Procedure Laterality Date  . Prostatectomy  2004    w/ radiation, Dr Risa Grill  . Joint replacement  1997, 2001    Total Hip replacement X 2  . Colonoscopy  2006    Negative,Dr.Edwards  . Meckel diverticulum excision      Age 17  . Vasectomy    . Lumbar spine  surgery  09/28/2010    Dr Rolena Infante; for spinal stenosis  . Colonoscopy with polypectomy       PMH of   Family History  Problem Relation Age of Onset  . Prostate cancer Father   . Testicular cancer Son    History  Sexual Activity  . Sexual Activity: Not on file    Outpatient Encounter Prescriptions as of 07/19/2015  Medication Sig  . acetaminophen (TYLENOL) 325 MG tablet Take 650 mg by mouth every 6 (six) hours as needed for pain. Pain  . aspirin EC 81 MG tablet Take 1 tablet (81 mg total) by mouth daily.  [name] gabapentin (NEURONTIN) 100 MG capsule  Take 1cap at bedtime x7 days, then 1 cap twice daily x7 days, then 1 cap three times daily  . Omega-3 Fatty Acids (FISH OIL) 1000 MG CAPS Take by mouth.  [name] omeprazole (PRILOSEC) 20 MG capsule Take 1 capsule (20 mg total) by mouth daily.  . simvastatin (ZOCOR) 40 MG tablet TAKE 1 TABLET BY MOUTH EVERY DAY  . vitamin C (ASCORBIC ACID) 250 MG tablet Take 250 mg by mouth daily.  . [DISCONTINUED] doxycycline (VIBRA-TABS) 100 MG tablet Take two tablets by mouth ( total of 200 mg) once for lyme prophylaxis.   No facility-administered encounter medications on file as of 07/19/2015.    Activities of Daily Living In your present state of health, do you have any difficulty performing the following activities: 07/19/2015 09/25/2014  Hearing? N N  Vision? N N  Difficulty concentrating or making decisions? N N  Walking or climbing stairs? N N  Dressing or bathing? N N  Doing errands, shopping? N Y  Conservation officer, nature and eating ? N -  Using the Toilet? N -  In the past six months, have you accidently leaked urine? (No Data) -  Do you have problems with loss of bowel control? N -  Managing your Medications? N -  Managing your Finances? N -  Housekeeping or managing your Housekeeping? N -    Patient Care Team: Binnie Rail, MD as PCP - General (Internal Medicine) Rana Snare, MD as Consulting Physician (Urology) Marybelle Killings, MD as Consulting Physician  (Orthopedic Surgery)   Assessment:      Exercise Activities and Dietary recommendations Current Exercise Habits: Home exercise routine, Type of exercise: walking (walking limited by right foot; will defer to Dr. Quay Burow; did discuss strength training to avoid loss of muscle tone)  Goals    . <enter goal here>     To continue to enjoy life/ Discussed walking everyday would help increase HDL     . exercise     Will have right foot checked so he can walk more;  Will also try to cut back on sodium       Fall Risk Fall Risk  07/19/2015 04/14/2015 07/14/2014 03/17/2014 02/28/2013  Falls in the past year? No - No Yes Yes  Number falls in past yr: - 2 or more - 1 2 or more  Injury with Fall? - Yes - No -  Risk Factor Category  - High Fall Risk - - -  Risk for fall due to : - - (No Data) - -  Risk for fall due to (comments): - - has trilevel house; no problem; laundry on bottom floor - -  Follow up - Falls evaluation completed - - -   Depression Screen PHQ 2/9 Scores 07/19/2015 07/14/2014 03/17/2014 02/28/2013  PHQ - 2 Score 0 0 0 0    Cognitive Testing MMSE - Mini Mental State Exam 07/14/2014  Not completed: Unable to complete  Ad8 score 0   Immunization History  Administered Date(s) Administered  . Influenza Split 12/20/2010, 12/13/2011  . Influenza Whole 12/06/2007, 01/05/2009, 12/06/2009  . Influenza, High Dose Seasonal PF 12/18/2013, 12/07/2014  . Influenza,inj,Quad PF,36+ Mos 01/01/2013  . Pneumococcal Conjugate-13 07/14/2014  . Pneumococcal Polysaccharide-23 02/28/2013  . Tdap 06/06/2005  . Zoster 03/01/2011   Screening Tests Health Maintenance  Topic Date Due  . TETANUS/TDAP  06/07/2015  . INFLUENZA VACCINE  10/12/2015  . ZOSTAVAX  Completed  . PNA vac Low Risk Adult  Completed  Plan:     Would recommend a hearing exam; given resources for free aid / or adaptive equipment for phone  Will make fup apt for right foot; declined apt with Dr. Tamala Julian but would like to be  referred to ortho; Dr. Quay Burow to review. Also will review pain in left wrist;   BP elevated and averaging 140/80 up to 180 over/ 80. Loves sodium. Given information regarding the DASH diet; Will consider making changes in diet  May need medication;  TD deferred today due to back order in office.   Recent tick bit reaction now resolved    During the course of the visit the patient was educated and counseled about the following appropriate screening and preventive services:   Vaccines to include Pneumoccal, Influenza, Hepatitis B, Td, Zostavax, HCV/ TD deferred   Electrocardiogram- 09/2014  Cardiovascular Disease/ none to date/ BP being followed and recorded by the patient   Colorectal cancer screening/ 10/2014 aged out  Diabetes screening/ neg  Prostate Cancer Screening/ urology following; Hx prostatectomy per the patient  Glaucoma screening; due 2018  Nutrition counseling / low sodium discussed  Smoking cessation counseling/ quit ;  Patient Instructions (the written plan) was given to the patient.    [id number], RN  07/19/2015   Medical screening examination/treatment/procedure(s) were performed by non-physician practitioner and as supervising physician I was immediately available for consultation/collaboration. I agree with above. Binnie Rail, MD

## 2015-07-19 NOTE — Patient Instructions (Addendum)
Ronald Stafford , Thank you for taking time to come for your Medicare Wellness Visit. I appreciate your ongoing commitment to your health goals. Please review the following plan we discussed and let me know if I can assist you in the future.   Would recommend a hearing exam ( will give on free hearing aid; also to fup on adaptive device to help you hear better on the phone)  Deaf & Hard of Hearing Division Services  No reviews  [facility]  [phone]  [phone]   Will make apt with Dr. Quay Burow regarding left wrist and right foot issues for referral;     These are the goals we discussed: Goals    . <enter goal here>     To continue to enjoy life/ Discussed walking everyday would help increase HDL     . exercise     Will have right foot checked so he can walk more;  Will also try to cut back on sodium        This is a list of the screening recommended for you and due dates:  Health Maintenance  Topic Date Due  . Tetanus Vaccine  06/07/2015  . Flu Shot  10/12/2015  . Shingles Vaccine  Completed  . Pneumonia vaccines  Completed   Low-Sodium Eating Plan Sodium raises blood pressure and causes water to be held in the body. Getting less sodium from food will help lower your blood pressure, reduce any swelling, and protect your heart, liver, and kidneys. We get sodium by adding salt (sodium chloride) to food. Most of our sodium comes from canned, boxed, and frozen foods. Restaurant foods, fast foods, and pizza are also very high in sodium. Even if you take medicine to lower your blood pressure or to reduce fluid in your body, getting less sodium from your food is important. WHAT IS MY PLAN? Most people should limit their sodium intake to 2,300 mg a day. Your health care provider recommends that you limit your sodium intake to __________ a day.  WHAT DO I NEED TO KNOW ABOUT THIS EATING PLAN? For the low-sodium eating plan, you will follow these general  guidelines:  Choose foods with a % Daily Value for sodium of less than 5% (as listed on the food label).   Use salt-free seasonings or herbs instead of table salt or sea salt.   Check with your health care provider or pharmacist before using salt substitutes.   Eat fresh foods.  Eat more vegetables and fruits.  Limit canned vegetables. If you do use them, rinse them well to decrease the sodium.   Limit cheese to 1 oz (28 g) per day.   Eat lower-sodium products, often labeled as "lower sodium" or "no salt added."  Avoid foods that contain monosodium glutamate (MSG). MSG is sometimes added to [place] food and some canned foods.  Check food labels (Nutrition Facts labels) on foods to learn how much sodium is in one serving.  Eat more home-cooked food and less restaurant, buffet, and fast food.  When eating at a restaurant, ask that your food be prepared with less salt, or no salt if possible.  HOW DO I READ FOOD LABELS FOR SODIUM INFORMATION? The Nutrition Facts label lists the amount of sodium in one serving of the food. If you eat more than one serving, you must multiply the listed amount of sodium by the number of servings. Food labels may also identify foods as:  Sodium  free--Less than 5 mg in a serving.  Very low sodium--35 mg or less in a serving.  Low sodium--140 mg or less in a serving.  Light in sodium--50% less sodium in a serving. For example, if a food that usually has 300 mg of sodium is changed to become light in sodium, it will have 150 mg of sodium.  Reduced sodium--25% less sodium in a serving. For example, if a food that usually has 400 mg of sodium is changed to reduced sodium, it will have 300 mg of sodium. WHAT FOODS CAN I EAT? Grains Low-sodium cereals, including oats, puffed wheat and rice, and shredded wheat cereals. Low-sodium crackers. Unsalted rice and pasta. Lower-sodium bread.  Vegetables Frozen or fresh vegetables. Low-sodium or  reduced-sodium canned vegetables. Low-sodium or reduced-sodium tomato sauce and paste. Low-sodium or reduced-sodium tomato and vegetable juices.  Fruits Fresh, frozen, and canned fruit. Fruit juice.  Meat and Other Protein Products Low-sodium canned tuna and salmon. Fresh or frozen meat, poultry, seafood, and fish. Lamb. Unsalted nuts. Dried beans, peas, and lentils without added salt. Unsalted canned beans. Homemade soups without salt. Eggs.  Dairy Milk. Soy milk. Ricotta cheese. Low-sodium or reduced-sodium cheeses. Yogurt.  Condiments Fresh and dried herbs and spices. Salt-free seasonings. Onion and garlic powders. Low-sodium varieties of mustard and ketchup. Fresh or refrigerated horseradish. Lemon juice.  Fats and Oils Reduced-sodium salad dressings. Unsalted butter.  Other Unsalted popcorn and pretzels.  The items listed above may not be a complete list of recommended foods or beverages. Contact your dietitian for more options. WHAT FOODS ARE NOT RECOMMENDED? Grains Instant hot cereals. Bread stuffing, pancake, and biscuit mixes. Croutons. Seasoned rice or pasta mixes. Noodle soup cups. Boxed or frozen macaroni and cheese. Self-rising flour. Regular salted crackers. Vegetables Regular canned vegetables. Regular canned tomato sauce and paste. Regular tomato and vegetable juices. Frozen vegetables in sauces. Salted [place] fries. Olives. Angie Fava. Relishes. Sauerkraut. Salsa. Meat and Other Protein Products Salted, canned, smoked, spiced, or pickled meats, seafood, or fish. Bacon, ham, sausage, hot dogs, corned beef, chipped beef, and packaged luncheon meats. Salt pork. Jerky. Pickled herring. Anchovies, regular canned tuna, and sardines. Salted nuts. Dairy Processed cheese and cheese spreads. Cheese curds. Blue cheese and cottage cheese. Buttermilk.  Condiments Onion and garlic salt, seasoned salt, table salt, and sea salt. Canned and packaged gravies. Worcestershire sauce.  Tartar sauce. Barbecue sauce. Teriyaki sauce. Soy sauce, including reduced sodium. Steak sauce. Fish sauce. Oyster sauce. Cocktail sauce. Horseradish that you find on the shelf. Regular ketchup and mustard. Meat flavorings and tenderizers. Bouillon cubes. Hot sauce. Tabasco sauce. Marinades. Taco seasonings. Relishes. Fats and Oils Regular salad dressings. Salted butter. Margarine. Ghee. Bacon fat.  Other Potato and tortilla chips. Corn chips and puffs. Salted popcorn and pretzels. Canned or dried soups. Pizza. Frozen entrees and pot pies.  The items listed above may not be a complete list of foods and beverages to avoid. Contact your dietitian for more information.   This information is not intended to replace advice given to you by your health care provider. Make sure you discuss any questions you have with your health care provider.   Document Released: 08/19/2001 Document Revised: 03/20/2014 Document Reviewed: 01/01/2013 Elsevier Interactive Patient Education 2016 [place] in the Home  Falls can cause injuries. They can happen to people of all ages. There are many things you can do to make your home safe and to help prevent falls.  WHAT CAN I DO ON THE  OUTSIDE OF MY HOME?  Regularly fix the edges of walkways and driveways and fix any cracks.  Remove anything that might make you trip as you walk through a door, such as a raised step or threshold.  Trim any bushes or trees on the path to your home.  Use bright outdoor lighting.  Clear any walking paths of anything that might make someone trip, such as rocks or tools.  Regularly check to see if handrails are loose or broken. Make sure that both sides of any steps have handrails.  Any raised decks and porches should have guardrails on the edges.  Have any leaves, snow, or ice cleared regularly.  Use sand or salt on walking paths during winter.  Clean up any spills in your garage right away. This includes  oil or grease spills. WHAT CAN I DO IN THE BATHROOM?   Use night lights.  Install grab bars by the toilet and in the tub and shower. Do not use towel bars as grab bars.  Use non-skid mats or decals in the tub or shower.  If you need to sit down in the shower, use a plastic, non-slip stool.  Keep the floor dry. Clean up any water that spills on the floor as soon as it happens.  Remove soap buildup in the tub or shower regularly.  Attach bath mats securely with double-sided non-slip rug tape.  Do not have throw rugs and other things on the floor that can make you trip. WHAT CAN I DO IN THE BEDROOM?  Use night lights.  Make sure that you have a light by your bed that is easy to reach.  Do not use any sheets or blankets that are too big for your bed. They should not hang down onto the floor.  Have a firm chair that has side arms. You can use this for support while you get dressed.  Do not have throw rugs and other things on the floor that can make you trip. WHAT CAN I DO IN THE KITCHEN?  Clean up any spills right away.  Avoid walking on wet floors.  Keep items that you use a lot in easy-to-reach places.  If you need to reach something above you, use a strong step stool that has a grab bar.  Keep electrical cords out of the way.  Do not use floor polish or wax that makes floors slippery. If you must use wax, use non-skid floor wax.  Do not have throw rugs and other things on the floor that can make you trip. WHAT CAN I DO WITH MY STAIRS?  Do not leave any items on the stairs.  Make sure that there are handrails on both sides of the stairs and use them. Fix handrails that are broken or loose. Make sure that handrails are as long as the stairways.  Check any carpeting to make sure that it is firmly attached to the stairs. Fix any carpet that is loose or worn.  Avoid having throw rugs at the top or bottom of the stairs. If you do have throw rugs, attach them to the floor  with carpet tape.  Make sure that you have a light switch at the top of the stairs and the bottom of the stairs. If you do not have them, ask someone to add them for you. WHAT ELSE CAN I DO TO HELP PREVENT FALLS?  Wear shoes that:  Do not have high heels.  Have rubber bottoms.  Are comfortable and fit  you well.  Are closed at the toe. Do not wear sandals.  If you use a stepladder:  Make sure that it is fully opened. Do not climb a closed stepladder.  Make sure that both sides of the stepladder are locked into place.  Ask someone to hold it for you, if possible.  Clearly mark and make sure that you can see:  Any grab bars or handrails.  First and last steps.  Where the edge of each step is.  Use tools that help you move around (mobility aids) if they are needed. These include:  Canes.  Walkers.  Scooters.  Crutches.  Turn on the lights when you go into a dark area. Replace any light bulbs as soon as they burn out.  Set up your furniture so you have a clear path. Avoid moving your furniture around.  If any of your floors are uneven, fix them.  If there are any pets around you, be aware of where they are.  Review your medicines with your doctor. Some medicines can make you feel dizzy. This can increase your chance of falling. Ask your doctor what other things that you can do to help prevent falls.   This information is not intended to replace advice given to you by your health care provider. Make sure you discuss any questions you have with your health care provider.   Document Released: 12/24/2008 Document Revised: 07/14/2014 Document Reviewed: 04/03/2014 Elsevier Interactive Patient Education 2016 [place] Maintenance, Male A healthy lifestyle and preventative care can promote health and wellness.  Maintain regular health, dental, and eye exams.  Eat a healthy diet. Foods like vegetables, fruits, whole grains, low-fat dairy products, and lean  protein foods contain the nutrients you need and are low in calories. Decrease your intake of foods high in solid fats, added sugars, and salt. Get information about a proper diet from your health care provider, if necessary.  Regular physical exercise is one of the most important things you can do for your health. Most adults should get at least 150 minutes of moderate-intensity exercise (any activity that increases your heart rate and causes you to sweat) each week. In addition, most adults need muscle-strengthening exercises on 2 or more days a week.   Maintain a healthy weight. The body mass index (BMI) is a screening tool to identify possible weight problems. It provides an estimate of body fat based on height and weight. Your health care provider can find your BMI and can help you achieve or maintain a healthy weight. For males 20 years and older:  A BMI below 18.5 is considered underweight.  A BMI of 18.5 to 24.9 is normal.  A BMI of 25 to 29.9 is considered overweight.  A BMI of 30 and above is considered obese.  Maintain normal blood lipids and cholesterol by exercising and minimizing your intake of saturated fat. Eat a balanced diet with plenty of fruits and vegetables. Blood tests for lipids and cholesterol should begin at age 44 and be repeated every 5 years. If your lipid or cholesterol levels are high, you are over age 62, or you are at high risk for heart disease, you may need your cholesterol levels checked more frequently.Ongoing high lipid and cholesterol levels should be treated with medicines if diet and exercise are not working.  If you smoke, find out from your health care provider how to quit. If you do not use tobacco, do not start.  Lung cancer screening is  recommended for adults aged 61-80 years who are at high risk for developing lung cancer because of a history of smoking. A yearly low-dose CT scan of the lungs is recommended for people who have at least a 30-pack-year  history of smoking and are current smokers or have quit within the past 15 years. A pack year of smoking is smoking an average of 1 pack of cigarettes a day for 1 year (for example, a 30-pack-year history of smoking could mean smoking 1 pack a day for 30 years or 2 packs a day for 15 years). Yearly screening should continue until the smoker has stopped smoking for at least 15 years. Yearly screening should be stopped for people who develop a health problem that would prevent them from having lung cancer treatment.  If you choose to drink alcohol, do not have more than 2 drinks per day. One drink is considered to be 12 oz (360 mL) of beer, 5 oz (150 mL) of wine, or 1.5 oz (45 mL) of liquor.  Avoid the use of street drugs. Do not share needles with anyone. Ask for help if you need support or instructions about stopping the use of drugs.  High blood pressure causes heart disease and increases the risk of stroke. High blood pressure is more likely to develop in:  People who have blood pressure in the end of the normal range (100-139/85-89 mm Hg).  People who are overweight or obese.  People who are African American.  If you are 5-45 years of age, have your blood pressure checked every 3-5 years. If you are 42 years of age or older, have your blood pressure checked every year. You should have your blood pressure measured twice--once when you are at a hospital or clinic, and once when you are not at a hospital or clinic. Record the average of the two measurements. To check your blood pressure when you are not at a hospital or clinic, you can use:  An automated blood pressure machine at a pharmacy.  A home blood pressure monitor.  If you are 59-5 years old, ask your health care provider if you should take aspirin to prevent heart disease.  Diabetes screening involves taking a blood sample to check your fasting blood sugar level. This should be done once every 3 years after age 50 if you are at a  normal weight and without risk factors for diabetes. Testing should be considered at a younger age or be carried out more frequently if you are overweight and have at least 1 risk factor for diabetes.  Colorectal cancer can be detected and often prevented. Most routine colorectal cancer screening begins at the age of 76 and continues through age 41. However, your health care provider may recommend screening at an earlier age if you have risk factors for colon cancer. On a yearly basis, your health care provider may provide home test kits to check for hidden blood in the stool. A small camera at the end of a tube may be used to directly examine the colon (sigmoidoscopy or colonoscopy) to detect the earliest forms of colorectal cancer. Talk to your health care provider about this at age 63 when routine screening begins. A direct exam of the colon should be repeated every 5-10 years through age 35, unless early forms of precancerous polyps or small growths are found.  People who are at an increased risk for hepatitis B should be screened for this virus. You are considered at high risk for  hepatitis B if:  You were born in a country where hepatitis B occurs often. Talk with your health care provider about which countries are considered high risk.  Your parents were born in a high-risk country and you have not received a shot to protect against hepatitis B (hepatitis B vaccine).  You have HIV or AIDS.  You use needles to inject street drugs.  You live with, or have sex with, someone who has hepatitis B.  You are a man who has sex with other men (MSM).  You get hemodialysis treatment.  You take certain medicines for conditions like cancer, organ transplantation, and autoimmune conditions.  Hepatitis C blood testing is recommended for all people born from [birth year] through 1963 and any individual with known risk factors for hepatitis C.  Healthy men should no longer receive prostate-specific antigen  (PSA) blood tests as part of routine cancer screening. Talk to your health care provider about prostate cancer screening.  Testicular cancer screening is not recommended for adolescents or adult males who have no symptoms. Screening includes self-exam, a health care provider exam, and other screening tests. Consult with your health care provider about any symptoms you have or any concerns you have about testicular cancer.  Practice safe sex. Use condoms and avoid high-risk sexual practices to reduce the spread of sexually transmitted infections (STIs).  You should be screened for STIs, including gonorrhea and chlamydia if:  You are sexually active and are younger than 24 years.  You are older than 24 years, and your health care provider tells you that you are at risk for this type of infection.  Your sexual activity has changed since you were last screened, and you are at an increased risk for chlamydia or gonorrhea. Ask your health care provider if you are at risk.  If you are at risk of being infected with HIV, it is recommended that you take a prescription medicine daily to prevent HIV infection. This is called pre-exposure prophylaxis (PrEP). You are considered at risk if:  You are a man who has sex with other men (MSM).  You are a heterosexual man who is sexually active with multiple partners.  You take drugs by injection.  You are sexually active with a partner who has HIV.  Talk with your health care provider about whether you are at high risk of being infected with HIV. If you choose to begin PrEP, you should first be tested for HIV. You should then be tested every 3 months for as long as you are taking PrEP.  Use sunscreen. Apply sunscreen liberally and repeatedly throughout the day. You should seek shade when your shadow is shorter than you. Protect yourself by wearing long sleeves, pants, a wide-brimmed hat, and sunglasses year round whenever you are outdoors.  Tell your health  care provider of new moles or changes in moles, especially if there is a change in shape or color. Also, tell your health care provider if a mole is larger than the size of a pencil eraser.  A one-time screening for abdominal aortic aneurysm (AAA) and surgical repair of large AAAs by ultrasound is recommended for men aged 38-75 years who are current or former smokers.  Stay current with your vaccines (immunizations).   This information is not intended to replace advice given to you by your health care provider. Make sure you discuss any questions you have with your health care provider.   Document Released: 08/26/2007 Document Revised: 03/20/2014 Document Reviewed: 07/25/2010 Elsevier Interactive  Patient Education 2016 [employer].  Hearing Loss Hearing loss is a partial or total loss of the ability to hear. This can be temporary or permanent, and it can happen in one or both ears. Hearing loss may be referred to as deafness. Medical care is necessary to treat hearing loss properly and to prevent the condition from getting worse. Your hearing may partially or completely come back, depending on what caused your hearing loss and how severe it is. In some cases, hearing loss is permanent. CAUSES Common causes of hearing loss include:   Too much wax in the ear canal.   Infection of the ear canal or middle ear.   Fluid in the middle ear.   Injury to the ear or surrounding area.   An object stuck in the ear.   Prolonged exposure to loud sounds, such as music.  Less common causes of hearing loss include:   Tumors in the ear.   Viral or bacterial infections, such as meningitis.   A hole in the eardrum (perforated eardrum).  Problems with the hearing nerve that sends signals between the brain and the ear.  Certain medicines.  SYMPTOMS  Symptoms of this condition may include:  Difficulty telling the difference between sounds.  Difficulty following a conversation when  there is background noise.  Lack of response to sounds in your environment. This may be most noticeable when you do not respond to startling sounds.  Needing to turn up the volume on the television, radio, etc.  Ringing in the ears.  Dizziness.  Pain in the ears. DIAGNOSIS This condition is diagnosed based on a physical exam and a hearing test (audiometry). The audiometry test will be performed by a hearing specialist (audiologist). You may also be referred to an ear, nose, and throat (ENT) specialist (otolaryngologist).  TREATMENT Treatment for recent onset of hearing loss may include:   Ear wax removal.   Being prescribed medicines to prevent infection (antibiotics).   Being prescribed medicines to reduce inflammation (corticosteroids).  HOME CARE INSTRUCTIONS  If you were prescribed an antibiotic medicine, take it as told by your health care provider. Do not stop taking the antibiotic even if you start to feel better.  Take over-the-counter and prescription medicines only as told by your health care provider.  Avoid loud noises.   Return to your normal activities as told by your health care provider. Ask your health care provider what activities are safe for you.  Keep all follow-up visits as told by your health care provider. This is important. SEEK MEDICAL CARE IF:   You feel dizzy.   You develop new symptoms.   You vomit or feel nauseous.   You have a fever.  SEEK IMMEDIATE MEDICAL CARE IF:  You develop sudden changes in your vision.   You have severe ear pain.   You have new or increased weakness.  You have a severe headache.   This information is not intended to replace advice given to you by your health care provider. Make sure you discuss any questions you have with your health care provider.   Document Released: 02/27/2005 Document Revised: 11/18/2014 Document Reviewed: 07/15/2014 Elsevier Interactive Patient Education [employer].

## 2015-08-11 ENCOUNTER — Encounter: Payer: Self-pay | Admitting: Internal Medicine

## 2015-08-11 ENCOUNTER — Ambulatory Visit (INDEPENDENT_AMBULATORY_CARE_PROVIDER_SITE_OTHER): Payer: Medicare Other | Admitting: Internal Medicine

## 2015-08-11 VITALS — BP 146/78 | HR 87 | Temp 97.8°F | Resp 16 | Wt 189.0 lb

## 2015-08-11 DIAGNOSIS — I10 Essential (primary) hypertension: Secondary | ICD-10-CM | POA: Diagnosis not present

## 2015-08-11 DIAGNOSIS — M25532 Pain in left wrist: Secondary | ICD-10-CM | POA: Diagnosis not present

## 2015-08-11 MED ORDER — GABAPENTIN 100 MG PO CAPS: 100.0000 mg | ORAL_CAPSULE | Freq: Every day | ORAL | Status: DC

## 2015-08-11 NOTE — Progress Notes (Signed)
Pre visit review using our clinic review tool, if applicable. No additional management support is needed unless otherwise documented below in the visit note. 

## 2015-08-11 NOTE — Progress Notes (Signed)
Subjective:    Patient ID: Ronald Stafford, male    DOB: 1930-12-28, 82 y.o.   MRN: [id number]  HPI  He  is here for follow-up of his blood pressure and he is here for left wrist pain.  Hypertension: He is taking his medication daily. He is compliant with a low sodium diet.  He denies chest pain, palpitations, edema, shortness of breath and regular headaches. He is exercising regularly - very active in the yard and in his house.  He does monitor his blood pressure at home 134/78, 147/78.     Pain in left wrist:  He has pain in his left wrist that radiates to the thumb.  It has been going on for about 3-4 weeks.  It is worse with bending the wrist.  He has a burning sensation in forearm/wrist area.  He denies any weakness.   Medications and allergies reviewed with patient and updated if appropriate.  Patient Active Problem List   Diagnosis Date Noted  . Frequent headaches 06/22/2015  . Prediabetes 02/11/2015  . Episodic paroxysmal hemicrania, not intractable 02/11/2015  . Occult blood positive stool 09/27/2014  . CAP (community acquired pneumonia) 09/25/2014  . Peroneal tendonitis of right lower extremity 01/12/2014  . Mass of right ankle 12/31/2013  . Right ankle instability 12/31/2013  . Radial head fracture, closed 09/18/2013  . Lateral epicondylitis of right elbow 09/16/2013  . Solitary pulmonary nodule 08/08/2012  . FATIGUE 05/27/2010  . Hypoxemia 05/23/2010  . SPINAL STENOSIS, LUMBAR 04/06/2010  . LOW BACK PAIN SYNDROME 11/02/2009  . FOOT DROP, RIGHT 05/21/2008  . Other nonthrombocytopenic purpuras 10/21/2007  . Hyperlipidemia 12/25/2006  . DEPRESSION 12/25/2006  . Essential hypertension 12/25/2006  . GERD 12/25/2006  . PROSTATE CANCER, HX OF 12/25/2006  . POLYP, COLON 05/02/2006    Current Outpatient Prescriptions on File Prior to Visit  Medication Sig Dispense Refill  . acetaminophen (TYLENOL) 325 MG tablet Take 650 mg by mouth every 6 (six) hours as needed for  pain. Pain    . aspirin EC 81 MG tablet Take 1 tablet (81 mg total) by mouth daily.    [name] gabapentin (NEURONTIN) 100 MG capsule Take 1cap at bedtime x7 days, then 1 cap twice daily x7 days, then 1 cap three times daily 90 capsule 0  . Omega-3 Fatty Acids (FISH OIL) 1000 MG CAPS Take by mouth.    [name] omeprazole (PRILOSEC) 20 MG capsule Take 1 capsule (20 mg total) by mouth daily. 90 capsule 3  . simvastatin (ZOCOR) 40 MG tablet TAKE 1 TABLET BY MOUTH EVERY DAY 90 tablet 2  . vitamin C (ASCORBIC ACID) 250 MG tablet Take 250 mg by mouth daily.     No current facility-administered medications on file prior to visit.    Past Medical History  Diagnosis Date  . Depression   . GERD (gastroesophageal reflux disease)   . Hyperlipidemia   . Hypertension   . Deep venous thrombosis ([place]) 1997    Postop  . Colon polyps   . Shoulder fracture, left 02/23/2011    immobilized in sling , Dr Norris(no surgery)  . Cancer [facility]) 2004    prostate cancer, Dr. Risa Grill    Past Surgical History  Procedure Laterality Date  . Prostatectomy  2004    w/ radiation, Dr Risa Grill  . Joint replacement  1997, 2001    Total Hip replacement X 2  . Colonoscopy  2006    Negative,Dr.Edwards  . Meckel diverticulum excision  Age 34  . Vasectomy    . Lumbar spine surgery  09/28/2010    Dr Rolena Infante; for spinal stenosis  . Colonoscopy with polypectomy       PMH of    Social History   Social History  . Marital Status: Widowed    Spouse Name: N/A  . Number of Children: N/A  . Years of Education: N/A   Social History Main Topics  . Smoking status: Former Smoker -- 0.50 packs/day for 5 years    Types: Cigarettes    Quit date: 03/13/1988  . Smokeless tobacco: Not on file     Comment: Quit at about age 22 / chewed tobacca x 1 year  . Alcohol Use: No  . Drug Use: No  . Sexual Activity: Not on file   Other Topics Concern  . Not on file   Social History Narrative   Lives alone; Wife passed 2012/12/16;    Died of ovarian cancer; breast cancer; other cancer;          Family History  Problem Relation Age of Onset  . Prostate cancer Father   . Testicular cancer Son     Review of Systems  Constitutional: Negative for fever and fatigue.  Cardiovascular: Negative for chest pain, palpitations and leg swelling.  Skin: Negative for rash.  Neurological: Positive for headaches (occ). Negative for light-headedness.       Objective:   Filed Vitals:   08/11/15 1109  BP: 146/78  Pulse: 87  Temp: 97.8 F (36.6 C)  Resp: 16   Filed Weights   08/11/15 1109  Weight: 189 lb (85.73 kg)   Body mass index is 24.26 kg/(m^2).   Physical Exam Constitutional: Appears well-developed and well-nourished. No distress.  Neck: Neck supple. No tracheal deviation present. No thyromegaly present.  No carotid bruit. No cervical adenopathy.   Cardiovascular: Normal rate, regular rhythm and normal heart sounds.   No murmur heard.  No edema Pulmonary/Chest: Effort normal and breath sounds normal. No respiratory distress. No wheezes.  Ext: left wrist without deformity, no tenderness in wrist or thumb, normal sensation in hand, normal strength       Assessment & Plan:   See Problem List for Assessment and Plan of chronic medical problems.

## 2015-08-11 NOTE — Assessment & Plan Note (Signed)
Possible tendinitis vs CTS Will refer to hand surgery

## 2015-08-11 NOTE — Patient Instructions (Signed)
A referral was ordered for a hand orthopedic.  Continue to monitor your blood pressure at home.

## 2015-08-11 NOTE — Assessment & Plan Note (Signed)
BP well controlled at home Continue current medications at current doses Continue to monitor at home

## 2015-08-16 ENCOUNTER — Encounter (HOSPITAL_COMMUNITY): Payer: Self-pay | Admitting: *Deleted

## 2015-08-16 ENCOUNTER — Ambulatory Visit (HOSPITAL_COMMUNITY)
Admission: EM | Admit: 2015-08-16 | Discharge: 2015-08-16 | Disposition: A | Discharge status: Home / Self Care | Payer: Medicare Other | Attending: Emergency Medicine | Admitting: Emergency Medicine

## 2015-08-16 DIAGNOSIS — B029 Zoster without complications: Secondary | ICD-10-CM | POA: Diagnosis not present

## 2015-08-16 MED ORDER — TRIAMCINOLONE ACETONIDE 0.1 % EX CREA: 1.0000 "application " | TOPICAL_CREAM | Freq: Two times a day (BID) | CUTANEOUS | Status: DC | PRN

## 2015-08-16 MED ORDER — VALACYCLOVIR HCL 1 G PO TABS: 1000.0000 mg | ORAL_TABLET | Freq: Three times a day (TID) | ORAL | Status: AC

## 2015-08-16 NOTE — Discharge Instructions (Signed)
I suspect you have shingles. Take Valtrex 3 times a day for 7 days. Use the triamcinolone cream twice a day as needed for itching or discomfort. Follow-up as needed.

## 2015-08-16 NOTE — ED Provider Notes (Signed)
CSN: [id number]     Arrival date & time 08/16/15  1558 History   First MD Initiated Contact with Patient 08/16/15 1633     Chief Complaint  Patient presents with  . Rash   (Consider location/radiation/quality/duration/timing/severity/associated sxs/prior Treatment) HPI He is an 82 year old man here for evaluation of rash. He states he noticed it yesterday. It is located on the top of his left shoulder. Initially it was itchy, but now it's a tingling sensation. He states there are a few additional spots coming up on his neck. He has been out in the woods, but never without a shirt on. He did have chickenpox as a child. He does report getting the shingles vaccine several years ago.  Past Medical History  Diagnosis Date  . Depression   . GERD (gastroesophageal reflux disease)   . Hyperlipidemia   . Hypertension   . Deep venous thrombosis ([place]) 1997    Postop  . Colon polyps   . Shoulder fracture, left 02/23/2011    immobilized in sling , Dr Norris(no surgery)  . Cancer [facility]) 2004    prostate cancer, Dr. Risa Grill   Past Surgical History  Procedure Laterality Date  . Prostatectomy  2004    w/ radiation, Dr Risa Grill  . Joint replacement  1997, 2001    Total Hip replacement X 2  . Colonoscopy  2006    Negative,Dr.Edwards  . Meckel diverticulum excision      Age 73  . Vasectomy    . Lumbar spine surgery  09/28/2010    Dr Rolena Infante; for spinal stenosis  . Colonoscopy with polypectomy       PMH of   Family History  Problem Relation Age of Onset  . Prostate cancer Father   . Testicular cancer Son    Social History  Substance Use Topics  . Smoking status: Former Smoker -- 0.50 packs/day for 5 years    Types: Cigarettes    Quit date: 03/13/1988  . Smokeless tobacco: None     Comment: Quit at about age 50 / chewed tobacca x 1 year  . Alcohol Use: No    Review of Systems As in history of present illness Allergies  Chicken allergy  Home Medications   Prior to Admission  medications   Medication Sig Start Date End Date Taking? Authorizing Provider  aspirin EC 81 MG tablet Take 1 tablet (81 mg total) by mouth daily. 02/11/15  Yes Binnie Rail, MD  gabapentin (NEURONTIN) 100 MG capsule Take 1 capsule (100 mg total) by mouth at bedtime. 08/11/15  Yes Binnie Rail, MD  Omega-3 Fatty Acids (FISH OIL) 1000 MG CAPS Take by mouth.   Yes Historical Provider, MD  omeprazole (PRILOSEC) 20 MG capsule Take 1 capsule (20 mg total) by mouth daily. 06/22/15  Yes Binnie Rail, MD  simvastatin (ZOCOR) 40 MG tablet TAKE 1 TABLET BY MOUTH EVERY DAY 01/28/15  Yes Hendricks Limes, MD  acetaminophen (TYLENOL) 325 MG tablet Take 650 mg by mouth every 6 (six) hours as needed for pain. Pain    Historical Provider, MD  triamcinolone cream (KENALOG) 0.1 % Apply 1 application topically 2 (two) times daily as needed. Pain or itching 08/16/15   Melony Overly, MD  valACYclovir (VALTREX) 1000 MG tablet Take 1 tablet (1,000 mg total) by mouth 3 (three) times daily. 08/16/15 08/30/15  Melony Overly, MD  vitamin C (ASCORBIC ACID) 250 MG tablet Take 250 mg by mouth daily.    Historical Provider,  MD   Meds Ordered and Administered this Visit  Medications - No data to display  BP 162/83 mmHg  Pulse 85  Temp(Src) 97.8 F (36.6 C) (Oral)  Resp 18  SpO2 99% No data found.   Physical Exam  Constitutional: He is oriented to person, place, and time. He appears well-developed and well-nourished. No distress.  Cardiovascular: Normal rate.   Pulmonary/Chest: Effort normal.  Neurological: He is alert and oriented to person, place, and time.  Skin: Rash (he has an erythematous rash on the top of the left shoulder. It is papulovesicular. He appears to have some faint lesions coming up on his neck.) noted.    ED Course  Procedures (including critical care time)  Labs Review Labs Reviewed - No data to display  Imaging Review No results found.   MDM   1. Shingles    I suspect this is  shingles. We'll treat with Valtrex and triamcinolone cream. Follow-up as needed.    Melony Overly, MD 08/16/15 [id number]

## 2015-08-16 NOTE — ED Notes (Signed)
Patient with red itchy area to left shoulder since yesterday, patient states he was out in the woods then noticed.

## 2015-08-17 ENCOUNTER — Ambulatory Visit: Payer: Medicare Other | Admitting: Neurology

## 2015-08-19 ENCOUNTER — Ambulatory Visit (INDEPENDENT_AMBULATORY_CARE_PROVIDER_SITE_OTHER): Payer: Medicare Other | Admitting: Neurology

## 2015-08-19 ENCOUNTER — Encounter: Payer: Self-pay | Admitting: Neurology

## 2015-08-19 VITALS — BP 144/60 | HR 89 | Ht 74.0 in | Wt 190.0 lb

## 2015-08-19 DIAGNOSIS — G44229 Chronic tension-type headache, not intractable: Secondary | ICD-10-CM | POA: Diagnosis not present

## 2015-08-19 DIAGNOSIS — G5602 Carpal tunnel syndrome, left upper limb: Secondary | ICD-10-CM

## 2015-08-19 DIAGNOSIS — I10 Essential (primary) hypertension: Secondary | ICD-10-CM | POA: Diagnosis not present

## 2015-08-19 NOTE — Progress Notes (Signed)
NEUROLOGY FOLLOW UP OFFICE NOTE  DAMARRIUS KULAS [id number]  HISTORY OF PRESENT ILLNESS: Ronald Stafford is an 82 year old right-handed male with hypertension, hyperlipidemia, GERD, lumbar spinal stenosis and history of prostate cancer who follows up for headache, as well as a new problem, namely left wrist and thumb pain.  UPDATE: Sed Rate from 04/14/15 was 8.  MRI of brain and cervical spine was performed on 04/22/15.  MRI of brain was unremarkable.  There was some multilevel cervical stenosis with mild associated spinal cord mass effect but nothing severe.  Dr. Christella Noa from neurosurgery did not think surgical intervention was indicated.  For headaches, he is taking gabapentin 100mg  at bedtime.  They are well-controlled.  Over the past month, he has noted pain in the left wrist and hand.  He describes it as a burning pain.  At first, it only involved his wrist but has since radiated to his thumb.  It only occurs sometimes when he is sitting in a position where his wrist is flexed.  He notes some tingling sensation but no numbness.  He denies weakness.  He has worn a wrist brace, which helps.  He reports no significant neck pain.  He developed shingles along his left shoulder 2 weeks ago.  CMP and CBC from April were unremarkable.  UPDATE: For about almost 2 years, he has been experiencing daily headache.  They are squeezing pain in a band-like distribution.  They are usually 5-6/10 but may be higher.  They usually last 2 hours and occur 2 to 3 times a day.  He notes some blurred vision.  Rarely, he has noted nausea.  He denies photophobia or phonophobia.  Singing in choir makes it worse.  He has been taking Tylenol daily.  In addition, the headache is sometimes accompanied by dizziness, which he describes as feeling "light in the head" rather than spinning sensation.  For about 2 years, he has had frequent falls.  He has right ankle instability, for which he wears a brace.  However, he feels the falls  are due to inability to keep balance.  He does not some neck pain and hears his neck crunch when he turns his head.  He reports remote history of headaches in the 1970s.  MRA of head and neck performed on 03/12/15 showed small segment of signal dropout in the distal left V1 segment, correlating with artifact, but no major branch vessel stenosis or occlusion.  PAST MEDICAL HISTORY: Past Medical History  Diagnosis Date  . Depression   . GERD (gastroesophageal reflux disease)   . Hyperlipidemia   . Hypertension   . Deep venous thrombosis ([place]) 1997    Postop  . Colon polyps   . Shoulder fracture, left 02/23/2011    immobilized in sling , Dr Norris(no surgery)  . Cancer [facility]) 2004    prostate cancer, Dr. Risa Grill    MEDICATIONS: Current Outpatient Prescriptions on File Prior to Visit  Medication Sig Dispense Refill  . acetaminophen (TYLENOL) 325 MG tablet Take 650 mg by mouth every 6 (six) hours as needed for pain. Pain    . aspirin EC 81 MG tablet Take 1 tablet (81 mg total) by mouth daily.    [name] gabapentin (NEURONTIN) 100 MG capsule Take 1 capsule (100 mg total) by mouth at bedtime. 90 capsule 3  . Omega-3 Fatty Acids (FISH OIL) 1000 MG CAPS Take by mouth.    [name] omeprazole (PRILOSEC) 20 MG capsule Take 1 capsule (20 mg total) by  mouth daily. 90 capsule 3  . simvastatin (ZOCOR) 40 MG tablet TAKE 1 TABLET BY MOUTH EVERY DAY 90 tablet 2  . triamcinolone cream (KENALOG) 0.1 % Apply 1 application topically 2 (two) times daily as needed. Pain or itching 30 g 0  . valACYclovir (VALTREX) 1000 MG tablet Take 1 tablet (1,000 mg total) by mouth 3 (three) times daily. 21 tablet 0  . vitamin C (ASCORBIC ACID) 250 MG tablet Take 250 mg by mouth daily.     No current facility-administered medications on file prior to visit.    ALLERGIES: Allergies  Allergen Reactions  . Chicken Allergy Nausea And Vomiting    Digestive problems    FAMILY HISTORY: Family History  Problem Relation Age of Onset    . Prostate cancer Father   . Testicular cancer Son     SOCIAL HISTORY: Social History   Social History  . Marital Status: Widowed    Spouse Name: N/A  . Number of Children: N/A  . Years of Education: N/A   Occupational History  . Not on file.   Social History Main Topics  . Smoking status: Former Smoker -- 0.50 packs/day for 5 years    Types: Cigarettes    Quit date: 03/13/1988  . Smokeless tobacco: Not on file     Comment: Quit at about age 14 / chewed tobacca x 1 year  . Alcohol Use: No  . Drug Use: No  . Sexual Activity: Not on file   Other Topics Concern  . Not on file   Social History Narrative   Lives alone; Wife passed 12-11-12;   Died of ovarian cancer; breast cancer; other cancer;          REVIEW OF SYSTEMS: Constitutional: No fevers, chills, or sweats, no generalized fatigue, change in appetite Eyes: No visual changes, double vision, eye pain Ear, nose and throat: No hearing loss, ear pain, nasal congestion, sore throat Cardiovascular: No chest pain, palpitations Respiratory:  No shortness of breath at rest or with exertion, wheezes GastrointestinaI: No nausea, vomiting, diarrhea, abdominal pain, fecal incontinence Genitourinary:  No dysuria, urinary retention or frequency Musculoskeletal:  No neck pain, back pain Integumentary: Shingles Neurological: as above Psychiatric: No depression, insomnia, anxiety Endocrine: No palpitations, fatigue, diaphoresis, mood swings, change in appetite, change in weight, increased thirst Hematologic/Lymphatic:  No purpura, petechiae. Allergic/Immunologic: no itchy/runny eyes, nasal congestion, recent allergic reactions, rashes  PHYSICAL EXAM: Filed Vitals:   08/19/15 1509  BP: 144/60  Pulse: 89   General: No acute distress.  Patient appears well-groomed.  normal body habitus. Head:  Normocephalic/atraumatic Eyes:  Fundi examined but not visualized Neck: supple, no paraspinal tenderness, full range of  motion Heart:  Regular rate and rhythm Lungs:  Clear to auscultation bilaterally Back: No paraspinal tenderness Neurological Exam: alert and oriented to person, place, and time. Attention span and concentration intact, recent and remote memory intact, fund of knowledge intact.  Speech fluent and not dysarthric, language intact.  CN II-XII intact. Bulk and tone normal, muscle strength 5/5 throughout.  Reduced pinprick sensation involving the dorsal and median aspect of his left wrist.  Vibration intact.  Deep tendon reflexes 1+ throughout.  Finger to nose  testing intact.  Gait normal  IMPRESSION: Tension-type headache, resolved Probable left carpal tunnel syndrome HTN  PLAN: 1.  Set up for NCV-EMG of left upper extremitiy 2.  Continue wrist brace 3.  BP slightly elevated.  Follow up with PCP. 4.  Taking gabapentin 100mg  at  bedtime for tension-type headache  Metta Clines, DO  CC:  Celso Amy, MD

## 2015-08-19 NOTE — Patient Instructions (Signed)
1.  Will get a nerve study of the left hand to assess for carpal tunnel syndrome.  Pending results, I would likely refer you to a hand specialist.  2.  In meantime, wear the wrist brace.

## 2015-09-07 ENCOUNTER — Ambulatory Visit (INDEPENDENT_AMBULATORY_CARE_PROVIDER_SITE_OTHER): Payer: Medicare Other | Admitting: Neurology

## 2015-09-07 DIAGNOSIS — G5602 Carpal tunnel syndrome, left upper limb: Secondary | ICD-10-CM

## 2015-09-07 DIAGNOSIS — G629 Polyneuropathy, unspecified: Secondary | ICD-10-CM

## 2015-09-07 NOTE — Procedures (Signed)
[facility]  [place], [address]  [place], [place] [address] Tel: [phone] Fax:  [phone] Test Date:  09/07/2015  Patient: Ronald Stafford DOB: 1931-03-06 Physician: Narda Amber, DO  Sex: Male Height: 6\' 2"  Ref Phys: Metta Clines  ID#: [id number] Temp: 32.6C Technician: Jerilynn Mages. Dean   Patient Complaints: This is a 82 year old gentleman referred for evaluation of left wrist pain and paresthesias.  NCV & EMG Findings: Extensive electrodiagnostic testing of the left upper extremity and additional studies of the right shows: 1. Bilateral median sensory responses show prolonged latency and reduced amplitude. Bilateral radial sensory nerves showed reduced amplitude. Left ulnar sensory response is prolonged with normal amplitude. The right ulnar sensory response is absent. 2. Left median motor response shows reduced amplitude. There is evidence of anomalous innervation to the abductor pollicis brevis as noted by a motor response when stimulating at the ulnar wrist, consistent with a Martin-Gruber anastomosis. Left ulnar motor responses within normal limits. 3. Chronic motor axon loss changes are seen affecting the left abductor pollicis brevis and extensor indicis proprius muscles. There is no evidence of accompanied active denervation.  Impression: 1. The electrophysiologic findings are most consistent with a chronic and symmetric sensorimotor polyneuropathy, predominantly axon loss in type, affecting the upper extremities. 2. Incidentally, there is a left Martin-Gruber anastomosis, a normal variant.   ___________________________ Narda Amber, DO    Nerve Conduction Studies Anti Sensory Summary Table   Site NR Peak (ms) Norm Peak (ms) P-T Amp (V) Norm P-T Amp  Left Median Anti Sensory (2nd Digit)  32.6C  Wrist    4.1 <3.8 8.8 >10  Right Median Anti Sensory (2nd Digit)  32.6C  Wrist    4.5 <3.8 5.8 >10  Left Radial Anti Sensory (Base 1st Digit)  32.6C  Wrist    2.7  <2.8 8.2 >10  Right Radial Anti Sensory (Base 1st Digit)  32.6C  Wrist    2.8 <2.8 6.6 >10  Left Ulnar Anti Sensory (5th Digit)  32.6C  Wrist    4.0 <3.2 5.5 >5  Right Ulnar Anti Sensory (5th Digit)  32.6C  Wrist NR  <3.2  >5   Motor Summary Table   Site NR Onset (ms) Norm Onset (ms) O-P Amp (mV) Norm O-P Amp Site1 Site2 Delta-0 (ms) Dist (cm) Vel (m/s) Norm Vel (m/s)  Left Median Motor (Abd Poll Brev)  32.6C  Wrist    3.8 <4.0 4.4 >5 Elbow Wrist 6.4 32.0 50 >50  Elbow    10.2  4.1  Ulnar wrist -crossover Elbow 5.4 0.0    Ulnar wrist -crossover    4.8  0.6         Left Ulnar Motor (Abd Dig Minimi)  32.6C  Wrist    2.9 <3.1 9.6 >7 B Elbow Wrist 4.8 24.0 50 >50  B Elbow    7.7  8.2  A Elbow B Elbow 1.9 10.0 53 >50  A Elbow    9.6  7.7          EMG   Side Muscle Ins Act Fibs Psw Fasc Number Recrt Dur Dur. Amp Amp. Poly Poly. Comment  Left 1stDorInt Nml Nml Nml Nml Nml Nml Nml Nml Nml Nml Nml Nml N/A  Left Abd Poll Brev Nml Nml Nml Nml 1- Rapid Few 1+ Few 1+ Nml Nml N/A  Left Ext Indicis Nml Nml Nml Nml 1- Rapid Few 1+ Few 1+ Nml Nml N/A  Left PronatorTeres Nml Nml Nml Nml Nml Nml Nml  Nml Nml Nml Nml Nml N/A  Left Biceps Nml Nml Nml Nml Nml Nml Nml Nml Nml Nml Nml Nml N/A  Left Triceps Nml Nml Nml Nml Nml Nml Nml Nml Nml Nml Nml Nml N/A  Left Deltoid Nml Nml Nml Nml Nml Nml Nml Nml Nml Nml Nml Nml N/A      Waveforms:

## 2015-09-09 ENCOUNTER — Telehealth: Payer: Self-pay

## 2015-09-09 DIAGNOSIS — G609 Hereditary and idiopathic neuropathy, unspecified: Secondary | ICD-10-CM

## 2015-09-09 NOTE — Telephone Encounter (Signed)
Left message for pt to return call for results.

## 2015-09-09 NOTE — Telephone Encounter (Signed)
Pt aware. Order placed. Spoke with [facility] ([address] [place], [place] [address] ph: [phone] fax: [phone]) they will call and schedule directly with patient as this will require coordination with one of their provider's schedules.

## 2015-09-09 NOTE — Telephone Encounter (Signed)
-----   Message from Pieter Partridge, DO sent at 09/08/2015 12:17 PM EDT ----- Nerve study showed evidence of neuropathy but not specifically carpal tunnel, which was what I expected.  I still suspect it is carpal tunnel based on symptoms and positive response with wrist splint.  I would like to get an ultrasound of the left median nerve to look for any evidence of nerve compression.

## 2015-09-20 ENCOUNTER — Telehealth: Payer: Self-pay

## 2015-09-20 NOTE — Telephone Encounter (Signed)
Spoke with patient. He will call [facility] ([address] [place], [place] [address] ph: [phone] fax: [phone]).

## 2015-09-20 NOTE — Telephone Encounter (Signed)
Pt left vm complaining of arm pain, and unsure what we were going to do about it. Attempted to call patient on both numbers listed to see if [facility] ([address] [place], [place] [address] ph: [phone] fax: [phone])  had contacted him yet to set up ultrasound, and to see if he was still wearing his splint. Both numbers continuously rang with no answer. Will call back after lunch, if I do not hear from patient.

## 2015-10-05 ENCOUNTER — Ambulatory Visit
Admission: RE | Admit: 2015-10-05 | Discharge: 2015-10-05 | Disposition: A | Discharge status: Home / Self Care | Payer: Medicare Other | Source: Ambulatory Visit | Attending: Neurology | Admitting: Neurology

## 2015-10-05 DIAGNOSIS — G609 Hereditary and idiopathic neuropathy, unspecified: Secondary | ICD-10-CM

## 2015-10-06 ENCOUNTER — Telehealth: Payer: Self-pay

## 2015-10-06 MED ORDER — METHYLPREDNISOLONE 4 MG PO TBPK: ORAL_TABLET | ORAL | 0 refills | Status: DC

## 2015-10-06 NOTE — Telephone Encounter (Signed)
-----   Message from Pieter Partridge, DO sent at 10/06/2015  8:51 AM EDT ----- Ultrasound is normal.  I think the thumb and wrist pain is due to a tendonitis rather than a nerve issue (De Quervain's tenosynovitis).  We can give him a Medrol Dosepak to reduce inflammation.  I would like to refer him to [facility] for evaluation and treatment.

## 2015-10-06 NOTE — Telephone Encounter (Signed)
Pt agreeable to medication. Will call back if he decides to pursue referral for Dr. Tamala Julian.

## 2015-12-17 ENCOUNTER — Ambulatory Visit (INDEPENDENT_AMBULATORY_CARE_PROVIDER_SITE_OTHER): Payer: Medicare Other | Admitting: Internal Medicine

## 2015-12-17 VITALS — BP 152/66 | HR 80 | Temp 97.6°F | Resp 16 | Wt 185.0 lb

## 2015-12-17 DIAGNOSIS — M25371 Other instability, right ankle: Secondary | ICD-10-CM

## 2015-12-17 DIAGNOSIS — M48061 Spinal stenosis, lumbar region without neurogenic claudication: Secondary | ICD-10-CM | POA: Diagnosis not present

## 2015-12-17 DIAGNOSIS — I10 Essential (primary) hypertension: Secondary | ICD-10-CM

## 2015-12-17 DIAGNOSIS — M25532 Pain in left wrist: Secondary | ICD-10-CM

## 2015-12-17 DIAGNOSIS — Z23 Encounter for immunization: Secondary | ICD-10-CM

## 2015-12-17 MED ORDER — SIMVASTATIN 40 MG PO TABS: 40.0000 mg | ORAL_TABLET | Freq: Every day | ORAL | 3 refills | Status: DC

## 2015-12-17 NOTE — Progress Notes (Signed)
Pre visit review using our clinic review tool, if applicable. No additional management support is needed unless otherwise documented below in the visit note. 

## 2015-12-17 NOTE — Progress Notes (Signed)
Subjective:    Patient ID: Ronald Stafford, male    DOB: [birth year], 82 y.o.   MRN: [id number]  HPI He is here for follow up.  Foot pain:  His right foot was inverting when he walked and it was painful.  He saw Dr Lissa Merlin and was advised to wear brace.  He felt it was related to nerve damage in his back and if wearing it helped he could avoid surgery.  He feels it does help and does not mild wearing it.     Left wrist pain:  He saw dr Lissa Merlin for this as well.  He thought it was arthritis and carpal tunnel syndrome.  He has been wearing a brace and it helps.      Back pain:  He gets back pain on occasion.  It is not anything that bothers him enough that he feels he needs to do something about it.    Medications and allergies reviewed with patient and updated if appropriate.  Patient Active Problem List   Diagnosis Date Noted  . Left wrist pain 08/11/2015  . Frequent headaches 06/22/2015  . Prediabetes 02/11/2015  . Episodic paroxysmal hemicrania, not intractable 02/11/2015  . Occult blood positive stool 09/27/2014  . CAP (community acquired pneumonia) 09/25/2014  . Peroneal tendonitis of right lower extremity 01/12/2014  . Mass of right ankle 12/31/2013  . Right ankle instability 12/31/2013  . Radial head fracture, closed 09/18/2013  . Lateral epicondylitis of right elbow 09/16/2013  . Solitary pulmonary nodule 08/08/2012  . FATIGUE 05/27/2010  . Hypoxemia 05/23/2010  . SPINAL STENOSIS, LUMBAR 04/06/2010  . LOW BACK PAIN SYNDROME 11/02/2009  . FOOT DROP, RIGHT 05/21/2008  . Other nonthrombocytopenic purpuras 10/21/2007  . Hyperlipidemia 12/25/2006  . DEPRESSION 12/25/2006  . Essential hypertension 12/25/2006  . GERD 12/25/2006  . PROSTATE CANCER, HX OF 12/25/2006  . POLYP, COLON 05/02/2006    Current Outpatient Prescriptions on File Prior to Visit  Medication Sig Dispense Refill  . acetaminophen (TYLENOL) 325 MG tablet Take 650 mg by mouth every 6 (six) hours as needed for  pain. Pain    . aspirin EC 81 MG tablet Take 1 tablet (81 mg total) by mouth daily.    [name] gabapentin (NEURONTIN) 100 MG capsule Take 1 capsule (100 mg total) by mouth at bedtime. 90 capsule 3  . Omega-3 Fatty Acids (FISH OIL) 1000 MG CAPS Take by mouth.    [name] omeprazole (PRILOSEC) 20 MG capsule Take 1 capsule (20 mg total) by mouth daily. 90 capsule 3  . simvastatin (ZOCOR) 40 MG tablet TAKE 1 TABLET BY MOUTH EVERY DAY 90 tablet 2  . triamcinolone cream (KENALOG) 0.1 % Apply 1 application topically 2 (two) times daily as needed. Pain or itching 30 g 0  . vitamin C (ASCORBIC ACID) 250 MG tablet Take 250 mg by mouth daily.     No current facility-administered medications on file prior to visit.     Past Medical History:  Diagnosis Date  . Cancer [facility]) 2004   prostate cancer, Dr. Risa Grill  . Colon polyps   . Deep venous thrombosis ([place]) 1997   Postop  . Depression   . GERD (gastroesophageal reflux disease)   . Hyperlipidemia   . Hypertension   . Shoulder fracture, left 02/23/2011   immobilized in sling , Dr Norris(no surgery)    Past Surgical History:  Procedure Laterality Date  . COLONOSCOPY  2006   Negative,Dr.Edwards  . colonoscopy with polypectomy  PMH of  . JOINT REPLACEMENT  1997, 2001   Total Hip replacement X 2  . LUMBAR SPINE SURGERY  09/28/2010   Dr Rolena Infante; for spinal stenosis  . MECKEL DIVERTICULUM EXCISION     Age 11  . PROSTATECTOMY  2004   w/ radiation, Dr Risa Grill  . VASECTOMY      Social History   Social History  . Marital status: Widowed    Spouse name: N/A  . Number of children: N/A  . Years of education: N/A   Social History Main Topics  . Smoking status: Former Smoker    Packs/day: 0.50    Years: 5.00    Types: Cigarettes    Quit date: 03/13/1988  . Smokeless tobacco: Not on file     Comment: Quit at about age 75 / chewed tobacca x 1 year  . Alcohol use No  . Drug use: No  . Sexual activity: Not on file   Other Topics Concern  . Not on  file   Social History Narrative   Lives alone; Wife passed Dec 20, 2012;   Died of ovarian cancer; breast cancer; other cancer;          Family History  Problem Relation Age of Onset  . Prostate cancer Father   . Testicular cancer Son     Review of Systems  Constitutional: Negative for chills and fever.  Musculoskeletal: Positive for arthralgias (left wrist), back pain and gait problem (related to right ankle ). Negative for myalgias.  Neurological: Positive for weakness. Negative for numbness.       Objective:   Vitals:   12/17/15 1539  BP: (!) 152/66  Pulse: 80  Resp: 16  Temp: 97.6 F (36.4 C)   Filed Weights   12/17/15 1539  Weight: 185 lb (83.9 kg)   Body mass index is 23.75 kg/m.   Physical Exam  Constitutional: He appears well-developed and well-nourished. No distress.  Musculoskeletal: He exhibits no edema.  No right ankle or achilles tenderness, slight weakness in right ankle against resistance, no lower back pain with palpation, left wrist in brace  Neurological: He exhibits normal muscle tone.  Skin: He is not diaphoretic.          Assessment & Plan:   Flu shot today  See Problem List for Assessment and Plan of chronic medical problems.

## 2015-12-17 NOTE — Patient Instructions (Addendum)
   Flu vaccine administered today.   Medications reviewed and updated.  No changes recommended at this time.  Your prescription(s) have been submitted to your pharmacy. Please take as directed and contact our office if you believe you are having problem(s) with the medication(s).

## 2015-12-18 ENCOUNTER — Encounter: Payer: Self-pay | Admitting: Internal Medicine

## 2015-12-18 NOTE — Assessment & Plan Note (Signed)
Symptoms improved with brace Continue brace as needed

## 2015-12-18 NOTE — Assessment & Plan Note (Signed)
Foot inversion with walking likely related to radicular symptoms from back Wearing a brace daily and doing well Continue same

## 2015-12-18 NOTE — Assessment & Plan Note (Signed)
BP Readings from Last 3 Encounters:  12/17/15 (!) 152/66  08/19/15 (!) 144/60  08/16/15 162/83   Slightly elevated today Currently not on medication Continue to monitor

## 2015-12-18 NOTE — Assessment & Plan Note (Signed)
Has frequent back pain/discomfort Tolerable  No additional treatment needed

## 2015-12-23 ENCOUNTER — Ambulatory Visit: Payer: Medicare Other | Admitting: Internal Medicine

## 2016-01-26 ENCOUNTER — Ambulatory Visit (INDEPENDENT_AMBULATORY_CARE_PROVIDER_SITE_OTHER): Payer: Medicare Other | Admitting: Internal Medicine

## 2016-01-26 ENCOUNTER — Encounter: Payer: Self-pay | Admitting: Internal Medicine

## 2016-01-26 DIAGNOSIS — R059 Cough, unspecified: Secondary | ICD-10-CM | POA: Insufficient documentation

## 2016-01-26 DIAGNOSIS — R05 Cough: Secondary | ICD-10-CM | POA: Diagnosis not present

## 2016-01-26 MED ORDER — PREDNISONE 20 MG PO TABS: 40.0000 mg | ORAL_TABLET | Freq: Every day | ORAL | 0 refills | Status: DC

## 2016-01-26 NOTE — Progress Notes (Signed)
   Subjective:    Patient ID: Ronald Stafford, male    DOB: September 17, 1930, 82 y.o.   MRN: [id number]  HPI The patient is an 82 YO man coming in for cold symptoms for 3 days. He is currently renovating his house and was scraping some popcorn ceilings which he feels he may have inhaled some. Lots of nasal drainage since that time and some cough with white sputum. Some SOB with nose blocked. Not able to do as much with the renovation. No fevers or chills. Taking his nose spray twice as often and it is helping a little. No ear pain or hearing change.   Review of Systems  Constitutional: Positive for activity change. Negative for appetite change, chills, fatigue and fever.  HENT: Positive for congestion, postnasal drip, rhinorrhea and sore throat. Negative for ear discharge, ear pain, sinus pain, sinus pressure and trouble swallowing.   Eyes: Negative.   Respiratory: Positive for cough and shortness of breath. Negative for chest tightness and wheezing.   Cardiovascular: Negative.   Gastrointestinal: Negative.   Musculoskeletal: Negative.   Neurological: Negative.       Objective:   Physical Exam  Constitutional: He appears well-developed and well-nourished.  HENT:  Head: Normocephalic and atraumatic.  Right Ear: External ear normal.  Left Ear: External ear normal.  Oropharynx with redness and clear to white drainage  Eyes: EOM are normal.  Neck: Normal range of motion.  Cardiovascular: Normal rate and regular rhythm.   Pulmonary/Chest: Effort normal. No respiratory distress. He has wheezes. He has no rales. He exhibits no tenderness.  Mild coarse rhonchi which partially clear with cough  Abdominal: Soft.  Skin: Skin is warm and dry.   Vitals:   01/26/16 0847  BP: (!) 152/70  Pulse: 80  Resp: 18  Temp: 97.8 F (36.6 C)  TempSrc: Oral  SpO2: 94%  Weight: 183 lb (83 kg)  Height: 6\' 2"  (1.88 m)      Assessment & Plan:

## 2016-01-26 NOTE — Assessment & Plan Note (Signed)
He did have some mild mold exposure and particles in the air. Rx for prednisone for the likely chemical irritation in the lungs and sinuses.

## 2016-01-26 NOTE — Progress Notes (Signed)
Pre visit review using our clinic review tool, if applicable. No additional management support is needed unless otherwise documented below in the visit note. 

## 2016-01-26 NOTE — Patient Instructions (Signed)
We have sent in prednisone for the allergies to help them get better quicker. Take 2 pills daily for 5 days.

## 2016-04-04 ENCOUNTER — Ambulatory Visit (INDEPENDENT_AMBULATORY_CARE_PROVIDER_SITE_OTHER): Payer: Medicare Other | Admitting: Internal Medicine

## 2016-04-04 ENCOUNTER — Encounter: Payer: Self-pay | Admitting: Internal Medicine

## 2016-04-04 VITALS — BP 152/68 | HR 96 | Temp 98.5°F | Resp 16 | Ht 74.0 in | Wt 186.1 lb

## 2016-04-04 DIAGNOSIS — I10 Essential (primary) hypertension: Secondary | ICD-10-CM

## 2016-04-04 DIAGNOSIS — Z23 Encounter for immunization: Secondary | ICD-10-CM

## 2016-04-04 NOTE — Progress Notes (Signed)
Subjective:  Patient ID: Ronald Stafford, male    DOB: Dec 05, 1930  Age: 83 y.o. MRN: [id number]  CC: Hypertension   HPI [name] presents for A blood pressure check. He feels well and offers no complaints. He knows his blood pressure is slightly elevated but he doesn't want to take an antihypertensive. He has had no recent episodes of headache/blurred vision/chest pain/shortness of breath/palpitations/edema/fatigue.  Outpatient Medications Prior to Visit  Medication Sig Dispense Refill  . acetaminophen (TYLENOL) 325 MG tablet Take 650 mg by mouth every 6 (six) hours as needed for pain. Pain    . gabapentin (NEURONTIN) 100 MG capsule Take 1 capsule (100 mg total) by mouth at bedtime. 90 capsule 3  . Omega-3 Fatty Acids (FISH OIL) 1000 MG CAPS Take by mouth.    [name] omeprazole (PRILOSEC) 20 MG capsule Take 1 capsule (20 mg total) by mouth daily. 90 capsule 3  . predniSONE (DELTASONE) 20 MG tablet Take 2 tablets (40 mg total) by mouth daily with breakfast. 10 tablet 0  . vitamin C (ASCORBIC ACID) 250 MG tablet Take 250 mg by mouth daily.    [name] aspirin EC 81 MG tablet Take 1 tablet (81 mg total) by mouth daily. (Patient not taking: Reported on 04/04/2016)    . simvastatin (ZOCOR) 40 MG tablet Take 1 tablet (40 mg total) by mouth daily. (Patient not taking: Reported on 04/04/2016) 90 tablet 3  . triamcinolone cream (KENALOG) 0.1 % Apply 1 application topically 2 (two) times daily as needed. Pain or itching (Patient not taking: Reported on 01/26/2016) 30 g 0   No facility-administered medications prior to visit.     ROS Review of Systems  Constitutional: Negative for appetite change, diaphoresis, fatigue and unexpected weight change.  HENT: Negative.   Eyes: Negative for visual disturbance.  Respiratory: Negative for apnea, cough, chest tightness, shortness of breath and wheezing.   Cardiovascular: Negative for chest pain, palpitations and leg swelling.  Gastrointestinal: Negative for abdominal  pain, constipation, diarrhea, nausea and vomiting.  Endocrine: Negative.   Genitourinary: Negative for difficulty urinating, dysuria and hematuria.  Musculoskeletal: Negative.  Negative for back pain and neck pain.  Skin: Negative.   Allergic/Immunologic: Negative.   Neurological: Negative.  Negative for dizziness.  Hematological: Negative.  Negative for adenopathy. Does not bruise/bleed easily.  Psychiatric/Behavioral: Negative.     Objective:  BP (!) 152/68 (BP Location: Left Arm, Patient Position: Sitting, Cuff Size: Normal)   Pulse 96   Temp 98.5 F (36.9 C) (Oral)   Resp 16   Ht 6\' 2"  (1.88 m)   Wt 186 lb 1.9 oz (84.4 kg)   SpO2 98%   BMI 23.90 kg/m   BP Readings from Last 3 Encounters:  04/04/16 (!) 152/68  01/26/16 (!) 158/88  12/17/15 (!) 152/66    Wt Readings from Last 3 Encounters:  04/04/16 186 lb 1.9 oz (84.4 kg)  01/26/16 183 lb (83 kg)  12/17/15 185 lb (83.9 kg)    Physical Exam  Constitutional: He is oriented to person, place, and time. No distress.  HENT:  Mouth/Throat: Oropharynx is clear and moist. No oropharyngeal exudate.  Eyes: Conjunctivae are normal. Right eye exhibits no discharge. Left eye exhibits no discharge. No scleral icterus.  Neck: Normal range of motion. Neck supple. No JVD present. No tracheal deviation present. No thyromegaly present.  Cardiovascular: Normal rate, regular rhythm, normal heart sounds and intact distal pulses.  Exam reveals no gallop and no friction rub.   No  murmur heard. EKG ---  Sinus  Rhythm  -  Nonspecific T-abnormality.   ABNORMAL  Pulmonary/Chest: Effort normal and breath sounds normal. No stridor. No respiratory distress. He has no wheezes. He has no rales. He exhibits no tenderness.  Abdominal: Soft. Bowel sounds are normal. He exhibits no distension and no mass. There is no tenderness. There is no rebound and no guarding.  Musculoskeletal: Normal range of motion. He exhibits no edema, tenderness or  deformity.  Lymphadenopathy:    He has no cervical adenopathy.  Neurological: He is oriented to person, place, and time.  Skin: Skin is warm and dry. No rash noted. He is not diaphoretic. No erythema. No pallor.  Vitals reviewed.   Lab Results  Component Value Date   WBC 5.8 06/22/2015   HGB 15.1 06/22/2015   HCT 44.1 06/22/2015   PLT 131.0 (L) 06/22/2015   GLUCOSE 82 06/22/2015   CHOL 193 06/22/2015   TRIG 120.0 06/22/2015   HDL 43.90 06/22/2015   LDLCALC 125 (H) 06/22/2015   ALT 22 06/22/2015   AST 30 06/22/2015   NA 143 06/22/2015   K 4.1 06/22/2015   CL 106 06/22/2015   CREATININE 0.99 06/22/2015   BUN 16 06/22/2015   CO2 30 06/22/2015   TSH 2.18 06/22/2015   INR 1.05 09/27/2014   HGBA1C 5.8 06/22/2015    [place] Extrem Up Left Ltd  Result Date: 10/06/2015 CLINICAL DATA:  Hereditary and idiopathic peripheral neuropathy. Volar left wrist pain. EXAM: ULTRASOUND LEFT UPPER EXTREMITY LIMITED TECHNIQUE: Ultrasound examination of the upper extremity soft tissues was performed in the area of clinical concern. COMPARISON:  Opposite wrist for comparison FINDINGS: The course of the left median nerve was traced from the proximal forearm into the carpal tunnel. The nerve appears normal throughout the visualized course with no evidence of a mass or entrapment. The appearance of the opposite median nerve was essentially the same. No visible impingement within the carpal tunnel. IMPRESSION: Normal ultrasound of the left median nerve from the proximal forearm into the carpal tunnel. Electronically Signed   By: Lorriane Shire M.D.   On: 10/06/2015 07:38   Assessment & Plan:   Edoardo was seen today for hypertension.  Diagnoses and all orders for this visit:  Need for prophylactic vaccination with combined diphtheria-tetanus-pertussis (DTP) vaccine -     Tdap vaccine greater than or equal to 7yo IM  Essential hypertension- his EKG shows nonspecific changes but there is no evidence of LVH or  ischemia. His blood pressure is adequately well controlled considering his age. He agrees to continue to work on his lifestyle modifications. -     EKG 12-Lead   I have discontinued Mr. Graw's triamcinolone cream. I am also having him maintain his acetaminophen, vitamin C, aspirin EC, Fish Oil, omeprazole, gabapentin, simvastatin, and predniSONE.  No orders of the defined types were placed in this encounter.    Follow-up: Return in about 3 months (around 07/03/2016).  Scarlette Calico, MD

## 2016-04-04 NOTE — Patient Instructions (Signed)
Hypertension Hypertension, commonly called high blood pressure, is when the force of blood pumping through your arteries is too strong. Your arteries are the blood vessels that carry blood from your heart throughout your body. A blood pressure reading consists of a higher number over a lower number, such as 110/72. The higher number (systolic) is the pressure inside your arteries when your heart pumps. The lower number (diastolic) is the pressure inside your arteries when your heart relaxes. Ideally you want your blood pressure below 120/80. Hypertension forces your heart to work harder to pump blood. Your arteries may become narrow or stiff. Having untreated or uncontrolled hypertension can cause heart attack, stroke, kidney disease, and other problems. What increases the risk? Some risk factors for high blood pressure are controllable. Others are not. Risk factors you cannot control include:  Race. You may be at higher risk if you are African American.  Age. Risk increases with age.  Gender. Men are at higher risk than women before age 45 years. After age 65, women are at higher risk than men. Risk factors you can control include:  Not getting enough exercise or physical activity.  Being overweight.  Getting too much fat, sugar, calories, or salt in your diet.  Drinking too much alcohol. What are the signs or symptoms? Hypertension does not usually cause signs or symptoms. Extremely high blood pressure (hypertensive crisis) may cause headache, anxiety, shortness of breath, and nosebleed. How is this diagnosed? To check if you have hypertension, your health care provider will measure your blood pressure while you are seated, with your arm held at the level of your heart. It should be measured at least twice using the same arm. Certain conditions can cause a difference in blood pressure between your right and left arms. A blood pressure reading that is higher than normal on one occasion does  not mean that you need treatment. If it is not clear whether you have high blood pressure, you may be asked to return on a different day to have your blood pressure checked again. Or, you may be asked to monitor your blood pressure at home for 1 or more weeks. How is this treated? Treating high blood pressure includes making lifestyle changes and possibly taking medicine. Living a healthy lifestyle can help lower high blood pressure. You may need to change some of your habits. Lifestyle changes may include:  Following the DASH diet. This diet is high in fruits, vegetables, and whole grains. It is low in salt, red meat, and added sugars.  Keep your sodium intake below 2,300 mg per day.  Getting at least 30-45 minutes of aerobic exercise at least 4 times per week.  Losing weight if necessary.  Not smoking.  Limiting alcoholic beverages.  Learning ways to reduce stress. Your health care provider may prescribe medicine if lifestyle changes are not enough to get your blood pressure under control, and if one of the following is true:  You are 18-59 years of age and your systolic blood pressure is above 140.  You are 60 years of age or older, and your systolic blood pressure is above 150.  Your diastolic blood pressure is above 90.  You have diabetes, and your systolic blood pressure is over 140 or your diastolic blood pressure is over 90.  You have kidney disease and your blood pressure is above 140/90.  You have heart disease and your blood pressure is above 140/90. Your personal target blood pressure may vary depending on your medical   conditions, your age, and other factors. Follow these instructions at home:  Have your blood pressure rechecked as directed by your health care provider.  Take medicines only as directed by your health care provider. Follow the directions carefully. Blood pressure medicines must be taken as prescribed. The medicine does not work as well when you skip  doses. Skipping doses also puts you at risk for problems.  Do not smoke.  Monitor your blood pressure at home as directed by your health care provider. Contact a health care provider if:  You think you are having a reaction to medicines taken.  You have recurrent headaches or feel dizzy.  You have swelling in your ankles.  You have trouble with your vision. Get help right away if:  You develop a severe headache or confusion.  You have unusual weakness, numbness, or feel faint.  You have severe chest or abdominal pain.  You vomit repeatedly.  You have trouble breathing. This information is not intended to replace advice given to you by your health care provider. Make sure you discuss any questions you have with your health care provider. Document Released: 02/27/2005 Document Revised: 08/05/2015 Document Reviewed: 12/20/2012 Elsevier Interactive Patient Education  2017 [employer]  

## 2016-04-04 NOTE — Progress Notes (Signed)
Pre visit review using our clinic review tool, if applicable. No additional management support is needed unless otherwise documented below in the visit note. 

## 2016-04-18 ENCOUNTER — Ambulatory Visit: Payer: Medicare Other | Admitting: Internal Medicine

## 2016-06-27 ENCOUNTER — Encounter: Payer: Self-pay | Admitting: Internal Medicine

## 2016-06-27 ENCOUNTER — Other Ambulatory Visit (INDEPENDENT_AMBULATORY_CARE_PROVIDER_SITE_OTHER): Payer: Medicare Other

## 2016-06-27 ENCOUNTER — Ambulatory Visit (INDEPENDENT_AMBULATORY_CARE_PROVIDER_SITE_OTHER): Payer: Medicare Other | Admitting: Internal Medicine

## 2016-06-27 VITALS — BP 156/80 | HR 78 | Temp 97.7°F | Resp 16 | Wt 186.0 lb

## 2016-06-27 DIAGNOSIS — R4781 Slurred speech: Secondary | ICD-10-CM | POA: Diagnosis not present

## 2016-06-27 DIAGNOSIS — R531 Weakness: Secondary | ICD-10-CM

## 2016-06-27 DIAGNOSIS — H538 Other visual disturbances: Secondary | ICD-10-CM

## 2016-06-27 DIAGNOSIS — E7800 Pure hypercholesterolemia, unspecified: Secondary | ICD-10-CM

## 2016-06-27 DIAGNOSIS — I10 Essential (primary) hypertension: Secondary | ICD-10-CM

## 2016-06-27 LAB — COMPREHENSIVE METABOLIC PANEL
ALT: 18 U/L (ref 0–53)
AST: 26 U/L (ref 0–37)
Albumin: 4.6 g/dL (ref 3.5–5.2)
Alkaline Phosphatase: 54 U/L (ref 39–117)
BILIRUBIN TOTAL: 1 mg/dL (ref 0.2–1.2)
BUN: 19 mg/dL (ref 6–23)
CALCIUM: 9.6 mg/dL (ref 8.4–10.5)
CHLORIDE: 104 meq/L (ref 96–112)
CO2: 30 meq/L (ref 19–32)
Creatinine, Ser: 0.92 mg/dL (ref 0.40–1.50)
GFR: 83.39 mL/min (ref >60.00)
Glucose, Bld: 109 mg/dL — ABNORMAL HIGH (ref 70–99)
Potassium: 4 mEq/L (ref 3.5–5.1)
Sodium: 140 mEq/L (ref 135–145)
Total Protein: 7.1 g/dL (ref 6.0–8.3)

## 2016-06-27 LAB — CBC WITH DIFFERENTIAL/PLATELET
Basophils Absolute: 0.1 10*3/uL (ref 0.0–0.1)
Basophils Relative: 3.1 % — ABNORMAL HIGH (ref 0.0–3.0)
Eosinophils Absolute: 0.1 10*3/uL (ref 0.0–0.7)
Eosinophils Relative: 1.6 % (ref 0.0–5.0)
HEMATOCRIT: 45.1 % (ref 39.0–52.0)
Hemoglobin: 15.1 g/dL (ref 13.0–17.0)
LYMPHS PCT: 23.9 % (ref 12.0–46.0)
Lymphs Abs: 1 10*3/uL (ref 0.7–4.0)
MCHC: 33.6 g/dL (ref 30.0–36.0)
MCV: 93.7 fl (ref 78.0–100.0)
MONOS PCT: 14.4 % — AB (ref 3.0–12.0)
Monocytes Absolute: 0.6 10*3/uL (ref 0.1–1.0)
NEUTROS ABS: 2.4 10*3/uL (ref 1.4–7.7)
Neutrophils Relative %: 57 % (ref 43.0–77.0)
PLATELETS: 114 10*3/uL — AB (ref 150.0–400.0)
RBC: 4.81 Mil/uL (ref 4.22–5.81)
RDW: 14.4 % (ref 11.5–15.5)
WBC: 4.2 10*3/uL (ref 4.0–10.5)

## 2016-06-27 LAB — TSH: TSH: 2.81 u[IU]/mL (ref 0.35–4.50)

## 2016-06-27 MED ORDER — ATORVASTATIN CALCIUM 40 MG PO TABS: 40.0000 mg | ORAL_TABLET | Freq: Every day | ORAL | 3 refills | Status: DC

## 2016-06-27 MED ORDER — CLOPIDOGREL BISULFATE 75 MG PO TABS: 75.0000 mg | ORAL_TABLET | Freq: Every day | ORAL | 3 refills | Status: DC

## 2016-06-27 MED ORDER — AMLODIPINE BESYLATE 5 MG PO TABS: 5.0000 mg | ORAL_TABLET | Freq: Every day | ORAL | 3 refills | Status: DC

## 2016-06-27 NOTE — Assessment & Plan Note (Signed)
Concern for recent stroke Stop ASA, start plavix Stop simvastatin, start lipitor MRI of brain [place] of carotids  Refer to neurology Symptoms have been improving

## 2016-06-27 NOTE — Progress Notes (Signed)
Subjective:    Patient ID: Ronald Stafford, male    DOB: 05-30-1930, 83 y.o.   MRN: [id number]  HPI He is here for an acute visit.   6 days ago he did not feel good when he woke up.  He had floaters.  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  He was not able to talk too good - speech slurred. He had numbness/tingling in his left arm, but no weakness in the arm.  His right side was weak.  He had difficulty eating dinner that night -- did not want anything to eat. He later states he has had some difficulty swallowing.  He felt lightheaded/dizzy. Most of those symptoms have resolved, but he just does not feel good.  He does feel like doing anything. He has still has chronic headaches - those are more often in the past week. His balance has been worse - he stumbles more and has been using a cane.  He still has right sided weakness - it is getting better.  He has some difficulty swallowing.    His vision and speech now are normal.    He denies numbness/tingling/weakness in his arms and legs.   He typically takes ASA 81 mg daily, but has missed a few days.   Medications and allergies reviewed with patient and updated if appropriate.  Patient Active Problem List   Diagnosis Date Noted  . Cough 01/26/2016  . Left wrist pain 08/11/2015  . Frequent headaches 06/22/2015  . Prediabetes 02/11/2015  . Episodic paroxysmal hemicrania, not intractable 02/11/2015  . Occult blood positive stool 09/27/2014  . CAP (community acquired pneumonia) 09/25/2014  . Peroneal tendonitis of right lower extremity 01/12/2014  . Mass of right ankle 12/31/2013  . Right ankle instability 12/31/2013  . Radial head fracture, closed 09/18/2013  . Lateral epicondylitis of right elbow 09/16/2013  . Solitary pulmonary nodule 08/08/2012  . SPINAL STENOSIS, LUMBAR 04/06/2010  . LOW BACK PAIN SYNDROME 11/02/2009  . FOOT DROP, RIGHT 05/21/2008  . Other nonthrombocytopenic purpuras 10/21/2007  .  Hyperlipidemia 12/25/2006  . DEPRESSION 12/25/2006  . Essential hypertension 12/25/2006  . GERD 12/25/2006  . PROSTATE CANCER, HX OF 12/25/2006  . POLYP, COLON 05/02/2006    Current Outpatient Prescriptions on File Prior to Visit  Medication Sig Dispense Refill  . acetaminophen (TYLENOL) 325 MG tablet Take 650 mg by mouth every 6 (six) hours as needed for pain. Pain    . aspirin EC 81 MG tablet Take 1 tablet (81 mg total) by mouth daily.    [name] gabapentin (NEURONTIN) 100 MG capsule Take 1 capsule (100 mg total) by mouth at bedtime. 90 capsule 3  . Omega-3 Fatty Acids (FISH OIL) 1000 MG CAPS Take by mouth.    [name] omeprazole (PRILOSEC) 20 MG capsule Take 1 capsule (20 mg total) by mouth daily. 90 capsule 3  . predniSONE (DELTASONE) 20 MG tablet Take 2 tablets (40 mg total) by mouth daily with breakfast. 10 tablet 0  . simvastatin (ZOCOR) 40 MG tablet Take 1 tablet (40 mg total) by mouth daily. 90 tablet 3  . vitamin C (ASCORBIC ACID) 250 MG tablet Take 250 mg by mouth daily.     No current facility-administered medications on file prior to visit.     Past Medical History:  Diagnosis Date  . Cancer [facility]) 2004   prostate cancer, Dr. Risa Grill  . Colon polyps   . Deep venous thrombosis ([place]) 1997  Postop  . Depression   . GERD (gastroesophageal reflux disease)   . Hyperlipidemia   . Hypertension   . Shoulder fracture, left 02/23/2011   immobilized in sling , Dr Norris(no surgery)    Past Surgical History:  Procedure Laterality Date  . COLONOSCOPY  2006   Negative,Dr.Edwards  . colonoscopy with polypectomy      PMH of  . JOINT REPLACEMENT  1997, 2001   Total Hip replacement X 2  . LUMBAR SPINE SURGERY  09/28/2010   Dr Rolena Infante; for spinal stenosis  . MECKEL DIVERTICULUM EXCISION     Age 26  . PROSTATECTOMY  2004   w/ radiation, Dr Risa Grill  . VASECTOMY      Social History   Social History  . Marital status: Widowed    Spouse name: N/A  . Number of children: N/A  . Years of  education: N/A   Social History Main Topics  . Smoking status: Former Smoker    Packs/day: 0.50    Years: 5.00    Types: Cigarettes    Quit date: 03/13/1988  . Smokeless tobacco: Former Systems developer     Comment: Quit at about age 83 / chewed tobacca x 1 year  . Alcohol use No  . Drug use: No  . Sexual activity: Not on file   Other Topics Concern  . Not on file   Social History Narrative   Lives alone; Wife passed Dec 17, 2012;   Died of ovarian cancer; breast cancer; other cancer;          Family History  Problem Relation Age of Onset  . Prostate cancer Father   . Testicular cancer Son     Review of Systems  Constitutional: Positive for appetite change and fatigue. Negative for chills and fever.  HENT: Positive for trouble swallowing (new).   Respiratory: Negative for shortness of breath.   Cardiovascular: Positive for leg swelling (right foot - chronic). Negative for chest pain and palpitations.  Musculoskeletal: Positive for gait problem (new - stumbling, using cane).  Neurological: Positive for dizziness (chronic, intermittent) and headaches (chronic). Negative for speech difficulty, weakness and numbness.       Objective:   Vitals:   06/27/16 0847  BP: (!) 156/80  Pulse: 78  Resp: 16  Temp: 97.7 F (36.5 C)   Filed Weights   06/27/16 0847  Weight: 186 lb (84.4 kg)   Body mass index is 23.88 kg/m.  Wt Readings from Last 3 Encounters:  06/27/16 186 lb (84.4 kg)  04/04/16 186 lb 1.9 oz (84.4 kg)  01/26/16 183 lb (83 kg)     Physical Exam  Constitutional: He appears well-developed and well-nourished. No distress.  HENT:  Head: Normocephalic and atraumatic.  Eyes: Conjunctivae are normal.  Neck: Neck supple. No tracheal deviation present. No thyromegaly present.  Cardiovascular: Normal rate, regular rhythm and normal heart sounds.   No murmur heard. Pulmonary/Chest: Effort normal and breath sounds normal. No respiratory distress. He has no wheezes. He has  no rales.  Musculoskeletal: He exhibits no edema.  Lymphadenopathy:    He has no cervical adenopathy.  Neurological: He is alert. No cranial nerve deficit.  Slightly unsteady gait, normal sensation all extremities, normal strength all extremities  Skin: Skin is warm and dry. He is not diaphoretic.  Psychiatric: He has a normal mood and affect. His behavior is normal.          Assessment & Plan:   See Problem List for Assessment and Plan  of chronic medical problems.   FU in 2 weeks

## 2016-06-27 NOTE — Assessment & Plan Note (Signed)
Given recent stroke like symptoms  Will d/c simvastatin and start lipitor

## 2016-06-27 NOTE — Progress Notes (Signed)
Pre visit review using our clinic review tool, if applicable. No additional management support is needed unless otherwise documented below in the visit note. 

## 2016-06-27 NOTE — Assessment & Plan Note (Signed)
BP has been elevated Start amlodipine 5 mg daily cmp today Follow up in 2 weeks

## 2016-06-27 NOTE — Patient Instructions (Addendum)
  Test(s) ordered today. Your results will be released to [place] (or called to you) after review, usually within 72hours after test completion. If any changes need to be made, you will be notified at that same time.   Medications reviewed and updated.  Changes include STOPPING aspirin 81 mg daily and simvastatin and STARTING plavix 75 mg daily, lipitor 40 mg daily and amlodipine 5 mg daily.   Your prescription(s) have been submitted to your pharmacy. Please take as directed and contact our office if you believe you are having problem(s) with the medication(s).  A referral was ordered for neurology  Please followup in 2 week

## 2016-06-28 ENCOUNTER — Encounter: Payer: Self-pay | Admitting: Internal Medicine

## 2016-07-07 ENCOUNTER — Ambulatory Visit
Admission: RE | Admit: 2016-07-07 | Discharge: 2016-07-07 | Disposition: A | Discharge status: Home / Self Care | Payer: Medicare Other | Source: Ambulatory Visit | Attending: Internal Medicine | Admitting: Internal Medicine

## 2016-07-07 DIAGNOSIS — I10 Essential (primary) hypertension: Secondary | ICD-10-CM

## 2016-07-07 DIAGNOSIS — H538 Other visual disturbances: Secondary | ICD-10-CM

## 2016-07-07 DIAGNOSIS — R531 Weakness: Secondary | ICD-10-CM

## 2016-07-07 DIAGNOSIS — R4781 Slurred speech: Secondary | ICD-10-CM

## 2016-07-07 DIAGNOSIS — E7800 Pure hypercholesterolemia, unspecified: Secondary | ICD-10-CM

## 2016-07-13 ENCOUNTER — Telehealth: Payer: Self-pay

## 2016-07-13 ENCOUNTER — Ambulatory Visit (INDEPENDENT_AMBULATORY_CARE_PROVIDER_SITE_OTHER): Payer: Medicare Other | Admitting: Neurology

## 2016-07-13 ENCOUNTER — Encounter: Payer: Self-pay | Admitting: Neurology

## 2016-07-13 VITALS — BP 112/52 | HR 97 | Temp 98.1°F | Ht 74.0 in | Wt 184.0 lb

## 2016-07-13 DIAGNOSIS — I10 Essential (primary) hypertension: Secondary | ICD-10-CM | POA: Diagnosis not present

## 2016-07-13 DIAGNOSIS — I639 Cerebral infarction, unspecified: Secondary | ICD-10-CM

## 2016-07-13 DIAGNOSIS — E785 Hyperlipidemia, unspecified: Secondary | ICD-10-CM

## 2016-07-13 DIAGNOSIS — G44229 Chronic tension-type headache, not intractable: Secondary | ICD-10-CM | POA: Diagnosis not present

## 2016-07-13 NOTE — Progress Notes (Signed)
NEUROLOGY FOLLOW UP OFFICE NOTE  Ronald Stafford [id number]  HISTORY OF PRESENT ILLNESS: Ronald Stafford is an 83 year old right-handed male with hypertension, hyperlipidemia, GERD, lumbar spinal stenosis and history of prostate cancer who follows up for a new problem, stroke.  Around 06/21/16, he woke up not feeling well and was seeing floaters.  He felt dizzy and lightheaded.  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  He also had slurred speech.  He felt that his right side was weak.  His left arm felt numb and tingling but without weakness.  Later that day, he reported difficulty swallowing.  Most of the symptoms resolved after a couple of days, but he continued to have some right sided weakness, difficulty swallowing and stumbling.    He had an MRI of the brain without contrast on 07/07/16, which was personally reviewed and revealed a punctate focus of slightly reduced diffusion within the left posterior temporal lobe, likely a subacute infarction.  Thus far, there has been no change in his medications.  He continues to take Plavix 75mg  daily, atorvastatin 40mg  daily, and amlodipine 5mg  daily.  Labs from a year ago (06/22/15) demonstrated an LDL of 125 and Hgb A1c of 5.8.  Since 2015, he has been experiencing daily headache.  They are squeezing pain in a band-like distribution.  They are usually 5-6/10 but may be higher.  They usually last 2 hours and occur 2 to 3 times a day.  He notes some blurred vision.  Rarely, he has noted nausea.  He denies photophobia or phonophobia.  Singing in choir makes it worse.  He has been taking Tylenol daily.  In addition, the headache is sometimes accompanied by dizziness, which he describes as feeling "light in the head" rather than spinning sensation.  For about 2 years, he has had frequent falls.  He has right ankle instability, for which he wears a brace.  However, he feels the falls are due to inability to keep balance.  He does not some neck  pain and hears his neck crunch when he turns his head.  He reports remote history of headaches in the 1970s.  He takes gabapentin 100mg  at bedtime.  PAST MEDICAL HISTORY: Past Medical History:  Diagnosis Date  . Cancer [facility]) 2004   prostate cancer, Dr. Risa Grill  . Colon polyps   . Deep venous thrombosis ([place]) 1997   Postop  . Depression   . GERD (gastroesophageal reflux disease)   . Hyperlipidemia   . Hypertension   . Shoulder fracture, left 02/23/2011   immobilized in sling , Dr Norris(no surgery)    MEDICATIONS: Current Outpatient Prescriptions on File Prior to Visit  Medication Sig Dispense Refill  . acetaminophen (TYLENOL) 325 MG tablet Take 650 mg by mouth every 6 (six) hours as needed for pain. Pain    . amLODipine (NORVASC) 5 MG tablet Take 1 tablet (5 mg total) by mouth daily. 90 tablet 3  . atorvastatin (LIPITOR) 40 MG tablet Take 1 tablet (40 mg total) by mouth daily. 90 tablet 3  . clopidogrel (PLAVIX) 75 MG tablet Take 1 tablet (75 mg total) by mouth daily. 90 tablet 3  . gabapentin (NEURONTIN) 100 MG capsule Take 1 capsule (100 mg total) by mouth at bedtime. 90 capsule 3  . Omega-3 Fatty Acids (FISH OIL) 1000 MG CAPS Take by mouth.    . vitamin C (ASCORBIC ACID) 250 MG tablet Take 250 mg by mouth daily.    [name]  omeprazole (PRILOSEC) 20 MG capsule Take 1 capsule (20 mg total) by mouth daily. (Patient not taking: Reported on 07/13/2016) 90 capsule 3   No current facility-administered medications on file prior to visit.     ALLERGIES: Allergies  Allergen Reactions  . Chicken Allergy Nausea And Vomiting    Digestive problems    FAMILY HISTORY: Family History  Problem Relation Age of Onset  . Prostate cancer Father   . Testicular cancer Son     SOCIAL HISTORY: Social History   Social History  . Marital status: Widowed    Spouse name: N/A  . Number of children: N/A  . Years of education: N/A   Occupational History  . Not on file.   Social History Main Topics   . Smoking status: Former Smoker    Packs/day: 0.50    Years: 5.00    Types: Cigarettes    Quit date: 03/13/1988  . Smokeless tobacco: Former Systems developer     Comment: Quit at about age 48 / chewed tobacca x 1 year  . Alcohol use No  . Drug use: No  . Sexual activity: Not on file   Other Topics Concern  . Not on file   Social History Narrative   Lives alone; Wife passed 2012/12/24;   Died of ovarian cancer; breast cancer; other cancer;          REVIEW OF SYSTEMS: Constitutional: No fevers, chills, or sweats, no generalized fatigue, change in appetite Eyes: No visual changes, double vision, eye pain Ear, nose and throat: No hearing loss, ear pain, nasal congestion, sore throat Cardiovascular: No chest pain, palpitations Respiratory:  No shortness of breath at rest or with exertion, wheezes GastrointestinaI: No nausea, vomiting, diarrhea, abdominal pain, fecal incontinence Genitourinary:  No dysuria, urinary retention or frequency Musculoskeletal:  No neck pain, back pain Integumentary: No rash, pruritus, skin lesions Neurological: as above Psychiatric: No depression, insomnia, anxiety Endocrine: No palpitations, fatigue, diaphoresis, mood swings, change in appetite, change in weight, increased thirst Hematologic/Lymphatic:  No purpura, petechiae. Allergic/Immunologic: no itchy/runny eyes, nasal congestion, recent allergic reactions, rashes  PHYSICAL EXAM: Vitals:   07/13/16 1434  BP: (!) 112/52  Pulse: 97  Temp: 98.1 F (36.7 C)   General: No acute distress.  Patient appears well-groomed.  normal body habitus. Head:  Normocephalic/atraumatic Eyes:  Fundi examined but not visualized Neck: supple, no paraspinal tenderness, full range of motion Heart:  Regular rate and rhythm Lungs:  Clear to auscultation bilaterally Back: No paraspinal tenderness Neurological Exam: alert and oriented to person, place, and time. Attention span and concentration intact, recent and remote  memory intact, fund of knowledge intact.  Speech fluent and not dysarthric, language intact.  CN II-XII intact. Bulk and tone normal, muscle strength 5/5 throughout.  Sensation to light touch  intact.  Deep tendon reflexes 2+ throughout.  Finger to nose testing intact.  Gait normal, Romberg with sway  IMPRESSION: 1.  Probable cerebrovascular event.  However, MRI finding likely incidental as it does not explain his symptoms based on size and location of the infarct.  Stroke on MRI likely secondary to small vessel disease. 2.  Chronic tension-type headache  PLAN: 1.  Titrate gabapentin to 200mg  twice daily 2.  Continue Plavix 75mg  daily 3.  Continue atorvastatin 40mg  daily.  Check fasting lipid panel (LDL goal less than 70) 4.  Check 2D echo.  He has carotid doppler scheduled for tomorrow 5.  Limit use of Tylenol to no more than 2 days  out of the week to prevent rebound headache. 6.  Follow up in 4 months.  Metta Clines, DO  CC:  Billey Gosling, MD

## 2016-07-13 NOTE — Telephone Encounter (Signed)
Called [employer] to precert Echo [id number].  [phone].  Approved [id number].  Patient is schedule 07/26/16 at 3pm at [facility] [address].  Patient aware.

## 2016-07-13 NOTE — Patient Instructions (Signed)
1.  Continue Plavix 75mg  daily 2.  Continue atorvastatin 40mg  daily.  We will check a fasting lipid panel 3.  We will check 2D echocardiogram 4.  We will slowly increase dose of gabapentin:   Take 1 pill in morning and 1 pill at night for 7 days,   Then 1 pill in morning and 2 pills at night for 7 days.   Then 2 pills in morning and 2 pills at night 5.  Limit use of tylenol to no more than 2 days out of the week to prevent rebound headache 6.  Follow up in 4 months.

## 2016-07-14 ENCOUNTER — Other Ambulatory Visit: Payer: Medicare Other

## 2016-07-14 DIAGNOSIS — G44229 Chronic tension-type headache, not intractable: Secondary | ICD-10-CM

## 2016-07-14 DIAGNOSIS — I639 Cerebral infarction, unspecified: Secondary | ICD-10-CM

## 2016-07-14 DIAGNOSIS — E785 Hyperlipidemia, unspecified: Secondary | ICD-10-CM

## 2016-07-14 DIAGNOSIS — I10 Essential (primary) hypertension: Secondary | ICD-10-CM

## 2016-07-15 LAB — LIPID PANEL
CHOLESTEROL: 122 mg/dL (ref <200)
HDL: 43 mg/dL (ref >40)
LDL Cholesterol: 66 mg/dL (ref <100)
TRIGLYCERIDES: 63 mg/dL (ref <150)
Total CHOL/HDL Ratio: 2.8 Ratio (ref <5.0)
VLDL: 13 mg/dL (ref <30)

## 2016-07-17 ENCOUNTER — Telehealth: Payer: Self-pay

## 2016-07-17 NOTE — Telephone Encounter (Signed)
Left message for patient to call office regarding his lab results and recommendations.

## 2016-07-17 NOTE — Telephone Encounter (Signed)
Patient aware of cholesterol results and recommendations.

## 2016-07-17 NOTE — Telephone Encounter (Signed)
Patient is returning your call.  

## 2016-07-17 NOTE — Telephone Encounter (Signed)
-----   Message from Pieter Partridge, DO sent at 07/17/2016 10:42 AM EDT ----- Cholesterol looks ok.  I wouldn't make any changes to his atorvastatin.

## 2016-07-18 ENCOUNTER — Ambulatory Visit (HOSPITAL_COMMUNITY)
Admission: RE | Admit: 2016-07-18 | Discharge: 2016-07-18 | Disposition: A | Discharge status: Home / Self Care | Payer: Medicare Other | Source: Ambulatory Visit | Attending: Cardiology | Admitting: Cardiology

## 2016-07-18 DIAGNOSIS — E7800 Pure hypercholesterolemia, unspecified: Secondary | ICD-10-CM | POA: Diagnosis not present

## 2016-07-18 DIAGNOSIS — I6523 Occlusion and stenosis of bilateral carotid arteries: Secondary | ICD-10-CM | POA: Diagnosis not present

## 2016-07-18 DIAGNOSIS — R531 Weakness: Secondary | ICD-10-CM

## 2016-07-18 DIAGNOSIS — I10 Essential (primary) hypertension: Secondary | ICD-10-CM | POA: Diagnosis not present

## 2016-07-18 DIAGNOSIS — H538 Other visual disturbances: Secondary | ICD-10-CM

## 2016-07-18 DIAGNOSIS — R4781 Slurred speech: Secondary | ICD-10-CM

## 2016-07-19 ENCOUNTER — Ambulatory Visit: Payer: Medicare Other | Admitting: Neurology

## 2016-07-20 ENCOUNTER — Encounter: Payer: Self-pay | Admitting: Internal Medicine

## 2016-07-20 DIAGNOSIS — I739 Peripheral vascular disease, unspecified: Secondary | ICD-10-CM

## 2016-07-20 DIAGNOSIS — I779 Disorder of arteries and arterioles, unspecified: Secondary | ICD-10-CM | POA: Insufficient documentation

## 2016-07-26 ENCOUNTER — Ambulatory Visit (HOSPITAL_COMMUNITY): Payer: Medicare Other | Attending: Cardiovascular Disease

## 2016-07-26 ENCOUNTER — Other Ambulatory Visit: Payer: Self-pay

## 2016-07-26 DIAGNOSIS — I081 Rheumatic disorders of both mitral and tricuspid valves: Secondary | ICD-10-CM | POA: Insufficient documentation

## 2016-07-26 DIAGNOSIS — I639 Cerebral infarction, unspecified: Secondary | ICD-10-CM | POA: Diagnosis present

## 2016-07-26 DIAGNOSIS — Z87891 Personal history of nicotine dependence: Secondary | ICD-10-CM | POA: Diagnosis not present

## 2016-07-26 DIAGNOSIS — E785 Hyperlipidemia, unspecified: Secondary | ICD-10-CM | POA: Diagnosis not present

## 2016-07-26 DIAGNOSIS — I10 Essential (primary) hypertension: Secondary | ICD-10-CM | POA: Diagnosis not present

## 2016-07-26 DIAGNOSIS — R7303 Prediabetes: Secondary | ICD-10-CM | POA: Diagnosis not present

## 2016-07-26 DIAGNOSIS — G44229 Chronic tension-type headache, not intractable: Secondary | ICD-10-CM

## 2016-07-30 ENCOUNTER — Other Ambulatory Visit: Payer: Self-pay

## 2016-07-30 ENCOUNTER — Emergency Department (HOSPITAL_COMMUNITY): Payer: Medicare Other

## 2016-07-30 ENCOUNTER — Other Ambulatory Visit (HOSPITAL_COMMUNITY): Payer: Self-pay

## 2016-07-30 ENCOUNTER — Observation Stay (HOSPITAL_COMMUNITY)
Admission: EM | Admit: 2016-07-30 | Discharge: 2016-07-31 | Disposition: A | Discharge status: Home / Self Care | Payer: Medicare Other | Attending: Internal Medicine | Admitting: Internal Medicine

## 2016-07-30 ENCOUNTER — Encounter (HOSPITAL_COMMUNITY): Payer: Self-pay

## 2016-07-30 DIAGNOSIS — D7589 Other specified diseases of blood and blood-forming organs: Secondary | ICD-10-CM

## 2016-07-30 DIAGNOSIS — R0789 Other chest pain: Secondary | ICD-10-CM | POA: Diagnosis not present

## 2016-07-30 DIAGNOSIS — R079 Chest pain, unspecified: Secondary | ICD-10-CM | POA: Diagnosis present

## 2016-07-30 DIAGNOSIS — E7800 Pure hypercholesterolemia, unspecified: Secondary | ICD-10-CM | POA: Diagnosis present

## 2016-07-30 DIAGNOSIS — Z7902 Long term (current) use of antithrombotics/antiplatelets: Secondary | ICD-10-CM | POA: Diagnosis not present

## 2016-07-30 DIAGNOSIS — Z79899 Other long term (current) drug therapy: Secondary | ICD-10-CM | POA: Insufficient documentation

## 2016-07-30 DIAGNOSIS — I10 Essential (primary) hypertension: Secondary | ICD-10-CM | POA: Diagnosis not present

## 2016-07-30 DIAGNOSIS — Z8673 Personal history of transient ischemic attack (TIA), and cerebral infarction without residual deficits: Secondary | ICD-10-CM | POA: Diagnosis not present

## 2016-07-30 DIAGNOSIS — D696 Thrombocytopenia, unspecified: Secondary | ICD-10-CM

## 2016-07-30 DIAGNOSIS — Z86718 Personal history of other venous thrombosis and embolism: Secondary | ICD-10-CM

## 2016-07-30 DIAGNOSIS — G459 Transient cerebral ischemic attack, unspecified: Secondary | ICD-10-CM | POA: Insufficient documentation

## 2016-07-30 DIAGNOSIS — M545 Low back pain, unspecified: Secondary | ICD-10-CM | POA: Diagnosis present

## 2016-07-30 DIAGNOSIS — R519 Headache, unspecified: Secondary | ICD-10-CM | POA: Diagnosis present

## 2016-07-30 DIAGNOSIS — Z87891 Personal history of nicotine dependence: Secondary | ICD-10-CM | POA: Insufficient documentation

## 2016-07-30 DIAGNOSIS — E785 Hyperlipidemia, unspecified: Secondary | ICD-10-CM | POA: Diagnosis not present

## 2016-07-30 DIAGNOSIS — I779 Disorder of arteries and arterioles, unspecified: Secondary | ICD-10-CM | POA: Diagnosis present

## 2016-07-30 DIAGNOSIS — K219 Gastro-esophageal reflux disease without esophagitis: Secondary | ICD-10-CM | POA: Diagnosis not present

## 2016-07-30 DIAGNOSIS — Z96649 Presence of unspecified artificial hip joint: Secondary | ICD-10-CM | POA: Diagnosis not present

## 2016-07-30 DIAGNOSIS — R072 Precordial pain: Secondary | ICD-10-CM | POA: Diagnosis not present

## 2016-07-30 DIAGNOSIS — R7303 Prediabetes: Secondary | ICD-10-CM | POA: Diagnosis present

## 2016-07-30 DIAGNOSIS — R51 Headache: Secondary | ICD-10-CM

## 2016-07-30 DIAGNOSIS — R911 Solitary pulmonary nodule: Secondary | ICD-10-CM | POA: Diagnosis present

## 2016-07-30 DIAGNOSIS — I739 Peripheral vascular disease, unspecified: Secondary | ICD-10-CM

## 2016-07-30 LAB — I-STAT TROPONIN, ED: TROPONIN I, POC: 0.02 ng/mL (ref 0.00–0.08)

## 2016-07-30 LAB — TROPONIN I

## 2016-07-30 LAB — CBC
HCT: 44 % (ref 39.0–52.0)
Hemoglobin: 15 g/dL (ref 13.0–17.0)
MCH: 31 pg (ref 26.0–34.0)
MCHC: 34.1 g/dL (ref 30.0–36.0)
MCV: 90.9 fL (ref 78.0–100.0)
PLATELETS: 107 10*3/uL — AB (ref 150–400)
RBC: 4.84 MIL/uL (ref 4.22–5.81)
RDW: 13.5 % (ref 11.5–15.5)
WBC: 4.1 10*3/uL (ref 4.0–10.5)

## 2016-07-30 LAB — BASIC METABOLIC PANEL
ANION GAP: 9 (ref 5–15)
BUN: 12 mg/dL (ref 6–20)
CHLORIDE: 106 mmol/L (ref 101–111)
CO2: 24 mmol/L (ref 22–32)
Calcium: 9.5 mg/dL (ref 8.9–10.3)
Creatinine, Ser: 0.85 mg/dL (ref 0.61–1.24)
Glucose, Bld: 94 mg/dL (ref 65–99)
POTASSIUM: 3.7 mmol/L (ref 3.5–5.1)
SODIUM: 139 mmol/L (ref 135–145)

## 2016-07-30 MED ORDER — SODIUM CHLORIDE 0.9 % IV SOLN
INTRAVENOUS | Status: DC
  Administered 2016-07-30: 16:00:00 via INTRAVENOUS

## 2016-07-30 MED ORDER — GABAPENTIN 100 MG PO CAPS
100.0000 mg | ORAL_CAPSULE | Freq: Every day | ORAL | Status: DC
Start: 2016-07-30 — End: 2016-07-31
  Administered 2016-07-30: 100 mg via ORAL
  Filled 2016-07-30: qty 1

## 2016-07-30 MED ORDER — ONDANSETRON HCL 4 MG/2ML IJ SOLN: 4.0000 mg | Freq: Four times a day (QID) | INTRAMUSCULAR | Status: DC | PRN

## 2016-07-30 MED ORDER — NITROGLYCERIN 0.4 MG SL SUBL
0.4000 mg | SUBLINGUAL_TABLET | SUBLINGUAL | Status: DC | PRN
  Administered 2016-07-30: 0.4 mg via SUBLINGUAL
  Filled 2016-07-30: qty 1

## 2016-07-30 MED ORDER — ACETAMINOPHEN 325 MG PO TABS: 650.0000 mg | ORAL_TABLET | ORAL | Status: DC | PRN

## 2016-07-30 MED ORDER — AMLODIPINE BESYLATE 5 MG PO TABS
5.0000 mg | ORAL_TABLET | Freq: Every day | ORAL | Status: DC
  Administered 2016-07-31: 5 mg via ORAL
  Filled 2016-07-30: qty 1

## 2016-07-30 MED ORDER — CLOPIDOGREL BISULFATE 75 MG PO TABS
75.0000 mg | ORAL_TABLET | Freq: Every day | ORAL | Status: DC
  Administered 2016-07-31: 75 mg via ORAL
  Filled 2016-07-30: qty 1

## 2016-07-30 MED ORDER — MORPHINE SULFATE (PF) 4 MG/ML IV SOLN
1.0000 mg | INTRAVENOUS | Status: DC | PRN
Start: 2016-07-30 — End: 2016-07-31

## 2016-07-30 MED ORDER — ENOXAPARIN SODIUM 40 MG/0.4ML ~~LOC~~ SOLN: 40.0000 mg | SUBCUTANEOUS | Status: DC

## 2016-07-30 MED ORDER — GI COCKTAIL ~~LOC~~
30.0000 mL | Freq: Four times a day (QID) | ORAL | Status: DC | PRN
  Administered 2016-07-30: 30 mL via ORAL
  Filled 2016-07-30: qty 30

## 2016-07-30 MED ORDER — ATORVASTATIN CALCIUM 40 MG PO TABS
40.0000 mg | ORAL_TABLET | Freq: Every day | ORAL | Status: DC
  Administered 2016-07-30 – 2016-07-31 (×2): 40 mg via ORAL
  Filled 2016-07-30 (×2): qty 1

## 2016-07-30 NOTE — H&P (Signed)
History and Physical    Ronald Stafford JQB:[id number] DOB: Oct 02, 1930 DOA: 07/30/2016   PCP: Binnie Rail, MD   Patient coming from:  Home    Chief Complaint: Chest pain   HPI: Ronald Stafford is a 83 y.o. male with medical history significant for HTN, HLD, recent diagnosis of TIA, on daily full dose of aspirin, presenting to the ED with acute onset of chest pain 3 hours prior to arrival, after leaving chart. The patient describes the test tool, radiating to the left epigastric region, to the left shoulder. There was no associated diaphoresis, nausea or vomiting. About 3 weeks ago, the patient describes similar chest discomfort, but these was more pronounced. He denies any syncope or presyncope. He denies any unilateral weakness. No vision changes, headaches, or confusion. Of note, the patient has been diagnosed with TIA about 3 weeks ago, having had a full neurological examination as outpatient, and no stroke was diagnosed.  He denies any shortness of breath, cough, hemoptysis. He denies any dysuria or gross hematuria. He denies any leg swelling or calf pain. No recent long distance trips. He denies taking any herbal supplements or hormonal supplements. The patient never had a cardiac evaluation or had a cardiac catheterization. He does not smoke. No alcohol or recreational drugs. Patient denies any strong family history of cardiac disease.    ED Course:  BP (!) 147/78   Pulse 72   Temp 97.6 F (36.4 C) (Oral)   Resp 11   Ht 6\' 1"  (1.854 m)   Wt 83.5 kg (184 lb)   SpO2 93%   BMI 24.28 kg/m   echocardiogram in April 2018 shows normal LV function, EF 55 to 60%.. EKG sinus rhythm without ACS changes Troponin  0.02 CBC remarkable for mild thrombocytopenia, platelet count 107, was one 14 in 06/27/2016, and 131 on April 11. Chemistries are unremarkable Glucose 94  chest x-ray no acute findings.  Most recent lipid panel shows cholesterol 122, HDL 43 LDL 66 triglycerides 63, total cholesterol -  HDL ratio 2.8. TSH on 06/27/2016 was 2.81   Review of Systems:  As per HPI otherwise all other systems reviewed and are negative  Past Medical History:  Diagnosis Date  . Cancer [facility]) 2004   prostate cancer, Dr. Risa Grill  . Colon polyps   . Deep venous thrombosis ([place]) 1997   Postop  . Depression   . GERD (gastroesophageal reflux disease)   . Hyperlipidemia   . Hypertension   . Shoulder fracture, left 02/23/2011   immobilized in sling , Dr Norris(no surgery)    Past Surgical History:  Procedure Laterality Date  . COLONOSCOPY  2006   Negative,Dr.Edwards  . colonoscopy with polypectomy      PMH of  . JOINT REPLACEMENT  1997, 2001   Total Hip replacement X 2  . LUMBAR SPINE SURGERY  09/28/2010   Dr Rolena Infante; for spinal stenosis  . MECKEL DIVERTICULUM EXCISION     Age 39  . PROSTATECTOMY  2004   w/ radiation, Dr Risa Grill  . VASECTOMY      Social History Social History   Social History  . Marital status: Widowed    Spouse name: N/A  . Number of children: N/A  . Years of education: N/A   Occupational History  . Not on file.   Social History Main Topics  . Smoking status: Former Smoker    Packs/day: 0.50    Years: 5.00    Types: Cigarettes    Quit  date: 03/13/1988  . Smokeless tobacco: Former Systems developer     Comment: Quit at about age 63 / chewed tobacca x 1 year  . Alcohol use No  . Drug use: No  . Sexual activity: Not on file   Other Topics Concern  . Not on file   Social History Narrative   Lives alone; Wife passed Dec 28, 2012;   Died of ovarian cancer; breast cancer; other cancer;           Allergies  Allergen Reactions  . Chicken Allergy Nausea And Vomiting    Digestive problems    Family History  Problem Relation Age of Onset  . Prostate cancer Father   . Testicular cancer Son       Prior to Admission medications   Medication Sig Start Date End Date Taking? Authorizing Provider  acetaminophen (TYLENOL) 325 MG tablet Take 650 mg by mouth  every 6 (six) hours as needed for pain. Pain    [provider]  amLODipine (NORVASC) 5 MG tablet Take 1 tablet (5 mg total) by mouth daily. 06/27/16   Binnie Rail, MD  atorvastatin (LIPITOR) 40 MG tablet Take 1 tablet (40 mg total) by mouth daily. 06/27/16   Binnie Rail, MD  clopidogrel (PLAVIX) 75 MG tablet Take 1 tablet (75 mg total) by mouth daily. 06/27/16   Binnie Rail, MD  gabapentin (NEURONTIN) 100 MG capsule Take 1 capsule (100 mg total) by mouth at bedtime. 08/11/15   Binnie Rail, MD  Omega-3 Fatty Acids (FISH OIL) 1000 MG CAPS Take by mouth.    [provider]  omeprazole (PRILOSEC) 20 MG capsule Take 1 capsule (20 mg total) by mouth daily. Patient not taking: Reported on 07/13/2016 06/22/15   Binnie Rail, MD  vitamin C (ASCORBIC ACID) 250 MG tablet Take 250 mg by mouth daily.    [provider]    Physical Exam:  Vitals:   07/30/16 1415 07/30/16 1430 07/30/16 1500 07/30/16 1515  BP: 130/80 (!) 150/76 (!) 158/79 (!) 147/78  Pulse: 73 72 66 72  Resp: (!) 22 15 18 11   Temp:      TempSrc:      SpO2: 95% 95% 96% 93%  Weight:      Height:       Constitutional: NAD, calm, comfortable  Eyes: PERRL, lids and conjunctivae normal ENMT: Mucous membranes are moist, without exudate or lesions  Neck: normal, supple, no masses, no thyromegaly Respiratory: clear to auscultation bilaterally, no wheezing, no crackles. Normal respiratory effort  Cardiovascular: Regular rate and rhythm, no murmurs, rubs or gallops. No extremity edema. 2+ pedal pulses. No carotid bruits.  Abdomen: Soft, non tender, No hepatosplenomegaly. Bowel sounds positive.  Musculoskeletal: no clubbing / cyanosis. Moves all extremities Skin: no jaundice, No lesions.  Neurologic: Sensation intact  Strength equal in all extremities Psychiatric:   Alert and oriented x 3. Normal mood.     Labs on Admission: I have personally reviewed following labs and imaging studies  CBC:  Recent  Labs Lab 07/30/16 1309  WBC 4.1  HGB 15.0  HCT 44.0  MCV 90.9  PLT 107*    Basic Metabolic Panel:  Recent Labs Lab 07/30/16 1309  NA 139  K 3.7  CL 106  CO2 24  GLUCOSE 94  BUN 12  CREATININE 0.85  CALCIUM 9.5    GFR: Estimated Creatinine Clearance: 74.4 mL/min (by C-G formula based on SCr of 0.85 mg/dL).  Liver Function Tests: No  results for input(s): AST, ALT, ALKPHOS, BILITOT, PROT, ALBUMIN in the last 168 hours. No results for input(s): LIPASE, AMYLASE in the last 168 hours. No results for input(s): AMMONIA in the last 168 hours.  Coagulation Profile: No results for input(s): INR, PROTIME in the last 168 hours.  Cardiac Enzymes: No results for input(s): CKTOTAL, CKMB, CKMBINDEX, TROPONINI in the last 168 hours.  BNP (last 3 results) No results for input(s): PROBNP in the last 8760 hours.  HbA1C: No results for input(s): HGBA1C in the last 72 hours.  CBG: No results for input(s): GLUCAP in the last 168 hours.  Lipid Profile: No results for input(s): CHOL, HDL, LDLCALC, TRIG, CHOLHDL, LDLDIRECT in the last 72 hours.  Thyroid Function Tests: No results for input(s): TSH, T4TOTAL, FREET4, T3FREE, THYROIDAB in the last 72 hours.  Anemia Panel: No results for input(s): VITAMINB12, FOLATE, FERRITIN, TIBC, IRON, RETICCTPCT in the last 72 hours.  Urine analysis:    Component Value Date/Time   COLORURINE straw 05/21/2008 1424   APPEARANCEUR Clear 05/21/2008 1424   LABSPEC 1.025 05/21/2008 1424   PHURINE 6.0 05/21/2008 1424   HGBUR negative 05/21/2008 1424   BILIRUBINUR negative 05/21/2008 1424   UROBILINOGEN 0.2 05/21/2008 1424   NITRITE negative 05/21/2008 1424    Sepsis Labs: @LABRCNTIP (procalcitonin:4,lacticidven:4) )No results found for this or any previous visit (from the past 240 hour(s)).   Radiological Exams on Admission: Dg Chest 2 View  Result Date: 07/30/2016 CLINICAL DATA:  Midsternal chest pain. EXAM: CHEST  2 VIEW COMPARISON:  None.  FINDINGS: The heart size and mediastinal contours are within normal limits. Both lungs are clear. The visualized skeletal structures are unremarkable. IMPRESSION: No active cardiopulmonary disease. Electronically Signed   By: Dorise Bullion III M.D   On: 07/30/2016 13:54    EKG: Independently reviewed.  Assessment/Plan Active Problems:   Chest pain   Hyperlipidemia   Essential hypertension   GERD   LOW BACK PAIN SYNDROME   Solitary pulmonary nodule   Prediabetes   Frequent headaches   Carotid arterial disease (HCC)   History of DVT (deep vein thrombosis)   TIA (transient ischemic attack)   Thrombocytopenia (HCC)    Chest pain syndrome, cardiac verus GI  HEART score 4. Troponin 0.02 , EKG NSR without evidence of ACS. CP relieved after full dose of ASA, did not take NTG CXR unrevealing. 2 D echo on 06/2016 normal  LVF EF 55-60 Risk factors include recent TIA, HTN, HLD. No history of DM .   Admit to Telemetry/ Observation Chest pain order set Cycle troponin EKG in am or prn for chest pain  continue ASA, Plavix  O2 and NTG as needed Check A1C Continue Statins  GI cocktail   Hypertension BP 147/78   Pulse 72    Controlled. Patient took his morning meds  Continue home anti-hypertensive medications    Hyperlipidemia. Most recent lipid panel shows cholesterol 122, HDL 43 LDL 66 triglycerides 63, total cholesterol - HDL ratio 2.8. Continue home statins   GERD, patient was on prn PPI,  will provide GI cocktail, Protonix daily    Recent history of TIA on 06/2015, full outpatient workup, no stroke noted  On full ASA daily and statins . No unilateral weakness or other neuro deficiencies at this time . Recent diagnosis of  Mild 1-39 % bilateral carotid artery disease, normal 2 D echo. No symptoms at this time   Continue present therapy with ASA, PLavix  and lipitor daily  Continue Neurontin  Thrombocytopenia  Likely due to recent full dose ASA  Therapy. platelet count 107, was one  14 in 06/27/2016, and 131 on April 01/2017 . No ETOH. Exam without splenomegaly. NO bleeding issues  No transfusion is indicated at this time Monitor counts closely Hold Lovenox if  platelets drop to less than 50,000   History of hyperglycemia, with normal values today Current blood sugar level is 94, last A1C 1 year ago 5.8 Lab Results  Component Value Date   HGBA1C 5.8 06/22/2015  Hgb A1C   DVT prophylaxis: Lovenox  Code Status:   Full     Family Communication:  Discussed with patient Disposition Plan: Expect patient to be discharged to home after condition improves Consults called:    none Admission status:Tele  Obs     Jamilya Sarrazin E, PA-C [facility]   07/30/2016, 3:33 PM

## 2016-07-30 NOTE — ED Provider Notes (Signed)
[facility] DEPT Provider Note   CSN: [id number] Arrival date & time: 07/30/16  1217     History   Chief Complaint Chief Complaint  Patient presents with  . Chest Pain    HPI Ronald Stafford is a 83 y.o. male.  Patient with history of recent left temporal stroke status post workup, hypertension, hyperlipidemia, prostate cancer, no history of MI or stent placement, remote stress test -- presents with complaint of acute onset, stabbing, chest pain in the mid/left chest with radiation to his left shoulder at approximately 9:30 AM while at rest at church. EMS was called. Aspirin was administered. Pain was 7-8 out of 10 at worst. Now 5 out of 10. No associated diaphoresis or vomiting. No shortness of breath, lower extremity edema, orthopnea. No back pain or neck pain. The onset of this condition was acute. The course is constant. Aggravating factors: none. Alleviating factors: none.        Past Medical History:  Diagnosis Date  . Cancer [facility]) 2004   prostate cancer, Dr. Risa Grill  . Colon polyps   . Deep venous thrombosis ([place]) 1997   Postop  . Depression   . GERD (gastroesophageal reflux disease)   . Hyperlipidemia   . Hypertension   . Shoulder fracture, left 02/23/2011   immobilized in sling , Dr Norris(no surgery)    Patient Active Problem List   Diagnosis Date Noted  . Carotid arterial disease ([place]) 07/20/2016  . Right sided weakness 06/27/2016  . Blurry vision 06/27/2016  . Slurred speech 06/27/2016  . Cough 01/26/2016  . Left wrist pain 08/11/2015  . Frequent headaches 06/22/2015  . Prediabetes 02/11/2015  . Episodic paroxysmal hemicrania, not intractable 02/11/2015  . Occult blood positive stool 09/27/2014  . CAP (community acquired pneumonia) 09/25/2014  . Peroneal tendonitis of right lower extremity 01/12/2014  . Mass of right ankle 12/31/2013  . Right ankle instability 12/31/2013  . Radial head fracture, closed 09/18/2013  . Lateral epicondylitis of right  elbow 09/16/2013  . Solitary pulmonary nodule 08/08/2012  . SPINAL STENOSIS, LUMBAR 04/06/2010  . LOW BACK PAIN SYNDROME 11/02/2009  . FOOT DROP, RIGHT 05/21/2008  . Other nonthrombocytopenic purpuras 10/21/2007  . Hyperlipidemia 12/25/2006  . DEPRESSION 12/25/2006  . Essential hypertension 12/25/2006  . GERD 12/25/2006  . PROSTATE CANCER, HX OF 12/25/2006  . POLYP, COLON 05/02/2006    Past Surgical History:  Procedure Laterality Date  . COLONOSCOPY  2006   Negative,Dr.Edwards  . colonoscopy with polypectomy      PMH of  . JOINT REPLACEMENT  1997, 2001   Total Hip replacement X 2  . LUMBAR SPINE SURGERY  09/28/2010   Dr Rolena Infante; for spinal stenosis  . MECKEL DIVERTICULUM EXCISION     Age 76  . PROSTATECTOMY  2004   w/ radiation, Dr Risa Grill  . VASECTOMY         Home Medications    Prior to Admission medications   Medication Sig Start Date End Date Taking? Authorizing Provider  acetaminophen (TYLENOL) 325 MG tablet Take 650 mg by mouth every 6 (six) hours as needed for pain. Pain    [provider]  amLODipine (NORVASC) 5 MG tablet Take 1 tablet (5 mg total) by mouth daily. 06/27/16   Binnie Rail, MD  atorvastatin (LIPITOR) 40 MG tablet Take 1 tablet (40 mg total) by mouth daily. 06/27/16   Binnie Rail, MD  clopidogrel (PLAVIX) 75 MG tablet Take 1 tablet (75 mg total) by mouth daily. 06/27/16  Binnie Rail, MD  gabapentin (NEURONTIN) 100 MG capsule Take 1 capsule (100 mg total) by mouth at bedtime. 08/11/15   Binnie Rail, MD  Omega-3 Fatty Acids (FISH OIL) 1000 MG CAPS Take by mouth.    [provider]  omeprazole (PRILOSEC) 20 MG capsule Take 1 capsule (20 mg total) by mouth daily. Patient not taking: Reported on 07/13/2016 06/22/15   Binnie Rail, MD  vitamin C (ASCORBIC ACID) 250 MG tablet Take 250 mg by mouth daily.    [provider]    Family History Family History  Problem Relation Age of Onset  . Prostate cancer Father   .  Testicular cancer Son     Social History Social History  Substance Use Topics  . Smoking status: Former Smoker    Packs/day: 0.50    Years: 5.00    Types: Cigarettes    Quit date: 03/13/1988  . Smokeless tobacco: Former Systems developer     Comment: Quit at about age 37 / chewed tobacca x 1 year  . Alcohol use No     Allergies   Chicken allergy   Review of Systems Review of Systems  Constitutional: Negative for diaphoresis and fever.  Eyes: Negative for redness.  Respiratory: Negative for cough and shortness of breath.   Cardiovascular: Positive for chest pain. Negative for palpitations and leg swelling.  Gastrointestinal: Negative for abdominal pain, nausea and vomiting.  Genitourinary: Negative for dysuria.  Musculoskeletal: Negative for back pain and neck pain.  Skin: Negative for rash.  Neurological: Negative for syncope and light-headedness.  Psychiatric/Behavioral: The patient is not nervous/anxious.      Physical Exam Updated Vital Signs BP 133/70 (BP Location: Right Arm)   Pulse 71   Temp 97.6 F (36.4 C) (Oral)   Resp 16   Ht 6\' 1"  (1.854 m)   Wt 184 lb (83.5 kg)   SpO2 98% Comment: Simultaneous filing. User may not have seen previous data.  BMI 24.28 kg/m   Physical Exam  Constitutional: He appears well-developed and well-nourished.  HENT:  Head: Normocephalic and atraumatic.  Mouth/Throat: Oropharynx is clear and moist and mucous membranes are normal. Mucous membranes are not dry.  Eyes: Conjunctivae are normal.  Neck: Trachea normal and normal range of motion. Neck supple. Normal carotid pulses and no JVD present. No muscular tenderness present. Carotid bruit is not present. No tracheal deviation present.  Cardiovascular: Normal rate, regular rhythm, S1 normal, S2 normal, normal heart sounds and intact distal pulses.  Exam reveals no distant heart sounds and no decreased pulses.   No murmur heard. Pulmonary/Chest: Effort normal and breath sounds normal. No  respiratory distress. He has no wheezes. He exhibits no tenderness.  Abdominal: Soft. Normal aorta and bowel sounds are normal. There is tenderness (mild LUQ). There is no rebound and no guarding.  Musculoskeletal: He exhibits no edema.  Neurological: He is alert.  Skin: Skin is warm and dry. He is not diaphoretic. No cyanosis. No pallor.  Psychiatric: He has a normal mood and affect.  Nursing note and vitals reviewed.    ED Treatments / Results  Labs (all labs ordered are listed, but only abnormal results are displayed) Labs Reviewed  CBC - Abnormal; Notable for the following:       Result Value   Platelets 107 (*)    All other components within normal limits  BASIC METABOLIC PANEL  Randolm Idol, ED    ED ECG REPORT   Date: 07/30/2016  Rate: 71  Rhythm: normal sinus rhythm  QRS Axis: normal  Intervals: normal  ST/T Wave abnormalities: normal  Conduction Disutrbances:none  Narrative Interpretation:   Old EKG Reviewed: unchanged  I have personally reviewed the EKG tracing and agree with the computerized printout as noted.   Radiology Dg Chest 2 View  Result Date: 07/30/2016 CLINICAL DATA:  Midsternal chest pain. EXAM: CHEST  2 VIEW COMPARISON:  None. FINDINGS: The heart size and mediastinal contours are within normal limits. Both lungs are clear. The visualized skeletal structures are unremarkable. IMPRESSION: No active cardiopulmonary disease. Electronically Signed   By: Dorise Bullion III M.D   On: 07/30/2016 13:54    Procedures Procedures (including critical care time)  Medications Ordered in ED Medications  nitroGLYCERIN (NITROSTAT) SL tablet 0.4 mg (not administered)     Initial Impression / Assessment and Plan / ED Course  I have reviewed the triage vital signs and the nursing notes.  Pertinent labs & imaging results that were available during my care of the patient were reviewed by me and considered in my medical decision making (see chart for  details).     Patient seen and examined. EKG without changes. NTG ordered. ASA previous. Discussed with Dr. Eulis Foster who will see.   Vital signs reviewed and are as follows: BP 133/70 (BP Location: Right Arm)   Pulse 71   Temp 97.6 F (36.4 C) (Oral)   Resp 16   Ht 6\' 1"  (1.854 m)   Wt 184 lb (83.5 kg)   SpO2 98% Comment: Simultaneous filing. User may not have seen previous data.  BMI 24.28 kg/m   HEART = 5.   3:06 PM Patient and wife updated on results. They agree to admission for observation at this time, given risk factors for CAD.  Final Clinical Impressions(s) / ED Diagnoses   Final diagnoses:  Precordial pain   Admit for CP obs.  New Prescriptions New Prescriptions   No medications on file     Carlisle Cater, Hershal Coria 07/30/16 1507    Daleen Bo, MD 07/30/16 1612    Daleen Bo, MD 07/30/16 [phone]

## 2016-07-30 NOTE — ED Provider Notes (Signed)
  Face-to-face evaluation   History: He presents for evaluation of chest pain, onset at rest, initially intermittent, then it became waxing and waning about 3 hours ago.  The pain is described as dull, radiating from the left epigastric region, to the left shoulder.  There is no associated diaphoresis, nausea or vomiting.  He feels weak.  States he had similar chest discomfort for a brief period of time 3 weeks ago when he was told that he had a "mini stroke".  His PCP is apparently doing an outpatient stroke evaluation, multiple test modalities.  The patient denies cough, shortness of breath, focal weakness or paresthesia at this time.  Physical exam: Alert elderly man.  Heart regular rate and rhythm no murmur lungs clear to auscultation.  Chest nontender to palpation.  Medical screening examination/treatment/procedure(s) were conducted as a shared visit with non-physician practitioner(s) and myself.  I personally evaluated the patient during the encounter   Daleen Bo, MD 07/30/16 [phone]

## 2016-07-30 NOTE — ED Notes (Signed)
Patient transported to X-ray 

## 2016-07-30 NOTE — ED Triage Notes (Signed)
Pt [place] via [facility] from [facility] after experiencing stabbing chest pain to the left side of chest. Per EMS, pt stated his pain was relieved by raising his arms above his head and also with belching. Pain upon EMS arrival 4/10 to left chest. Described as stabbing pain. Pt is alert and oriented x4. EMS gave 324 ASA en route without change in pain level. EMS v/s: 170/100, HR 75, Sp02 98% RA, R 20. 18g IV placed in left hand.

## 2016-07-31 ENCOUNTER — Telehealth: Payer: Self-pay | Admitting: *Deleted

## 2016-07-31 DIAGNOSIS — E785 Hyperlipidemia, unspecified: Secondary | ICD-10-CM | POA: Diagnosis not present

## 2016-07-31 DIAGNOSIS — D696 Thrombocytopenia, unspecified: Secondary | ICD-10-CM

## 2016-07-31 DIAGNOSIS — I10 Essential (primary) hypertension: Secondary | ICD-10-CM | POA: Diagnosis not present

## 2016-07-31 DIAGNOSIS — R079 Chest pain, unspecified: Secondary | ICD-10-CM

## 2016-07-31 MED ORDER — POLYETHYLENE GLYCOL 3350 17 G PO PACK: 17.0000 g | PACK | Freq: Every day | ORAL | Status: DC | PRN

## 2016-07-31 MED ORDER — PANTOPRAZOLE SODIUM 40 MG PO TBEC
40.0000 mg | DELAYED_RELEASE_TABLET | Freq: Every day | ORAL | 0 refills | Status: DC
Start: 2016-08-01 — End: 2016-12-11

## 2016-07-31 MED ORDER — PANTOPRAZOLE SODIUM 40 MG PO TBEC
40.0000 mg | DELAYED_RELEASE_TABLET | Freq: Every day | ORAL | Status: DC
  Administered 2016-07-31: 40 mg via ORAL
  Filled 2016-07-31: qty 1

## 2016-07-31 NOTE — Discharge Summary (Signed)
Physician Discharge Summary  Ronald Stafford HBZ:[id number] DOB: 03/10/1931 DOA: 07/30/2016  PCP: Binnie Rail, MD  Admit date: 07/30/2016 Discharge date: 07/31/2016   Recommendations for Outpatient Follow-Up:   1. Outpatient stress test 2. May need referral to GI if abdominal pain continues   Discharge Diagnosis:   Active Problems:   Hyperlipidemia   Essential hypertension   GERD   LOW BACK PAIN SYNDROME   Solitary pulmonary nodule   Prediabetes   Frequent headaches   Carotid arterial disease (HCC)   Chest pain   History of DVT (deep vein thrombosis)   TIA (transient ischemic attack)   Thrombocytopenia ([place])   Discharge disposition:  Home.  Discharge Condition: Improved.  Diet recommendation: Low sodium, heart healthy  Wound care: None.   History of Present Illness:   Ronald Stafford is a 83 y.o. male who presents with chest pain syndrome. Symptoms resolved before patient arrived to the emergency department. In the emergency department with me he did complain of some further chest pain and was offered nitroglycerin which he said he did not need day medication that the chest pain wasn't severe. Patient educated on the natural history of myocardial infarction and need to be chest pain-free. He then accepted medication. His echo examination is unremarkable. Patient did have a recent neurological event in April of this year he had a full outpatient neurological evaluation including echocardiogram and carotids. Patient will be observed in the hospital overnight we'll rule out myocardial infarction with serial enzymes and EKGs. May want to consider stress testing as an outpatient depending on patient and primary preference.   Hospital Course by Problem:   Chest pain syndrome, cardiac verus GI  HEART score 4.  -CE negative -no further chest pain -outpatient follow up  Hypertension BP 147/78   Pulse 72    Controlled. Patient took his morning meds  Continue home  anti-hypertensive medications    Hyperlipidemia. Most recent lipid panel shows cholesterol 122, HDL 43 LDL 66 triglycerides 63, total cholesterol - HDL ratio 2.8. Continue home statins   GERD, patient was on prn PPI,  Protonix daily - abdominal pain improved   Recent history of TIA on 06/2015, full outpatient workup, no stroke noted  On full ASA daily and statins . No unilateral weakness or other neuro deficiencies at this time . Recent diagnosis of  Mild 1-39 % bilateral carotid artery disease, normal 2 D echo. No symptoms at this time   Continue present therapy with ASA, PLavix  and lipitor daily  Continue Neurontin  Thrombocytopenia  -outpatient follow up   History of hyperglycemia, with normal values today  -A1C 1 year ago 5.8    Medical Consultants:    None.   Discharge Exam:   Vitals:   07/31/16 0557 07/31/16 1139  BP: 125/65 (!) 145/71  Pulse: 70 80  Resp: 15 18  Temp: 97.5 F (36.4 C) 98.6 F (37 C)   Vitals:   07/30/16 1700 07/30/16 1935 07/31/16 0557 07/31/16 1139  BP: (!) 158/58 (!) 131/54 125/65 (!) 145/71  Pulse: 69 67 70 80  Resp: 18 16 15 18   Temp: 97.8 F (36.6 C) 97.6 F (36.4 C) 97.5 F (36.4 C) 98.6 F (37 C)  TempSrc: Oral Oral Oral Oral  SpO2: 97% 95% 96% 99%  Weight: 81.5 kg (179 lb 9.6 oz)  81.3 kg (179 lb 3.2 oz)   Height: 6\' 2"  (1.88 m)       Gen:  NAD  The results of significant diagnostics from this hospitalization (including imaging, microbiology, ancillary and laboratory) are listed below for reference.     Procedures and Diagnostic Studies:   Dg Chest 2 View  Result Date: 07/30/2016 CLINICAL DATA:  Midsternal chest pain. EXAM: CHEST  2 VIEW COMPARISON:  None. FINDINGS: The heart size and mediastinal contours are within normal limits. Both lungs are clear. The visualized skeletal structures are unremarkable. IMPRESSION: No active cardiopulmonary disease. Electronically Signed   By: Dorise Bullion III M.D   On:  07/30/2016 13:54     Labs:   Basic Metabolic Panel:  Recent Labs Lab 07/30/16 1309  NA 139  K 3.7  CL 106  CO2 24  GLUCOSE 94  BUN 12  CREATININE 0.85  CALCIUM 9.5   GFR Estimated Creatinine Clearance: 75.7 mL/min (by C-G formula based on SCr of 0.85 mg/dL). Liver Function Tests: No results for input(s): AST, ALT, ALKPHOS, BILITOT, PROT, ALBUMIN in the last 168 hours. No results for input(s): LIPASE, AMYLASE in the last 168 hours. No results for input(s): AMMONIA in the last 168 hours. Coagulation profile No results for input(s): INR, PROTIME in the last 168 hours.  CBC:  Recent Labs Lab 07/30/16 1309  WBC 4.1  HGB 15.0  HCT 44.0  MCV 90.9  PLT 107*   Cardiac Enzymes:  Recent Labs Lab 07/30/16 1517 07/30/16 1815 07/30/16 2100  TROPONINI <0.03 <0.03 <0.03   BNP: Invalid input(s): POCBNP CBG: No results for input(s): GLUCAP in the last 168 hours. D-Dimer No results for input(s): DDIMER in the last 72 hours. Hgb A1c No results for input(s): HGBA1C in the last 72 hours. Lipid Profile No results for input(s): CHOL, HDL, LDLCALC, TRIG, CHOLHDL, LDLDIRECT in the last 72 hours. Thyroid function studies No results for input(s): TSH, T4TOTAL, T3FREE, THYROIDAB in the last 72 hours.  Invalid input(s): FREET3 Anemia work up No results for input(s): VITAMINB12, FOLATE, FERRITIN, TIBC, IRON, RETICCTPCT in the last 72 hours. Microbiology No results found for this or any previous visit (from the past 240 hour(s)).   Discharge Instructions:   Discharge Instructions    Diet - low sodium heart healthy    Complete by:  As directed    Discharge instructions    Complete by:  As directed    Continue protonix daily if still with pain after 1 week- will need referral to GI for possible EGD   Increase activity slowly    Complete by:  As directed      Allergies as of 07/31/2016      Reactions   Chicken Allergy Nausea And Vomiting   Digestive problems        Medication List    STOP taking these medications   omeprazole 20 MG capsule Commonly known as:  PRILOSEC     TAKE these medications   acetaminophen 325 MG tablet Commonly known as:  TYLENOL Take 650 mg by mouth every 6 (six) hours as needed for pain. Pain   amLODipine 5 MG tablet Commonly known as:  NORVASC Take 1 tablet (5 mg total) by mouth daily. What changed:  when to take this   atorvastatin 40 MG tablet Commonly known as:  LIPITOR Take 1 tablet (40 mg total) by mouth daily. What changed:  when to take this   clopidogrel 75 MG tablet Commonly known as:  PLAVIX Take 1 tablet (75 mg total) by mouth daily. What changed:  when to take this   Fish Oil 1000 MG Caps Take  1,000 mg by mouth every evening.   gabapentin 100 MG capsule Commonly known as:  NEURONTIN Take 1 capsule (100 mg total) by mouth at bedtime.   pantoprazole 40 MG tablet Commonly known as:  PROTONIX Take 1 tablet (40 mg total) by mouth daily. Start taking on:  08/01/2016   vitamin C 250 MG tablet Commonly known as:  ASCORBIC ACID Take 250 mg by mouth daily.      Follow-up Information    Binnie Rail, MD Follow up in 1 week(s).   Specialty:  Internal Medicine Contact information: [place] [address] [phone]            Time coordinating discharge: 35 min  Signed:  Rachelanne Whidby Alison Stalling   [facility] 07/31/2016, 12:16 PM

## 2016-07-31 NOTE — Care Management Obs Status (Signed)
[place] NOTIFICATION   Patient Details  Name: MINER KORAL MRN: [id number] Date of Birth: 1930/09/03   Medicare Observation Status Notification Given:  Yes    Zenon Mayo, RN 07/31/2016, 1:12 PM

## 2016-07-31 NOTE — Telephone Encounter (Signed)
Transition Care Management Follow-up Telephone Call  Date discharged? 07/31/16   How have you been since you were released from the hospital? Pt states he seem to be doing ok just came home today   Do you understand why you were in the hospital? YES   Do you understand the discharge instructions? YES   Where were you discharged to? Home   Items Reviewed:  Medications reviewed: YES  Allergies reviewed: YES  Dietary changes reviewed: NO Referrals reviewed: YES, ? If he need to see GI doctor. Inform once he see MD she will decide if referral is needed  Functional Questionnaire:   Activities of Daily Living (ADLs):   He states he are independent in the following: bathing and hygiene, feeding, continence, grooming, toileting and dressing States he require assistance with the following: ambulation   Any transportation issues/concerns? NO   Any patient concerns? NO   Confirmed importance and date/time of follow-up visits scheduled YES, appt 08/02/16  Provider Appointment booked with Dr. Quay Burow  Confirmed with patient if condition begins to worsen call PCP or go to the ER.  Patient was given the office number and encouraged to call back with question or concerns.  : YES

## 2016-07-31 NOTE — Progress Notes (Signed)
dcd left hand saline locked piv. Cath intact. Site unremarkable. Pressure dressing applied. Dc instructions reviewed with patient-vervalized understanding.

## 2016-08-02 ENCOUNTER — Encounter: Payer: Self-pay | Admitting: Internal Medicine

## 2016-08-02 ENCOUNTER — Ambulatory Visit (INDEPENDENT_AMBULATORY_CARE_PROVIDER_SITE_OTHER): Payer: Medicare Other | Admitting: Internal Medicine

## 2016-08-02 VITALS — BP 132/62 | HR 91 | Temp 97.9°F | Resp 16 | Wt 185.0 lb

## 2016-08-02 DIAGNOSIS — I10 Essential (primary) hypertension: Secondary | ICD-10-CM

## 2016-08-02 DIAGNOSIS — K219 Gastro-esophageal reflux disease without esophagitis: Secondary | ICD-10-CM

## 2016-08-02 DIAGNOSIS — R079 Chest pain, unspecified: Secondary | ICD-10-CM | POA: Diagnosis not present

## 2016-08-02 NOTE — Assessment & Plan Note (Addendum)
In-hospital workup negative for ACS Will pursue an outpatient stress test Continue Plavix daily Advised if chest pain does not go away he should go to the ED Continue statin

## 2016-08-02 NOTE — Progress Notes (Signed)
Subjective:    Patient ID: Ronald Stafford, male    DOB: 1930/08/04, 83 y.o.   MRN: [id number]  HPI The patient is here for follow up from the hospital.  Admitted 07/30/16 - 07/31/16  for chest pain.  Recommendations for Outpatient Follow-Up:   1. Outpatient stress test 2. May need referral to GI if abdominal pain continues   He went to the emergency roomAfter experiencing chest pain for 3 hours. The pain was radiating to the left epigastric region and the left shoulder. He similar chest pain 3 weeks ago, but this was more intense. He denied diaphoresis, nausea or vomiting, shortness of breath, weakness, vision changes, headaches or confusion.  His initial troponin was negative and EKG was normal sinus rhythm without evidence of acute coronary syndrome. His chest pain relieved after a full dose of aspirin. Chest x-ray was normal. He did have a recent echocardiogram that showed an EF of 55-60%. Admitted for rule out ACS, which she was. He did not experience any additional chest pain. He was discharged home. His PPI was changed to daily in the event that this was gastrointestinal.  He does have a hiatal hernia.    He is taking the protonix daily.  He does have some burping. He denies GERD.  He denies abdominal pain.   He is still having intermittent chest pain.  It is not related to activity or food.  He denies palpitations, leg edema and SOB.  He did do some raking and he did not have chest pain, but wanted to lay down - it made it him tired.     Medications and allergies reviewed with patient and updated if appropriate.  Patient Active Problem List   Diagnosis Date Noted  . Chest pain 07/30/2016  . History of DVT (deep vein thrombosis) 07/30/2016  . TIA (transient ischemic attack) 07/30/2016  . Thrombocytopenia ([place]) 07/30/2016  . Carotid arterial disease ([place]) 07/20/2016  . Right sided weakness 06/27/2016  . Blurry vision 06/27/2016  . Slurred speech 06/27/2016  . Cough 01/26/2016   . Left wrist pain 08/11/2015  . Frequent headaches 06/22/2015  . Prediabetes 02/11/2015  . Episodic paroxysmal hemicrania, not intractable 02/11/2015  . Occult blood positive stool 09/27/2014  . CAP (community acquired pneumonia) 09/25/2014  . Peroneal tendonitis of right lower extremity 01/12/2014  . Mass of right ankle 12/31/2013  . Right ankle instability 12/31/2013  . Radial head fracture, closed 09/18/2013  . Lateral epicondylitis of right elbow 09/16/2013  . Solitary pulmonary nodule 08/08/2012  . SPINAL STENOSIS, LUMBAR 04/06/2010  . LOW BACK PAIN SYNDROME 11/02/2009  . FOOT DROP, RIGHT 05/21/2008  . Other nonthrombocytopenic purpuras 10/21/2007  . Hyperlipidemia 12/25/2006  . Depression 12/25/2006  . Essential hypertension 12/25/2006  . GERD 12/25/2006  . PROSTATE CANCER, HX OF 12/25/2006  . POLYP, COLON 05/02/2006    Current Outpatient Prescriptions on File Prior to Visit  Medication Sig Dispense Refill  . acetaminophen (TYLENOL) 325 MG tablet Take 650 mg by mouth every 6 (six) hours as needed for pain. Pain    . amLODipine (NORVASC) 5 MG tablet Take 1 tablet (5 mg total) by mouth daily. (Patient taking differently: Take 5 mg by mouth every evening. ) 90 tablet 3  . atorvastatin (LIPITOR) 40 MG tablet Take 1 tablet (40 mg total) by mouth daily. (Patient taking differently: Take 40 mg by mouth every evening. ) 90 tablet 3  . clopidogrel (PLAVIX) 75 MG tablet Take 1 tablet (75 mg total)  by mouth daily. (Patient taking differently: Take 75 mg by mouth every evening. ) 90 tablet 3  . gabapentin (NEURONTIN) 100 MG capsule Take 1 capsule (100 mg total) by mouth at bedtime. 90 capsule 3  . Omega-3 Fatty Acids (FISH OIL) 1000 MG CAPS Take 1,000 mg by mouth every evening.     . pantoprazole (PROTONIX) 40 MG tablet Take 1 tablet (40 mg total) by mouth daily. 30 tablet 0  . vitamin C (ASCORBIC ACID) 250 MG tablet Take 250 mg by mouth daily.     No current facility-administered  medications on file prior to visit.     Past Medical History:  Diagnosis Date  . Cancer [facility]) 2004   prostate cancer, Dr. Risa Grill  . Colon polyps   . Deep venous thrombosis ([place]) 1997   Postop  . Depression   . GERD (gastroesophageal reflux disease)   . Hyperlipidemia   . Hypertension   . Shoulder fracture, left 02/23/2011   immobilized in sling , Dr Norris(no surgery)    Past Surgical History:  Procedure Laterality Date  . COLONOSCOPY  2006   Negative,Dr.Edwards  . colonoscopy with polypectomy      PMH of  . JOINT REPLACEMENT  1997, 2001   Total Hip replacement X 2  . LUMBAR SPINE SURGERY  09/28/2010   Dr Rolena Infante; for spinal stenosis  . MECKEL DIVERTICULUM EXCISION     Age 7  . PROSTATECTOMY  2004   w/ radiation, Dr Risa Grill  . VASECTOMY      Social History   Social History  . Marital status: Widowed    Spouse name: N/A  . Number of children: N/A  . Years of education: N/A   Social History Main Topics  . Smoking status: Former Smoker    Packs/day: 0.50    Years: 5.00    Types: Cigarettes    Quit date: 03/13/1988  . Smokeless tobacco: Former Systems developer     Comment: Quit at about age 34 / chewed tobacca x 1 year  . Alcohol use No  . Drug use: No  . Sexual activity: Not on file   Other Topics Concern  . Not on file   Social History Narrative   Lives alone; Wife passed 2012/11/29;   Died of ovarian cancer; breast cancer; other cancer;          Family History  Problem Relation Age of Onset  . Prostate cancer Father   . Testicular cancer Son     Review of Systems  Constitutional: Negative for fever.  Respiratory: Positive for cough (occ, has difficulty stopping). Negative for shortness of breath and wheezing.   Cardiovascular: Positive for chest pain. Negative for palpitations and leg swelling.  Gastrointestinal: Negative for abdominal pain, blood in stool, constipation, diarrhea and nausea.       No gerd, does burp a lot  Neurological: Positive for  light-headedness and headaches.       Objective:   Vitals:   08/02/16 1007  BP: 132/62  Pulse: 91  Resp: 16  Temp: 97.9 F (36.6 C)   Wt Readings from Last 3 Encounters:  08/02/16 185 lb (83.9 kg)  07/31/16 179 lb 3.2 oz (81.3 kg)  07/13/16 184 lb (83.5 kg)   Body mass index is 23.75 kg/m.   Physical Exam    Constitutional: Appears well-developed and well-nourished. No distress.  HENT:  Head: Normocephalic and atraumatic.  Neck: Neck supple. No tracheal deviation present. No thyromegaly present.  No cervical  lymphadenopathy Cardiovascular: Normal rate, regular rhythm and normal heart sounds.   No murmur heard. No carotid bruit .  No edema Pulmonary/Chest: chest wall non tender to palpation. Effort normal and breath sounds normal. No respiratory distress. No has no wheezes. No rales.  Abdomen: soft, non tender, non distended Skin: Skin is warm and dry. Not diaphoretic.  Psychiatric: Normal mood and affect. Behavior is normal.    DG Chest 2 View CLINICAL DATA:  Midsternal chest pain.  EXAM: CHEST  2 VIEW  COMPARISON:  None.  FINDINGS: The heart size and mediastinal contours are within normal limits. Both lungs are clear. The visualized skeletal structures are unremarkable.  IMPRESSION: No active cardiopulmonary disease.  Electronically Signed   By: Dorise Bullion III M.D   On: 07/30/2016 13:54   EKG and blood work reviewed   Assessment & Plan:    See Problem List for Assessment and Plan of chronic medical problems.

## 2016-08-02 NOTE — Assessment & Plan Note (Signed)
BP well controlled Current regimen effective and well tolerated Continue current medications at current doses  

## 2016-08-02 NOTE — Patient Instructions (Addendum)
A stress test was ordered - someone will call you to schedule this.   If you have have chest pain that does not resolve you should go to the ED.   Medications reviewed and updated.  No changes recommended at this time.   Please followup in 6 months, sooner if needed

## 2016-08-02 NOTE — Assessment & Plan Note (Addendum)
?   Controlled Omeprazole changed to pantoprazole - has taken it for two days Will continue pantoprazole

## 2016-08-10 ENCOUNTER — Telehealth: Payer: Self-pay | Admitting: Internal Medicine

## 2016-08-10 ENCOUNTER — Telehealth (HOSPITAL_COMMUNITY): Payer: Self-pay | Admitting: Internal Medicine

## 2016-08-10 DIAGNOSIS — R079 Chest pain, unspecified: Secondary | ICD-10-CM

## 2016-08-10 NOTE — Telephone Encounter (Signed)
Need clarification regarding stress echo that was ordered. After reviewing the pts chart, Rollene Fare said that the pt has physical abnormalities that would result in him not doing well on the treadmill stress test. Rollene Fare can be reached at [phone].

## 2016-08-10 NOTE — Telephone Encounter (Signed)
I contacted the office and spoke with Gareth Eagle, operator, and I asked to speak with Dr. Quay Burow or her nurse in regards to a test she ordered for the patient. I wanted to know if she indeed intended on ordering the Stress Echo as oppose the the Outpatient Stress test that was requested on her 5/23 office note. Gareth Eagle noted my concern and voiced that she would have Dr. Quay Burow' nurse to returned my call.

## 2016-08-10 NOTE — Telephone Encounter (Signed)
Will change to dobutamine - let patient know he will not need to exercise - they will inject medication.

## 2016-08-10 NOTE — Telephone Encounter (Signed)
Spoke with Rollene Fare at [facility] to inform order was changed, she stated that pt has appt scheduled on 08/28/16

## 2016-08-24 ENCOUNTER — Telehealth (HOSPITAL_COMMUNITY): Payer: Self-pay | Admitting: *Deleted

## 2016-08-24 NOTE — Telephone Encounter (Signed)
Left message on voicemail per DPR in reference to upcoming appointment scheduled on 08/28/16 at 2:30 with detailed instructions given per Stress Test Requisition Sheet for the test. LM to arrive 30 minutes early, and that it is imperative to arrive on time for appointment to keep from having the test rescheduled. If you need to cancel or reschedule your appointment, please call the office within 24 hours of your appointment. Failure to do so may result in a cancellation of your appointment, and a $50 no show fee. Phone number given for call back for any questions. Veronia Beets

## 2016-08-28 ENCOUNTER — Ambulatory Visit (HOSPITAL_COMMUNITY): Payer: Medicare Other

## 2016-08-28 ENCOUNTER — Other Ambulatory Visit (HOSPITAL_COMMUNITY): Payer: Medicare Other

## 2016-08-28 ENCOUNTER — Ambulatory Visit (HOSPITAL_COMMUNITY): Payer: Medicare Other | Attending: Cardiology

## 2016-08-28 DIAGNOSIS — R079 Chest pain, unspecified: Secondary | ICD-10-CM | POA: Insufficient documentation

## 2016-08-28 MED ORDER — SODIUM CHLORIDE 0.9 % IV SOLN
10.0000 ug/kg/min | INTRAVENOUS | Status: AC
  Administered 2016-08-28: 30 ug/kg/min via INTRAVENOUS

## 2016-10-04 ENCOUNTER — Ambulatory Visit (INDEPENDENT_AMBULATORY_CARE_PROVIDER_SITE_OTHER): Payer: Medicare Other | Admitting: Family Medicine

## 2016-10-04 ENCOUNTER — Encounter: Payer: Self-pay | Admitting: Family Medicine

## 2016-10-04 VITALS — BP 138/80 | HR 76 | Temp 97.8°F | Ht 74.0 in | Wt 186.2 lb

## 2016-10-04 DIAGNOSIS — M25512 Pain in left shoulder: Secondary | ICD-10-CM | POA: Diagnosis not present

## 2016-10-04 NOTE — Patient Instructions (Signed)
Thank you for coming in,   Please try the exercises at least 1 time per day.  If you do not have any improvement with this injection we can try an injection into a different space.    Please feel free to call with any questions or concerns at any time. --Dr. Raeford Razor

## 2016-10-04 NOTE — Assessment & Plan Note (Signed)
It appears that he has some subacromial bursitis and this was observed on ultrasound scan. He does have some concerns of capsular pain based on his exam. - Injection subacromial today. - If no improvement can consider glenohumeral injection. Would consider an x-ray as well as follow-up.  - Provided home exercises.

## 2016-10-04 NOTE — Progress Notes (Addendum)
Ronald Stafford - 83 y.o. male MRN [id number]  Date of birth: 03-Feb-1931  SUBJECTIVE:  Including CC & ROS.  Chief Complaint  Patient presents with  . Shoulder Pain    Left shoulder pain X2 weeks, puttting weight on it hurts tylenol helps. pain only accurs when using shoulder    Ronald Stafford is an 83 year old male that is presenting with left shoulder pain for about 2 weeks. He reports that he has increased exercising with an elliptical. He reports that the pain occurs in the lateral aspect of his shoulder and has some radiation to the elbow. He denies any specific injury. He does have a history of a humeral head fracture from 2012. He has taken Tylenol to help with the pain. The pain is worse when he is trying to push himself up from a seated position.    Review of Systems  Denies any fevers, chills, night sweats, shortness of breath, otherwise negative or see history of present illness  HISTORY: Past Medical, Surgical, Social, and Family History Reviewed & Updated per EMR.   Pertinent Historical Findings include:  Past Medical History:  Diagnosis Date  . Cancer [facility]) 2004   prostate cancer, Dr. Risa Grill  . Colon polyps   . Deep venous thrombosis ([place]) 1997   Postop  . Depression   . GERD (gastroesophageal reflux disease)   . Hyperlipidemia   . Hypertension   . Shoulder fracture, left 02/23/2011   immobilized in sling , Dr Norris(no surgery)    Allergies  Allergen Reactions  . Chicken Allergy Nausea And Vomiting    Digestive problems    Family History  Problem Relation Age of Onset  . Prostate cancer Father   . Testicular cancer Son      Social History   Social History  . Marital status: Widowed    Spouse name: N/A  . Number of children: N/A  . Years of education: N/A   Occupational History  . Not on file.   Social History Main Topics  . Smoking status: Former Smoker    Packs/day: 0.50    Years: 5.00    Types: Cigarettes    Quit date: 03/13/1988  . Smokeless tobacco:  Former Systems developer     Comment: Quit at about age 40 / chewed tobacca x 1 year  . Alcohol use No  . Drug use: No  . Sexual activity: Not on file   Other Topics Concern  . Not on file   Social History Narrative   Lives alone; Wife passed 12/14/12;   Died of ovarian cancer; breast cancer; other cancer;           PHYSICAL EXAM:  VS: BP 138/80 (BP Location: Left Arm, Patient Position: Sitting, Cuff Size: Normal)   Pulse 76   Temp 97.8 F (36.6 C) (Oral)   Ht 6\' 2"  (1.88 m)   Wt 186 lb 3.2 oz (84.5 kg)   SpO2 99%   BMI 23.91 kg/m  PHYSICAL EXAM: Gen: NAD, alert, cooperative with exam, well-appearing HEENT: clear conjunctiva, EOMI CV:  no edema, capillary refill brisk,  Resp: non-labored, normal speech Skin: no rashes, normal turgor  Neuro: no gross deficits.  Psych:  alert and oriented MSK: Left Shoulder:  Normal internal rotation. Normal external rotation and abduction. Normal active flexion and abduction. Some pain with external rotation and abduction. Some pain with empty can testing. No pain to palpation over the acromioclavicular joint. Normal grip strength. Neurovascularly intact   Aspiration/Injection Procedure Note  Rodell Marrs Wix [birth year]  Procedure: Injection Indications: left shoulder pain   Procedure Details Consent: Risks of procedure as well as the alternatives and risks of each were explained to the (patient/caregiver).  Consent for procedure obtained. Time Out: Verified patient identification, verified procedure, site/side was marked, verified correct patient position, special equipment/implants available, medications/allergies/relevent history reviewed, required imaging and test results available.  Performed.  The area was cleaned with iodine and alcohol swabs.    The left subacromial space was injected using 1 cc's of 40 mg Depomedrol and 4 cc's of 1% lidocaine with a 25 gauge 1" needle.     A sterile dressing was applied.  Patient did tolerate  procedure well.  ASSESSMENT & PLAN:   Acute pain of left shoulder It appears that he has some subacromial bursitis and this was observed on ultrasound scan. He does have some concerns of capsular pain based on his exam. - Injection subacromial today. - If no improvement can consider glenohumeral injection. Would consider an x-ray as well as follow-up.  - Provided home exercises.

## 2016-11-07 ENCOUNTER — Ambulatory Visit (INDEPENDENT_AMBULATORY_CARE_PROVIDER_SITE_OTHER): Payer: Medicare Other | Admitting: Family Medicine

## 2016-11-07 ENCOUNTER — Encounter: Payer: Self-pay | Admitting: Family Medicine

## 2016-11-07 VITALS — BP 138/70 | HR 95 | Temp 98.1°F | Ht 74.0 in | Wt 182.0 lb

## 2016-11-07 DIAGNOSIS — M25571 Pain in right ankle and joints of right foot: Secondary | ICD-10-CM | POA: Insufficient documentation

## 2016-11-07 MED ORDER — TRAMADOL HCL 50 MG PO TABS: 50.0000 mg | ORAL_TABLET | Freq: Three times a day (TID) | ORAL | 0 refills | Status: DC | PRN

## 2016-11-07 NOTE — Patient Instructions (Addendum)
Thank you for coming in,   Please try rubbing the Pennsaid on the area that hurts. Please try using ice.    Please feel free to call with any questions or concerns at any time, at [phone]. --Dr. Raeford Razor  Take tylenol 650 mg three times a day is the best evidence based medicine we have for arthritis.   Glucosamine sulfate 750mg  twice a day is a supplement that has been shown to help moderate to severe arthritis. Capsaicin topically up to four times a day may also help with pain.  Cortisone injections are an option if these interventions do not seem to make a difference or need more relief.   It's important that you continue to stay active.   Heat or ice 20 minutes at a time 3-4 times a day as needed to help with pain.  Come back and see me in 3 weeks

## 2016-11-07 NOTE — Assessment & Plan Note (Signed)
Acute in nature. He likely has exacerbation of underlying arthritis related to this injury sustained. No defect appreciated but some small fluid within the joint seen on ultrasound. He does have some excessive foot inversion will likely unrelated to the injury. - Tramadol provided - Sample of Pennsaid  - If no improvement consider an x-ray and/or ankle injection

## 2016-11-07 NOTE — Progress Notes (Signed)
Ronald Stafford - 83 y.o. male MRN [id number]  Date of birth: 1930/09/23  SUBJECTIVE:  Including CC & ROS.  Chief Complaint  Patient presents with  . Fall    right foot pain. Patient states he fell sunday night at church down one step. patient is wearing brace and states that helps. patient states he uses icepacks and tylenol which helps. it is not painful at the moment    Ronald Stafford is an 83 year old male that is presenting with right ankle pain. He reports he was at church and had an injury. He fell backwards into a seated position and had pain upon his ankle. The pain is occurring on the anterior aspect of his ankle. He reports the pain has been significant at night to where it is woke him up for a couple of hours. He had to take pain medication from a previous injury. The pain is localized in nature. He denies any bruising or swelling. He is having to use a cane for ambulation. He denies any redness.   I have independently reviewed a right ankle x-ray from 12/18/13 that is showing an os trigonum and some degenerative changes to the medial malleolus. I have independently reviewed an MRI of his right foot and ankle from 01/08/14 synovitis of the peroneal tendons.  Review of Systems  Cardiovascular: Negative for leg swelling.  Musculoskeletal: Positive for gait problem. Negative for joint swelling.  Skin: Negative for color change.  Neurological: Negative for weakness and numbness.  Hematological: Negative for adenopathy.  Otherwise negative   HISTORY: Past Medical, Surgical, Social, and Family History Reviewed & Updated per EMR.   Pertinent Historical Findings include:  Past Medical History:  Diagnosis Date  . Cancer [facility]) 2004   prostate cancer, Dr. Risa Grill  . Colon polyps   . Deep venous thrombosis ([place]) 1997   Postop  . Depression   . GERD (gastroesophageal reflux disease)   . Hyperlipidemia   . Hypertension   . Shoulder fracture, left 02/23/2011   immobilized in sling , Dr Norris(no  surgery)    Past Surgical History:  Procedure Laterality Date  . COLONOSCOPY  2006   Negative,Dr.Edwards  . colonoscopy with polypectomy      PMH of  . JOINT REPLACEMENT  1997, 2001   Total Hip replacement X 2  . LUMBAR SPINE SURGERY  09/28/2010   Dr Rolena Infante; for spinal stenosis  . MECKEL DIVERTICULUM EXCISION     Age 70  . PROSTATECTOMY  2004   w/ radiation, Dr Risa Grill  . VASECTOMY      Allergies  Allergen Reactions  . Chicken Allergy Nausea And Vomiting    Digestive problems    Family History  Problem Relation Age of Onset  . Prostate cancer Father   . Testicular cancer Son      Social History   Social History  . Marital status: Widowed    Spouse name: N/A  . Number of children: N/A  . Years of education: N/A   Occupational History  . Not on file.   Social History Main Topics  . Smoking status: Former Smoker    Packs/day: 0.50    Years: 5.00    Types: Cigarettes    Quit date: 03/13/1988  . Smokeless tobacco: Former Systems developer     Comment: Quit at about age 63 / chewed tobacca x 1 year  . Alcohol use No  . Drug use: No  . Sexual activity: Not on file   Other Topics Concern  .  Not on file   Social History Narrative   Lives alone; Wife passed 12-07-2012;   Died of ovarian cancer; breast cancer; other cancer;           PHYSICAL EXAM:  VS: BP 138/70 (BP Location: Left Arm, Patient Position: Sitting, Cuff Size: Normal)   Pulse 95   Temp 98.1 F (36.7 C) (Oral)   Ht 6\' 2"  (1.88 m)   Wt 182 lb (82.6 kg)   SpO2 98%   BMI 23.37 kg/m  Physical Exam Gen: NAD, alert, cooperative with exam, well-appearing ENT: normal lips, normal nasal mucosa,  Eye: normal EOM, normal conjunctiva and lids CV:  no edema, +2 pedal pulses   Resp: no accessory muscle use, non-labored,  Skin: no rashes, no areas of induration  Neuro: normal tone, normal sensation to touch Psych:  normal insight, alert and oriented MSK:  Right ankle: No overlying swelling or  ecchymosis. No tenderness to palpation over the lateral malleolus. Some tenderness to palpation over the anterior joint line. No tenderness palpation of the Achilles. Normal ankle range of motion. Normal strength. He is using a single point cane for ambulation  Neurovascularly intact  Limited ultrasound: Right ankle:  There is some mild joint effusion noted within the ankle. No defect within the talor dome. Arthritic changes seen at the lateral malleolus. Normal peroneal tendons  Summary: Findings consistent with arthritic changes  Ultrasound and interpretation by Clearance Coots, MD           ASSESSMENT & PLAN:   Acute right ankle pain Acute in nature. He likely has exacerbation of underlying arthritis related to this injury sustained. No defect appreciated but some small fluid within the joint seen on ultrasound. He does have some excessive foot inversion will likely unrelated to the injury. - Tramadol provided - Sample of Pennsaid  - If no improvement consider an x-ray and/or ankle injection

## 2016-11-09 ENCOUNTER — Other Ambulatory Visit: Payer: Self-pay | Admitting: Internal Medicine

## 2016-11-23 ENCOUNTER — Ambulatory Visit: Payer: Medicare Other | Admitting: Neurology

## 2016-12-05 ENCOUNTER — Telehealth: Payer: Self-pay | Admitting: Emergency Medicine

## 2016-12-05 NOTE — Telephone Encounter (Signed)
LVM for pt to call back and schedule appt for surgical clearance.

## 2016-12-10 DIAGNOSIS — M1612 Unilateral primary osteoarthritis, left hip: Secondary | ICD-10-CM | POA: Insufficient documentation

## 2016-12-10 NOTE — Progress Notes (Signed)
Subjective:    Patient ID: Ronald Stafford, male    DOB: Feb 25, 1931, 83 y.o.   MRN: [id number]  HPI The patient is here for follow up and surgical clearance.  Hypertension: He is taking his medication daily. He is compliant with a low sodium diet.  He denies chest pain, palpitations, edema, shortness of breath and regular headaches. He is exercising regularly - riding his stationary bike and doing arm exercises.    Hyperlipidemia: He is taking his medication daily. He is compliant with a low fat/cholesterol diet. He is exercising regularly. He denies myalgias.   GERD:  He is taking his medication daily as prescribed.  He denies any GERD symptoms and feels his GERD is well controlled.   Prediabetes:  He is compliant with a low sugar/carbohydrate diet.  He is exercising regularly.   Left hip pain:  He is scheduled for surgery on 12/5 by Dr Wynelle Link.  He has failed conservative treatment and surgery is the only treatment at this point that will likely help his pain.  He has tolerated anesthesia in the past without difficulty.  He denies bleeding problems.  He has had a blood clot in the past, that occurred in 1997 after he broke his left hip and waited two days before he was able to have surgery and was found to have a dvt post op.  He has not had any other blood clot problems.    Medications and allergies reviewed with patient and updated if appropriate.  Patient Active Problem List   Diagnosis Date Noted  . Osteoarthritis of left hip 12/10/2016  . Acute right ankle pain 11/07/2016  . Acute pain of left shoulder 10/04/2016  . Chest pain 07/30/2016  . History of DVT (deep vein thrombosis) 07/30/2016  . TIA (transient ischemic attack) 07/30/2016  . Thrombocytopenia ([place]) 07/30/2016  . Carotid arterial disease ([place]) 07/20/2016  . Right sided weakness 06/27/2016  . Blurry vision 06/27/2016  . Slurred speech 06/27/2016  . Cough 01/26/2016  . Left wrist pain 08/11/2015  . Frequent  headaches 06/22/2015  . Prediabetes 02/11/2015  . Episodic paroxysmal hemicrania, not intractable 02/11/2015  . Occult blood positive stool 09/27/2014  . CAP (community acquired pneumonia) 09/25/2014  . Peroneal tendonitis of right lower extremity 01/12/2014  . Mass of right ankle 12/31/2013  . Right ankle instability 12/31/2013  . Radial head fracture, closed 09/18/2013  . Lateral epicondylitis of right elbow 09/16/2013  . Solitary pulmonary nodule 08/08/2012  . SPINAL STENOSIS, LUMBAR 04/06/2010  . LOW BACK PAIN SYNDROME 11/02/2009  . FOOT DROP, RIGHT 05/21/2008  . Other nonthrombocytopenic purpuras 10/21/2007  . Hyperlipidemia 12/25/2006  . Depression 12/25/2006  . Essential hypertension 12/25/2006  . GERD 12/25/2006  . PROSTATE CANCER, HX OF 12/25/2006  . POLYP, COLON 05/02/2006    Current Outpatient Prescriptions on File Prior to Visit  Medication Sig Dispense Refill  . acetaminophen (TYLENOL) 325 MG tablet Take 650 mg by mouth every 6 (six) hours as needed for pain. Pain    . amLODipine (NORVASC) 5 MG tablet Take 1 tablet (5 mg total) by mouth daily. (Patient taking differently: Take 5 mg by mouth every evening. ) 90 tablet 3  . atorvastatin (LIPITOR) 40 MG tablet Take 1 tablet (40 mg total) by mouth daily. (Patient taking differently: Take 40 mg by mouth every evening. ) 90 tablet 3  . clopidogrel (PLAVIX) 75 MG tablet Take 1 tablet (75 mg total) by mouth daily. (Patient taking differently: Take 75  mg by mouth every evening. ) 90 tablet 3  . gabapentin (NEURONTIN) 100 MG capsule Take 1 capsule (100 mg total) by mouth at bedtime. -- Office visit needed for further refills 90 capsule 0  . Omega-3 Fatty Acids (FISH OIL) 1000 MG CAPS Take 1,000 mg by mouth every evening.     . traMADol (ULTRAM) 50 MG tablet Take 1 tablet (50 mg total) by mouth every 8 (eight) hours as needed. 20 tablet 0  . vitamin C (ASCORBIC ACID) 250 MG tablet Take 250 mg by mouth daily.     No current  facility-administered medications on file prior to visit.     Past Medical History:  Diagnosis Date  . Cancer [facility]) 2004   prostate cancer, Dr. Risa Grill  . Colon polyps   . Deep venous thrombosis ([place]) 1997   Postop  . Depression   . GERD (gastroesophageal reflux disease)   . Hyperlipidemia   . Hypertension   . Shoulder fracture, left 02/23/2011   immobilized in sling , Dr Norris(no surgery)    Past Surgical History:  Procedure Laterality Date  . COLONOSCOPY  2006   Negative,Dr.Edwards  . colonoscopy with polypectomy      PMH of  . JOINT REPLACEMENT  1997, 2001   Total Hip replacement X 2  . LUMBAR SPINE SURGERY  09/28/2010   Dr Rolena Infante; for spinal stenosis  . MECKEL DIVERTICULUM EXCISION     Age 48  . PROSTATECTOMY  2004   w/ radiation, Dr Risa Grill  . VASECTOMY      Social History   Social History  . Marital status: Widowed    Spouse name: N/A  . Number of children: N/A  . Years of education: N/A   Social History Main Topics  . Smoking status: Former Smoker    Packs/day: 0.50    Years: 5.00    Types: Cigarettes    Quit date: 03/13/1988  . Smokeless tobacco: Former Systems developer     Comment: Quit at about age 31 / chewed tobacca x 1 year  . Alcohol use No  . Drug use: No  . Sexual activity: Not Asked   Other Topics Concern  . None   Social History Narrative   Lives alone; Wife passed 2012-12-26;   Died of ovarian cancer; breast cancer; other cancer;          Family History  Problem Relation Age of Onset  . Prostate cancer Father   . Testicular cancer Son     Review of Systems  Constitutional: Negative for appetite change, chills, fever and unexpected weight change.  Eyes: Negative for visual disturbance (harder to read - needs new prescription).  Respiratory: Negative for cough, shortness of breath and wheezing.   Cardiovascular: Negative for chest pain, palpitations and leg swelling.  Gastrointestinal: Positive for constipation. Negative for abdominal  pain, blood in stool, diarrhea and nausea.       No gerd  Genitourinary: Negative for dysuria and hematuria.       Urinary incontinence  Musculoskeletal: Positive for arthralgias.  Skin: Negative for color change and rash.  Neurological: Negative for dizziness, light-headedness and headaches.  Psychiatric/Behavioral: Negative for dysphoric mood. The patient is not nervous/anxious.        Objective:   Vitals:   12/11/16 1536  BP: 128/74  Pulse: 94  Resp: 16  Temp: 98 F (36.7 C)  SpO2: 94%   Wt Readings from Last 3 Encounters:  12/11/16 186 lb (84.4 kg)  11/07/16  182 lb (82.6 kg)  10/04/16 186 lb 3.2 oz (84.5 kg)   Body mass index is 23.88 kg/m.   Physical Exam    Constitutional: He appears well-developed and well-nourished. No distress.  HENT:  Head: Normocephalic and atraumatic.  Right Ear: External ear normal.  Left Ear: External ear normal.  Mouth/Throat: Oropharynx is clear and moist.  Normal ear canals and TM b/l  Eyes: Conjunctivae and EOM are normal.  Neck: Neck supple. No tracheal deviation present. No thyromegaly present.  No carotid bruit  Cardiovascular: Normal rate, regular rhythm, normal heart sounds and intact distal pulses.   No murmur heard.  No edema Pulmonary/Chest: Effort normal and breath sounds normal. No respiratory distress. He has no wheezes. He has no rales.  Abdominal: Soft. He exhibits no distension. There is no tenderness.   Lymphadenopathy:   He has no cervical adenopathy.  Skin: Skin is warm and dry. He is not diaphoretic.  Psychiatric: He has a normal mood and affect. His behavior is normal.       Assessment & Plan:    See Problem List for Assessment and Plan of chronic medical problems.

## 2016-12-11 ENCOUNTER — Encounter: Payer: Self-pay | Admitting: Internal Medicine

## 2016-12-11 ENCOUNTER — Other Ambulatory Visit (INDEPENDENT_AMBULATORY_CARE_PROVIDER_SITE_OTHER): Payer: Medicare Other

## 2016-12-11 ENCOUNTER — Ambulatory Visit (INDEPENDENT_AMBULATORY_CARE_PROVIDER_SITE_OTHER): Payer: Medicare Other | Admitting: Internal Medicine

## 2016-12-11 VITALS — BP 128/74 | HR 94 | Temp 98.0°F | Resp 16 | Wt 186.0 lb

## 2016-12-11 DIAGNOSIS — I10 Essential (primary) hypertension: Secondary | ICD-10-CM | POA: Diagnosis not present

## 2016-12-11 DIAGNOSIS — Z01818 Encounter for other preprocedural examination: Secondary | ICD-10-CM | POA: Diagnosis not present

## 2016-12-11 DIAGNOSIS — E785 Hyperlipidemia, unspecified: Secondary | ICD-10-CM

## 2016-12-11 DIAGNOSIS — K219 Gastro-esophageal reflux disease without esophagitis: Secondary | ICD-10-CM

## 2016-12-11 DIAGNOSIS — M1612 Unilateral primary osteoarthritis, left hip: Secondary | ICD-10-CM

## 2016-12-11 DIAGNOSIS — R7303 Prediabetes: Secondary | ICD-10-CM

## 2016-12-11 LAB — CBC WITH DIFFERENTIAL/PLATELET
BASOS PCT: 0.9 % (ref 0.0–3.0)
Basophils Absolute: 0 10*3/uL (ref 0.0–0.1)
EOS ABS: 0.1 10*3/uL (ref 0.0–0.7)
Eosinophils Relative: 1.3 % (ref 0.0–5.0)
HCT: 47.1 % (ref 39.0–52.0)
HEMOGLOBIN: 15.9 g/dL (ref 13.0–17.0)
Lymphocytes Relative: 24.7 % (ref 12.0–46.0)
Lymphs Abs: 1.2 10*3/uL (ref 0.7–4.0)
MCHC: 33.7 g/dL (ref 30.0–36.0)
MCV: 94.5 fl (ref 78.0–100.0)
MONO ABS: 0.7 10*3/uL (ref 0.1–1.0)
Monocytes Relative: 13.1 % — ABNORMAL HIGH (ref 3.0–12.0)
NEUTROS PCT: 60 % (ref 43.0–77.0)
Neutro Abs: 3 10*3/uL (ref 1.4–7.7)
PLATELETS: 135 10*3/uL — AB (ref 150.0–400.0)
RBC: 4.99 Mil/uL (ref 4.22–5.81)
RDW: 14.3 % (ref 11.5–15.5)
WBC: 5 10*3/uL (ref 4.0–10.5)

## 2016-12-11 LAB — COMPREHENSIVE METABOLIC PANEL
ALT: 17 U/L (ref 0–53)
AST: 20 U/L (ref 0–37)
Albumin: 4.8 g/dL (ref 3.5–5.2)
Alkaline Phosphatase: 53 U/L (ref 39–117)
BUN: 15 mg/dL (ref 6–23)
CHLORIDE: 103 meq/L (ref 96–112)
CO2: 32 meq/L (ref 19–32)
CREATININE: 0.92 mg/dL (ref 0.40–1.50)
Calcium: 10.2 mg/dL (ref 8.4–10.5)
GFR: 83.29 mL/min (ref >60.00)
GLUCOSE: 94 mg/dL (ref 70–99)
Potassium: 4.7 mEq/L (ref 3.5–5.1)
SODIUM: 141 meq/L (ref 135–145)
Total Bilirubin: 1.1 mg/dL (ref 0.2–1.2)
Total Protein: 7.2 g/dL (ref 6.0–8.3)

## 2016-12-11 LAB — LIPID PANEL
CHOL/HDL RATIO: 3
Cholesterol: 146 mg/dL (ref 0–200)
HDL: 44.9 mg/dL (ref >39.00)
LDL CALC: 72 mg/dL (ref 0–99)
NONHDL: 100.6
Triglycerides: 144 mg/dL (ref 0.0–149.0)
VLDL: 28.8 mg/dL (ref 0.0–40.0)

## 2016-12-11 LAB — HEMOGLOBIN A1C: Hgb A1c MFr Bld: 5.8 % (ref 4.6–6.5)

## 2016-12-11 MED ORDER — ONE-A-DAY MENS PO TABS: 1.0000 | ORAL_TABLET | Freq: Every day | ORAL | 0 refills | Status: DC

## 2016-12-11 NOTE — Assessment & Plan Note (Signed)
Failed conservative treatment Will have hip replacement on 12/5 by Dr Wynelle Link Low risk for low risk surgery Will stop plavix and vitamins as instructed by ortho and restart when able dvt precautions Cleared for surgery - sent form to ortho

## 2016-12-11 NOTE — Assessment & Plan Note (Signed)
GERD controlled Continue daily medication  

## 2016-12-11 NOTE — Assessment & Plan Note (Signed)
Check a1c Low sugar / carb diet Stressed regular exercise   

## 2016-12-11 NOTE — Patient Instructions (Addendum)
  Test(s) ordered today. Your results will be released to MyChart (or called to you) after review, usually within 72hours after test completion. If any changes need to be made, you will be notified at that same time.  No immunizations administered today.   Medications reviewed and updated.  No changes recommended at this time.  Please followup in 6 months   

## 2016-12-11 NOTE — Assessment & Plan Note (Signed)
Check lipid panel  Continue daily statin Regular exercise and healthy diet encouraged  

## 2016-12-11 NOTE — Assessment & Plan Note (Signed)
Left hip replacement on 12/5 Failed conservative treatment Low risk for low risk surgery Will stop plavix and vitamins as instructed by ortho and restart when able dvt precautions Cleared for surgery - sent form to ortho

## 2016-12-11 NOTE — Assessment & Plan Note (Signed)
BP well controlled Current regimen effective and well tolerated Continue current medications at current doses cmp  

## 2016-12-11 NOTE — Telephone Encounter (Signed)
Pt seen on 12/11/2016

## 2017-01-11 ENCOUNTER — Ambulatory Visit: Payer: Self-pay | Admitting: Orthopedic Surgery

## 2017-01-27 ENCOUNTER — Ambulatory Visit: Payer: Self-pay | Admitting: Orthopedic Surgery

## 2017-01-27 NOTE — H&P (View-Only) (Signed)
Ronald Stafford DOB: Dec 22, 1930 Married / Language: English / Race: White Male Date of admission: February 14, 2017  Chief complaint: Polyethylene wear of left total hip History of Present Illness  The patient is a 83 year old male who comes in  for a preoperative History and Physical. The patient is scheduled for a left hip polyethylene revision versus total hip revision to be performed by Dr. Dione Plover. Aluisio, MD at [facility] on 02/14/2017. The patient is a 83 year old male who presented for follow up of their hip. The patient is being followed for their left hip pain and trochanteric bursitis. They are months out from troch bursa injection (hx THA revision 2001). Symptoms reported include: pain and aching. The patient feels that they are doing poorly. The following medication has been used for pain control: Tylenol. The patient presented following MRI. The patient has not gotten any relief of their symptoms with Cortisone injections. He continues with a significant pain in his groin and lateral hip. He also has some low back pain. He had MRI done which showed ostial lysis around the proximal femur around the acetabular component. He did not have a fluid collection. He had an MRI which is reviewed showing some ostial lysis iaround the acetabular component and proximal femur. There is no fluid collection. We discussed some revision surgery in detail including the procedure, risks and potential complications associated with it. We will plan on the liner revision and possible further revision if there is any loosening of the acetabular component. He is ready to go ahead and proceed with surgery. They have been treated conservatively in the past for the above stated problem and despite conservative measures, they continue to have progressive pain and severe functional limitations and dysfunction. They have failed non-operative management. It is felt that they would benefit from undergoing  revision total joint replacement. Risks and benefits of the procedure have been discussed with the patient and they elect to proceed with surgery. There are no active contraindications to surgery such as ongoing infection or rapidly progressive neurological disease.    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 quit about 30 years ago former smoker; smoke(d) 1/2 pack(s) per day Alcohol use  current drinker; drinks wine Children  2 Drug/Alcohol Rehab (Currently)  no Drug/Alcohol Rehab (Previously)  no Exercise  Exercises daily; does running / walking Illicit drug use  no Living situation  Lives alone. Marital status  Widowed. Pain Contract  no Tobacco / smoke exposure  no Post-Surgical Plans  Home Alone.  Medication History Amlodipine Active. Atorvastatin Active. Clopidogrel Bisulfate (75MG  Tablet, Oral) Active. Gabapentin (100MG  Capsule, Oral) Active. Tramadol Active. Tylenol (Oral) Specific strength unknown - Active. Omeprazole (20MG  Capsule DR, Oral) Active. Vitamin C Active.  Past Surgical History  Total Hip Replacement  left Prostatectomy; Abdominal  Spinal Surgery    Review of Systems  General Not Present- Chills, Fatigue, Fever, Memory Loss, Night Sweats, Weight  Gain and Weight Loss. Skin Not Present- Eczema, Hives, Itching, Lesions and Rash. HEENT Not  Present- Dentures, Double Vision, Headache, Hearing Loss, Tinnitus and Visual Loss. Respiratory Not Present- Allergies, Chronic Cough, Coughing up blood, Shortness of breath at rest and Shortness of breath with exertion. Cardiovascular Not Present- Chest Pain, Difficulty Breathing Lying Down, Murmur, Palpitations, Racing/skipping heartbeats and Swelling. Gastrointestinal Not Present- Abdominal Pain, Bloody Stool, Constipation, Diarrhea, Difficulty Swallowing, Heartburn, Jaundice, Loss of appetitie, Nausea and Vomiting. Male Genitourinary Present- Incontinence. Not Present- Blood in Urine, Discharge, Flank Pain, Painful Urination, Urgency, Urinary frequency, Urinary Retention, Urinating at Night and Weak urinary stream. Musculoskeletal Present- Joint Pain. Not Present- Back Pain, Joint Swelling, Morning Stiffness, Muscle Pain, Muscle Weakness and Spasms. Neurological Not Present- Blackout spells, Difficulty with balance, Dizziness, Paralysis, Tremor and Weakness. Psychiatric Not Present- Insomnia.  Vitals Weight: 180 lb Height: 74in Weight was reported by patient. Height was reported by patient. Body Surface Area: 2.08 m Body Mass Index: 23.11 kg/m  Pulse: 104 (Regular)  BP: 154/62 (Sitting, Right Arm, Standard)   Physical Exam General Mental Status -Alert, cooperative and good historian. General Appearance-pleasant, Not in acute distress. Orientation-Oriented X3. Build & Nutrition-Well nourished and Well developed.  Head and Neck Head-normocephalic, atraumatic . Neck Global Assessment - supple, no bruit auscultated on the right, no bruit auscultated on the left.  Eye Pupil - Bilateral-Regular, Round Motion - Bilateral-EOMI.  Chest and Lung Exam Auscultation Breath sounds - clear at anterior chest wall and clear at posterior chest wall. Adventitious sounds - No Adventitious sounds.  Cardiovascular Auscultation Rhythm - Regular rate and rhythm. Heart  Sounds - S1 WNL and S2 WNL. Murmurs & Other Heart Sounds - Auscultation of the heart reveals - No Murmurs.  Abdomen Palpation/Percussion Tenderness - Abdomen is non-tender to palpation. Rigidity (guarding) - Abdomen is soft. Auscultation Auscultation of the abdomen reveals - Bowel sounds normal.  Male Genitourinary Note: Not done, not pertinent to present illness   Musculoskeletal Spine/Ribs/Pelvis  Thoracic Spine: Inspection and Palpation - Examination reveals - mass or masses noted(soft tissue mass on the upper right posterior back area, about 5 cm x 5 cm located over the right lateral scapular region). Note: His LEFT hip to be flexed to 100 rotation 20 out of 30 abduct 30 with discomfort on range of motion. Is no trochanteric tenderness. He had an MRI which is reviewed today showing some ostial lysis iaround the acetabular component and proximal femur. There is no fluid collection.   Assessment & Plan Pain of left hip joint (M25.552)  Note:Surgical Plans: Left Hip Polyethylene Revision versus Left Total Hip Revision.  Disposition: Home  PCP: Dr. Billey Gosling - Patient has been seen preoperatively and felt to be stable for surgery. "Ok to stop Plavix as needed."  Topical TXA - Prostate Cancer  Anesthesia Issues: None  Patient was instructed on what medications to stop prior to surgery.  Signed electronically by Joelene Millin, III PA-C

## 2017-01-27 NOTE — H&P (Signed)
Ronald Stafford DOB: 17-Sep-1930 Married / Language: English / Race: White Male Date of admission: February 14, 2017  Chief complaint: Polyethylene wear of left total hip History of Present Illness  The patient is a 83 year old male who comes in  for a preoperative History and Physical. The patient is scheduled for a left hip polyethylene revision versus total hip revision to be performed by Dr. Dione Plover. Aluisio, MD at [facility] on 02/14/2017. The patient is a 83 year old male who presented for follow up of their hip. The patient is being followed for their left hip pain and trochanteric bursitis. They are months out from troch bursa injection (hx THA revision 2001). Symptoms reported include: pain and aching. The patient feels that they are doing poorly. The following medication has been used for pain control: Tylenol. The patient presented following MRI. The patient has not gotten any relief of their symptoms with Cortisone injections. He continues with a significant pain in his groin and lateral hip. He also has some low back pain. He had MRI done which showed ostial lysis around the proximal femur around the acetabular component. He did not have a fluid collection. He had an MRI which is reviewed showing some ostial lysis iaround the acetabular component and proximal femur. There is no fluid collection. We discussed some revision surgery in detail including the procedure, risks and potential complications associated with it. We will plan on the liner revision and possible further revision if there is any loosening of the acetabular component. He is ready to go ahead and proceed with surgery. They have been treated conservatively in the past for the above stated problem and despite conservative measures, they continue to have progressive pain and severe functional limitations and dysfunction. They have failed non-operative management. It is felt that they would benefit from undergoing  revision total joint replacement. Risks and benefits of the procedure have been discussed with the patient and they elect to proceed with surgery. There are no active contraindications to surgery such as ongoing infection or rapidly progressive neurological disease.    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 quit about 30 years ago former smoker; smoke(d) 1/2 pack(s) per day Alcohol use  current drinker; drinks wine Children  2 Drug/Alcohol Rehab (Currently)  no Drug/Alcohol Rehab (Previously)  no Exercise  Exercises daily; does running / walking Illicit drug use  no Living situation  Lives alone. Marital status  Widowed. Pain Contract  no Tobacco / smoke exposure  no Post-Surgical Plans  Home Alone.  Medication History Amlodipine Active. Atorvastatin Active. Clopidogrel Bisulfate (75MG  Tablet, Oral) Active. Gabapentin (100MG  Capsule, Oral) Active. Tramadol Active. Tylenol (Oral) Specific strength unknown - Active. Omeprazole (20MG  Capsule DR, Oral) Active. Vitamin C Active.  Past Surgical History  Total Hip Replacement  left Prostatectomy; Abdominal  Spinal Surgery    Review of Systems  General Not Present- Chills, Fatigue, Fever, Memory Loss, Night Sweats, Weight  Gain and Weight Loss. Skin Not Present- Eczema, Hives, Itching, Lesions and Rash. HEENT Not  Present- Dentures, Double Vision, Headache, Hearing Loss, Tinnitus and Visual Loss. Respiratory Not Present- Allergies, Chronic Cough, Coughing up blood, Shortness of breath at rest and Shortness of breath with exertion. Cardiovascular Not Present- Chest Pain, Difficulty Breathing Lying Down, Murmur, Palpitations, Racing/skipping heartbeats and Swelling. Gastrointestinal Not Present- Abdominal Pain, Bloody Stool, Constipation, Diarrhea, Difficulty Swallowing, Heartburn, Jaundice, Loss of appetitie, Nausea and Vomiting. Male Genitourinary Present- Incontinence. Not Present- Blood in Urine, Discharge, Flank Pain, Painful Urination, Urgency, Urinary frequency, Urinary Retention, Urinating at Night and Weak urinary stream. Musculoskeletal Present- Joint Pain. Not Present- Back Pain, Joint Swelling, Morning Stiffness, Muscle Pain, Muscle Weakness and Spasms. Neurological Not Present- Blackout spells, Difficulty with balance, Dizziness, Paralysis, Tremor and Weakness. Psychiatric Not Present- Insomnia.  Vitals Weight: 180 lb Height: 74in Weight was reported by patient. Height was reported by patient. Body Surface Area: 2.08 m Body Mass Index: 23.11 kg/m  Pulse: 104 (Regular)  BP: 154/62 (Sitting, Right Arm, Standard)   Physical Exam General Mental Status -Alert, cooperative and good historian. General Appearance-pleasant, Not in acute distress. Orientation-Oriented X3. Build & Nutrition-Well nourished and Well developed.  Head and Neck Head-normocephalic, atraumatic . Neck Global Assessment - supple, no bruit auscultated on the right, no bruit auscultated on the left.  Eye Pupil - Bilateral-Regular, Round Motion - Bilateral-EOMI.  Chest and Lung Exam Auscultation Breath sounds - clear at anterior chest wall and clear at posterior chest wall. Adventitious sounds - No Adventitious sounds.  Cardiovascular Auscultation Rhythm - Regular rate and rhythm. Heart  Sounds - S1 WNL and S2 WNL. Murmurs & Other Heart Sounds - Auscultation of the heart reveals - No Murmurs.  Abdomen Palpation/Percussion Tenderness - Abdomen is non-tender to palpation. Rigidity (guarding) - Abdomen is soft. Auscultation Auscultation of the abdomen reveals - Bowel sounds normal.  Male Genitourinary Note: Not done, not pertinent to present illness   Musculoskeletal Spine/Ribs/Pelvis  Thoracic Spine: Inspection and Palpation - Examination reveals - mass or masses noted(soft tissue mass on the upper right posterior back area, about 5 cm x 5 cm located over the right lateral scapular region). Note: His LEFT hip to be flexed to 100 rotation 20 out of 30 abduct 30 with discomfort on range of motion. Is no trochanteric tenderness. He had an MRI which is reviewed today showing some ostial lysis iaround the acetabular component and proximal femur. There is no fluid collection.   Assessment & Plan Pain of left hip joint (M25.552)  Note:Surgical Plans: Left Hip Polyethylene Revision versus Left Total Hip Revision.  Disposition: Home  PCP: Dr. Billey Gosling - Patient has been seen preoperatively and felt to be stable for surgery. "Ok to stop Plavix as needed."  Topical TXA - Prostate Cancer  Anesthesia Issues: None  Patient was instructed on what medications to stop prior to surgery.  Signed electronically by Joelene Millin, III PA-C

## 2017-02-06 ENCOUNTER — Ambulatory Visit: Payer: Medicare Other | Admitting: Internal Medicine

## 2017-02-08 NOTE — Patient Instructions (Addendum)
Ronald Stafford  02/08/2017   Your procedure is scheduled on: 02-14-17  Report to [facility]  Entrance Report to Admitting at 9:30 AM   Call this number if you have problems the morning of surgery  [phone]   Remember: ONLY 1 PERSON MAY GO WITH YOU TO SHORT STAY TO GET  READY MORNING OF YOUR SURGERY.  Do not eat food or drink liquids :After Midnight.     Take these medicines the morning of surgery with A SIP OF WATER: Amlodipine (Norvasc), and Atorvastatin (Lipitor)                                You may not have any metal on your body including hair pins and              piercings  Do not wear jewelry, lotions, powders , or deodorant             Men may shave face and neck.   Do not bring valuables to the hospital. [place].  Contacts, dentures or bridgework may not be worn into surgery.  Leave suitcase in the car. After surgery it may be brought to your room.               Please read over the following fact sheets you were given: _____________________________________________________________________             [facility] - Preparing for Surgery Before surgery, you can play an important role.  Because skin is not sterile, your skin needs to be as free of germs as possible.  You can reduce the number of germs on your skin by washing with CHG (chlorahexidine gluconate) soap before surgery.  CHG is an antiseptic cleaner which kills germs and bonds with the skin to continue killing germs even after washing. Please DO NOT use if you have an allergy to CHG or antibacterial soaps.  If your skin becomes reddened/irritated stop using the CHG and inform your nurse when you arrive at Short Stay. Do not shave (including legs and underarms) for at least 48 hours prior to the first CHG shower.  You may shave your face/neck. Please follow these instructions carefully:  1.  Shower with CHG Soap the night before  surgery and the  morning of Surgery.  2.  If you choose to wash your hair, wash your hair first as usual with your  normal  shampoo.  3.  After you shampoo, rinse your hair and body thoroughly to remove the  shampoo.                           4.  Use CHG as you would any other liquid soap.  You can apply chg directly  to the skin and wash                       Gently with a scrungie or clean washcloth.  5.  Apply the CHG Soap to your body ONLY FROM THE NECK DOWN.   Do not use on face/ open  Wound or open sores. Avoid contact with eyes, ears mouth and genitals (private parts).                       Wash face,  Genitals (private parts) with your normal soap.             6.  Wash thoroughly, paying special attention to the area where your surgery  will be performed.  7.  Thoroughly rinse your body with warm water from the neck down.  8.  DO NOT shower/wash with your normal soap after using and rinsing off  the CHG Soap.                9.  Pat yourself dry with a clean towel.            10.  Wear clean pajamas.            11.  Place clean sheets on your bed the night of your first shower and do not  sleep with pets. Day of Surgery : Do not apply any lotions/deodorants the morning of surgery.  Please wear clean clothes to the hospital/surgery center.  FAILURE TO FOLLOW THESE INSTRUCTIONS MAY RESULT IN THE CANCELLATION OF YOUR SURGERY PATIENT SIGNATURE_________________________________  NURSE SIGNATURE__________________________________  ________________________________________________________________________   Adam Phenix  An incentive spirometer is a tool that can help keep your lungs clear and active. This tool measures how well you are filling your lungs with each breath. Taking long deep breaths may help reverse or decrease the chance of developing breathing (pulmonary) problems (especially infection) following:  A long period of time when you are unable to  move or be active. BEFORE THE PROCEDURE   If the spirometer includes an indicator to show your best effort, your nurse or respiratory therapist will set it to a desired goal.  If possible, sit up straight or lean slightly forward. Try not to slouch.  Hold the incentive spirometer in an upright position. INSTRUCTIONS FOR USE  1. Sit on the edge of your bed if possible, or sit up as far as you can in bed or on a chair. 2. Hold the incentive spirometer in an upright position. 3. Breathe out normally. 4. Place the mouthpiece in your mouth and seal your lips tightly around it. 5. Breathe in slowly and as deeply as possible, raising the piston or the ball toward the top of the column. 6. Hold your breath for 3-5 seconds or for as long as possible. Allow the piston or ball to fall to the bottom of the column. 7. Remove the mouthpiece from your mouth and breathe out normally. 8. Rest for a few seconds and repeat Steps 1 through 7 at least 10 times every 1-2 hours when you are awake. Take your time and take a few normal breaths between deep breaths. 9. The spirometer may include an indicator to show your best effort. Use the indicator as a goal to work toward during each repetition. 10. After each set of 10 deep breaths, practice coughing to be sure your lungs are clear. If you have an incision (the cut made at the time of surgery), support your incision when coughing by placing a pillow or rolled up towels firmly against it. Once you are able to get out of bed, walk around indoors and cough well. You may stop using the incentive spirometer when instructed by your caregiver.  RISKS AND COMPLICATIONS  Take your time so you do not get  dizzy or light-headed.  If you are in pain, you may need to take or ask for pain medication before doing incentive spirometry. It is harder to take a deep breath if you are having pain. AFTER USE  Rest and breathe slowly and easily.  It can be helpful to keep track of  a log of your progress. Your caregiver can provide you with a simple table to help with this. If you are using the spirometer at home, follow these instructions: [place] IF:   You are having difficultly using the spirometer.  You have trouble using the spirometer as often as instructed.  Your pain medication is not giving enough relief while using the spirometer.  You develop fever of 100.5 F (38.1 C) or higher. SEEK IMMEDIATE MEDICAL CARE IF:   You cough up bloody sputum that had not been present before.  You develop fever of 102 F (38.9 C) or greater.  You develop worsening pain at or near the incision site. MAKE SURE YOU:   Understand these instructions.  Will watch your condition.  Will get help right away if you are not doing well or get worse. Document Released: 07/10/2006 Document Revised: 05/22/2011 Document Reviewed: 09/10/2006 ExitCare Patient Information 2014 ExitCare, [place].   ________________________________________________________________________  WHAT IS A BLOOD TRANSFUSION? Blood Transfusion Information  A transfusion is the replacement of blood or some of its parts. Blood is made up of multiple cells which provide different functions.  Red blood cells carry oxygen and are used for blood loss replacement.  White blood cells fight against infection.  Platelets control bleeding.  Plasma helps clot blood.  Other blood products are available for specialized needs, such as hemophilia or other clotting disorders. BEFORE THE TRANSFUSION  Who gives blood for transfusions?   Healthy volunteers who are fully evaluated to make sure their blood is safe. This is blood bank blood. Transfusion therapy is the safest it has ever been in the practice of medicine. Before blood is taken from a donor, a complete history is taken to make sure that person has no history of diseases nor engages in risky social behavior (examples are intravenous drug use or sexual  activity with multiple partners). The donor's travel history is screened to minimize risk of transmitting infections, such as malaria. The donated blood is tested for signs of infectious diseases, such as HIV and hepatitis. The blood is then tested to be sure it is compatible with you in order to minimize the chance of a transfusion reaction. If you or a relative donates blood, this is often done in anticipation of surgery and is not appropriate for emergency situations. It takes many days to process the donated blood. RISKS AND COMPLICATIONS Although transfusion therapy is very safe and saves many lives, the main dangers of transfusion include:   Getting an infectious disease.  Developing a transfusion reaction. This is an allergic reaction to something in the blood you were given. Every precaution is taken to prevent this. The decision to have a blood transfusion has been considered carefully by your caregiver before blood is given. Blood is not given unless the benefits outweigh the risks. AFTER THE TRANSFUSION  Right after receiving a blood transfusion, you will usually feel much better and more energetic. This is especially true if your red blood cells have gotten low (anemic). The transfusion raises the level of the red blood cells which carry oxygen, and this usually causes an energy increase.  The nurse administering the transfusion will  monitor you carefully for complications. HOME CARE INSTRUCTIONS  No special instructions are needed after a transfusion. You may find your energy is better. Speak with your caregiver about any limitations on activity for underlying diseases you may have. SEEK MEDICAL CARE IF:   Your condition is not improving after your transfusion.  You develop redness or irritation at the intravenous (IV) site. SEEK IMMEDIATE MEDICAL CARE IF:  Any of the following symptoms occur over the next 12 hours:  Shaking chills.  You have a temperature by mouth above 102 F  (38.9 C), not controlled by medicine.  Chest, back, or muscle pain.  People around you feel you are not acting correctly or are confused.  Shortness of breath or difficulty breathing.  Dizziness and fainting.  You get a rash or develop hives.  You have a decrease in urine output.  Your urine turns a dark color or changes to pink, red, or brown. Any of the following symptoms occur over the next 10 days:  You have a temperature by mouth above 102 F (38.9 C), not controlled by medicine.  Shortness of breath.  Weakness after normal activity.  The white part of the eye turns yellow (jaundice).  You have a decrease in the amount of urine or are urinating less often.  Your urine turns a dark color or changes to pink, red, or brown. Document Released: 02/25/2000 Document Revised: 05/22/2011 Document Reviewed: 10/14/2007 [facility] Patient Information 2014 [place], [place].  _______________________________________________________________________

## 2017-02-08 NOTE — Progress Notes (Signed)
12-11-16 Surgical clearance on chart from Dr. Quay Burow  12-11-16 (Epic) HGA1C  08-28-16 (Epic) Stress  07-31-16 (Epic) EKG  07-26-16 (Epic) ECHO  07-12-16 (Epic) CXR

## 2017-02-09 ENCOUNTER — Encounter (HOSPITAL_COMMUNITY)
Admission: RE | Admit: 2017-02-09 | Discharge: 2017-02-09 | Disposition: A | Discharge status: Home / Self Care | Payer: Medicare Other | Source: Ambulatory Visit | Attending: Orthopedic Surgery | Admitting: Orthopedic Surgery

## 2017-02-09 ENCOUNTER — Encounter (HOSPITAL_COMMUNITY): Payer: Self-pay

## 2017-02-09 ENCOUNTER — Other Ambulatory Visit: Payer: Self-pay

## 2017-02-09 DIAGNOSIS — Z7902 Long term (current) use of antithrombotics/antiplatelets: Secondary | ICD-10-CM | POA: Diagnosis not present

## 2017-02-09 DIAGNOSIS — I10 Essential (primary) hypertension: Secondary | ICD-10-CM | POA: Insufficient documentation

## 2017-02-09 DIAGNOSIS — Z79899 Other long term (current) drug therapy: Secondary | ICD-10-CM | POA: Insufficient documentation

## 2017-02-09 DIAGNOSIS — T84061A Wear of articular bearing surface of internal prosthetic left hip joint, initial encounter: Secondary | ICD-10-CM | POA: Insufficient documentation

## 2017-02-09 DIAGNOSIS — F329 Major depressive disorder, single episode, unspecified: Secondary | ICD-10-CM | POA: Diagnosis not present

## 2017-02-09 DIAGNOSIS — Z8546 Personal history of malignant neoplasm of prostate: Secondary | ICD-10-CM | POA: Diagnosis not present

## 2017-02-09 DIAGNOSIS — Z01818 Encounter for other preprocedural examination: Secondary | ICD-10-CM | POA: Insufficient documentation

## 2017-02-09 DIAGNOSIS — Y838 Other surgical procedures as the cause of abnormal reaction of the patient, or of later complication, without mention of misadventure at the time of the procedure: Secondary | ICD-10-CM | POA: Insufficient documentation

## 2017-02-09 DIAGNOSIS — R7303 Prediabetes: Secondary | ICD-10-CM | POA: Insufficient documentation

## 2017-02-09 DIAGNOSIS — Z7982 Long term (current) use of aspirin: Secondary | ICD-10-CM | POA: Insufficient documentation

## 2017-02-09 DIAGNOSIS — K219 Gastro-esophageal reflux disease without esophagitis: Secondary | ICD-10-CM | POA: Diagnosis not present

## 2017-02-09 DIAGNOSIS — E785 Hyperlipidemia, unspecified: Secondary | ICD-10-CM | POA: Diagnosis not present

## 2017-02-09 LAB — SURGICAL PCR SCREEN
MRSA, PCR: NEGATIVE
STAPHYLOCOCCUS AUREUS: NEGATIVE

## 2017-02-09 LAB — CBC
HCT: 43.8 % (ref 39.0–52.0)
Hemoglobin: 14.6 g/dL (ref 13.0–17.0)
MCH: 31.6 pg (ref 26.0–34.0)
MCHC: 33.3 g/dL (ref 30.0–36.0)
MCV: 94.8 fL (ref 78.0–100.0)
PLATELETS: 127 10*3/uL — AB (ref 150–400)
RBC: 4.62 MIL/uL (ref 4.22–5.81)
RDW: 13.9 % (ref 11.5–15.5)
WBC: 4.9 10*3/uL (ref 4.0–10.5)

## 2017-02-09 LAB — COMPREHENSIVE METABOLIC PANEL
ALBUMIN: 4.4 g/dL (ref 3.5–5.0)
ALT: 27 U/L (ref 17–63)
AST: 33 U/L (ref 15–41)
Alkaline Phosphatase: 60 U/L (ref 38–126)
Anion gap: 6 (ref 5–15)
BUN: 15 mg/dL (ref 6–20)
CHLORIDE: 106 mmol/L (ref 101–111)
CO2: 29 mmol/L (ref 22–32)
Calcium: 9.7 mg/dL (ref 8.9–10.3)
Creatinine, Ser: 0.86 mg/dL (ref 0.61–1.24)
GFR calc Af Amer: >60 mL/min (ref >60)
Glucose, Bld: 100 mg/dL — ABNORMAL HIGH (ref 65–99)
POTASSIUM: 4.5 mmol/L (ref 3.5–5.1)
Sodium: 141 mmol/L (ref 135–145)
Total Bilirubin: 0.8 mg/dL (ref 0.3–1.2)
Total Protein: 7 g/dL (ref 6.5–8.1)

## 2017-02-09 LAB — PROTIME-INR
INR: 0.98
PROTHROMBIN TIME: 12.8 s (ref 11.4–15.2)

## 2017-02-09 LAB — APTT: APTT: 34 s (ref 24–36)

## 2017-02-09 NOTE — Progress Notes (Signed)
02-09-17  CBC result routed to Dr. Wynelle Link for review.

## 2017-02-14 ENCOUNTER — Encounter (HOSPITAL_COMMUNITY)
Admission: RE | Disposition: A | Discharge status: Home Health | Payer: Self-pay | Source: Ambulatory Visit | Attending: Orthopedic Surgery

## 2017-02-14 ENCOUNTER — Other Ambulatory Visit: Payer: Self-pay

## 2017-02-14 ENCOUNTER — Inpatient Hospital Stay (HOSPITAL_COMMUNITY): Payer: Medicare Other | Admitting: Anesthesiology

## 2017-02-14 ENCOUNTER — Inpatient Hospital Stay (HOSPITAL_COMMUNITY): Payer: Medicare Other

## 2017-02-14 ENCOUNTER — Inpatient Hospital Stay (HOSPITAL_COMMUNITY)
Admission: RE | Admit: 2017-02-14 | Discharge: 2017-02-15 | DRG: 468 | Disposition: A | Discharge status: Home Health | Payer: Medicare Other | Source: Ambulatory Visit | Attending: Orthopedic Surgery | Admitting: Orthopedic Surgery

## 2017-02-14 ENCOUNTER — Encounter (HOSPITAL_COMMUNITY): Payer: Self-pay | Admitting: *Deleted

## 2017-02-14 DIAGNOSIS — Z86718 Personal history of other venous thrombosis and embolism: Secondary | ICD-10-CM | POA: Diagnosis not present

## 2017-02-14 DIAGNOSIS — Z8546 Personal history of malignant neoplasm of prostate: Secondary | ICD-10-CM

## 2017-02-14 DIAGNOSIS — E785 Hyperlipidemia, unspecified: Secondary | ICD-10-CM | POA: Diagnosis present

## 2017-02-14 DIAGNOSIS — T84018A Broken internal joint prosthesis, other site, initial encounter: Secondary | ICD-10-CM

## 2017-02-14 DIAGNOSIS — T8484XA Pain due to internal orthopedic prosthetic devices, implants and grafts, initial encounter: Secondary | ICD-10-CM | POA: Diagnosis present

## 2017-02-14 DIAGNOSIS — Y831 Surgical operation with implant of artificial internal device as the cause of abnormal reaction of the patient, or of later complication, without mention of misadventure at the time of the procedure: Secondary | ICD-10-CM | POA: Diagnosis present

## 2017-02-14 DIAGNOSIS — I10 Essential (primary) hypertension: Secondary | ICD-10-CM | POA: Diagnosis present

## 2017-02-14 DIAGNOSIS — K219 Gastro-esophageal reflux disease without esophagitis: Secondary | ICD-10-CM | POA: Diagnosis present

## 2017-02-14 DIAGNOSIS — Z87891 Personal history of nicotine dependence: Secondary | ICD-10-CM

## 2017-02-14 DIAGNOSIS — Z96649 Presence of unspecified artificial hip joint: Secondary | ICD-10-CM

## 2017-02-14 HISTORY — PX: TOTAL HIP REVISION: SHX763

## 2017-02-14 LAB — TYPE AND SCREEN
ABO/RH(D): A POS
ANTIBODY SCREEN: NEGATIVE

## 2017-02-14 SURGERY — TOTAL HIP REVISION
Anesthesia: General | Site: Hip | Laterality: Left

## 2017-02-14 MED ORDER — METOCLOPRAMIDE HCL 5 MG/ML IJ SOLN: 5.0000 mg | Freq: Three times a day (TID) | INTRAMUSCULAR | Status: DC | PRN

## 2017-02-14 MED ORDER — ROCURONIUM BROMIDE 10 MG/ML (PF) SYRINGE
PREFILLED_SYRINGE | INTRAVENOUS | Status: DC | PRN
  Administered 2017-02-14: 50 mg via INTRAVENOUS

## 2017-02-14 MED ORDER — DEXTROSE 5 % IV SOLN
500.0000 mg | Freq: Four times a day (QID) | INTRAVENOUS | Status: DC | PRN
  Filled 2017-02-14: qty 5

## 2017-02-14 MED ORDER — PHENYLEPHRINE 40 MCG/ML (10ML) SYRINGE FOR IV PUSH (FOR BLOOD PRESSURE SUPPORT)
PREFILLED_SYRINGE | INTRAVENOUS | Status: DC | PRN
  Administered 2017-02-14 (×5): 80 ug via INTRAVENOUS

## 2017-02-14 MED ORDER — DEXTROSE 5 % IV SOLN
INTRAVENOUS | Status: DC | PRN
  Administered 2017-02-14: 40 ug/min via INTRAVENOUS

## 2017-02-14 MED ORDER — CHLORHEXIDINE GLUCONATE 4 % EX LIQD: 60.0000 mL | Freq: Once | CUTANEOUS | Status: DC

## 2017-02-14 MED ORDER — ONDANSETRON HCL 4 MG/2ML IJ SOLN: 4.0000 mg | Freq: Four times a day (QID) | INTRAMUSCULAR | Status: DC | PRN

## 2017-02-14 MED ORDER — PHENYLEPHRINE 40 MCG/ML (10ML) SYRINGE FOR IV PUSH (FOR BLOOD PRESSURE SUPPORT)
PREFILLED_SYRINGE | INTRAVENOUS | Status: AC
  Filled 2017-02-14: qty 10

## 2017-02-14 MED ORDER — MIDAZOLAM HCL 2 MG/2ML IJ SOLN
INTRAMUSCULAR | Status: AC
  Filled 2017-02-14: qty 2

## 2017-02-14 MED ORDER — ACETAMINOPHEN 10 MG/ML IV SOLN
INTRAVENOUS | Status: AC
  Filled 2017-02-14: qty 100

## 2017-02-14 MED ORDER — PROPOFOL 10 MG/ML IV BOLUS
INTRAVENOUS | Status: AC
  Filled 2017-02-14: qty 60

## 2017-02-14 MED ORDER — TRANEXAMIC ACID 1000 MG/10ML IV SOLN
INTRAVENOUS | Status: AC | PRN
  Administered 2017-02-14: 2000 mg via TOPICAL

## 2017-02-14 MED ORDER — DEXAMETHASONE SODIUM PHOSPHATE 10 MG/ML IJ SOLN
INTRAMUSCULAR | Status: AC
  Filled 2017-02-14: qty 1

## 2017-02-14 MED ORDER — PHENOL 1.4 % MT LIQD: 1.0000 | OROMUCOSAL | Status: DC | PRN

## 2017-02-14 MED ORDER — LIDOCAINE 2% (20 MG/ML) 5 ML SYRINGE
INTRAMUSCULAR | Status: DC | PRN
  Administered 2017-02-14: 100 mg via INTRAVENOUS

## 2017-02-14 MED ORDER — CEFAZOLIN SODIUM-DEXTROSE 2-4 GM/100ML-% IV SOLN
2.0000 g | INTRAVENOUS | Status: AC
  Administered 2017-02-14: 2 g via INTRAVENOUS

## 2017-02-14 MED ORDER — CLOPIDOGREL BISULFATE 75 MG PO TABS
75.0000 mg | ORAL_TABLET | Freq: Every day | ORAL | Status: DC
  Administered 2017-02-15: 75 mg via ORAL
  Filled 2017-02-14: qty 1

## 2017-02-14 MED ORDER — POLYETHYLENE GLYCOL 3350 17 G PO PACK: 17.0000 g | PACK | Freq: Every day | ORAL | Status: DC | PRN

## 2017-02-14 MED ORDER — OXYCODONE HCL 5 MG PO TABS: 5.0000 mg | ORAL_TABLET | ORAL | Status: DC | PRN

## 2017-02-14 MED ORDER — LIDOCAINE 2% (20 MG/ML) 5 ML SYRINGE
INTRAMUSCULAR | Status: AC
  Filled 2017-02-14: qty 5

## 2017-02-14 MED ORDER — METOCLOPRAMIDE HCL 5 MG PO TABS: 5.0000 mg | ORAL_TABLET | Freq: Three times a day (TID) | ORAL | Status: DC | PRN

## 2017-02-14 MED ORDER — PROPOFOL 10 MG/ML IV BOLUS
INTRAVENOUS | Status: DC | PRN
  Administered 2017-02-14: 140 mg via INTRAVENOUS

## 2017-02-14 MED ORDER — DEXAMETHASONE SODIUM PHOSPHATE 10 MG/ML IJ SOLN
10.0000 mg | Freq: Once | INTRAMUSCULAR | Status: AC
  Administered 2017-02-14: 10 mg via INTRAVENOUS

## 2017-02-14 MED ORDER — METHOCARBAMOL 500 MG PO TABS: 500.0000 mg | ORAL_TABLET | Freq: Four times a day (QID) | ORAL | Status: DC | PRN

## 2017-02-14 MED ORDER — DEXAMETHASONE SODIUM PHOSPHATE 10 MG/ML IJ SOLN
10.0000 mg | Freq: Once | INTRAMUSCULAR | Status: AC
  Administered 2017-02-15: 10 mg via INTRAVENOUS
  Filled 2017-02-14: qty 1

## 2017-02-14 MED ORDER — ONDANSETRON HCL 4 MG/2ML IJ SOLN
INTRAMUSCULAR | Status: AC
  Filled 2017-02-14: qty 2

## 2017-02-14 MED ORDER — GABAPENTIN 100 MG PO CAPS
100.0000 mg | ORAL_CAPSULE | Freq: Every day | ORAL | Status: DC
  Administered 2017-02-15: 100 mg via ORAL
  Filled 2017-02-14: qty 1

## 2017-02-14 MED ORDER — FENTANYL CITRATE (PF) 100 MCG/2ML IJ SOLN
INTRAMUSCULAR | Status: AC
  Filled 2017-02-14: qty 2

## 2017-02-14 MED ORDER — AMLODIPINE BESYLATE 5 MG PO TABS
5.0000 mg | ORAL_TABLET | Freq: Every day | ORAL | Status: DC
  Administered 2017-02-15: 5 mg via ORAL
  Filled 2017-02-14: qty 1

## 2017-02-14 MED ORDER — OXYCODONE HCL 5 MG PO TABS
10.0000 mg | ORAL_TABLET | ORAL | Status: DC | PRN
  Administered 2017-02-14 – 2017-02-15 (×4): 10 mg via ORAL
  Filled 2017-02-14 (×4): qty 2

## 2017-02-14 MED ORDER — SUGAMMADEX SODIUM 200 MG/2ML IV SOLN
INTRAVENOUS | Status: DC | PRN
Start: 2017-02-14 — End: 2017-02-14
  Administered 2017-02-14: 180 mg via INTRAVENOUS

## 2017-02-14 MED ORDER — PHENYLEPHRINE HCL 10 MG/ML IJ SOLN
INTRAMUSCULAR | Status: AC
  Filled 2017-02-14: qty 1

## 2017-02-14 MED ORDER — LACTATED RINGERS IV SOLN
INTRAVENOUS | Status: DC
  Administered 2017-02-14 (×2): via INTRAVENOUS

## 2017-02-14 MED ORDER — FENTANYL CITRATE (PF) 100 MCG/2ML IJ SOLN
INTRAMUSCULAR | Status: AC
  Filled 2017-02-14: qty 4

## 2017-02-14 MED ORDER — CEFAZOLIN SODIUM-DEXTROSE 2-4 GM/100ML-% IV SOLN
INTRAVENOUS | Status: AC
  Filled 2017-02-14: qty 100

## 2017-02-14 MED ORDER — BUPIVACAINE HCL (PF) 0.25 % IJ SOLN
INTRAMUSCULAR | Status: AC
  Filled 2017-02-14: qty 30

## 2017-02-14 MED ORDER — FENTANYL CITRATE (PF) 250 MCG/5ML IJ SOLN
INTRAMUSCULAR | Status: DC | PRN
  Administered 2017-02-14 (×2): 50 ug via INTRAVENOUS
  Administered 2017-02-14: 100 ug via INTRAVENOUS

## 2017-02-14 MED ORDER — ACETAMINOPHEN 325 MG PO TABS: 650.0000 mg | ORAL_TABLET | ORAL | Status: DC | PRN

## 2017-02-14 MED ORDER — FLEET ENEMA 7-19 GM/118ML RE ENEM: 1.0000 | ENEMA | Freq: Once | RECTAL | Status: DC | PRN

## 2017-02-14 MED ORDER — CEFAZOLIN SODIUM-DEXTROSE 2-4 GM/100ML-% IV SOLN
2.0000 g | Freq: Four times a day (QID) | INTRAVENOUS | Status: AC
  Administered 2017-02-14 (×2): 2 g via INTRAVENOUS
  Filled 2017-02-14 (×2): qty 100

## 2017-02-14 MED ORDER — BISACODYL 10 MG RE SUPP: 10.0000 mg | Freq: Every day | RECTAL | Status: DC | PRN

## 2017-02-14 MED ORDER — FENTANYL CITRATE (PF) 100 MCG/2ML IJ SOLN
25.0000 ug | INTRAMUSCULAR | Status: DC | PRN
  Administered 2017-02-14 (×3): 50 ug via INTRAVENOUS

## 2017-02-14 MED ORDER — SODIUM CHLORIDE 0.9 % IR SOLN
Status: DC | PRN
  Administered 2017-02-14: 1000 mL

## 2017-02-14 MED ORDER — ACETAMINOPHEN 10 MG/ML IV SOLN
1000.0000 mg | Freq: Once | INTRAVENOUS | Status: AC
  Administered 2017-02-14: 1000 mg via INTRAVENOUS

## 2017-02-14 MED ORDER — ACETAMINOPHEN 650 MG RE SUPP: 650.0000 mg | RECTAL | Status: DC | PRN

## 2017-02-14 MED ORDER — TRAMADOL HCL 50 MG PO TABS: 50.0000 mg | ORAL_TABLET | Freq: Four times a day (QID) | ORAL | Status: DC | PRN

## 2017-02-14 MED ORDER — MENTHOL 3 MG MT LOZG: 1.0000 | LOZENGE | OROMUCOSAL | Status: DC | PRN

## 2017-02-14 MED ORDER — MIDAZOLAM HCL 2 MG/2ML IJ SOLN
INTRAMUSCULAR | Status: DC | PRN
  Administered 2017-02-14: 1 mg via INTRAVENOUS

## 2017-02-14 MED ORDER — ATORVASTATIN CALCIUM 40 MG PO TABS
40.0000 mg | ORAL_TABLET | Freq: Every day | ORAL | Status: DC
  Administered 2017-02-15: 40 mg via ORAL
  Filled 2017-02-14: qty 1

## 2017-02-14 MED ORDER — SODIUM CHLORIDE 0.9 % IV SOLN
INTRAVENOUS | Status: DC
  Administered 2017-02-14: 17:00:00 via INTRAVENOUS

## 2017-02-14 MED ORDER — DOCUSATE SODIUM 100 MG PO CAPS
100.0000 mg | ORAL_CAPSULE | Freq: Two times a day (BID) | ORAL | Status: DC
  Administered 2017-02-14 – 2017-02-15 (×2): 100 mg via ORAL
  Filled 2017-02-14 (×2): qty 1

## 2017-02-14 MED ORDER — ASPIRIN EC 325 MG PO TBEC
325.0000 mg | DELAYED_RELEASE_TABLET | Freq: Every day | ORAL | Status: DC
  Administered 2017-02-15: 325 mg via ORAL
  Filled 2017-02-14: qty 1

## 2017-02-14 MED ORDER — DIPHENHYDRAMINE HCL 12.5 MG/5ML PO ELIX: 12.5000 mg | ORAL_SOLUTION | ORAL | Status: DC | PRN

## 2017-02-14 MED ORDER — ONDANSETRON HCL 4 MG PO TABS: 4.0000 mg | ORAL_TABLET | Freq: Four times a day (QID) | ORAL | Status: DC | PRN

## 2017-02-14 MED ORDER — SUGAMMADEX SODIUM 200 MG/2ML IV SOLN
INTRAVENOUS | Status: AC
  Filled 2017-02-14: qty 2

## 2017-02-14 MED ORDER — TRANEXAMIC ACID 1000 MG/10ML IV SOLN
2000.0000 mg | Freq: Once | INTRAVENOUS | Status: DC
  Filled 2017-02-14: qty 20

## 2017-02-14 MED ORDER — BUPIVACAINE HCL 0.25 % IJ SOLN
INTRAMUSCULAR | Status: DC | PRN
  Administered 2017-02-14: 30 mL

## 2017-02-14 SURGICAL SUPPLY — 64 items
BAG DECANTER FOR FLEXI CONT (MISCELLANEOUS) ×2 IMPLANT
BAG SPEC THK2 15X12 ZIP CLS (MISCELLANEOUS) ×1
BAG ZIPLOCK 12X15 (MISCELLANEOUS) ×4 IMPLANT
BIT DRILL 2.8X128 (BIT) ×2 IMPLANT
BLADE EXTENDED COATED 6.5IN (ELECTRODE) ×2 IMPLANT
BLADE SAW SGTL 73X25 THK (BLADE) ×1 IMPLANT
BRUSH FEMORAL CANAL (MISCELLANEOUS) IMPLANT
COVER SURGICAL LIGHT HANDLE (MISCELLANEOUS) ×2 IMPLANT
DRAPE INCISE IOBAN 66X45 STRL (DRAPES) ×2 IMPLANT
DRAPE ORTHO SPLIT 77X108 STRL (DRAPES) ×4
DRAPE POUCH INSTRU U-SHP 10X18 (DRAPES) ×2 IMPLANT
DRAPE SURG ORHT 6 SPLT 77X108 (DRAPES) ×2 IMPLANT
DRAPE U-SHAPE 47X51 STRL (DRAPES) ×2 IMPLANT
DRSG ADAPTIC 3X8 NADH LF (GAUZE/BANDAGES/DRESSINGS) ×1 IMPLANT
DRSG MEPILEX BORDER 4X4 (GAUZE/BANDAGES/DRESSINGS) ×3 IMPLANT
DRSG MEPILEX BORDER 4X8 (GAUZE/BANDAGES/DRESSINGS) ×2 IMPLANT
DURAPREP 26ML APPLICATOR (WOUND CARE) ×2 IMPLANT
ELECT REM PT RETURN 15FT ADLT (MISCELLANEOUS) ×2 IMPLANT
EVACUATOR 1/8 PVC DRAIN (DRAIN) ×2 IMPLANT
FACESHIELD WRAPAROUND (MASK) ×8 IMPLANT
FACESHIELD WRAPAROUND OR TEAM (MASK) ×4 IMPLANT
GLOVE BIO SURGEON STRL SZ7.5 (GLOVE) ×2 IMPLANT
GLOVE BIO SURGEON STRL SZ8 (GLOVE) ×2 IMPLANT
GLOVE BIOGEL PI IND STRL 8 (GLOVE) ×3 IMPLANT
GLOVE BIOGEL PI INDICATOR 8 (GLOVE) ×3
GLOVE SURG SS PI 6.5 STRL IVOR (GLOVE) ×4 IMPLANT
GOWN STRL REUS W/TWL LRG LVL3 (GOWN DISPOSABLE) ×4 IMPLANT
GOWN STRL REUS W/TWL XL LVL3 (GOWN DISPOSABLE) ×3 IMPLANT
HANDPIECE INTERPULSE COAX TIP (DISPOSABLE)
HEAD TAPER DEPUY LRG 36PLUS11 (Hips) ×1 IMPLANT
IMMOBILIZER KNEE 20 (SOFTGOODS) ×2
IMMOBILIZER KNEE 20 THIGH 36 (SOFTGOODS) IMPLANT
LINER ACETABULAR 36MM 56MM HIP (Liner) ×1 IMPLANT
MANIFOLD NEPTUNE II (INSTRUMENTS) ×2 IMPLANT
MARKER SKIN DUAL TIP RULER LAB (MISCELLANEOUS) ×2 IMPLANT
NDL SAFETY ECLIPSE 18X1.5 (NEEDLE) ×1 IMPLANT
NEEDLE HYPO 18GX1.5 SHARP (NEEDLE) ×2
NS IRRIG 1000ML POUR BTL (IV SOLUTION) ×2 IMPLANT
PADDING CAST COTTON 6X4 STRL (CAST SUPPLIES) ×2 IMPLANT
PASSER SUT SWANSON 36MM LOOP (INSTRUMENTS) ×2 IMPLANT
POSITIONER SURGICAL ARM (MISCELLANEOUS) ×2 IMPLANT
PRESSURIZER FEMORAL UNIV (MISCELLANEOUS) IMPLANT
RING LOCK ACET OD 56/68 (Hips) ×1 IMPLANT
SET HNDPC FAN SPRY TIP SCT (DISPOSABLE) IMPLANT
SPONGE LAP 18X18 X RAY DECT (DISPOSABLE) ×2 IMPLANT
STAPLER VISISTAT 35W (STAPLE) ×2 IMPLANT
STRIP CLOSURE SKIN 1/2X4 (GAUZE/BANDAGES/DRESSINGS) ×1 IMPLANT
SUCTION FRAZIER HANDLE 10FR (MISCELLANEOUS) ×1
SUCTION TUBE FRAZIER 10FR DISP (MISCELLANEOUS) ×1 IMPLANT
SUT ETHIBOND NAB CT1 #1 30IN (SUTURE) ×4 IMPLANT
SUT VIC AB 1 CT1 27 (SUTURE) ×6
SUT VIC AB 1 CT1 27XBRD ANTBC (SUTURE) ×3 IMPLANT
SUT VIC AB 2-0 CT1 27 (SUTURE) ×6
SUT VIC AB 2-0 CT1 TAPERPNT 27 (SUTURE) ×3 IMPLANT
SUT VLOC 180 0 24IN GS25 (SUTURE) ×4 IMPLANT
SWAB COLLECTION DEVICE MRSA (MISCELLANEOUS) ×2 IMPLANT
SWAB CULTURE ESWAB REG 1ML (MISCELLANEOUS) IMPLANT
SYR 50ML LL SCALE MARK (SYRINGE) ×2 IMPLANT
TOWEL OR 17X26 10 PK STRL BLUE (TOWEL DISPOSABLE) ×4 IMPLANT
TOWER CARTRIDGE SMART MIX (DISPOSABLE) IMPLANT
TRAY FOLEY W/METER SILVER 16FR (SET/KITS/TRAYS/PACK) ×2 IMPLANT
TUBE KAMVAC SUCTION (TUBING) IMPLANT
WATER STERILE IRR 1000ML POUR (IV SOLUTION) ×2 IMPLANT
YANKAUER SUCT BULB TIP 10FT TU (MISCELLANEOUS) ×2 IMPLANT

## 2017-02-14 NOTE — Interval H&P Note (Signed)
History and Physical Interval Note:  02/14/2017 9:37 AM  Ronald Stafford  has presented today for surgery, with the diagnosis of Polyethylene wear left hip   The various methods of treatment have been discussed with the patient and family. After consideration of risks, benefits and other options for treatment, the patient has consented to  Procedure(s): Left hip polyethylene versus total hip arthroplasty revision (Left) as a surgical intervention .  The patient's history has been reviewed, patient examined, no change in status, stable for surgery.  I have reviewed the patient's chart and labs.  Questions were answered to the patient's satisfaction.     Pilar Plate [name]

## 2017-02-14 NOTE — Transfer of Care (Signed)
Immediate Anesthesia Transfer of Care Note  Patient: Raymon Schlarb Mazurek  Procedure(s) Performed: Left hip polyethylene EXCHANGE (Left Hip)  Patient Location: PACU  Anesthesia Type:General  Level of Consciousness: awake  Airway & Oxygen Therapy: Patient Spontanous Breathing and Patient connected to face mask oxygen  Post-op Assessment: Report given to RN and Post -op Vital signs reviewed and stable  Post vital signs: Reviewed and stable  Last Vitals:  Vitals:   02/14/17 0955  BP: (!) 158/81  Pulse: 88  Resp: 18  Temp: 36.4 C  SpO2: 98%    Last Pain:  Vitals:   02/14/17 0955  PainSc: 0-No pain         Complications: No apparent anesthesia complications

## 2017-02-14 NOTE — Anesthesia Postprocedure Evaluation (Signed)
Anesthesia Post Note  Patient: Ronald Stafford  Procedure(s) Performed: Left hip polyethylene EXCHANGE (Left Hip)     Patient location during evaluation: PACU Anesthesia Type: General Level of consciousness: awake and alert Pain management: pain level controlled Vital Signs Assessment: post-procedure vital signs reviewed and stable Respiratory status: spontaneous breathing, nonlabored ventilation, respiratory function stable and patient connected to nasal cannula oxygen Cardiovascular status: blood pressure returned to baseline and stable Postop Assessment: no apparent nausea or vomiting Anesthetic complications: no    Last Vitals:  Vitals:   02/14/17 1515 02/14/17 1616  BP: (!) 138/58 134/66  Pulse: 81 77  Resp: 14 18  Temp: 36.4 C (!) 36.4 C  SpO2: 98% 100%                Effie Berkshire

## 2017-02-14 NOTE — Anesthesia Procedure Notes (Signed)
Procedure Name: Intubation Date/Time: 02/14/2017 11:08 AM Performed by: Sharlette Dense, CRNA Patient Re-evaluated:Patient Re-evaluated prior to induction Oxygen Delivery Method: Circle system utilized Preoxygenation: Pre-oxygenation with 100% oxygen Induction Type: IV induction Ventilation: Mask ventilation without difficulty Laryngoscope Size: Mac and 3 Grade View: Grade I Tube type: Oral Tube size: 7.5 mm Number of attempts: 1 Airway Equipment and Method: Stylet Placement Confirmation: ETT inserted through vocal cords under direct vision,  positive ETCO2 and breath sounds checked- equal and bilateral Secured at: 22 cm Tube secured with: Tape Dental Injury: Teeth and Oropharynx as per pre-operative assessment

## 2017-02-14 NOTE — Anesthesia Preprocedure Evaluation (Addendum)
Anesthesia Evaluation  Patient identified by MRN, date of birth, ID band Patient awake    Reviewed: Allergy & Precautions, NPO status , Patient's Chart, lab work & pertinent test results  Airway Mallampati: II  TM Distance: >3 FB Neck ROM: Full    Dental  (+) Upper Dentures, Partial Lower   Pulmonary former smoker,    breath sounds clear to auscultation       Cardiovascular hypertension, Pt. on medications + Peripheral Vascular Disease   Rhythm:Regular Rate:Normal     Neuro/Psych  Headaches, PSYCHIATRIC DISORDERS Depression TIA   GI/Hepatic Neg liver ROS, GERD  ,  Endo/Other  negative endocrine ROS  Renal/GU negative Renal ROS  negative genitourinary   Musculoskeletal  (+) Arthritis ,   Abdominal   Peds  Hematology negative hematology ROS (+)   Anesthesia Other Findings   Reproductive/Obstetrics negative OB ROS                            Lab Results  Component Value Date   WBC 4.9 02/09/2017   HGB 14.6 02/09/2017   HCT 43.8 02/09/2017   MCV 94.8 02/09/2017   PLT 127 (L) 02/09/2017   Stress Test: - Stress ECG conclusions: There were no stress arrhythmias or   conduction abnormalities. The stress ECG was normal. - Staged echo: Normal echo stress  Impressions: - Normal study after pharmacologic stress.  EKG: normal sinus rhythm.  Anesthesia Physical Anesthesia Plan  ASA: II  Anesthesia Plan: General   Post-op Pain Management:    Induction: Intravenous  PONV Risk Score and Plan: 3 and Ondansetron and Dexamethasone  Airway Management Planned: Oral ETT  Additional Equipment:   Intra-op Plan:   Post-operative Plan: Extubation in OR  Informed Consent: I have reviewed the patients History and Physical, chart, labs and discussed the procedure including the risks, benefits and alternatives for the proposed anesthesia with the patient or authorized representative who has  indicated his/her understanding and acceptance.   Dental advisory given  Plan Discussed with: CRNA  Anesthesia Plan Comments:        Anesthesia Quick Evaluation

## 2017-02-14 NOTE — Brief Op Note (Signed)
02/14/2017  12:45 PM  PATIENT:  Olene Craven Macleod  83 y.o. male  PRE-OPERATIVE DIAGNOSIS: FAILED LEFT HIP SECONDARY TO POLYETHYLENE WEAR  POST-OPERATIVE DIAGNOSIS: FAILED LEFT HIP SECONDARY TO POLYETHYLENE WEAR  PROCEDURE:  LEFT HIP ACETABULAR LINER REVISION  SURGEON:  Surgeon(s) and Role:    Gaynelle Arabian, MD - Primary  PHYSICIAN ASSISTANT:   ASSISTANTS: Arlee Muslim, PA-C   ANESTHESIA:   general  EBL:  100 mL   BLOOD ADMINISTERED:none  DRAINS: (Medium) Hemovact drain(s) in the left hip with  Suction Open   LOCAL MEDICATIONS USED:  MARCAINE     COUNTS:  YES  TOURNIQUET:  * No tourniquets in log *  DICTATION: .Other Dictation: Dictation Number [id number]  PLAN OF CARE: Admit to inpatient   PATIENT DISPOSITION:  PACU - hemodynamically stable.

## 2017-02-15 LAB — BASIC METABOLIC PANEL
Anion gap: 6 (ref 5–15)
BUN: 16 mg/dL (ref 6–20)
CALCIUM: 8.6 mg/dL — AB (ref 8.9–10.3)
CO2: 27 mmol/L (ref 22–32)
CREATININE: 0.87 mg/dL (ref 0.61–1.24)
Chloride: 105 mmol/L (ref 101–111)
Glucose, Bld: 174 mg/dL — ABNORMAL HIGH (ref 65–99)
Potassium: 4.1 mmol/L (ref 3.5–5.1)
SODIUM: 138 mmol/L (ref 135–145)

## 2017-02-15 LAB — CBC
HCT: 38.9 % — ABNORMAL LOW (ref 39.0–52.0)
Hemoglobin: 13.2 g/dL (ref 13.0–17.0)
MCH: 31.9 pg (ref 26.0–34.0)
MCHC: 33.9 g/dL (ref 30.0–36.0)
MCV: 94 fL (ref 78.0–100.0)
PLATELETS: 123 10*3/uL — AB (ref 150–400)
RBC: 4.14 MIL/uL — ABNORMAL LOW (ref 4.22–5.81)
RDW: 13.5 % (ref 11.5–15.5)
WBC: 9.6 10*3/uL (ref 4.0–10.5)

## 2017-02-15 LAB — GLUCOSE, CAPILLARY: GLUCOSE-CAPILLARY: 128 mg/dL — AB (ref 65–99)

## 2017-02-15 MED ORDER — OXYCODONE HCL 5 MG PO TABS: 5.0000 mg | ORAL_TABLET | ORAL | 0 refills | Status: DC | PRN

## 2017-02-15 MED ORDER — ASPIRIN 325 MG PO TBEC: 325.0000 mg | DELAYED_RELEASE_TABLET | Freq: Every day | ORAL | 0 refills | Status: DC

## 2017-02-15 MED ORDER — TRAMADOL HCL 50 MG PO TABS: 50.0000 mg | ORAL_TABLET | Freq: Four times a day (QID) | ORAL | 0 refills | Status: DC | PRN

## 2017-02-15 MED ORDER — TRAMADOL HCL 50 MG PO TABS: 50.0000 mg | ORAL_TABLET | Freq: Four times a day (QID) | ORAL | Status: DC | PRN

## 2017-02-15 MED ORDER — METHOCARBAMOL 500 MG PO TABS: 500.0000 mg | ORAL_TABLET | Freq: Four times a day (QID) | ORAL | 0 refills | Status: DC | PRN

## 2017-02-15 NOTE — Progress Notes (Signed)
Patient was told many times about precautions as He stated " I know, I know" and yet still many times did break these precautions. Family is aware of precautions as to try to prevent future dislocations.

## 2017-02-15 NOTE — Discharge Summary (Signed)
Physician Discharge Summary   Patient ID: Ronald Stafford MRN: [id number] DOB/AGE: 1930/07/13 83 y.o.  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   Admission Diagnoses:  Past Medical History:  Diagnosis Date  . Cancer [facility]) 2004   prostate cancer, Dr. Risa Grill  . Colon polyps   . Deep venous thrombosis ([place]) 1997   Postop  . Depression   . GERD (gastroesophageal reflux disease)   . Hyperlipidemia   . Hypertension   . Shoulder fracture, left 02/23/2011   immobilized in sling , Dr Norris(no surgery)   Discharge Diagnoses:   Principal Problem:   Failed total hip arthroplasty ([place])  Estimated body mass index is 24.01 kg/m as calculated from the following:   Height as of this encounter: 6' 2" (1.88 m).   Weight as of this encounter: 84.8 kg (187 lb).  Procedure(s) (LRB): Left hip polyethylene EXCHANGE (Left)   Consults: None  HPI: [name] is an 83 year old male with significant pain, left hip.  He has eccentric polyethylene wear in his acetabular liner.  He had a normal bone scan, which showed no evidence of loosening.  He has had progressively worsening pain and presents now for polyethylene liner versus total hip arthroplasty revision.   Laboratory Data: Admission on 02/14/2017  Component Date Value Ref Range Status  . WBC 02/15/2017 9.6  4.0 - 10.5 K/uL Final  . RBC 02/15/2017 4.14* 4.22 - 5.81 MIL/uL Final  . Hemoglobin 02/15/2017 13.2  13.0 - 17.0 g/dL Final  . HCT 02/15/2017 38.9* 39.0 - 52.0 % Final  . MCV 02/15/2017 94.0  78.0 - 100.0 fL Final  . MCH 02/15/2017 31.9  26.0 - 34.0 pg Final  . MCHC 02/15/2017 33.9  30.0 - 36.0 g/dL Final  . RDW 02/15/2017 13.5  11.5 - 15.5 % Final  . Platelets 02/15/2017 123* 150 - 400 K/uL Final  . Sodium 02/15/2017 138  135 - 145 mmol/L Final  . Potassium 02/15/2017 4.1  3.5 - 5.1 mmol/L Final  . Chloride 02/15/2017 105  101 - 111 mmol/L Final  . CO2  02/15/2017 27  22 - 32 mmol/L Final  . Glucose, Bld 02/15/2017 174* 65 - 99 mg/dL Final  . BUN 02/15/2017 16  6 - 20 mg/dL Final  . Creatinine, Ser 02/15/2017 0.87  0.61 - 1.24 mg/dL Final  . Calcium 02/15/2017 8.6* 8.9 - 10.3 mg/dL Final  . GFR calc non Af Amer 02/15/2017 >60  >60 mL/min Final  . GFR calc Af Amer 02/15/2017 >60  >60 mL/min Final   Comment: (NOTE) The eGFR has been calculated using the CKD EPI equation. This calculation has not been validated in all clinical situations. eGFR's persistently <60 mL/min signify possible Chronic Kidney Disease.   . Anion gap 02/15/2017 6  5 - 15 Final  . Glucose-Capillary 02/15/2017 128* 65 - 99 mg/dL Final  Hospital Outpatient Visit on 02/09/2017  Component Date Value Ref Range Status  . aPTT 02/09/2017 34  24 - 36 seconds Final  . WBC 02/09/2017 4.9  4.0 - 10.5 K/uL Final  . RBC 02/09/2017 4.62  4.22 - 5.81 MIL/uL Final  . Hemoglobin 02/09/2017 14.6  13.0 - 17.0 g/dL Final  . HCT 02/09/2017 43.8  39.0 - 52.0 % Final  . MCV 02/09/2017 94.8  78.0 - 100.0 fL Final  . MCH 02/09/2017 31.6  26.0 - 34.0 pg Final  . MCHC 02/09/2017 33.3  30.0 - 36.0 g/dL  Final  . RDW 02/09/2017 13.9  11.5 - 15.5 % Final  . Platelets 02/09/2017 127* 150 - 400 K/uL Final  . Sodium 02/09/2017 141  135 - 145 mmol/L Final  . Potassium 02/09/2017 4.5  3.5 - 5.1 mmol/L Final  . Chloride 02/09/2017 106  101 - 111 mmol/L Final  . CO2 02/09/2017 29  22 - 32 mmol/L Final  . Glucose, Bld 02/09/2017 100* 65 - 99 mg/dL Final  . BUN 02/09/2017 15  6 - 20 mg/dL Final  . Creatinine, Ser 02/09/2017 0.86  0.61 - 1.24 mg/dL Final  . Calcium 02/09/2017 9.7  8.9 - 10.3 mg/dL Final  . Total Protein 02/09/2017 7.0  6.5 - 8.1 g/dL Final  . Albumin 02/09/2017 4.4  3.5 - 5.0 g/dL Final  . AST 02/09/2017 33  15 - 41 U/L Final  . ALT 02/09/2017 27  17 - 63 U/L Final  . Alkaline Phosphatase 02/09/2017 60  38 - 126 U/L Final  . Total Bilirubin 02/09/2017 0.8  0.3 - 1.2 mg/dL Final    . GFR calc non Af Amer 02/09/2017 >60  >60 mL/min Final  . GFR calc Af Amer 02/09/2017 >60  >60 mL/min Final   Comment: (NOTE) The eGFR has been calculated using the CKD EPI equation. This calculation has not been validated in all clinical situations. eGFR's persistently <60 mL/min signify possible Chronic Kidney Disease.   . Anion gap 02/09/2017 6  5 - 15 Final  . Prothrombin Time 02/09/2017 12.8  11.4 - 15.2 seconds Final  . INR 02/09/2017 0.98   Final  . ABO/RH(D) 02/09/2017 A POS   Final  . Antibody Screen 02/09/2017 NEG   Final  . Sample Expiration 02/09/2017 02/17/2017   Final  . Extend sample reason 02/09/2017 NO TRANSFUSIONS OR PREGNANCY IN THE PAST 3 MONTHS   Final  . MRSA, PCR 02/09/2017 NEGATIVE  NEGATIVE Final  . Staphylococcus aureus 02/09/2017 NEGATIVE  NEGATIVE Final   Comment: (NOTE) The Xpert SA Assay (FDA approved for NASAL specimens in patients 41 years of age and older), is one component of a comprehensive surveillance program. It is not intended to diagnose infection nor to guide or monitor treatment.      X-Rays:Dg Pelvis Portable  Result Date: 02/14/2017 CLINICAL DATA:  Anterior approach left hip arthroplasty. EXAM: PORTABLE PELVIS 1-2 VIEWS COMPARISON:  10/01/2012 lumbar radiographs which portions of the left hip. FINDINGS: There is an uncemented left hip arthroplasty without immediate postoperative complications. A single surgical drain projects over the left hip. Postop soft tissue emphysema is seen adjacent to the left femoral neck component. IMPRESSION: Left uncemented hip arthroplasty without immediate postop complications. Electronically Signed   By: Ashley Royalty M.D.   On: 02/14/2017 13:57    EKG: Orders placed or performed during the hospital encounter of 07/30/16  . EKG 12-Lead (Repeat cardiac markers, recurrent chest pain)  . EKG 12-Lead (Repeat cardiac markers, recurrent chest pain)  . EKG     Hospital Course: Patient was admitted to [facility] and taken to the OR and underwent the above state procedure without complications.  Patient tolerated the procedure well and was later transferred to the recovery room and then to the orthopaedic floor for postoperative care.  They were given PO and IV analgesics for pain control following their surgery.  They were given 24 hours of postoperative antibiotics of  Anti-infectives (From admission, onward)   Start     Dose/Rate Route Frequency Ordered Stop  02/14/17 1700  ceFAZolin (ANCEF) IVPB 2g/100 mL premix     2 g 200 mL/hr over 30 Minutes Intravenous Every 6 hours 02/14/17 1537 02/14/17 2315   02/14/17 0940  ceFAZolin (ANCEF) 2-4 GM/100ML-% IVPB    Comments:  Algis Liming   : cabinet override      02/14/17 0940 02/14/17 1114   02/14/17 0934  ceFAZolin (ANCEF) IVPB 2g/100 mL premix     2 g 200 mL/hr over 30 Minutes Intravenous On call to O.R. 02/14/17 0321 02/14/17 1129     and started on DVT prophylaxis in the form of Plavix and Aspirin 325 mg.   PT and OT were ordered for total hip protocol.  The patient was allowed to be WBAT with therapy. Discharge planning was consulted to help with postop disposition and equipment needs.  Patient had a good night on the evening of surgery.  They started to get up OOB with therapy on day one.  Hemovac drain was pulled without difficulty.  The knee immobilizer was removed and discontinued. Dressing was checked was okay.   Patient was seen in rounds on POD 1 by Dr. Wynelle Link and it was felt that the patient would be ready to go home later that same day.   Diet: Cardiac diet Activity:WBAT No bending hip over 90 degrees- A "L" Angle Do not cross legs Do not let foot roll inward When turning these patients a pillow should be placed between the patient's legs to prevent crossing. Patients should have the affected knee fully extended when trying to sit or stand from all surfaces to prevent excessive hip flexion. When ambulating and turning toward  the affected side the affected leg should have the toes turned out prior to moving the walker and the rest of patient's body as to prevent internal rotation/ turning in of the leg. Abduction pillows are the most effective way to prevent a patient from not crossing legs or turning toes in at rest. If an abduction pillow is not ordered placing a regular pillow length wise between the patient's legs is also an effective reminder. It is imperative that these precautions be maintained so that the surgical hip does not dislocate. Follow-up:in 2 weeks Disposition - Home Discharged Condition: stable   Discharge Instructions    Call MD / Call 911   Complete by:  As directed    If you experience chest pain or shortness of breath, CALL 911 and be transported to the hospital emergency room.  If you develope a fever above 101 F, pus (white drainage) or increased drainage or redness at the wound, or calf pain, call your surgeon's office.   Change dressing   Complete by:  As directed    You may change your dressing dressing daily with sterile 4 x 4 inch gauze dressing and paper tape.  Do not submerge the incision under water.   Constipation Prevention   Complete by:  As directed    Drink plenty of fluids.  Prune juice may be helpful.  You may use a stool softener, such as Colace (over the counter) 100 mg twice a day.  Use MiraLax (over the counter) for constipation as needed.   Diet - low sodium heart healthy   Complete by:  As directed    Discharge instructions   Complete by:  As directed    Take a full dose 325 mg enteric coated aspirin daily for three weeks, then reduce to a baby 81 mg Aspirin daily for three additional weeks,  then discontinue the aspirin.  Pick up stool softner and laxative for home use following surgery while on pain medications. Do not submerge incision under water. Please use good hand washing techniques while changing dressing each day. May shower starting three days after  surgery. Please use a clean towel to pat the incision dry following showers. Continue to use ice for pain and swelling after surgery. Do not use any lotions or creams on the incision until instructed by your surgeon.  Wear both TED hose on both legs during the day every day for three weeks, but may remove the TED hose at night at home.  Postoperative Constipation Protocol  Constipation - defined medically as fewer than three stools per week and severe constipation as less than one stool per week.  One of the most common issues patients have following surgery is constipation.  Even if you have a regular bowel pattern at home, your normal regimen is likely to be disrupted due to multiple reasons following surgery.  Combination of anesthesia, postoperative narcotics, change in appetite and fluid intake all can affect your bowels.  In order to avoid complications following surgery, here are some recommendations in order to help you during your recovery period.  Colace (docusate) - Pick up an over-the-counter form of Colace or another stool softener and take twice a day as long as you are requiring postoperative pain medications.  Take with a full glass of water daily.  If you experience loose stools or diarrhea, hold the colace until you stool forms back up.  If your symptoms do not get better within 1 week or if they get worse, check with your doctor.  Dulcolax (bisacodyl) - Pick up over-the-counter and take as directed by the product packaging as needed to assist with the movement of your bowels.  Take with a full glass of water.  Use this product as needed if not relieved by Colace only.   MiraLax (polyethylene glycol) - Pick up over-the-counter to have on hand.  MiraLax is a solution that will increase the amount of water in your bowels to assist with bowel movements.  Take as directed and can mix with a glass of water, juice, soda, coffee, or tea.  Take if you go more than two days without a  movement. Do not use MiraLax more than once per day. Call your doctor if you are still constipated or irregular after using this medication for 7 days in a row.  If you continue to have problems with postoperative constipation, please contact the office for further assistance and recommendations.  If you experience "the worst abdominal pain ever" or develop nausea or vomiting, please contact the office immediatly for further recommendations for treatment.   Do not sit on low chairs, stoools or toilet seats, as it may be difficult to get up from low surfaces   Complete by:  As directed    Driving restrictions   Complete by:  As directed    No driving until released by the physician.   Follow the hip precautions as taught in Physical Therapy   Complete by:  As directed    Increase activity slowly as tolerated   Complete by:  As directed    Lifting restrictions   Complete by:  As directed    No lifting until released by the physician.   Patient may shower   Complete by:  As directed    You may shower without a dressing once there is no drainage.  Do  not wash over the wound.  If drainage remains, do not shower until drainage stops.   TED hose   Complete by:  As directed    Use stockings (TED hose) for 3 weeks on both leg(s).  You may remove them at night for sleeping.   Weight bearing as tolerated   Complete by:  As directed    Laterality:  left   Extremity:  Lower     Allergies as of 02/15/2017      Reactions   Chicken Allergy Nausea And Vomiting, Other (See Comments)   Digestive problems      Medication List    STOP taking these medications   Fish Oil 1000 MG Caps   multivitamin Tabs tablet   vitamin C 250 MG tablet Commonly known as:  ASCORBIC ACID     TAKE these medications   acetaminophen 325 MG tablet Commonly known as:  TYLENOL Take 650 mg by mouth every 6 (six) hours as needed for moderate pain or headache.   amLODipine 5 MG tablet Commonly known as:   NORVASC Take 1 tablet (5 mg total) by mouth daily.   aspirin 325 MG EC tablet Take 1 tablet (325 mg total) by mouth daily with breakfast. Take a full dose 325 mg enteric coated aspirin daily for three weeks, then reduce to a baby 81 mg Aspirin daily for three additional weeks, then discontinue the aspirin.   atorvastatin 40 MG tablet Commonly known as:  LIPITOR Take 1 tablet (40 mg total) by mouth daily.   clopidogrel 75 MG tablet Commonly known as:  PLAVIX Take 1 tablet (75 mg total) by mouth daily.   gabapentin 100 MG capsule Commonly known as:  NEURONTIN Take 1 capsule (100 mg total) by mouth at bedtime. -- Office visit needed for further refills What changed:    when to take this  additional instructions   methocarbamol 500 MG tablet Commonly known as:  ROBAXIN Take 1 tablet (500 mg total) by mouth every 6 (six) hours as needed for muscle spasms.   oxyCODONE 5 MG immediate release tablet Commonly known as:  Oxy IR/ROXICODONE Take 1-2 tablets (5-10 mg total) by mouth every 4 (four) hours as needed for moderate pain or severe pain.   traMADol 50 MG tablet Commonly known as:  ULTRAM Take 1-2 tablets (50-100 mg total) by mouth every 6 (six) hours as needed for moderate pain. What changed:    how much to take  when to take this  reasons to take this            Discharge Care Instructions  (From admission, onward)        Start     Ordered   02/15/17 0000  Weight bearing as tolerated    Question Answer Comment  Laterality left   Extremity Lower      02/15/17 0728   02/15/17 0000  Change dressing    Comments:  You may change your dressing dressing daily with sterile 4 x 4 inch gauze dressing and paper tape.  Do not submerge the incision under water.   02/15/17 8882     Follow-up Information    Gaynelle Arabian, MD. Schedule an appointment as soon as possible for a visit on 02/27/2017.   Specialty:  Orthopedic Surgery Contact information: [address] [place] [address] [phone]           Signed: Arlee Muslim, PA-C Orthopaedic Surgery 02/15/2017, 7:30 AM

## 2017-02-15 NOTE — Progress Notes (Signed)
   Subjective: 1 Day Post-Op Procedure(s) (LRB): Left hip polyethylene EXCHANGE (Left) Patient reports pain as mild.   Patient seen in rounds by Dr. Wynelle Link. Patient is well, but has had some minor complaints of pain in the hip, requiring pain medications We will start therapy today.  If they do well with therapy and meets all goals, then will allow home later this afternoon following therapy. Plan is to go Home after hospital stay.  Objective: Vital signs in last 24 hours: Temp:  [97.3 F (36.3 C)-98 F (36.7 C)] 98 F (36.7 C) (12/06 0621) Pulse Rate:  [65-89] 83 (12/06 0621) Resp:  [6-19] 18 (12/06 0621) BP: (112-165)/(52-81) 159/66 (12/06 0621) SpO2:  [96 %-100 %] 98 % (12/06 0621) Weight:  [84.8 kg (187 lb)] 84.8 kg (187 lb) (12/05 0958)  Intake/Output from previous day:  Intake/Output Summary (Last 24 hours) at 02/15/2017 0721 Last data filed at 02/15/2017 0622 Gross per 24 hour  Intake 4001.66 ml  Output 2050 ml  Net 1951.66 ml    Intake/Output this shift: No intake/output data recorded.  Labs: Recent Labs    02/15/17 0531  HGB 13.2   Recent Labs    02/15/17 0531  WBC 9.6  RBC 4.14*  HCT 38.9*  PLT 123*   Recent Labs    02/15/17 0531  NA 138  K 4.1  CL 105  CO2 27  BUN 16  CREATININE 0.87  GLUCOSE 174*  CALCIUM 8.6*   No results for input(s): LABPT, INR in the last 72 hours.  EXAM General - Patient is Alert, Appropriate and Oriented Extremity - Neurovascular intact Sensation intact distally Intact pulses distally Dorsiflexion/Plantar flexion intact Dressing - dressing C/D/I Motor Function - intact, moving foot and toes well on exam.  Hemovac pulled without difficulty.  Past Medical History:  Diagnosis Date  . Cancer [facility]) 2004   prostate cancer, Dr. Risa Grill  . Colon polyps   . Deep venous thrombosis ([place]) 1997   Postop  . Depression   . GERD (gastroesophageal reflux disease)   . Hyperlipidemia   . Hypertension   . Shoulder  fracture, left 02/23/2011   immobilized in sling , Dr Norris(no surgery)    Assessment/Plan: 1 Day Post-Op Procedure(s) (LRB): Left hip polyethylene EXCHANGE (Left) Principal Problem:   Failed total hip arthroplasty ([place])  Estimated body mass index is 24.01 kg/m as calculated from the following:   Height as of this encounter: 6\' 2"  (1.88 m).   Weight as of this encounter: 84.8 kg (187 lb). Advance diet Up with therapy Discharge home with home health  DVT Prophylaxis - Aspirin and Plavix Weight Bearing As Tolerated left Leg Hemovac Pulled Begin Therapy  If meets goals and able to go home: Discharge home with home health Diet - Cardiac diet Follow up - in 2 weeks Activity - WBAT Disposition - Home Condition Upon Discharge - Stable D/C Meds - See DC Summary DVT Prophylaxis - Aspirin and Plavix  Arlee Muslim, PA-C Orthopaedic Surgery 02/15/2017, 7:21 AM

## 2017-02-15 NOTE — Progress Notes (Signed)
Physical Therapy Treatment Patient Details Name: Ronald Stafford MRN: [id number] DOB: Sep 13, 1930 Today's Date: 02/15/2017    History of Present Illness Pt s/p liner revision of L THR    PT Comments    Pt progressing well with mobility and eager for return home.  Reviewed home therex, stairs and car transfers.   Follow Up Recommendations  Home health PT;DC plan and follow up therapy as arranged by surgeon     Equipment Recommendations  None recommended by PT    Recommendations for Other Services       Precautions / Restrictions Precautions Precautions: Fall;Posterior Hip Precaution Booklet Issued: Yes (comment) Precaution Comments: Pt recalls 2/3 THP sans cues.  All THP reviewed and provided in writing Restrictions Weight Bearing Restrictions: No Other Position/Activity Restrictions: WBAT    Mobility  Bed Mobility Overal bed mobility: Needs Assistance Bed Mobility: Supine to Sit;Sit to Supine     Supine to sit: Supervision Sit to supine: Supervision   General bed mobility comments: cues for sequence and adherence to THP  Transfers Overall transfer level: Needs assistance Equipment used: Rolling walker (2 wheeled) Transfers: Sit to/from Stand Sit to Stand: Supervision         General transfer comment: cues for LE management, use of UEs to self assist and adherence to THP  Ambulation/Gait Ambulation/Gait assistance: Min guard;Supervision Ambulation Distance (Feet): 150 Feet Assistive device: Rolling walker (2 wheeled) Gait Pattern/deviations: Step-to pattern;Step-through pattern;Decreased step length - right;Decreased step length - left;Shuffle;Trunk flexed     General Gait Details: cues for sequence, posture and position from RW   Stairs Stairs: Yes   Stair Management: No rails;Forwards;With walker;Step to pattern Number of Stairs: 3 General stair comments: single step 3x with cues for sequence and foot/RW placement  Wheelchair Mobility    Modified  Rankin (Stroke Patients Only)       Balance Overall balance assessment: No apparent balance deficits (not formally assessed)                                          Cognition Arousal/Alertness: Awake/alert Behavior During Therapy: WFL for tasks assessed/performed;Impulsive Overall Cognitive Status: Within Functional Limits for tasks assessed                                 General Comments: cues to slow pace to adhere to THP      Exercises Total Joint Exercises Ankle Circles/Pumps: AROM;Both;20 reps;Supine Quad Sets: AROM;Both;10 reps;Supine Heel Slides: AAROM;Left;Supine;15 reps Hip ABduction/ADduction: AAROM;Left;15 reps;Supine Long Arc Quad: AROM;Left;5 reps;Seated    General Comments        Pertinent Vitals/Pain Pain Assessment: 0-10 Pain Score: 3  Pain Location: L hip Pain Descriptors / Indicators: Aching;Sore Pain Intervention(s): Limited activity within patient's tolerance;Monitored during session;Premedicated before session;Ice applied    Home Living Family/patient expects to be discharged to:: Private residence Living Arrangements: Alone Available Help at Discharge: Family Type of Home: House Home Access: Stairs to enter   Home Layout: One level Home Equipment: Environmental consultant - 2 wheels;Walker - 4 wheels;Cane - single point;Wheelchair - manual      Prior Function Level of Independence: Independent          PT Goals (current goals can now be found in the care plan section) Acute Rehab PT Goals Patient Stated Goal: Regain IND  PT Goal  Formulation: With patient Time For Goal Achievement: 02/17/17 Potential to Achieve Goals: Good Progress towards PT goals: Progressing toward goals    Frequency    7X/week      PT Plan Current plan remains appropriate    Co-evaluation              AM-PAC PT "6 Clicks" Daily Activity  Outcome Measure  Difficulty turning over in bed (including adjusting bedclothes, sheets and  blankets)?: A Lot Difficulty moving from lying on back to sitting on the side of the bed? : A Little Difficulty sitting down on and standing up from a chair with arms (e.g., wheelchair, bedside commode, etc,.)?: A Little Help needed moving to and from a bed to chair (including a wheelchair)?: A Little Help needed walking in hospital room?: A Little Help needed climbing 3-5 steps with a railing? : A Little 6 Click Score: 17    End of Session   Activity Tolerance: Patient tolerated treatment well Patient left: in chair;with call bell/phone within reach;with chair alarm set;with family/visitor present Nurse Communication: Mobility status PT Visit Diagnosis: Difficulty in walking, not elsewhere classified (R26.2)     Time: 2023-3435 PT Time Calculation (min) (ACUTE ONLY): 23 min  Charges:  $Gait Training: 8-22 mins $Therapeutic Exercise: 8-22 mins                    G Codes:       Pg [phone]    Sherrell Weir 02/15/2017, 12:27 PM

## 2017-02-15 NOTE — Discharge Instructions (Signed)
Dr. Gaynelle Arabian Total Joint Specialist [facility] [address] [place], [place] [address] [phone]   POSTERIOR TOTAL HIP REPLACEMENT POSTOPERATIVE DIRECTIONS  Hip Rehabilitation, Guidelines Following Surgery  The results of a hip operation are greatly improved after range of motion and muscle strengthening exercises. Follow all safety measures which are given to protect your hip. If any of these exercises cause increased pain or swelling in your joint, decrease the amount until you are comfortable again. Then slowly increase the exercises. Call your caregiver if you have problems or questions.   HOME CARE INSTRUCTIONS  Remove items at home which could result in a fall. This includes throw rugs or furniture in walking pathways.   ICE to the affected hip every three hours for 30 minutes at a time and then as needed for pain and swelling.  Continue to use ice on the hip for pain and swelling from surgery. You may notice swelling that will progress down to the foot and ankle.  This is normal after surgery.  Elevate the leg when you are not up walking on it.    Continue to use the breathing machine which will help keep your temperature down.  It is common for your temperature to cycle up and down following surgery, especially at night when you are not up moving around and exerting yourself.  The breathing machine keeps your lungs expanded and your temperature down.  DIET You may resume your previous home diet once your are discharged from the hospital.  DRESSING / WOUND CARE / SHOWERING You may shower 3 days after surgery, but keep the wounds dry during showering.  You may use an occlusive plastic wrap (Press'n Seal for example), NO SOAKING/SUBMERGING IN THE BATHTUB.  If the bandage gets wet, change with a clean dry gauze.  If the incision gets wet, pat the wound dry with a clean towel. You may start showering once you are discharged home but do not submerge  the incision under water. Just pat the incision dry and apply a dry gauze dressing on daily. Change the surgical dressing daily and reapply a dry dressing each time.    ACTIVITY Walk with your walker as instructed. Use walker as long as suggested by your caregivers. Avoid periods of inactivity such as sitting longer than an hour when not asleep. This helps prevent blood clots.  You may resume a sexual relationship in one month or when given the OK by your doctor.  You may return to work once you are cleared by your doctor.  Do not drive a car for 6 weeks or until released by you surgeon.  Do not drive while taking narcotics.  WEIGHT BEARING Weight bearing as tolerated with assist device (walker, cane, etc) as directed, use it as long as suggested by your surgeon or therapist, typically at least 4-6 weeks.  POSTOPERATIVE CONSTIPATION PROTOCOL Constipation - defined medically as fewer than three stools per week and severe constipation as less than one stool per week.  One of the most common issues patients have following surgery is constipation.  Even if you have a regular bowel pattern at home, your normal regimen is likely to be disrupted due to multiple reasons following surgery.  Combination of anesthesia, postoperative narcotics, change in appetite and fluid intake all can affect your bowels.  In order to avoid complications following surgery, here are some recommendations in order to help you during your recovery period.  Colace (docusate) - Pick up an over-the-counter  form of Colace or another stool softener and take twice a day as long as you are requiring postoperative pain medications.  Take with a full glass of water daily.  If you experience loose stools or diarrhea, hold the colace until you stool forms back up.  If your symptoms do not get better within 1 week or if they get worse, check with your doctor.  Dulcolax (bisacodyl) - Pick up over-the-counter and take as directed by the  product packaging as needed to assist with the movement of your bowels.  Take with a full glass of water.  Use this product as needed if not relieved by Colace only.   MiraLax (polyethylene glycol) - Pick up over-the-counter to have on hand.  MiraLax is a solution that will increase the amount of water in your bowels to assist with bowel movements.  Take as directed and can mix with a glass of water, juice, soda, coffee, or tea.  Take if you go more than two days without a movement. Do not use MiraLax more than once per day. Call your doctor if you are still constipated or irregular after using this medication for 7 days in a row.  If you continue to have problems with postoperative constipation, please contact the office for further assistance and recommendations.  If you experience "the worst abdominal pain ever" or develop nausea or vomiting, please contact the office immediatly for further recommendations for treatment.  ITCHING  If you experience itching with your medications, try taking only a single pain pill, or even half a pain pill at a time.  You can also use Benadryl over the counter for itching or also to help with sleep.   TED HOSE STOCKINGS Wear the elastic stockings on both legs for three weeks following surgery during the day but you may remove then at night for sleeping.  MEDICATIONS See your medication summary on the After Visit Summary that the nursing staff will review with you prior to discharge.  You may have some home medications which will be placed on hold until you complete the course of blood thinner medication.  It is important for you to complete the blood thinner medication as prescribed by your surgeon.  Continue your approved medications as instructed at time of discharge.  PRECAUTIONS If you experience chest pain or shortness of breath - call 911 immediately for transfer to the hospital emergency department.  If you develop a fever greater that 101 F, purulent  drainage from wound, increased redness or drainage from wound, foul odor from the wound/dressing, or calf pain - CONTACT YOUR SURGEON.                                                   FOLLOW-UP APPOINTMENTS Make sure you keep all of your appointments after your operation with your surgeon and caregivers. You should call the office at the above phone number and make an appointment for approximately two weeks after the date of your surgery or on the date instructed by your surgeon outlined in the "After Visit Summary".  RANGE OF MOTION AND STRENGTHENING EXERCISES  These exercises are designed to help you keep full movement of your hip joint. Follow your caregiver's or physical therapist's instructions. Perform all exercises about fifteen times, three times per day or as directed. Exercise both hips, even if you  have had only one joint replacement. These exercises can be done on a training (exercise) mat, on the floor, on a table or on a bed. Use whatever works the best and is most comfortable for you. Use music or television while you are exercising so that the exercises are a pleasant break in your day. This will make your life better with the exercises acting as a break in routine you can look forward to.  Lying on your back, slowly slide your foot toward your buttocks, raising your knee up off the floor. Then slowly slide your foot back down until your leg is straight again.  Lying on your back spread your legs as far apart as you can without causing discomfort.  Lying on your side, raise your upper leg and foot straight up from the floor as far as is comfortable. Slowly lower the leg and repeat.  Lying on your back, tighten up the muscle in the front of your thigh (quadriceps muscles). You can do this by keeping your leg straight and trying to raise your heel off the floor. This helps strengthen the largest muscle supporting your knee.  Lying on your back, tighten up the muscles of your buttocks both  with the legs straight and with the knee bent at a comfortable angle while keeping your heel on the floor.      IF YOU ARE TRANSFERRED TO A SKILLED REHAB FACILITY If the patient is transferred to a skilled rehab facility following release from the hospital, a list of the current medications will be sent to the facility for the patient to continue.  When discharged from the skilled rehab facility, please have the facility set up the patient's [place] prior to being released. Also, the skilled facility will be responsible for providing the patient with their medications at time of release from the facility to include their pain medication, the muscle relaxants, and their blood thinner medication. If the patient is still at the rehab facility at time of the two week follow up appointment, the skilled rehab facility will also need to assist the patient in arranging follow up appointment in our office and any transportation needs.  MAKE SURE YOU:  Understand these instructions.  Get help right away if you are not doing well or get worse.    Pick up stool softner and laxative for home use following surgery while on pain medications. Do not submerge incision under water. Please use good hand washing techniques while changing dressing each day. May shower starting three days after surgery. Please use a clean towel to pat the incision dry following showers. Continue to use ice for pain and swelling after surgery. Do not use any lotions or creams on the incision until instructed by your surgeon.  Resume the Plavix 75 mg daily at home. Take a full dose 325 mg enteric coated aspirin daily for three weeks, then reduce to a baby 81 mg Aspirin daily for three additional weeks, then discontinue the aspirin.

## 2017-02-15 NOTE — Evaluation (Signed)
Physical Therapy Evaluation Patient Details Name: Ronald Stafford MRN: [id number] DOB: 1930-08-16 Today's Date: 02/15/2017   History of Present Illness  Pt s/p liner revision of L THR  Clinical Impression  Pt s/p LTHR revision and presents with decreased L LE strength/ROM, posterior THP, and post op pain limiting functional mobility.  Pt should progress to dc home with family assist.    Follow Up Recommendations Home health PT;DC plan and follow up therapy as arranged by surgeon    Equipment Recommendations  None recommended by PT    Recommendations for Other Services       Precautions / Restrictions Precautions Precautions: Fall;Posterior Hip Precaution Booklet Issued: Yes (comment) Restrictions Weight Bearing Restrictions: No Other Position/Activity Restrictions: WBAT      Mobility  Bed Mobility Overal bed mobility: Needs Assistance Bed Mobility: Supine to Sit     Supine to sit: Min guard     General bed mobility comments: cues for sequence and adherence to THP  Transfers Overall transfer level: Needs assistance Equipment used: Rolling walker (2 wheeled) Transfers: Sit to/from Stand Sit to Stand: Min guard         General transfer comment: cues for LE management, use of UEs to self assist and adherence to THP  Ambulation/Gait Ambulation/Gait assistance: Min assist;Min guard Ambulation Distance (Feet): 130 Feet Assistive device: Rolling walker (2 wheeled) Gait Pattern/deviations: Step-to pattern;Step-through pattern;Decreased step length - right;Decreased step length - left;Shuffle;Trunk flexed     General Gait Details: cues for sequence, posture and position from [employer]            Wheelchair Mobility    Modified Rankin (Stroke Patients Only)       Balance                                             Pertinent Vitals/Pain Pain Assessment: 0-10 Pain Score: 3  Pain Location: L hip Pain Descriptors / Indicators:  Aching;Sore Pain Intervention(s): Limited activity within patient's tolerance;Monitored during session;Premedicated before session;Ice applied    Home Living Family/patient expects to be discharged to:: Private residence Living Arrangements: Alone Available Help at Discharge: Family Type of Home: House Home Access: Stairs to enter   Technical brewer of Steps: 1 Home Layout: One level Home Equipment: Environmental consultant - 2 wheels;Walker - 4 wheels;Cane - single point;Wheelchair - manual      Prior Function Level of Independence: Independent               Hand Dominance        Extremity/Trunk Assessment   Upper Extremity Assessment Upper Extremity Assessment: Overall WFL for tasks assessed    Lower Extremity Assessment Lower Extremity Assessment: LLE deficits/detail LLE Deficits / Details: Strength at hip 3-/5 with AAROM at hip to 85 flex and 20 abd    Cervical / Trunk Assessment Cervical / Trunk Assessment: Normal  Communication   Communication: No difficulties  Cognition Arousal/Alertness: Awake/alert Behavior During Therapy: WFL for tasks assessed/performed;Impulsive Overall Cognitive Status: Within Functional Limits for tasks assessed                                 General Comments: cues to slow pace to adhere to THP      General Comments      Exercises Total Joint Exercises Ankle Circles/Pumps:  AROM;Both;20 reps;Supine Quad Sets: AROM;Both;10 reps;Supine Heel Slides: AAROM;Left;20 reps;Supine Hip ABduction/ADduction: AAROM;Left;15 reps;Supine   Assessment/Plan    PT Assessment Patient needs continued PT services  PT Problem List Decreased strength;Decreased range of motion;Decreased activity tolerance;Decreased mobility;Decreased knowledge of use of DME;Decreased knowledge of precautions;Pain       PT Treatment Interventions DME instruction;Gait training;Stair training;Functional mobility training;Therapeutic activities;Therapeutic  exercise;Patient/family education    PT Goals (Current goals can be found in the Care Plan section)  Acute Rehab PT Goals Patient Stated Goal: Regain IND  PT Goal Formulation: With patient Time For Goal Achievement: 02/17/17 Potential to Achieve Goals: Good    Frequency 7X/week   Barriers to discharge        Co-evaluation               AM-PAC PT "6 Clicks" Daily Activity  Outcome Measure Difficulty turning over in bed (including adjusting bedclothes, sheets and blankets)?: A Lot Difficulty moving from lying on back to sitting on the side of the bed? : A Lot Difficulty sitting down on and standing up from a chair with arms (e.g., wheelchair, bedside commode, etc,.)?: A Lot Help needed moving to and from a bed to chair (including a wheelchair)?: A Little Help needed walking in hospital room?: A Little Help needed climbing 3-5 steps with a railing? : A Little 6 Click Score: 15    End of Session   Activity Tolerance: Patient tolerated treatment well Patient left: in chair;with call bell/phone within reach;with chair alarm set Nurse Communication: Mobility status PT Visit Diagnosis: Difficulty in walking, not elsewhere classified (R26.2)    Time: 5830-9407 PT Time Calculation (min) (ACUTE ONLY): 29 min   Charges:   PT Evaluation $PT Eval Low Complexity: 1 Low PT Treatments $Therapeutic Exercise: 8-22 mins   PT G Codes:        Pg [phone]   Heidie Krall 02/15/2017, 12:20 PM

## 2017-02-15 NOTE — Progress Notes (Signed)
Patient up out of chair without calling, refuses to call states " I will get up when I want to" again ask patient to call for assistance and not get up by himself. Chair alarm on, family at bedside. Bethann Punches RN

## 2017-02-15 NOTE — Op Note (Signed)
Ronald Stafford, Ronald Stafford NO.:  [id number]  MEDICAL RECORD NO.:  [id number]  LOCATION:  [facility]                         FACILITY:  [facility]  PHYSICIAN:  Gaynelle Arabian, M.D.    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  PROCEDURE:  Left hip acetabular liner revision.  SURGEON:  Gaynelle Arabian, M.D.  ASSISTANT:  Alexzandrew L. Perkins, P.A.C.  ANESTHESIA:  General.  ESTIMATED BLOOD LOSS:  100.  DRAINS:  Hemovac x1.  COMPLICATIONS:  None.  CONDITION:  Stable to recovery.  BRIEF CLINICAL NOTE:  [name] is an 83 year old male with significant pain, left hip.  He has eccentric polyethylene wear in his acetabular liner.  He had a normal bone scan, which showed no evidence of loosening.  He has had progressively worsening pain and presents now for polyethylene liner versus total hip arthroplasty revision.  PROCEDURE IN DETAIL:  After successful administration of general anesthetic, the patient was placed in the right lateral decubitus position with the left side up and held with the hip positioner.  The left lower extremity was isolated from his perineum with plastic drapes, and prepped and draped in the usual sterile fashion.  Short posterolateral incision was made with a 10-blade through the subcutaneous tissue to the level of the fascia lata, which was incised inline with the skin incision.  Sciatic nerve was palpated and protected, and then, the posterior pseudocapsule was incised and removed off the posterior femur.  There was hematoma present in the joint as well as some significant amount of osteolytic wear debris.  The debris was removed and excess tissue around the edge of the cup was excised.  I then dislocated the hip and  removed the femoral head.  The femur was then retracted anteriorly to gain acetabular exposure.  The femoral component was well fixed and found to be in good position.  The femur was then retracted anteriorly to gain acetabular exposure and acetabular retractors were placed circumferentially.  Extraction device, the Duraloc acetabular shell was then used to remove the acetabular liner from the shell.  There was evidence of eccentric polyethylene wear in the liner.  The liner was removed, then the locking ring removed from the shell.  The shell was well fixed and found to be in good position.  There was a 56-mm acetabular shell.  We placed a new liner, which was a 36-mm internal diameter liner with a +4 neutral sizing.  The new ring was placed into the acetabular shell and then the acetabular liner, which was a 36-mm neutral, +4, was impacted into the acetabular shell.  A Freer elevator was then passed and the liner was intact and well fixed.  We then trialed femoral heads on the femoral component.  I tried a 36+ 5 first.  This reduced very easily.  I went to 36+ 8.5, which had a better reduction, but there was still a small amount of laxity.  With the 36+  12, stability was great.  There was full extension, full external rotation, 70 degrees flexion, 40 degrees of adduction and 90 degrees of internal rotation.  By placing the left leg on top of the right, the leg lengths were found now to be equal.  The hip was dislocated and trial head removed.  The permanent 36+ 11 metal head was placed on to the femoral stem.  Hip was reduced with the same stability parameters.  The wound was copiously irrigated with saline solution and then the posterior soft tissue reattached to the femur through drill holes with Ethibond suture.  A 20 mL of 0.25% Marcaine with epinephrine was injected into the subcu tissues.  Fascia lata was closed over Hemovac drain with running #1 Stratafix suture.  The subcu was  then closed with interrupted 2-0 Vicryl and subcuticular running 4-0 Monocryl.  The drain was hooked to suction.  The incision cleaned and dried, and Steri-Strips and a bulky sterile dressing applied.  He was then placed into a knee immobilizer, awakened, and transported to recovery in stable condition.  Please note that a surgical assistant is a medical necessity for this procedure.  Assistant was necessary for retraction of ligaments and vital neurovascular structures and also for proper positioning of the limb for safe and accurate removal of the old prosthesis and placement of the new prosthesis.     Gaynelle Arabian, M.D.     FA/MEDQ  D:  02/14/2017  T:  02/15/2017  Job:  [id number]

## 2017-02-15 NOTE — Progress Notes (Signed)
Discharge planning, spoke with patient at bedside. Have chosen [facility] for HH PT. Contacted [facility] for referral. Has RW and 3n1. [phone] 

## 2017-02-16 ENCOUNTER — Encounter (HOSPITAL_COMMUNITY): Payer: Self-pay | Admitting: Orthopedic Surgery

## 2017-02-22 ENCOUNTER — Encounter (HOSPITAL_COMMUNITY): Payer: Self-pay | Admitting: Emergency Medicine

## 2017-02-22 ENCOUNTER — Other Ambulatory Visit: Payer: Self-pay

## 2017-02-22 ENCOUNTER — Observation Stay (HOSPITAL_COMMUNITY)
Admission: EM | Admit: 2017-02-22 | Discharge: 2017-02-23 | Disposition: A | Discharge status: Home / Self Care | Payer: Medicare Other | Attending: Family Medicine | Admitting: Family Medicine

## 2017-02-22 ENCOUNTER — Emergency Department (HOSPITAL_COMMUNITY): Payer: Medicare Other

## 2017-02-22 DIAGNOSIS — K2980 Duodenitis without bleeding: Secondary | ICD-10-CM | POA: Insufficient documentation

## 2017-02-22 DIAGNOSIS — Z8673 Personal history of transient ischemic attack (TIA), and cerebral infarction without residual deficits: Secondary | ICD-10-CM | POA: Insufficient documentation

## 2017-02-22 DIAGNOSIS — K222 Esophageal obstruction: Secondary | ICD-10-CM | POA: Insufficient documentation

## 2017-02-22 DIAGNOSIS — Z7902 Long term (current) use of antithrombotics/antiplatelets: Secondary | ICD-10-CM | POA: Diagnosis not present

## 2017-02-22 DIAGNOSIS — R0600 Dyspnea, unspecified: Secondary | ICD-10-CM

## 2017-02-22 DIAGNOSIS — I10 Essential (primary) hypertension: Secondary | ICD-10-CM | POA: Diagnosis not present

## 2017-02-22 DIAGNOSIS — Z87891 Personal history of nicotine dependence: Secondary | ICD-10-CM | POA: Diagnosis not present

## 2017-02-22 DIAGNOSIS — Z79899 Other long term (current) drug therapy: Secondary | ICD-10-CM | POA: Insufficient documentation

## 2017-02-22 DIAGNOSIS — R7303 Prediabetes: Secondary | ICD-10-CM | POA: Diagnosis not present

## 2017-02-22 DIAGNOSIS — Z7982 Long term (current) use of aspirin: Secondary | ICD-10-CM | POA: Diagnosis not present

## 2017-02-22 DIAGNOSIS — K259 Gastric ulcer, unspecified as acute or chronic, without hemorrhage or perforation: Secondary | ICD-10-CM | POA: Diagnosis not present

## 2017-02-22 DIAGNOSIS — Z9079 Acquired absence of other genital organ(s): Secondary | ICD-10-CM | POA: Insufficient documentation

## 2017-02-22 DIAGNOSIS — D62 Acute posthemorrhagic anemia: Secondary | ICD-10-CM | POA: Diagnosis present

## 2017-02-22 DIAGNOSIS — E7800 Pure hypercholesterolemia, unspecified: Secondary | ICD-10-CM | POA: Diagnosis present

## 2017-02-22 DIAGNOSIS — E785 Hyperlipidemia, unspecified: Secondary | ICD-10-CM | POA: Diagnosis present

## 2017-02-22 DIAGNOSIS — Z79891 Long term (current) use of opiate analgesic: Secondary | ICD-10-CM | POA: Diagnosis not present

## 2017-02-22 DIAGNOSIS — K219 Gastro-esophageal reflux disease without esophagitis: Secondary | ICD-10-CM | POA: Diagnosis present

## 2017-02-22 DIAGNOSIS — K921 Melena: Secondary | ICD-10-CM | POA: Diagnosis not present

## 2017-02-22 DIAGNOSIS — D649 Anemia, unspecified: Secondary | ICD-10-CM | POA: Diagnosis not present

## 2017-02-22 DIAGNOSIS — Z96642 Presence of left artificial hip joint: Secondary | ICD-10-CM | POA: Diagnosis not present

## 2017-02-22 DIAGNOSIS — Z8546 Personal history of malignant neoplasm of prostate: Secondary | ICD-10-CM | POA: Insufficient documentation

## 2017-02-22 DIAGNOSIS — Z86718 Personal history of other venous thrombosis and embolism: Secondary | ICD-10-CM | POA: Diagnosis not present

## 2017-02-22 DIAGNOSIS — Z923 Personal history of irradiation: Secondary | ICD-10-CM | POA: Diagnosis not present

## 2017-02-22 LAB — COMPREHENSIVE METABOLIC PANEL
ALT: 17 U/L (ref 17–63)
AST: 28 U/L (ref 15–41)
Albumin: 3.8 g/dL (ref 3.5–5.0)
Alkaline Phosphatase: 58 U/L (ref 38–126)
Anion gap: 5 (ref 5–15)
BUN: 27 mg/dL — ABNORMAL HIGH (ref 6–20)
CO2: 26 mmol/L (ref 22–32)
Calcium: 8.7 mg/dL — ABNORMAL LOW (ref 8.9–10.3)
Chloride: 106 mmol/L (ref 101–111)
Creatinine, Ser: 1.02 mg/dL (ref 0.61–1.24)
GFR calc Af Amer: >60 mL/min (ref >60)
GFR calc non Af Amer: >60 mL/min (ref >60)
Glucose, Bld: 106 mg/dL — ABNORMAL HIGH (ref 65–99)
Potassium: 3.8 mmol/L (ref 3.5–5.1)
Sodium: 137 mmol/L (ref 135–145)
Total Bilirubin: 1.4 mg/dL — ABNORMAL HIGH (ref 0.3–1.2)
Total Protein: 6.9 g/dL (ref 6.5–8.1)

## 2017-02-22 LAB — CBC
HEMATOCRIT: 32.2 % — AB (ref 39.0–52.0)
Hemoglobin: 10.4 g/dL — ABNORMAL LOW (ref 13.0–17.0)
MCH: 31.2 pg (ref 26.0–34.0)
MCHC: 32.3 g/dL (ref 30.0–36.0)
MCV: 96.7 fL (ref 78.0–100.0)
Platelets: 197 10*3/uL (ref 150–400)
RBC: 3.33 MIL/uL — ABNORMAL LOW (ref 4.22–5.81)
RDW: 14 % (ref 11.5–15.5)
WBC: 7.2 10*3/uL (ref 4.0–10.5)

## 2017-02-22 LAB — CBC WITH DIFFERENTIAL/PLATELET
Basophils Absolute: 0.1 10*3/uL (ref 0.0–0.1)
Basophils Relative: 1 %
Eosinophils Absolute: 0.1 10*3/uL (ref 0.0–0.7)
Eosinophils Relative: 1 %
HCT: 34 % — ABNORMAL LOW (ref 39.0–52.0)
Hemoglobin: 10.9 g/dL — ABNORMAL LOW (ref 13.0–17.0)
Lymphocytes Relative: 14 %
Lymphs Abs: 1 10*3/uL (ref 0.7–4.0)
MCH: 30.8 pg (ref 26.0–34.0)
MCHC: 32.1 g/dL (ref 30.0–36.0)
MCV: 96 fL (ref 78.0–100.0)
Monocytes Absolute: 1.1 10*3/uL — ABNORMAL HIGH (ref 0.1–1.0)
Monocytes Relative: 17 %
Neutro Abs: 4.5 10*3/uL (ref 1.7–7.7)
Neutrophils Relative %: 67 %
Platelets: 224 10*3/uL (ref 150–400)
RBC: 3.54 MIL/uL — ABNORMAL LOW (ref 4.22–5.81)
RDW: 13.8 % (ref 11.5–15.5)
WBC: 6.7 10*3/uL (ref 4.0–10.5)

## 2017-02-22 LAB — TYPE AND SCREEN
ABO/RH(D): A POS
Antibody Screen: NEGATIVE

## 2017-02-22 LAB — RETICULOCYTES
RBC.: 3.42 MIL/uL — AB (ref 4.22–5.81)
RETIC COUNT ABSOLUTE: 99.2 10*3/uL (ref 19.0–186.0)
Retic Ct Pct: 2.9 % (ref 0.4–3.1)

## 2017-02-22 LAB — FOLATE: Folate: 21.4 ng/mL (ref >5.9)

## 2017-02-22 LAB — TROPONIN I: Troponin I: <0.03 ng/mL (ref <0.03)

## 2017-02-22 LAB — IRON AND TIBC
Iron: 40 ug/dL — ABNORMAL LOW (ref 45–182)
Saturation Ratios: 13 % — ABNORMAL LOW (ref 17.9–39.5)
TIBC: 297 ug/dL (ref 250–450)
UIBC: 257 ug/dL

## 2017-02-22 LAB — FERRITIN: FERRITIN: 78 ng/mL (ref 24–336)

## 2017-02-22 LAB — VITAMIN B12: Vitamin B-12: 390 pg/mL (ref 180–914)

## 2017-02-22 MED ORDER — SODIUM CHLORIDE 0.9 % IV SOLN
8.0000 mg/h | INTRAVENOUS | Status: DC
  Administered 2017-02-22 – 2017-02-23 (×2): 8 mg/h via INTRAVENOUS
  Filled 2017-02-22 (×4): qty 80

## 2017-02-22 MED ORDER — SODIUM CHLORIDE 0.9 % IV SOLN
80.0000 mg | Freq: Once | INTRAVENOUS | Status: AC
  Administered 2017-02-22: 80 mg via INTRAVENOUS
  Filled 2017-02-22: qty 80

## 2017-02-22 MED ORDER — SODIUM CHLORIDE 0.45 % IV SOLN: INTRAVENOUS | Status: DC

## 2017-02-22 MED ORDER — ACETAMINOPHEN 325 MG PO TABS: 650.0000 mg | ORAL_TABLET | Freq: Four times a day (QID) | ORAL | Status: DC | PRN

## 2017-02-22 MED ORDER — ATORVASTATIN CALCIUM 40 MG PO TABS: 40.0000 mg | ORAL_TABLET | Freq: Every day | ORAL | Status: DC

## 2017-02-22 MED ORDER — ONDANSETRON HCL 4 MG/2ML IJ SOLN: 4.0000 mg | Freq: Four times a day (QID) | INTRAMUSCULAR | Status: DC | PRN

## 2017-02-22 MED ORDER — METHOCARBAMOL 500 MG PO TABS: 500.0000 mg | ORAL_TABLET | Freq: Four times a day (QID) | ORAL | Status: DC | PRN

## 2017-02-22 MED ORDER — PANTOPRAZOLE SODIUM 40 MG IV SOLR: 40.0000 mg | Freq: Two times a day (BID) | INTRAVENOUS | Status: DC

## 2017-02-22 MED ORDER — SENNOSIDES-DOCUSATE SODIUM 8.6-50 MG PO TABS: 1.0000 | ORAL_TABLET | Freq: Every evening | ORAL | Status: DC | PRN

## 2017-02-22 MED ORDER — TRAMADOL HCL 50 MG PO TABS: 50.0000 mg | ORAL_TABLET | Freq: Four times a day (QID) | ORAL | Status: DC | PRN

## 2017-02-22 MED ORDER — AMLODIPINE BESYLATE 5 MG PO TABS: 5.0000 mg | ORAL_TABLET | Freq: Every day | ORAL | Status: DC

## 2017-02-22 MED ORDER — GABAPENTIN 100 MG PO CAPS: 100.0000 mg | ORAL_CAPSULE | Freq: Every day | ORAL | Status: DC | PRN

## 2017-02-22 MED ORDER — SODIUM CHLORIDE 0.9% FLUSH
3.0000 mL | Freq: Two times a day (BID) | INTRAVENOUS | Status: DC
  Administered 2017-02-22 – 2017-02-23 (×2): 3 mL via INTRAVENOUS

## 2017-02-22 MED ORDER — ONDANSETRON HCL 4 MG PO TABS: 4.0000 mg | ORAL_TABLET | Freq: Four times a day (QID) | ORAL | Status: DC | PRN

## 2017-02-22 MED ORDER — SODIUM CHLORIDE 0.9 % IV SOLN
INTRAVENOUS | Status: DC
  Administered 2017-02-22 – 2017-02-23 (×2): via INTRAVENOUS

## 2017-02-22 MED ORDER — LACTATED RINGERS IV BOLUS (SEPSIS)
1000.0000 mL | Freq: Once | INTRAVENOUS | Status: AC
  Administered 2017-02-22: 1000 mL via INTRAVENOUS

## 2017-02-22 MED ORDER — ALBUTEROL SULFATE (2.5 MG/3ML) 0.083% IN NEBU
5.0000 mg | INHALATION_SOLUTION | Freq: Once | RESPIRATORY_TRACT | Status: AC
  Administered 2017-02-22: 5 mg via RESPIRATORY_TRACT
  Filled 2017-02-22: qty 6

## 2017-02-22 NOTE — H&P (Signed)
History and Physical   Ronald Stafford QBH:[id number] DOB: 03-16-1930 DOA: 02/22/2017  Referring MD/NP/PA: Wilson Singer PCP: Binnie Rail, MD Outpatient Specialists: Neurology, Tomi Likens. Orthopedics, Aluisio.   Patient coming from: Home  Chief Complaint: Lightheadedness, dyspnea on exertion, dark stools  HPI: Ronald Stafford is a 83 y.o. male with a history of HTN, HLD, GERD not on PPI, colon polyps, prediabetes, perioperative DVT in 1997, TIA on plavix, and recent left THA exchange 12/5 by Dr. Wynelle Link who presented to the ED 12/13 for worsening dyspnea on exertion and lightheadedness. He was discharged after recent orthopedic procedure with aspirin added to plavix, denies taking any NSAIDs or EtOH. Since discharge he had been doing well until this morning when he noticed lightheaded feelings when arising from seated/laying posture and dyspnea only on exertion that has worsened to severe throughout today prompting ED visit. He reported mild constipation related to taking oxycodone briefly for postoperative pain and states the stool this morning was very dark without red blood. No stools since.   ED Course: On arrival he was in no distress, stable vital signs without hypoxia, mild sinus tachycardia (101bpm) on ECG without ischemic changes. Troponin negative. CXR was negative. Hgb down to 10.9 from 13.2 at recent discharge (which had trended down postoperatively from 15.9g/dl) with normal indices. Cr mildly up from previously at 1.02 with elevated BUN of 27. Platelets 224 from previously low baseline (120's). Protonix infusion started. Type and screen was sent, [facility] was consulted, and hospitalist asked to admit for symptomatic anemia, possible upper GI bleed.   Review of Systems: Confirms that he's had poor appetite today with vague midline abdominal pain that is intermittent without aggravating or alleviating factors for which he took no medications. Denies any nausea, vomiting, rotational vertigo, fever, chills,  weight loss, changes in vision or hearing, headache, cough, sore throat, chest pain, palpitations, change in bladder habits, myalgias, arthralgias, unusual bleeding or bruising, and rash. All others reviewed and are negative.   Past Medical History:  Diagnosis Date  . Cancer [facility]) 2004   prostate cancer, Dr. Risa Grill  . Colon polyps   . Deep venous thrombosis ([place]) 1997   Postop  . Depression   . GERD (gastroesophageal reflux disease)   . Hyperlipidemia   . Hypertension   . Shoulder fracture, left 02/23/2011   immobilized in sling , Dr Norris(no surgery)   Past Surgical History:  Procedure Laterality Date  . COLONOSCOPY  2006   Negative,Dr.Edwards  . colonoscopy with polypectomy      PMH of  . JOINT REPLACEMENT  1997, 2001   Total Hip replacement X 2  . LUMBAR SPINE SURGERY  09/28/2010   Dr Rolena Infante; for spinal stenosis  . MECKEL DIVERTICULUM EXCISION     Age 17  . PROSTATECTOMY  2004   w/ radiation, Dr Risa Grill  . TOTAL HIP REVISION Left 02/14/2017   Procedure: Left hip polyethylene EXCHANGE;  Surgeon: Gaynelle Arabian, MD;  Location: [facility];  Service: Orthopedics;  Laterality: Left;  [name] VASECTOMY     - Reports he quit smoking 50 years ago, no EtOH, never any illicit drugs. He is very active around his house, having recently put in >342ft fencing, new driveway, and cleared out the woods.   reports that he quit smoking about 28 years ago. His smoking use included cigarettes. He has a 2.50 pack-year smoking history. He has quit using smokeless tobacco. He reports that he does not drink alcohol or use drugs. Allergies  Allergen Reactions  .  Chicken Allergy Nausea And Vomiting and Other (See Comments)    Digestive problems   Family History  Problem Relation Age of Onset  . Prostate cancer Father   . Testicular cancer Son    - Family history otherwise reviewed and not pertinent. Prior to Admission medications   Medication Sig Start Date End Date Taking? Authorizing Provider    acetaminophen (TYLENOL) 325 MG tablet Take 650 mg by mouth every 6 (six) hours as needed for moderate pain or headache.    Yes [provider]  amLODipine (NORVASC) 5 MG tablet Take 1 tablet (5 mg total) by mouth daily. 06/27/16  Yes Burns, Claudina Lick, MD  aspirin EC 325 MG EC tablet Take 1 tablet (325 mg total) by mouth daily with breakfast. Take a full dose 325 mg enteric coated aspirin daily for three weeks, then reduce to a baby 81 mg Aspirin daily for three additional weeks, then discontinue the aspirin. 02/15/17  Yes Perkins, Alexzandrew L, PA-C  atorvastatin (LIPITOR) 40 MG tablet Take 1 tablet (40 mg total) by mouth daily. 06/27/16  Yes Burns, Claudina Lick, MD  gabapentin (NEURONTIN) 100 MG capsule Take 1 capsule (100 mg total) by mouth at bedtime. -- Office visit needed for further refills Patient taking differently: Take 100 mg by mouth daily.  11/09/16  Yes Burns, Claudina Lick, MD  methocarbamol (ROBAXIN) 500 MG tablet Take 1 tablet (500 mg total) by mouth every 6 (six) hours as needed for muscle spasms. 02/15/17  Yes Perkins, Alexzandrew L, PA-C  traMADol (ULTRAM) 50 MG tablet Take 1-2 tablets (50-100 mg total) by mouth every 6 (six) hours as needed for moderate pain. 02/15/17  Yes Perkins, Alexzandrew L, PA-C  vitamin C (ASCORBIC ACID) 250 MG tablet Take 250 mg by mouth daily.   Yes [provider]  clopidogrel (PLAVIX) 75 MG tablet Take 1 tablet (75 mg total) by mouth daily. 06/27/16   Binnie Rail, MD  oxyCODONE (OXY IR/ROXICODONE) 5 MG immediate release tablet Take 1-2 tablets (5-10 mg total) by mouth every 4 (four) hours as needed for moderate pain or severe pain. 02/15/17   Joelene Millin, PA-C    Physical Exam: Vitals:   02/22/17 1505 02/22/17 1615 02/22/17 1720  BP:   (!) 141/58  Pulse:   79  Resp:   10  SpO2:  99% 99%  Weight: 84.8 kg (187 lb)    Height: 6\' 2"  (1.88 m)     Constitutional: Pleasant, nontoxic elderly male in no distress, calm demeanor Eyes: Lids  and conjunctivae normal, PERRL ENMT: Mucous membranes are moist. Posterior pharynx clear of any exudate or lesions. Fair dentition.  Neck: normal, supple, no masses, no thyromegaly Respiratory: Non-labored breathing room air without accessory muscle use. Clear breath sounds to auscultation bilaterally Cardiovascular: Regular rate and rhythm, no murmurs, rubs, or gallops. No carotid bruits. No JVD. No LE edema. 2+ pedal pulses. Abdomen: Normoactive bowel sounds. No tenderness, non-distended, and no masses palpated. No hepatosplenomegaly. GU: No indwelling catheter Musculoskeletal: No clubbing / cyanosis. No joint deformity upper and lower extremities. Good ROM, no contractures. Normal muscle tone.  Skin: Warm, dry. Left lateral hip with well-healing incision with steristrips. No erythema or discharge. Moderate ecchymosis around the area.  Neurologic: CN II-XII grossly intact. Gait not assessed. Speech normal. No focal deficits in motor strength or sensation in all extremities.  Psychiatric: Alert and oriented x3. Normal judgment and insight. Mood euthymic with congruent affect.   Labs on Admission: I have personally  reviewed following labs and imaging studies  CBC: Recent Labs  Lab 02/22/17 1606  WBC 6.7  NEUTROABS 4.5  HGB 10.9*  HCT 34.0*  MCV 96.0  PLT 010   Basic Metabolic Panel: Recent Labs  Lab 02/22/17 1606  NA 137  K 3.8  CL 106  CO2 26  GLUCOSE 106*  BUN 27*  CREATININE 1.02  CALCIUM 8.7*   GFR: Estimated Creatinine Clearance: 62.7 mL/min (by C-G formula based on SCr of 1.02 mg/dL). Liver Function Tests: Recent Labs  Lab 02/22/17 1606  AST 28  ALT 17  ALKPHOS 58  BILITOT 1.4*  PROT 6.9  ALBUMIN 3.8   No results for input(s): LIPASE, AMYLASE in the last 168 hours. No results for input(s): AMMONIA in the last 168 hours. Coagulation Profile: No results for input(s): INR, PROTIME in the last 168 hours. Cardiac Enzymes: Recent Labs  Lab 02/22/17 1606   TROPONINI <0.03   BNP (last 3 results) No results for input(s): PROBNP in the last 8760 hours. HbA1C: No results for input(s): HGBA1C in the last 72 hours. CBG: No results for input(s): GLUCAP in the last 168 hours. Lipid Profile: No results for input(s): CHOL, HDL, LDLCALC, TRIG, CHOLHDL, LDLDIRECT in the last 72 hours. Thyroid Function Tests: No results for input(s): TSH, T4TOTAL, FREET4, T3FREE, THYROIDAB in the last 72 hours. Anemia Panel: No results for input(s): VITAMINB12, FOLATE, FERRITIN, TIBC, IRON, RETICCTPCT in the last 72 hours. Urine analysis:    Component Value Date/Time   COLORURINE straw 05/21/2008 1424   APPEARANCEUR Clear 05/21/2008 1424   LABSPEC 1.025 05/21/2008 1424   PHURINE 6.0 05/21/2008 1424   HGBUR negative 05/21/2008 1424   BILIRUBINUR negative 05/21/2008 1424   UROBILINOGEN 0.2 05/21/2008 1424   NITRITE negative 05/21/2008 1424    No results found for this or any previous visit (from the past 240 hour(s)).   Radiological Exams on Admission: Dg Chest 2 View  Result Date: 02/22/2017 CLINICAL DATA:  Shortness of breath since hip replacement on 02/14/2017 EXAM: CHEST  2 VIEW COMPARISON:  07/30/2016 FINDINGS: Heart size is normal. There is aortic atherosclerosis. There is chronic pulmonary scarring at the left base. No sign of active infiltrate, mass, effusion or collapse. Ordinary degenerative changes affect the spine. Old healed rib fractures on the left. IMPRESSION: No active disease. Aortic atherosclerosis. Chronic left base scarring. Electronically Signed   By: Nelson Chimes M.D.   On: 02/22/2017 17:26    EKG: Independently reviewed. Sinus tachycardia, 101bpm without ST-T changes.   Assessment/Plan Active Problems:   Hyperlipidemia   Essential hypertension   GERD   Symptomatic acute blood loss anemia: Presumably combination of operative blood loss but more concerning possibility of upper GI bleeding with melena. Was noted to have +FOBT during  admission in June 2018.  - Send anemia panel - Serial CBC - Check FOBT - [facility] consulted by EDP (sees Dr. Oletta Lamas). Will give clear liquids tonight, NPO p MN.  - Protonix gtt - Holding ASA, plavix   s/p left hip THA revision:  - Pain control with tylenol and tramadol prn.   HTN: Stable.  - VSS, continue home medications  Hyperlipidemia:  - Continue statin  Prediabetes: HbA1c 5.8% - Monitor glucose with lab work, add coverage as needed.   GERD:  - PPI as above  DVT prophylaxis: SCDs, encourage ambulation (h/o periop LE DVT)  Code Status: Full  Family Communication: None at bedside Disposition Plan: DC home once work up and management complete Consults  called: GI by EDP  Admission status: Observation    Vance Gather, MD [facility] Pager [phone]  If 7PM-7AM, please contact night-coverage [email or web address] Password [email or web address] 02/22/2017, 6:20 PM

## 2017-02-22 NOTE — ED Notes (Signed)
ED TO INPATIENT HANDOFF REPORT  Name/Age/Gender Olene Craven Toomey 83 y.o. male  Code Status Code Status History    Date Active Date Inactive Code Status Order ID Comments User Context   02/14/2017 15:38 02/15/2017 16:03 Full Code [id number]  Gaynelle Arabian, MD Inpatient   07/30/2016 15:09 07/31/2016 16:37 Full Code [id number]  Elease Hashimoto ED   09/25/2014 20:34 09/27/2014 17:26 Full Code [id number]  Reubin Milan, MD Inpatient    Advance Directive Documentation     Most Recent Value  Type of Advance Directive  Healthcare Power of Attorney, Living will  Pre-existing out of facility DNR order (yellow form or pink MOST form)  No data  "MOST" Form in Place?  No data      Home/SNF/Other Home  Chief Complaint diff breathing  Level of Care/Admitting Diagnosis ED Disposition    ED Disposition Condition Comment   Admit  Hospital Area: [facility] [[id number]]  Level of Care: Telemetry [5]  Admit to tele based on following criteria: Monitor for Ischemic changes  Diagnosis: Symptomatic anemia [3154008]  Admitting Physician: Patrecia Pour [phone]  Attending Physician: Patrecia Pour [phone]  PT Class (Do Not Modify): Observation [104]  PT Acc Code (Do Not Modify): Observation [10022]       Medical History Past Medical History:  Diagnosis Date  . Cancer [facility]) 2004   prostate cancer, Dr. Risa Grill  . Colon polyps   . Deep venous thrombosis ([place]) 1997   Postop  . Depression   . GERD (gastroesophageal reflux disease)   . Hyperlipidemia   . Hypertension   . Shoulder fracture, left 02/23/2011   immobilized in sling , Dr Norris(no surgery)    Allergies Allergies  Allergen Reactions  . Chicken Allergy Nausea And Vomiting and Other (See Comments)    Digestive problems    IV Location/Drains/Wounds Patient Lines/Drains/Airways Status   Active Line/Drains/Airways    Name:   Placement date:   Placement time:   Site:   Days:   Peripheral IV 02/22/17 Right Forearm    02/22/17    1724    Forearm   less than 1   Incision (Closed) 02/14/17 Hip Left   02/14/17    1233     8          Labs/Imaging Results for orders placed or performed during the hospital encounter of 02/22/17 (from the past 48 hour(s))  Troponin I     Status: None   Collection Time: 02/22/17  4:06 PM  Result Value Ref Range   Troponin I <0.03 <0.03 ng/mL  CBC with Differential     Status: Abnormal   Collection Time: 02/22/17  4:06 PM  Result Value Ref Range   WBC 6.7 4.0 - 10.5 K/uL   RBC 3.54 (L) 4.22 - 5.81 MIL/uL   Hemoglobin 10.9 (L) 13.0 - 17.0 g/dL   HCT 34.0 (L) 39.0 - 52.0 %   MCV 96.0 78.0 - 100.0 fL   MCH 30.8 26.0 - 34.0 pg   MCHC 32.1 30.0 - 36.0 g/dL   RDW 13.8 11.5 - 15.5 %   Platelets 224 150 - 400 K/uL   Neutrophils Relative % 67 %   Neutro Abs 4.5 1.7 - 7.7 K/uL   Lymphocytes Relative 14 %   Lymphs Abs 1.0 0.7 - 4.0 K/uL   Monocytes Relative 17 %   Monocytes Absolute 1.1 (H) 0.1 - 1.0 K/uL   Eosinophils Relative 1 %   Eosinophils Absolute 0.1  0.0 - 0.7 K/uL   Basophils Relative 1 %   Basophils Absolute 0.1 0.0 - 0.1 K/uL  Comprehensive metabolic panel     Status: Abnormal   Collection Time: 02/22/17  4:06 PM  Result Value Ref Range   Sodium 137 135 - 145 mmol/L   Potassium 3.8 3.5 - 5.1 mmol/L   Chloride 106 101 - 111 mmol/L   CO2 26 22 - 32 mmol/L   Glucose, Bld 106 (H) 65 - 99 mg/dL   BUN 27 (H) 6 - 20 mg/dL   Creatinine, Ser 1.02 0.61 - 1.24 mg/dL   Calcium 8.7 (L) 8.9 - 10.3 mg/dL   Total Protein 6.9 6.5 - 8.1 g/dL   Albumin 3.8 3.5 - 5.0 g/dL   AST 28 15 - 41 U/L   ALT 17 17 - 63 U/L   Alkaline Phosphatase 58 38 - 126 U/L   Total Bilirubin 1.4 (H) 0.3 - 1.2 mg/dL   GFR calc non Af Amer >60 >60 mL/min   GFR calc Af Amer >60 >60 mL/min    Comment: (NOTE) The eGFR has been calculated using the CKD EPI equation. This calculation has not been validated in all clinical situations. eGFR's persistently <60 mL/min signify possible Chronic  Kidney Disease.    Anion gap 5 5 - 15  Type and screen [place]     Status: None   Collection Time: 02/22/17  4:06 PM  Result Value Ref Range   ABO/RH(D) A POS    Antibody Screen NEG    Sample Expiration 02/25/2017   Reticulocytes     Status: Abnormal   Collection Time: 02/22/17  4:06 PM  Result Value Ref Range   Retic Ct Pct 2.9 0.4 - 3.1 %   RBC. 3.42 (L) 4.22 - 5.81 MIL/uL   Retic Count, Absolute 99.2 19.0 - 186.0 K/uL   Dg Chest 2 View  Result Date: 02/22/2017 CLINICAL DATA:  Shortness of breath since hip replacement on 02/14/2017 EXAM: CHEST  2 VIEW COMPARISON:  07/30/2016 FINDINGS: Heart size is normal. There is aortic atherosclerosis. There is chronic pulmonary scarring at the left base. No sign of active infiltrate, mass, effusion or collapse. Ordinary degenerative changes affect the spine. Old healed rib fractures on the left. IMPRESSION: No active disease. Aortic atherosclerosis. Chronic left base scarring. Electronically Signed   By: Nelson Chimes M.D.   On: 02/22/2017 17:26    Pending Labs Unresulted Labs (From admission, onward)   Start     Ordered   02/22/17 1838  Vitamin B12  (Anemia Panel (PNL))  Once,   R     02/22/17 1837   02/22/17 1838  Folate  (Anemia Panel (PNL))  Once,   R     02/22/17 1837   02/22/17 1838  Iron and TIBC  (Anemia Panel (PNL))  Once,   R     02/22/17 1837   02/22/17 1838  Ferritin  (Anemia Panel (PNL))  Once,   R     02/22/17 1837   02/22/17 1551  Occult blood card to lab, stool  Once,   STAT     02/22/17 1550   Signed and Held  Comprehensive metabolic panel  Tomorrow morning,   R     Signed and Held   Signed and Held  CBC  Now then every 8 hours,   R     Signed and Held   Signed and Held  Protime-INR  Tomorrow morning,   R  Signed and Held      Vitals/Pain Today's Vitals   02/22/17 1615 02/22/17 1720 02/22/17 1750 02/22/17 1950  BP:  (!) 141/58  (!) 143/58  Pulse:  79  75  Resp:  10  12  SpO2: 99% 99%   93%  Weight:      Height:      PainSc:   0-No pain     Isolation Precautions No active isolations  Medications Medications  albuterol (PROVENTIL) (2.5 MG/3ML) 0.083% nebulizer solution 5 mg (5 mg Nebulization Given 02/22/17 1615)  pantoprazole (PROTONIX) 80 mg in sodium chloride 0.9 % 100 mL IVPB (0 mg Intravenous Stopped 02/22/17 1744)  lactated ringers bolus 1,000 mL (0 mLs Intravenous Stopped 02/22/17 1924)    Mobility walks with device

## 2017-02-22 NOTE — ED Provider Notes (Signed)
[facility] DEPT Provider Note   CSN: [id number] Arrival date & time: 02/22/17  1450     History   Chief Complaint Chief Complaint  Patient presents with  . Shortness of Breath  . swimmy headed    HPI Ronald Stafford is a 83 y.o. male.  HPI  83 year old male with generalized weakness.  He is status post left hip revision on 12/5.  He reports that he had been recovering well up until today.  He feels dyspneic with ambulation.  Feels "swimmy headed" with standing and light exertion. Denies CP. He was discharged on aspirin and Plavix.  He reports black colored stools within the past day.  Vague periumbilical pain and anorexia.  Denies past hx of UGIB.  Prevsiusly seen by Dr. Oletta Lamas, GI. Past hx of GERD. Noted on endoscopy 01/2007.  Denies significant NSAID usage. Not on PPI currently.      Past Medical History:  Diagnosis Date  . Cancer [facility]) 2004   prostate cancer, Dr. Risa Grill  . Colon polyps   . Deep venous thrombosis ([place]) 1997   Postop  . Depression   . GERD (gastroesophageal reflux disease)   . Hyperlipidemia   . Hypertension   . Shoulder fracture, left 02/23/2011   immobilized in sling , Dr Norris(no surgery)    Patient Active Problem List   Diagnosis Date Noted  . Failed total hip arthroplasty ([place]) 02/14/2017  . Pre-op examination 12/11/2016  . Osteoarthritis of left hip 12/10/2016  . Acute right ankle pain 11/07/2016  . Acute pain of left shoulder 10/04/2016  . Chest pain 07/30/2016  . History of DVT (deep vein thrombosis) 07/30/2016  . TIA (transient ischemic attack) 07/30/2016  . Thrombocytopenia ([place]) 07/30/2016  . Carotid arterial disease ([facility]) 07/20/2016  . Right sided weakness 06/27/2016  . Blurry vision 06/27/2016  . Slurred speech 06/27/2016  . Cough 01/26/2016  . Left wrist pain 08/11/2015  . Frequent headaches 06/22/2015  . Prediabetes 02/11/2015  . Episodic paroxysmal hemicrania, not intractable 02/11/2015  .  Occult blood positive stool 09/27/2014  . CAP (community acquired pneumonia) 09/25/2014  . Peroneal tendonitis of right lower extremity 01/12/2014  . Mass of right ankle 12/31/2013  . Right ankle instability 12/31/2013  . Radial head fracture, closed 09/18/2013  . Lateral epicondylitis of right elbow 09/16/2013  . Solitary pulmonary nodule 08/08/2012  . SPINAL STENOSIS, LUMBAR 04/06/2010  . LOW BACK PAIN SYNDROME 11/02/2009  . FOOT DROP, RIGHT 05/21/2008  . Other nonthrombocytopenic purpuras 10/21/2007  . Hyperlipidemia 12/25/2006  . Depression 12/25/2006  . Essential hypertension 12/25/2006  . GERD 12/25/2006  . PROSTATE CANCER, HX OF 12/25/2006  . POLYP, COLON 05/02/2006    Past Surgical History:  Procedure Laterality Date  . COLONOSCOPY  2006   Negative,Dr.Edwards  . colonoscopy with polypectomy      PMH of  . JOINT REPLACEMENT  1997, 2001   Total Hip replacement X 2  . LUMBAR SPINE SURGERY  09/28/2010   Dr Rolena Infante; for spinal stenosis  . MECKEL DIVERTICULUM EXCISION     Age 22  . PROSTATECTOMY  2004   w/ radiation, Dr Risa Grill  . TOTAL HIP REVISION Left 02/14/2017   Procedure: Left hip polyethylene EXCHANGE;  Surgeon: Gaynelle Arabian, MD;  Location: [facility] ORS;  Service: Orthopedics;  Laterality: Left;  [name] VASECTOMY         Home Medications    Prior to Admission medications   Medication Sig Start Date End Date Taking? Authorizing Provider  acetaminophen (TYLENOL) 325 MG tablet Take 650 mg by mouth every 6 (six) hours as needed for moderate pain or headache.     [provider]  amLODipine (NORVASC) 5 MG tablet Take 1 tablet (5 mg total) by mouth daily. 06/27/16   Binnie Rail, MD  aspirin EC 325 MG EC tablet Take 1 tablet (325 mg total) by mouth daily with breakfast. Take a full dose 325 mg enteric coated aspirin daily for three weeks, then reduce to a baby 81 mg Aspirin daily for three additional weeks, then discontinue the aspirin. 02/15/17   Perkins, Alexzandrew  L, PA-C  atorvastatin (LIPITOR) 40 MG tablet Take 1 tablet (40 mg total) by mouth daily. 06/27/16   Binnie Rail, MD  clopidogrel (PLAVIX) 75 MG tablet Take 1 tablet (75 mg total) by mouth daily. 06/27/16   Binnie Rail, MD  gabapentin (NEURONTIN) 100 MG capsule Take 1 capsule (100 mg total) by mouth at bedtime. -- Office visit needed for further refills Patient taking differently: Take 100 mg by mouth daily.  11/09/16   Binnie Rail, MD  methocarbamol (ROBAXIN) 500 MG tablet Take 1 tablet (500 mg total) by mouth every 6 (six) hours as needed for muscle spasms. 02/15/17   Perkins, Alexzandrew L, PA-C  oxyCODONE (OXY IR/ROXICODONE) 5 MG immediate release tablet Take 1-2 tablets (5-10 mg total) by mouth every 4 (four) hours as needed for moderate pain or severe pain. 02/15/17   Perkins, Alexzandrew L, PA-C  traMADol (ULTRAM) 50 MG tablet Take 1-2 tablets (50-100 mg total) by mouth every 6 (six) hours as needed for moderate pain. 02/15/17   Perkins, Alexzandrew L, PA-C    Family History Family History  Problem Relation Age of Onset  . Prostate cancer Father   . Testicular cancer Son     Social History Social History   Tobacco Use  . Smoking status: Former Smoker    Packs/day: 0.50    Years: 5.00    Pack years: 2.50    Types: Cigarettes    Last attempt to quit: 03/13/1988    Years since quitting: 28.9  . Smokeless tobacco: Former Systems developer  . Tobacco comment: Quit at about age 68 / chewed tobacca x 1 year  Substance Use Topics  . Alcohol use: No    Alcohol/week: 0.0 oz  . Drug use: No     Allergies   Chicken allergy   Review of Systems Review of Systems  All systems reviewed and negative, other than as noted in HPI.  Physical Exam Updated Vital Signs Ht 6\' 2"  (1.88 m)   Wt 84.8 kg (187 lb)   BMI 24.01 kg/m   Physical Exam  Constitutional: He appears well-developed and well-nourished. No distress.  HENT:  Head: Normocephalic and atraumatic.  Eyes: Conjunctivae are normal.  Right eye exhibits no discharge. Left eye exhibits no discharge.  Neck: Neck supple.  Cardiovascular: Normal rate, regular rhythm and normal heart sounds. Exam reveals no gallop and no friction rub.  No murmur heard. Pulmonary/Chest: Effort normal and breath sounds normal. No respiratory distress.  Abdominal: Soft. He exhibits no distension. There is tenderness.  Mild epigastric tenderness without rebound or guarding.  Genitourinary:  Genitourinary Comments: Rectal: No concerning external lesions noted.  Normal tone.  No significant stool in rectal vault. Brownish/pink mucus material noted on glove.    Musculoskeletal: He exhibits no edema or tenderness.  Resolving ecchymosis to the left hip/lateral thigh.  Incision intact and appears to be healing  appropriately.  Neurological: He is alert.  Skin: Skin is warm and dry.  Psychiatric: He has a normal mood and affect. His behavior is normal. Thought content normal.  Nursing note and vitals reviewed.    ED Treatments / Results  Labs (all labs ordered are listed, but only abnormal results are displayed) Labs Reviewed - No data to display  EKG  EKG Interpretation  Date/Time:  Thursday February 22 2017 15:05:40 EST Ventricular Rate:  101 PR Interval:    QRS Duration: 81 QT Interval:  329 QTC Calculation: 427 R Axis:   69 Text Interpretation:  Sinus tachycardia Confirmed by Virgel Manifold [phone]) on 02/22/2017 3:23:00 PM       Radiology No results found.  Procedures Procedures (including critical care time)  Medications Ordered in ED Medications  lactated ringers bolus 1,000 mL (1,000 mLs Intravenous New Bag/Given 02/22/17 1724)  albuterol (PROVENTIL) (2.5 MG/3ML) 0.083% nebulizer solution 5 mg (5 mg Nebulization Given 02/22/17 1615)  pantoprazole (PROTONIX) 80 mg in sodium chloride 0.9 % 100 mL IVPB (0 mg Intravenous Stopped 02/22/17 1744)     Initial Impression / Assessment and Plan / ED Course  I have reviewed the  triage vital signs and the nursing notes.  Pertinent labs & imaging results that were available during my care of the patient were reviewed by me and considered in my medical decision making (see chart for details).    83 year old male with some exertional dyspnea and lightheadedness with standing/ambulation.  Black appearing stool for the past day although no melena on exam.  Vague periumbilical pain and anorexia. Mild epigastric tenderness on exam. Elevated BUN with normal Cr. I suspect that he may have an upper GI bleed.  He does have new anemia but I am not sure how much of that may potentially be postsurgical with his recent hip revision.    IV Protonix.  Type and screen.  Previous endoscopy by Dr. Oletta Lamas, [facility] gastroenterology.  Will page for nonemergent consultation.  Admission to the medical service.  6:27 PM Discussed briefly with Dr. Alessandra Bevels. Plan for GI to see pt in am.    Final Clinical Impressions(s) / ED Diagnoses   Final diagnoses:  Dyspnea, unspecified type  Anemia, unspecified type    ED Discharge Orders    None       Virgel Manifold, MD 02/22/17 [phone]

## 2017-02-22 NOTE — ED Triage Notes (Signed)
patient reports having left hip replacement on 12/5. Reports that he been having increased SOB and "swimmy headed, like my head is going to fall off" when he is up moving around.

## 2017-02-23 ENCOUNTER — Encounter (HOSPITAL_COMMUNITY): Payer: Self-pay | Admitting: *Deleted

## 2017-02-23 ENCOUNTER — Encounter (HOSPITAL_COMMUNITY)
Admission: EM | Disposition: A | Discharge status: Home / Self Care | Payer: Self-pay | Source: Home / Self Care | Attending: Emergency Medicine

## 2017-02-23 DIAGNOSIS — D649 Anemia, unspecified: Secondary | ICD-10-CM | POA: Diagnosis not present

## 2017-02-23 DIAGNOSIS — D62 Acute posthemorrhagic anemia: Secondary | ICD-10-CM

## 2017-02-23 DIAGNOSIS — R0600 Dyspnea, unspecified: Secondary | ICD-10-CM | POA: Diagnosis not present

## 2017-02-23 DIAGNOSIS — K219 Gastro-esophageal reflux disease without esophagitis: Secondary | ICD-10-CM | POA: Diagnosis not present

## 2017-02-23 HISTORY — PX: ESOPHAGOGASTRODUODENOSCOPY: SHX5428

## 2017-02-23 LAB — PROTIME-INR
INR: 1.09
PROTHROMBIN TIME: 14 s (ref 11.4–15.2)

## 2017-02-23 LAB — CBC
HCT: 31.5 % — ABNORMAL LOW (ref 39.0–52.0)
HCT: 32.3 % — ABNORMAL LOW (ref 39.0–52.0)
HEMOGLOBIN: 10.3 g/dL — AB (ref 13.0–17.0)
HEMOGLOBIN: 10.4 g/dL — AB (ref 13.0–17.0)
MCH: 31.7 pg (ref 26.0–34.0)
MCH: 31.2 pg (ref 26.0–34.0)
MCHC: 32.7 g/dL (ref 30.0–36.0)
MCHC: 32.2 g/dL (ref 30.0–36.0)
MCV: 96.9 fL (ref 78.0–100.0)
MCV: 97 fL (ref 78.0–100.0)
Platelets: 172 10*3/uL (ref 150–400)
Platelets: 180 10*3/uL (ref 150–400)
RBC: 3.25 MIL/uL — ABNORMAL LOW (ref 4.22–5.81)
RBC: 3.33 MIL/uL — ABNORMAL LOW (ref 4.22–5.81)
RDW: 14.2 % (ref 11.5–15.5)
RDW: 14.1 % (ref 11.5–15.5)
WBC: 5.6 10*3/uL (ref 4.0–10.5)
WBC: 6 10*3/uL (ref 4.0–10.5)

## 2017-02-23 LAB — COMPREHENSIVE METABOLIC PANEL
ALBUMIN: 3.4 g/dL — AB (ref 3.5–5.0)
ALK PHOS: 55 U/L (ref 38–126)
ALT: 16 U/L — AB (ref 17–63)
AST: 23 U/L (ref 15–41)
Anion gap: 5 (ref 5–15)
BILIRUBIN TOTAL: 1.5 mg/dL — AB (ref 0.3–1.2)
BUN: 18 mg/dL (ref 6–20)
CALCIUM: 8.5 mg/dL — AB (ref 8.9–10.3)
CO2: 28 mmol/L (ref 22–32)
CREATININE: 0.81 mg/dL (ref 0.61–1.24)
Chloride: 107 mmol/L (ref 101–111)
GFR calc Af Amer: >60 mL/min (ref >60)
GFR calc non Af Amer: >60 mL/min (ref >60)
GLUCOSE: 100 mg/dL — AB (ref 65–99)
Potassium: 3.9 mmol/L (ref 3.5–5.1)
SODIUM: 140 mmol/L (ref 135–145)
TOTAL PROTEIN: 6.2 g/dL — AB (ref 6.5–8.1)

## 2017-02-23 SURGERY — EGD (ESOPHAGOGASTRODUODENOSCOPY)
Anesthesia: Moderate Sedation

## 2017-02-23 MED ORDER — SODIUM CHLORIDE 0.9 % IV SOLN: INTRAVENOUS | Status: DC

## 2017-02-23 MED ORDER — PANTOPRAZOLE SODIUM 40 MG PO TBEC: 40.0000 mg | DELAYED_RELEASE_TABLET | Freq: Two times a day (BID) | ORAL | 0 refills | Status: DC

## 2017-02-23 MED ORDER — ACETAMINOPHEN 325 MG PO TABS: 650.0000 mg | ORAL_TABLET | Freq: Four times a day (QID) | ORAL | Status: DC | PRN

## 2017-02-23 MED ORDER — MIDAZOLAM HCL 5 MG/ML IJ SOLN
INTRAMUSCULAR | Status: AC
  Filled 2017-02-23: qty 2

## 2017-02-23 MED ORDER — MIDAZOLAM HCL 10 MG/2ML IJ SOLN
INTRAMUSCULAR | Status: DC | PRN
Start: 2017-02-23 — End: 2017-02-23
  Administered 2017-02-23: 1 mg via INTRAVENOUS
  Administered 2017-02-23: 2 mg via INTRAVENOUS
  Administered 2017-02-23: 1 mg via INTRAVENOUS

## 2017-02-23 MED ORDER — FENTANYL CITRATE (PF) 100 MCG/2ML IJ SOLN
INTRAMUSCULAR | Status: DC | PRN
  Administered 2017-02-23: 25 ug via INTRAVENOUS

## 2017-02-23 MED ORDER — FENTANYL CITRATE (PF) 100 MCG/2ML IJ SOLN
INTRAMUSCULAR | Status: AC
  Filled 2017-02-23: qty 2

## 2017-02-23 MED ORDER — PANTOPRAZOLE SODIUM 40 MG IV SOLR
40.0000 mg | Freq: Two times a day (BID) | INTRAVENOUS | Status: DC
  Administered 2017-02-23: 40 mg via INTRAVENOUS
  Filled 2017-02-23 (×2): qty 40

## 2017-02-23 MED ORDER — BUTAMBEN-TETRACAINE-BENZOCAINE 2-2-14 % EX AERO
INHALATION_SPRAY | CUTANEOUS | Status: DC | PRN
Start: 2017-02-23 — End: 2017-02-23
  Administered 2017-02-23: 2 via TOPICAL

## 2017-02-23 NOTE — Discharge Planning (Signed)
Patient's IV removed. RN assessment and VS revealed stability for DC to home with resumption of PT.  Discharge papers given, explained and educated.  Informed of suggested FU appt and appts made - PCP 12/27 @ 10AM, Ortho 12/19 @ 1015 and GI 1/14 @ 9AM.  Script e-scribed to [employer] ([facility] in [place]). Once ready, will be wheeled to front and family transporting home via car.

## 2017-02-23 NOTE — Op Note (Signed)
[facility] Patient Name: Ronald Stafford Procedure Date: 02/23/2017 MRN: [id number] Attending MD: Otis Brace , MD Date of Birth: 04-Sep-1930 CSN: [id number] Age: 83 Admit Type: Inpatient Procedure:                Upper GI endoscopy Indications:              Melena Providers:                Otis Brace, MD, Elmer Ramp. Hinson, RN, Cherylynn Ridges, Technician Referring MD:              Medicines:                Fentanyl 25 micrograms IV, Midazolam 4 mg IV Complications:            No immediate complications. Estimated Blood Loss:     Estimated blood loss: none. Procedure:                Pre-Anesthesia Assessment:                           - Prior to the procedure, a History and Physical                            was performed, and patient medications and                            allergies were reviewed. The patient's tolerance of                            previous anesthesia was also reviewed. The risks                            and benefits of the procedure and the sedation                            options and risks were discussed with the patient.                            All questions were answered, and informed consent                            was obtained. Prior Anticoagulants: The patient has                            taken Plavix (clopidogrel), last dose was 1 day                            prior to procedure. ASA Grade Assessment: II - A                            patient with mild systemic disease. After reviewing  the risks and benefits, the patient was deemed in                            satisfactory condition to undergo the procedure.                           After obtaining informed consent, the endoscope was                            passed under direct vision. Throughout the                            procedure, the patient's blood pressure, pulse, and                            oxygen  saturations were monitored continuously. The                            EG-[id number] [phone]) scope was introduced through the                            mouth, and advanced to the second part of duodenum.                            The upper GI endoscopy was accomplished without                            difficulty. The patient tolerated the procedure                            well. Scope In: Scope Out: Findings:      A non-obstructing Schatzki ring (acquired) was found at the       gastroesophageal junction.      The exam of the esophagus was otherwise normal.      Two non-bleeding superficial gastric ulcers with no stigmata of bleeding       were found in the prepyloric region of the stomach. Biopsy is       contraindicated because the patient is taking anticoagulation medication.      The cardia and gastric fundus were normal on retroflexion.      Scattered mild inflammation was found in the duodenal bulb.      The first portion of the duodenum and second portion of the duodenum       were normal. Impression:               - Non-obstructing Schatzki ring.                           - Non-bleeding gastric ulcers with no stigmata of                            bleeding. Biopsy is contraindicated.                           - Duodenitis.                           -  Normal first portion of the duodenum and second                            portion of the duodenum.                           - No specimens collected. Moderate Sedation:      Moderate (conscious) sedation was administered by the endoscopy nurse       and supervised by the endoscopist. The following parameters were       monitored: oxygen saturation, heart rate, blood pressure, and response       to care. Recommendation:           - Return patient to hospital ward for ongoing care.                           - Soft diet.                           - Continue present medications.                           - No aspirin,  ibuprofen, naproxen, or other                            non-steroidal anti-inflammatory drugs for 2 weeks.                           - Resume Plavix (clopidogrel) at prior dose today. Procedure Code(s):        --- Professional ---                           [phone], Esophagogastroduodenoscopy, flexible,                            transoral; diagnostic, including collection of                            specimen(s) by brushing or washing, when performed                            (separate procedure) Diagnosis Code(s):        --- Professional ---                           K22.2, Esophageal obstruction                           K25.9, Gastric ulcer, unspecified as acute or                            chronic, without hemorrhage or perforation                           K29.80, Duodenitis without bleeding  K92.1, Melena (includes Hematochezia) CPT copyright 2016 American Medical Association. All rights reserved. The codes documented in this report are preliminary and upon coder review may  be revised to meet current compliance requirements. Otis Brace, MD Otis Brace, MD 02/23/2017 12:45:11 PM Number of Addenda: 0

## 2017-02-23 NOTE — Discharge Summary (Signed)
Physician Discharge Summary  [name] OZH:[id number] DOB: [birth year] DOA: 02/22/2017  PCP: Binnie Rail, MD  Admit date: 02/22/2017 Discharge date: 02/23/2017  Admitted From: Home Disposition: Home   Recommendations for Outpatient Follow-up:  1. Follow up with PCP in 1-2 weeks 2. Follow up with Dr. Wynelle Link in next week.  3. Check CBC at follow up.   Home Health: None new Equipment/Devices: None new Discharge Condition: Stable CODE STATUS: Full Diet recommendation: Heart healthy  Brief/Interim Summary: Ronald Stafford is a 83 y.o. male with a history of HTN, HLD, GERD not on PPI, colon polyps, prediabetes, perioperative DVT in 1997, TIA on plavix, and recent left THA exchange 12/5 by Dr. Wynelle Link who presented to the ED 12/13 for worsening dyspnea on exertion and lightheadedness. He was discharged after recent orthopedic procedure with aspirin added to plavix, denies taking any NSAIDs or EtOH. Since discharge he had been doing well until this morning when he noticed lightheaded feelings when arising from seated/laying posture and dyspnea only on exertion that has worsened to severe throughout today prompting ED visit. He had no palpitations, chest pain, orthopnea, leg swelling. On arrival he was in no distress, stable vital signs without hypoxia, mild sinus tachycardia (101bpm) on ECG without ischemic changes. Troponin negative. CXR was negative. Hgb down to 10.9 from 13.2 at recent discharge (which had trended down postoperatively from 15.9g/dl) with normal indices. Cr mildly up from previously at 1.02 with elevated BUN of 27. Platelets 224 from previously low baseline (120's). Protonix infusion started. Type and screen was sent, [facility] was consulted, and hospitalist asked to admit for symptomatic anemia. IV fluids were given with reduction of all cell lines, hemoglobin 10.3, and improvement in creatinine to 0.81. There has been no further bleeding/melena and symptoms have resolved. EGD on  12/14 showed no areas suspicious for current or recent bleeding. Recommendations are for PPI BID, restart plavix 12/15, and hold aspirin.   Discharge Diagnoses:  Principal Problem:   Symptomatic anemia Active Problems:   Hyperlipidemia   Essential hypertension   GERD   Acute blood loss anemia  Symptomatic acute blood loss anemia: Presumably combination of operative blood loss but more concerning possibility of upper GI bleeding with melena. No iron or vitamin deficiency on anemia panel.  - EGD showed 2 clean-based prepyloric ulcer. No evidence of active bleeding. Mild gastritis in the antrum.  Nonobstructive lower esophageal ring and mild duodenitis in the bulb. Biopsy was not performed as patient was on aspirin and Plavix. - Will continue protonix po BID x4 weeks, then once daily.  - Stop aspirin until follow up with orthopedics (was using as DVT ppx) - Restart plavix per GI recommendations on 12/15.  - Hgb has been stable and no further bleeding noted. Pt wants to go home and is reliable, so is discharged with return precautions.  - Recheck CBC at follow up.   s/p left hip THA revision:  - Pain control with tylenol and tramadol prn. Avoid NSAIDs.  HTN: Stable.  - VSS, continue home medications  Hyperlipidemia:  - Continue statin  Prediabetes: HbA1c 5.8%  GERD:  - PPI po BID as above  Discharge Instructions Discharge Instructions    Diet - low sodium heart healthy   Complete by:  As directed    Discharge instructions   Complete by:  As directed    You were admitted for lightheadedness and trouble breathing on exertion which has improved. The suspected cause is anemia. No evidence of bleeding  was noted on the EGD and your hemoglobin level has been stable with no further episodes of bleeding. You are stable for discharge with the following recommendations:  - Start taking protonix twice daily for 1 month, then once daily thereafter.  - Stop taking aspirin - Restart  plavix tomorrow - Follow up with Dr. Anne Fu office and your primary doctor in the next 1 - 2 weeks for repeat labs.  - If your symptoms return, you notice abnormal bleeding/bruising, or chest pain seek medical attention right away.   Increase activity slowly   Complete by:  As directed      Allergies as of 02/23/2017      Reactions   Chicken Allergy Nausea And Vomiting, Other (See Comments)   Digestive problems      Medication List    STOP taking these medications   aspirin 325 MG EC tablet   oxyCODONE 5 MG immediate release tablet Commonly known as:  Oxy IR/ROXICODONE     TAKE these medications   acetaminophen 325 MG tablet Commonly known as:  TYLENOL Take 650 mg by mouth every 6 (six) hours as needed for moderate pain or headache.   amLODipine 5 MG tablet Commonly known as:  NORVASC Take 1 tablet (5 mg total) by mouth daily.   atorvastatin 40 MG tablet Commonly known as:  LIPITOR Take 1 tablet (40 mg total) by mouth daily.   clopidogrel 75 MG tablet Commonly known as:  PLAVIX Take 1 tablet (75 mg total) by mouth daily.   gabapentin 100 MG capsule Commonly known as:  NEURONTIN Take 1 capsule (100 mg total) by mouth at bedtime. -- Office visit needed for further refills What changed:    when to take this  additional instructions   methocarbamol 500 MG tablet Commonly known as:  ROBAXIN Take 1 tablet (500 mg total) by mouth every 6 (six) hours as needed for muscle spasms.   pantoprazole 40 MG tablet Commonly known as:  PROTONIX Take 1 tablet (40 mg total) by mouth 2 (two) times daily.   traMADol 50 MG tablet Commonly known as:  ULTRAM Take 1-2 tablets (50-100 mg total) by mouth every 6 (six) hours as needed for moderate pain.   vitamin C 250 MG tablet Commonly known as:  ASCORBIC ACID Take 250 mg by mouth daily.      Follow-up Information    Binnie Rail, MD Follow up.   Specialty:  Internal Medicine Contact information: [place] [address] [phone]        Gaynelle Arabian, MD. Schedule an appointment as soon as possible for a visit in 1 week(s).   Specialty:  Orthopedic Surgery Contact information: [address] [place] [address] [phone]        Laurence Spates, MD. Schedule an appointment as soon as possible for a visit in 1 month(s).   Specialty:  Gastroenterology Contact information: [address] [place] [address] [phone]          Allergies  Allergen Reactions  . Chicken Allergy Nausea And Vomiting and Other (See Comments)    Digestive problems    Consultations:  [facility], Dr. Alessandra Bevels.  Procedures/Studies: Dg Chest 2 View  Result Date: 02/22/2017 CLINICAL DATA:  Shortness of breath since hip replacement on 02/14/2017 EXAM: CHEST  2 VIEW COMPARISON:  07/30/2016 FINDINGS: Heart size is normal. There is aortic atherosclerosis. There is chronic pulmonary scarring at the left base. No sign of active  infiltrate, mass, effusion or collapse. Ordinary degenerative changes affect the spine. Old healed rib fractures on the left. IMPRESSION: No active disease. Aortic atherosclerosis. Chronic left base scarring. Electronically Signed   By: Nelson Chimes M.D.   On: 02/22/2017 17:26   Dg Pelvis Portable  Result Date: 02/14/2017 CLINICAL DATA:  Anterior approach left hip arthroplasty. EXAM: PORTABLE PELVIS 1-2 VIEWS COMPARISON:  10/01/2012 lumbar radiographs which portions of the left hip. FINDINGS: There is an uncemented left hip arthroplasty without immediate postoperative complications. A single surgical drain projects over the left hip. Postop soft tissue emphysema is seen adjacent to the left femoral neck component. IMPRESSION: Left uncemented hip arthroplasty without immediate postop complications. Electronically Signed   By: Ashley Royalty M.D.   On: 02/14/2017 13:57   EGD 02/23/2017: - Non-obstructing Schatzki ring. - Non-bleeding  gastric ulcers with no stigmata of bleeding. Biopsy is contraindicated. - Duodenitis. - Normal first portion of the duodenum and second portion of the duodenum. - No specimens collected.  Subjective: No bleeding, had 1 regular bowel movement. When getting up has zero symptoms. No lightheadedness, dyspnea, chest pain, palpitations. Tolerated procedure well and wants to go home.   Discharge Exam: Vitals:   02/23/17 1305 02/23/17 1327  BP: (!) 131/49 (!) 123/57  Pulse: 94 84  Resp: 20 20  Temp:  98.4 F (36.9 C)  SpO2: 98% 99%   General: Pt is alert, awake, not in acute distress Cardiovascular: RRR, S1/S2 +, no rubs, no gallops Respiratory: CTA bilaterally, no wheezing, no rhonchi Abdominal: Soft, NT, ND, bowel sounds + Extremities: No edema, no cyanosis  Labs: Basic Metabolic Panel: Recent Labs  Lab 02/22/17 1606 02/23/17 0430  NA 137 140  K 3.8 3.9  CL 106 107  CO2 26 28  GLUCOSE 106* 100*  BUN 27* 18  CREATININE 1.02 0.81  CALCIUM 8.7* 8.5*   Liver Function Tests: Recent Labs  Lab 02/22/17 1606 02/23/17 0430  AST 28 23  ALT 17 16*  ALKPHOS 58 55  BILITOT 1.4* 1.5*  PROT 6.9 6.2*  ALBUMIN 3.8 3.4*   CBC: Recent Labs  Lab 02/22/17 1606 02/22/17 2054 02/23/17 0430  WBC 6.7 7.2 5.6  NEUTROABS 4.5  --   --   HGB 10.9* 10.4* 10.3*  HCT 34.0* 32.2* 31.5*  MCV 96.0 96.7 96.9  PLT 224 197 172   Cardiac Enzymes: Recent Labs  Lab 02/22/17 1606  TROPONINI <0.03   Anemia work up Recent Labs    02/22/17 1606 02/22/17 1854  VITAMINB12  --  390  FOLATE  --  21.4  FERRITIN  --  78  TIBC  --  297  IRON  --  40*  RETICCTPCT 2.9  --     Time coordinating discharge: Approximately 40 minutes  Vance Gather, MD  [facility] 02/23/2017, 1:56 PM Pager [phone]

## 2017-02-23 NOTE — Consult Note (Signed)
Referring Provider:  Dr. Wilson Singer ED Primary Care Physician:  Binnie Rail, MD Primary Gastroenterologist:  Dr. Oletta Lamas  Reason for Consultation:  Melena/upper GI bleed  HPI: Ronald Stafford is a 83 y.o. male who recently underwent left hip revision on 02/14/2017. Patient with history of TIA and was on Plavix. Patient was subsequently started on full dose aspirin along with Plavix for DVT prophylaxis. Patient presented to emergency room with the symptoms of dizziness and shortness of breath. Patient was complaining of black color stool. GI is consulted for further evaluation.  Patient seen and examined at bedside. Patient started noticing black color stool yesterday. Multiple bowel movements so far last one was in last one hour. He is also noted sharp pain between epigastric and umbilical area. Denied any radiation of pain to the back. Denied any nausea or vomiting. Denied any bright blood per rectum. Last dose of aspirin and Plavix was yesterday. Denied any NSAID use.   Patient underwent EGD and colonoscopy in August 2016 ( Dr. Oletta Lamas) for evaluation of iron deficiency anemia. EGD was normal. Colonoscopy showed small descending colon polyp and internal hemorrhoids.  there was no evidence of bleeding.   Past Medical History:  Diagnosis Date  . Cancer [facility]) 2004   prostate cancer, Dr. Risa Grill  . Colon polyps   . Deep venous thrombosis ([place]) 1997   Postop  . Depression   . GERD (gastroesophageal reflux disease)   . Hyperlipidemia   . Hypertension   . Shoulder fracture, left 02/23/2011   immobilized in sling , Dr Norris(no surgery)    Past Surgical History:  Procedure Laterality Date  . COLONOSCOPY  2006   Negative,Dr.Edwards  . colonoscopy with polypectomy      PMH of  . JOINT REPLACEMENT  1997, 2001   Total Hip replacement X 2  . LUMBAR SPINE SURGERY  09/28/2010   Dr Rolena Infante; for spinal stenosis  . MECKEL DIVERTICULUM EXCISION     Age 10  . PROSTATECTOMY  2004   w/ radiation, Dr  Risa Grill  . TOTAL HIP REVISION Left 02/14/2017   Procedure: Left hip polyethylene EXCHANGE;  Surgeon: Gaynelle Arabian, MD;  Location: [facility];  Service: Orthopedics;  Laterality: Left;  [name] VASECTOMY      Prior to Admission medications   Medication Sig Start Date End Date Taking? Authorizing Provider  acetaminophen (TYLENOL) 325 MG tablet Take 650 mg by mouth every 6 (six) hours as needed for moderate pain or headache.    Yes [provider]  amLODipine (NORVASC) 5 MG tablet Take 1 tablet (5 mg total) by mouth daily. 06/27/16  Yes Burns, Claudina Lick, MD  aspirin EC 325 MG EC tablet Take 1 tablet (325 mg total) by mouth daily with breakfast. Take a full dose 325 mg enteric coated aspirin daily for three weeks, then reduce to a baby 81 mg Aspirin daily for three additional weeks, then discontinue the aspirin. 02/15/17  Yes Perkins, Alexzandrew L, PA-C  atorvastatin (LIPITOR) 40 MG tablet Take 1 tablet (40 mg total) by mouth daily. 06/27/16  Yes Burns, Claudina Lick, MD  gabapentin (NEURONTIN) 100 MG capsule Take 1 capsule (100 mg total) by mouth at bedtime. -- Office visit needed for further refills Patient taking differently: Take 100 mg by mouth daily.  11/09/16  Yes Burns, Claudina Lick, MD  methocarbamol (ROBAXIN) 500 MG tablet Take 1 tablet (500 mg total) by mouth every 6 (six) hours as needed for muscle spasms. 02/15/17  Yes Perkins, Alexzandrew L, PA-C  traMADol (ULTRAM) 50 MG tablet Take 1-2 tablets (50-100 mg total) by mouth every 6 (six) hours as needed for moderate pain. 02/15/17  Yes Perkins, Alexzandrew L, PA-C  vitamin C (ASCORBIC ACID) 250 MG tablet Take 250 mg by mouth daily.   Yes [provider]  clopidogrel (PLAVIX) 75 MG tablet Take 1 tablet (75 mg total) by mouth daily. 06/27/16   Binnie Rail, MD  oxyCODONE (OXY IR/ROXICODONE) 5 MG immediate release tablet Take 1-2 tablets (5-10 mg total) by mouth every 4 (four) hours as needed for moderate pain or severe pain. 02/15/17   Perkins,  Alexzandrew L, PA-C    Scheduled Meds: . amLODipine  5 mg Oral Daily  . atorvastatin  40 mg Oral Daily  . [START ON 02/26/2017] pantoprazole  40 mg Intravenous Q12H  . sodium chloride flush  3 mL Intravenous Q12H   Continuous Infusions: . sodium chloride 100 mL/hr at 02/23/17 0631  . pantoprozole (PROTONIX) infusion 8 mg/hr (02/22/17 2214)   PRN Meds:.acetaminophen, gabapentin, methocarbamol, ondansetron **OR** ondansetron (ZOFRAN) IV, senna-docusate, traMADol  Allergies as of 02/22/2017 - Review Complete 02/22/2017  Allergen Reaction Noted  . Chicken allergy Nausea And Vomiting and Other (See Comments) 08/05/2012    Family History  Problem Relation Age of Onset  . Prostate cancer Father   . Testicular cancer Son     Social History   Socioeconomic History  . Marital status: Widowed    Spouse name: Not on file  . Number of children: Not on file  . Years of education: Not on file  . Highest education level: Not on file  Social Needs  . Financial resource strain: Not on file  . Food insecurity - worry: Not on file  . Food insecurity - inability: Not on file  . Transportation needs - medical: Not on file  . Transportation needs - non-medical: Not on file  Occupational History  . Not on file  Tobacco Use  . Smoking status: Former Smoker    Packs/day: 0.50    Years: 5.00    Pack years: 2.50    Types: Cigarettes    Last attempt to quit: 03/13/1988    Years since quitting: 28.9  . Smokeless tobacco: Former Systems developer  . Tobacco comment: Quit at about age 41 / chewed tobacca x 1 year  Substance and Sexual Activity  . Alcohol use: No    Alcohol/week: 0.0 oz  . Drug use: No  . Sexual activity: No  Other Topics Concern  . Not on file  Social History Narrative   Lives alone; Wife passed 05-Dec-2012;   Died of ovarian cancer; breast cancer; other cancer;       Review of Systems: Review of Systems  Constitutional: Positive for malaise/fatigue. Negative for chills and  fever.  HENT: Negative for hearing loss and tinnitus.   Eyes: Negative for blurred vision and double vision.  Respiratory: Positive for shortness of breath. Negative for cough and hemoptysis.   Cardiovascular: Negative for chest pain and palpitations.  Gastrointestinal: Positive for abdominal pain and melena. Negative for constipation, diarrhea, heartburn, nausea and vomiting.  Genitourinary: Negative for dysuria and urgency.  Musculoskeletal: Negative for myalgias and neck pain.  Skin: Negative for itching and rash.  Neurological: Positive for weakness. Negative for focal weakness and seizures.  Endo/Heme/Allergies: Does not bruise/bleed easily.  Psychiatric/Behavioral: Negative for hallucinations and suicidal ideas.    Physical Exam: Vital signs: Vitals:   02/22/17 2335 02/23/17 0401  BP: (!) 113/43 [name])  126/57  Pulse:  83  Resp:  16  Temp:  98.2 F (36.8 C)  SpO2:  95%   Last BM Date: 02/22/17 Physical Exam  Constitutional: He is oriented to person, place, and time. He appears well-developed and well-nourished. No distress.  HENT:  Head: Normocephalic and atraumatic.  Mouth/Throat: Oropharynx is clear and moist. No oropharyngeal exudate.  Eyes: EOM are normal. No scleral icterus.  Neck: Normal range of motion. Neck supple. No thyromegaly present.  Cardiovascular: Normal rate and regular rhythm.  Murmur heard. Pulmonary/Chest: Effort normal and breath sounds normal. No respiratory distress.  Abdominal: Soft. Bowel sounds are normal. He exhibits no distension. There is no rebound and no guarding.  Mild discomfort between epigastric/ just above the periumbilical area. No peritoneal signs  Musculoskeletal: Normal range of motion. He exhibits no edema.  Neurological: He is alert and oriented to person, place, and time.  Skin: Skin is warm. No erythema.  Psychiatric: He has a normal mood and affect. Judgment and thought content normal.  Vitals reviewed.   GI:  Lab  Results: Recent Labs    02/22/17 1606 02/22/17 2054 02/23/17 0430  WBC 6.7 7.2 5.6  HGB 10.9* 10.4* 10.3*  HCT 34.0* 32.2* 31.5*  PLT 224 197 172   BMET Recent Labs    02/22/17 1606 02/23/17 0430  NA 137 140  K 3.8 3.9  CL 106 107  CO2 26 28  GLUCOSE 106* 100*  BUN 27* 18  CREATININE 1.02 0.81  CALCIUM 8.7* 8.5*   LFT Recent Labs    02/23/17 0430  PROT 6.2*  ALBUMIN 3.4*  AST 23  ALT 16*  ALKPHOS 55  BILITOT 1.5*   PT/INR Recent Labs    02/23/17 0430  LABPROT 14.0  INR 1.09     Studies/Results: Dg Chest 2 View  Result Date: 02/22/2017 CLINICAL DATA:  Shortness of breath since hip replacement on 02/14/2017 EXAM: CHEST  2 VIEW COMPARISON:  07/30/2016 FINDINGS: Heart size is normal. There is aortic atherosclerosis. There is chronic pulmonary scarring at the left base. No sign of active infiltrate, mass, effusion or collapse. Ordinary degenerative changes affect the spine. Old healed rib fractures on the left. IMPRESSION: No active disease. Aortic atherosclerosis. Chronic left base scarring. Electronically Signed   By: Nelson Chimes M.D.   On: 02/22/2017 17:26    Impression/Plan: - Melena in setting of Plavix and full dose aspirin. - Acute blood loss anemia - Recent left hip revision on 02/14/2017. Full dose aspirin was added on the Plavix for DVT prophylaxis - History of TIA. On Plavix. Last dose yesterday.  Recommendations ------------------------- - Patient had drop in hemoglobin from 13.2 to 10.3. Also had the elevated BUN. Last dose of aspirin and Plavix yesterday. - EGD today for further evaluation. Continue Protonix drip for now. Further plan based on endoscopy findings  Risks (bleeding, infection, bowel perforation that could require surgery, sedation-related changes in cardiopulmonary systems), benefits (identification and possible treatment of source of symptoms, exclusion of certain causes of symptoms), and alternatives (watchful waiting,  radiographic imaging studies, empiric medical treatment)  were explained to patient and family in detail and patient wishes to proceed.   LOS: 0 days   Otis Brace  MD, FACP 02/23/2017, 9:10 AM  Contact #  [phone]

## 2017-02-23 NOTE — Brief Op Note (Signed)
02/22/2017 - 02/23/2017  12:46 PM  PATIENT:  Ronald Stafford  83 y.o. male  PRE-OPERATIVE DIAGNOSIS:  Melena  POST-OPERATIVE DIAGNOSIS:  2 non bleeding gastric ulcers  PROCEDURE:  Procedure(s): ESOPHAGOGASTRODUODENOSCOPY (EGD) (N/A)  SURGEON:  Surgeon(s) and Role:    * Reda Citron, MD - Primary  Findings -------------- - EGD showed 2 clean-based prepyloric ulcer. No evidence of active bleeding. Mild gastritis in the antrum.  Nonobstructive lower esophageal ring and mild duodenitis in the bulb. Biopsy was not performed as patient was on aspirin and Plavix.  Recommendations -------------------------- - Change Protonix to IV twice a day. Continue IV twice a day Protonix while in the hospital. - Switch to oral twice a day PPI for next 4 weeks, followed by once a day PPI  - Avoid aspirin for next 2 weeks if possible. If patient needs long-term aspirin, recommend once a day PPI for life long. - Okay to resume Plavix from tomorrow if hemoglobin remains stable. - Soft diet, advance to cardiac as tolerated - GI will sign off. Call us back if needed  Otis Brace MD, [place] 02/23/2017, 12:48 PM  Contact #  [phone]

## 2017-02-23 NOTE — Progress Notes (Signed)
Pt was active with [facility] at home. Pt discharged today, in house rep with [facility] made aware. No orders needed.

## 2017-02-26 ENCOUNTER — Encounter (HOSPITAL_COMMUNITY): Payer: Self-pay | Admitting: Gastroenterology

## 2017-03-08 ENCOUNTER — Other Ambulatory Visit (INDEPENDENT_AMBULATORY_CARE_PROVIDER_SITE_OTHER): Payer: Medicare Other

## 2017-03-08 ENCOUNTER — Ambulatory Visit: Payer: Medicare Other | Admitting: Internal Medicine

## 2017-03-08 ENCOUNTER — Encounter: Payer: Self-pay | Admitting: Internal Medicine

## 2017-03-08 VITALS — BP 148/60 | HR 106 | Temp 97.8°F | Resp 18 | Wt 186.0 lb

## 2017-03-08 DIAGNOSIS — I10 Essential (primary) hypertension: Secondary | ICD-10-CM

## 2017-03-08 DIAGNOSIS — D649 Anemia, unspecified: Secondary | ICD-10-CM

## 2017-03-08 DIAGNOSIS — K219 Gastro-esophageal reflux disease without esophagitis: Secondary | ICD-10-CM | POA: Diagnosis not present

## 2017-03-08 LAB — CBC WITH DIFFERENTIAL/PLATELET
BASOS PCT: 1.3 % (ref 0.0–3.0)
Basophils Absolute: 0.1 10*3/uL (ref 0.0–0.1)
EOS ABS: 0.1 10*3/uL (ref 0.0–0.7)
EOS PCT: 1.9 % (ref 0.0–5.0)
HEMATOCRIT: 35.7 % — AB (ref 39.0–52.0)
HEMOGLOBIN: 11.7 g/dL — AB (ref 13.0–17.0)
LYMPHS PCT: 12.9 % (ref 12.0–46.0)
Lymphs Abs: 0.7 10*3/uL (ref 0.7–4.0)
MCHC: 32.8 g/dL (ref 30.0–36.0)
MCV: 96.1 fl (ref 78.0–100.0)
MONOS PCT: 16 % — AB (ref 3.0–12.0)
Monocytes Absolute: 0.9 10*3/uL (ref 0.1–1.0)
Neutro Abs: 3.8 10*3/uL (ref 1.4–7.7)
Neutrophils Relative %: 67.9 % (ref 43.0–77.0)
Platelets: 195 10*3/uL (ref 150.0–400.0)
RBC: 3.71 Mil/uL — ABNORMAL LOW (ref 4.22–5.81)
RDW: 14.7 % (ref 11.5–15.5)
WBC: 5.7 10*3/uL (ref 4.0–10.5)

## 2017-03-08 MED ORDER — GABAPENTIN 100 MG PO CAPS: 100.0000 mg | ORAL_CAPSULE | Freq: Every day | ORAL | 1 refills | Status: DC

## 2017-03-08 NOTE — Patient Instructions (Addendum)
  Test(s) ordered today. Your results will be released to [place] (or called to you) after review, usually within 72hours after test completion. If any changes need to be made, you will be notified at that same time.   Medications reviewed and updated.  No changes recommended at this time.   Follow up in April as scheduled.

## 2017-03-08 NOTE — Progress Notes (Signed)
Subjective:    Patient ID: Ronald Stafford, male    DOB: 01/27/1931, 83 y.o.   MRN: [id number]  HPI The patient is here for follow up from the hospital.  Admitted 02/14/17-02/15/17 due to having a failed total hip arthroplasty.  He was experiencing worsening pain.  He was admitted for left hip polyethylene exchange.  He had an uneventful postoperative course.  He did receive postop antibiotics, DVT prophylaxis with Plavix and aspirin 325 mg, physical therapy and occupational therapy.  He was discharged home.  Admitted 02/22/17-02/23/17 for dyspnea.  He was experiencing worsening dyspnea on exertion and lightheadedness.  His symptoms started that morning, but progressively worsened.  He denied any palpitations, chest pain, orthopnea or leg swelling.  His vital signs were stable without hypoxia.  He had mild sinus tachycardia at 101.  There were no EKG changes.  Troponins were negative.  Chest x-ray was negative.  His hemoglobin had decreased from 13.2-10.9.  Creatinine was mildly elevated at 1.02.  He received a Protonix infusion.  GI was consulted and he was admitted for symptomatic anemia.  He received IV fluids and his hemoglobin remained stable.  EGD on 12/14 showed no areas suspicious for current or recent bleeding.  It was recommended that he stay on a PPI twice daily, restart Plavix 12/15 and hold the aspirin.    Symptomatic anemia The anemia was thought to be a combination of operative blood loss possible upper GI bleed with melena.  EGD showed 2 clean-based prepyloric ulcers.  There is no evidence of active bleeding.  He had mild gastritis in the antrum.  And mild duodenitis. PPI twice daily times 4 weeks then once daily His aspirin was stopped until he follows up with orthopedics Continue Plavix, restarted 12/15 He denies nausea, GERD, abdominal pain, blood in the stool, black stool, diarrhea.  He has experienced some mild constipation from the pain medications and is taking a stool softener.   He denies lightheadedness and shortness of breath. Recheck CBC today  s/p left hip THA revision Pain control with Tylenol.  He is no longer taking the tramadol He has completed physical therapy No NSAIDs-he understands he cannot take any NSAIDs  Hyperlipidemia, hypertension, prediabetes All stable Continued on home medications Blood pressure slightly elevated here today, but was previously well controlled over the last few measurements.  He is taking his medication daily.  Medications and allergies reviewed with patient and updated if appropriate.  Patient Active Problem List   Diagnosis Date Noted  . Symptomatic anemia 02/22/2017  . Acute blood loss anemia 02/22/2017  . Failed total hip arthroplasty ([place]) 02/14/2017  . Pre-op examination 12/11/2016  . Osteoarthritis of left hip 12/10/2016  . Acute right ankle pain 11/07/2016  . Acute pain of left shoulder 10/04/2016  . Chest pain 07/30/2016  . History of DVT (deep vein thrombosis) 07/30/2016  . TIA (transient ischemic attack) 07/30/2016  . Thrombocytopenia ([place]) 07/30/2016  . Carotid arterial disease ([place]) 07/20/2016  . Right sided weakness 06/27/2016  . Blurry vision 06/27/2016  . Slurred speech 06/27/2016  . Cough 01/26/2016  . Left wrist pain 08/11/2015  . Frequent headaches 06/22/2015  . Prediabetes 02/11/2015  . Episodic paroxysmal hemicrania, not intractable 02/11/2015  . Occult blood positive stool 09/27/2014  . CAP (community acquired pneumonia) 09/25/2014  . Peroneal tendonitis of right lower extremity 01/12/2014  . Mass of right ankle 12/31/2013  . Right ankle instability 12/31/2013  . Radial head fracture, closed 09/18/2013  . Lateral  epicondylitis of right elbow 09/16/2013  . Solitary pulmonary nodule 08/08/2012  . SPINAL STENOSIS, LUMBAR 04/06/2010  . LOW BACK PAIN SYNDROME 11/02/2009  . FOOT DROP, RIGHT 05/21/2008  . Other nonthrombocytopenic purpuras 10/21/2007  . Hyperlipidemia 12/25/2006  .  Depression 12/25/2006  . Essential hypertension 12/25/2006  . GERD 12/25/2006  . PROSTATE CANCER, HX OF 12/25/2006  . POLYP, COLON 05/02/2006    Current Outpatient Medications on File Prior to Visit  Medication Sig Dispense Refill  . acetaminophen (TYLENOL) 325 MG tablet Take 650 mg by mouth every 6 (six) hours as needed for moderate pain or headache.     [name] amLODipine (NORVASC) 5 MG tablet Take 1 tablet (5 mg total) by mouth daily. 90 tablet 3  . atorvastatin (LIPITOR) 40 MG tablet Take 1 tablet (40 mg total) by mouth daily. 90 tablet 3  . clopidogrel (PLAVIX) 75 MG tablet Take 1 tablet (75 mg total) by mouth daily. 90 tablet 3  . methocarbamol (ROBAXIN) 500 MG tablet Take 1 tablet (500 mg total) by mouth every 6 (six) hours as needed for muscle spasms. 60 tablet 0  . pantoprazole (PROTONIX) 40 MG tablet Take 1 tablet (40 mg total) by mouth 2 (two) times daily. 60 tablet 0  . traMADol (ULTRAM) 50 MG tablet Take 1-2 tablets (50-100 mg total) by mouth every 6 (six) hours as needed for moderate pain. 56 tablet 0  . vitamin C (ASCORBIC ACID) 250 MG tablet Take 250 mg by mouth daily.     No current facility-administered medications on file prior to visit.     Past Medical History:  Diagnosis Date  . Cancer [facility]) 2004   prostate cancer, Dr. Risa Grill  . Colon polyps   . Deep venous thrombosis ([place]) 1997   Postop  . Depression   . GERD (gastroesophageal reflux disease)   . Hyperlipidemia   . Hypertension   . Shoulder fracture, left 02/23/2011   immobilized in sling , Dr Norris(no surgery)    Past Surgical History:  Procedure Laterality Date  . COLONOSCOPY  2006   Negative,Dr.Edwards  . colonoscopy with polypectomy      PMH of  . ESOPHAGOGASTRODUODENOSCOPY N/A 02/23/2017   Procedure: ESOPHAGOGASTRODUODENOSCOPY (EGD);  Surgeon: Otis Brace, MD;  Location: [facility];  Service: Gastroenterology;  Laterality: N/A;  . JOINT REPLACEMENT  1997, 2001   Total Hip replacement X 2  .  LUMBAR SPINE SURGERY  09/28/2010   Dr Rolena Infante; for spinal stenosis  . MECKEL DIVERTICULUM EXCISION     Age 26  . PROSTATECTOMY  2004   w/ radiation, Dr Risa Grill  . TOTAL HIP REVISION Left 02/14/2017   Procedure: Left hip polyethylene EXCHANGE;  Surgeon: Gaynelle Arabian, MD;  Location: [facility];  Service: Orthopedics;  Laterality: Left;  [name] VASECTOMY      Social History   Socioeconomic History  . Marital status: Widowed    Spouse name: None  . Number of children: None  . Years of education: None  . Highest education level: None  Social Needs  . Financial resource strain: None  . Food insecurity - worry: None  . Food insecurity - inability: None  . Transportation needs - medical: None  . Transportation needs - non-medical: None  Occupational History  . None  Tobacco Use  . Smoking status: Former Smoker    Packs/day: 0.50    Years: 5.00    Pack years: 2.50    Types: Cigarettes    Last attempt to quit: 03/13/1988  Years since quitting: 29.0  . Smokeless tobacco: Former Systems developer  . Tobacco comment: Quit at about age 61 / chewed tobacca x 1 year  Substance and Sexual Activity  . Alcohol use: No    Alcohol/week: 0.0 oz  . Drug use: No  . Sexual activity: No  Other Topics Concern  . None  Social History Narrative   Lives alone; Wife passed 12-24-2012;   Died of ovarian cancer; breast cancer; other cancer;       Family History  Problem Relation Age of Onset  . Prostate cancer Father   . Testicular cancer Son     Review of Systems  Constitutional: Negative for appetite change, chills and fever.  Respiratory: Negative for shortness of breath.   Cardiovascular: Negative for chest pain.  Gastrointestinal: Negative for abdominal pain, blood in stool (no black stool), constipation, diarrhea and nausea.       No gerd  Neurological: Negative for light-headedness and headaches.       Objective:   Vitals:   03/08/17 1011  BP: (!) 148/60  Pulse: (!) 106  Resp: 18  Temp:  97.8 F (36.6 C)  SpO2: 97%   Wt Readings from Last 3 Encounters:  03/08/17 186 lb (84.4 kg)  02/23/17 182 lb (82.6 kg)  02/14/17 187 lb (84.8 kg)   Body mass index is 23.88 kg/m.   Physical Exam    Constitutional: Appears well-developed and well-nourished. No distress.  HENT:  Head: Normocephalic and atraumatic.  Neck: Neck supple. No tracheal deviation present. No thyromegaly present.  No cervical lymphadenopathy Cardiovascular: Normal rate, regular rhythm and normal heart sounds.   No murmur heard. No carotid bruit .  No edema Pulmonary/Chest: Effort normal and breath sounds normal. No respiratory distress. No has no wheezes. No rales.  Abdomen: Soft, nontender, nondistended Skin: Skin is warm and dry. Not diaphoretic.  Psychiatric: Normal mood and affect. Behavior is normal.      Assessment & Plan:    See Problem List for Assessment and Plan of chronic medical problems.

## 2017-03-08 NOTE — Assessment & Plan Note (Signed)
Symptomatic anemia requiring hospitalization-related to operative blood loss and dual anticoagulation Full aspirin discontinued, continued on Plavix Did not require transfusion No current symptoms of anemia or bleeding Check CBC today Continue PPI twice daily for 1 full month and then decrease to once daily

## 2017-03-08 NOTE — Assessment & Plan Note (Signed)
Blood pressure slightly elevated, but previous measurements well controlled Continue current medication

## 2017-03-08 NOTE — Assessment & Plan Note (Signed)
No symptoms suggestive of GERD EGD did show 2 ulcers in stomach-no active bleeding Drop in hemoglobin likely related to upper GI bleed secondary to being on Plavix and full aspirin Aspirin discontinued, no further NSAIDs Okay to continue Plavix Check CBC Continue Protonix twice daily for 1 full month then decrease to once daily

## 2017-03-28 DIAGNOSIS — Z96649 Presence of unspecified artificial hip joint: Secondary | ICD-10-CM | POA: Insufficient documentation

## 2017-06-13 ENCOUNTER — Other Ambulatory Visit (INDEPENDENT_AMBULATORY_CARE_PROVIDER_SITE_OTHER): Payer: Medicare Other

## 2017-06-13 ENCOUNTER — Encounter: Payer: Self-pay | Admitting: Internal Medicine

## 2017-06-13 ENCOUNTER — Ambulatory Visit: Payer: Medicare Other | Admitting: Internal Medicine

## 2017-06-13 VITALS — BP 142/82 | HR 95 | Temp 97.8°F | Resp 16 | Wt 188.0 lb

## 2017-06-13 DIAGNOSIS — E785 Hyperlipidemia, unspecified: Secondary | ICD-10-CM

## 2017-06-13 DIAGNOSIS — K219 Gastro-esophageal reflux disease without esophagitis: Secondary | ICD-10-CM

## 2017-06-13 DIAGNOSIS — I10 Essential (primary) hypertension: Secondary | ICD-10-CM

## 2017-06-13 DIAGNOSIS — R7303 Prediabetes: Secondary | ICD-10-CM | POA: Diagnosis not present

## 2017-06-13 DIAGNOSIS — L989 Disorder of the skin and subcutaneous tissue, unspecified: Secondary | ICD-10-CM

## 2017-06-13 LAB — CBC WITH DIFFERENTIAL/PLATELET
Basophils Absolute: 0.1 10*3/uL (ref 0.0–0.1)
Basophils Relative: 2.2 % (ref 0.0–3.0)
EOS PCT: 0.8 % (ref 0.0–5.0)
Eosinophils Absolute: 0 10*3/uL (ref 0.0–0.7)
HCT: 41.6 % (ref 39.0–52.0)
HEMOGLOBIN: 13.8 g/dL (ref 13.0–17.0)
Lymphocytes Relative: 21.6 % (ref 12.0–46.0)
Lymphs Abs: 1.1 10*3/uL (ref 0.7–4.0)
MCHC: 33.1 g/dL (ref 30.0–36.0)
MCV: 90.6 fl (ref 78.0–100.0)
MONO ABS: 0.7 10*3/uL (ref 0.1–1.0)
MONOS PCT: 13.7 % — AB (ref 3.0–12.0)
Neutro Abs: 3 10*3/uL (ref 1.4–7.7)
Neutrophils Relative %: 61.7 % (ref 43.0–77.0)
Platelets: 119 10*3/uL — ABNORMAL LOW (ref 150.0–400.0)
RBC: 4.59 Mil/uL (ref 4.22–5.81)
RDW: 15.6 % — ABNORMAL HIGH (ref 11.5–15.5)
WBC: 4.9 10*3/uL (ref 4.0–10.5)

## 2017-06-13 LAB — LIPID PANEL
CHOLESTEROL: 112 mg/dL (ref 0–200)
HDL: 43.6 mg/dL (ref >39.00)
LDL CALC: 56 mg/dL (ref 0–99)
NonHDL: 68.04
TRIGLYCERIDES: 61 mg/dL (ref 0.0–149.0)
Total CHOL/HDL Ratio: 3
VLDL: 12.2 mg/dL (ref 0.0–40.0)

## 2017-06-13 LAB — COMPREHENSIVE METABOLIC PANEL
ALBUMIN: 4.3 g/dL (ref 3.5–5.2)
ALK PHOS: 65 U/L (ref 39–117)
ALT: 21 U/L (ref 0–53)
AST: 25 U/L (ref 0–37)
BUN: 17 mg/dL (ref 6–23)
CALCIUM: 9.5 mg/dL (ref 8.4–10.5)
CO2: 30 mEq/L (ref 19–32)
Chloride: 106 mEq/L (ref 96–112)
Creatinine, Ser: 0.77 mg/dL (ref 0.40–1.50)
GFR: 102.16 mL/min (ref >60.00)
Glucose, Bld: 94 mg/dL (ref 70–99)
POTASSIUM: 4 meq/L (ref 3.5–5.1)
Sodium: 143 mEq/L (ref 135–145)
TOTAL PROTEIN: 7.2 g/dL (ref 6.0–8.3)
Total Bilirubin: 0.8 mg/dL (ref 0.2–1.2)

## 2017-06-13 LAB — HEMOGLOBIN A1C: Hgb A1c MFr Bld: 6.1 % (ref 4.6–6.5)

## 2017-06-13 NOTE — Assessment & Plan Note (Signed)
Check a1c Low sugar / carb diet Stressed regular exercise   

## 2017-06-13 NOTE — Assessment & Plan Note (Signed)
BP controlled Current regimen effective and well tolerated Continue current medications at current doses cmp 

## 2017-06-13 NOTE — Assessment & Plan Note (Signed)
Check lipid panel  Continue daily statin Regular exercise and healthy diet encouraged  

## 2017-06-13 NOTE — Assessment & Plan Note (Signed)
Top of ears and right temple area Will refer to derm

## 2017-06-13 NOTE — Assessment & Plan Note (Signed)
GERD controlled Continue daily medication  

## 2017-06-13 NOTE — Patient Instructions (Addendum)
  Test(s) ordered today. Your results will be released to MyChart (or called to you) after review, usually within 72hours after test completion. If any changes need to be made, you will be notified at that same time.  Medications reviewed and updated.  No changes recommended at this time.    Please followup in 6 months   

## 2017-06-13 NOTE — Progress Notes (Signed)
Subjective:    Patient ID: Ronald Stafford, male    DOB: [birth year], 84 y.o.   MRN: [id number]  HPI The patient is here for follow up.  Hypertension: He is taking his medication daily. He is compliant with a low sodium diet.  He denies chest pain, palpitations, edema, shortness of breath and regular headaches. He is doing yardwork, not exercising regularly.  He does not monitor his blood pressure at home.    GERD:  He is taking his medication daily as prescribed.  He denies any GERD symptoms and feels his GERD is well controlled.   Hyperlipidemia: He is taking his medication daily. He is compliant with a low fat/cholesterol diet. He is doing yardwork, not exercising regularly. He denies myalgias.   Prediabetes:  He is compliant with a low sugar/carbohydrate diet.  He is doing yardwork, not exercising regularly.  Monday night he had diarrhea and nausea/vomiting.  He feels ok now.  He is unsure what caused his symptoms. He did not have fever/chills, abdominal pain, or blood in the stool or vomit.    Spot a face that is dark and his ear on top itch and burn.  He has never seen a derm.    Medications and allergies reviewed with patient and updated if appropriate.  Patient Active Problem List   Diagnosis Date Noted  . Skin abnormalities 06/13/2017  . Symptomatic anemia 02/22/2017  . Acute blood loss anemia 02/22/2017  . Failed total hip arthroplasty ([place]) 02/14/2017  . Pre-op examination 12/11/2016  . Osteoarthritis of left hip 12/10/2016  . Acute right ankle pain 11/07/2016  . Acute pain of left shoulder 10/04/2016  . Chest pain 07/30/2016  . History of DVT (deep vein thrombosis) 07/30/2016  . TIA (transient ischemic attack) 07/30/2016  . Thrombocytopenia ([place]) 07/30/2016  . Carotid arterial disease ([place]) 07/20/2016  . Right sided weakness 06/27/2016  . Blurry vision 06/27/2016  . Slurred speech 06/27/2016  . Cough 01/26/2016  . Left wrist pain 08/11/2015  . Frequent headaches  06/22/2015  . Prediabetes 02/11/2015  . Episodic paroxysmal hemicrania, not intractable 02/11/2015  . Peroneal tendonitis of right lower extremity 01/12/2014  . Mass of right ankle 12/31/2013  . Right ankle instability 12/31/2013  . Radial head fracture, closed 09/18/2013  . Lateral epicondylitis of right elbow 09/16/2013  . Solitary pulmonary nodule 08/08/2012  . SPINAL STENOSIS, LUMBAR 04/06/2010  . LOW BACK PAIN SYNDROME 11/02/2009  . FOOT DROP, RIGHT 05/21/2008  . Other nonthrombocytopenic purpuras 10/21/2007  . Hyperlipidemia 12/25/2006  . Essential hypertension 12/25/2006  . GERD 12/25/2006  . PROSTATE CANCER, HX OF 12/25/2006  . POLYP, COLON 05/02/2006    Current Outpatient Medications on File Prior to Visit  Medication Sig Dispense Refill  . acetaminophen (TYLENOL) 325 MG tablet Take 650 mg by mouth every 6 (six) hours as needed for moderate pain or headache.     [name] amLODipine (NORVASC) 5 MG tablet Take 1 tablet (5 mg total) by mouth daily. 90 tablet 3  . atorvastatin (LIPITOR) 40 MG tablet Take 1 tablet (40 mg total) by mouth daily. 90 tablet 3  . clopidogrel (PLAVIX) 75 MG tablet Take 1 tablet (75 mg total) by mouth daily. 90 tablet 3  . gabapentin (NEURONTIN) 100 MG capsule Take 1 capsule (100 mg total) by mouth at bedtime. 90 capsule 1  . methocarbamol (ROBAXIN) 500 MG tablet Take 1 tablet (500 mg total) by mouth every 6 (six) hours as needed for muscle spasms. 60 tablet  0  . vitamin C (ASCORBIC ACID) 250 MG tablet Take 250 mg by mouth daily.    . pantoprazole (PROTONIX) 40 MG tablet Take 1 tablet (40 mg total) by mouth 2 (two) times daily. 60 tablet 0   No current facility-administered medications on file prior to visit.     Past Medical History:  Diagnosis Date  . Cancer [facility]) 2004   prostate cancer, Dr. Risa Grill  . Colon polyps   . Deep venous thrombosis ([place]) 1997   Postop  . Depression   . GERD (gastroesophageal reflux disease)   . Hyperlipidemia   .  Hypertension   . Shoulder fracture, left 02/23/2011   immobilized in sling , Dr Norris(no surgery)    Past Surgical History:  Procedure Laterality Date  . COLONOSCOPY  2006   Negative,Dr.Edwards  . colonoscopy with polypectomy      PMH of  . ESOPHAGOGASTRODUODENOSCOPY N/A 02/23/2017   Procedure: ESOPHAGOGASTRODUODENOSCOPY (EGD);  Surgeon: Otis Brace, MD;  Location: [facility];  Service: Gastroenterology;  Laterality: N/A;  . JOINT REPLACEMENT  1997, 2001   Total Hip replacement X 2  . LUMBAR SPINE SURGERY  09/28/2010   Dr Rolena Infante; for spinal stenosis  . MECKEL DIVERTICULUM EXCISION     Age 26  . PROSTATECTOMY  2004   w/ radiation, Dr Risa Grill  . TOTAL HIP REVISION Left 02/14/2017   Procedure: Left hip polyethylene EXCHANGE;  Surgeon: Gaynelle Arabian, MD;  Location: [facility];  Service: Orthopedics;  Laterality: Left;  [name] VASECTOMY      Social History   Socioeconomic History  . Marital status: Widowed    Spouse name: Not on file  . Number of children: Not on file  . Years of education: Not on file  . Highest education level: Not on file  Occupational History  . Not on file  Social Needs  . Financial resource strain: Not on file  . Food insecurity:    Worry: Not on file    Inability: Not on file  . Transportation needs:    Medical: Not on file    Non-medical: Not on file  Tobacco Use  . Smoking status: Former Smoker    Packs/day: 0.50    Years: 5.00    Pack years: 2.50    Types: Cigarettes    Last attempt to quit: 03/13/1988    Years since quitting: 29.2  . Smokeless tobacco: Former Systems developer  . Tobacco comment: Quit at about age 60 / chewed tobacca x 1 year  Substance and Sexual Activity  . Alcohol use: No    Alcohol/week: 0.0 oz  . Drug use: No  . Sexual activity: Never  Lifestyle  . Physical activity:    Days per week: Not on file    Minutes per session: Not on file  . Stress: Not on file  Relationships  . Social connections:    Talks on phone: Not on file      Gets together: Not on file    Attends religious service: Not on file    Active member of club or organization: Not on file    Attends meetings of clubs or organizations: Not on file    Relationship status: Not on file  Other Topics Concern  . Not on file  Social History Narrative   Lives alone; Wife passed Dec 07, 2012;   Died of ovarian cancer; breast cancer; other cancer;       Family History  Problem Relation Age of Onset  . Prostate cancer  Father   . Testicular cancer Son     Review of Systems  Constitutional: Negative for chills and fever.  Respiratory: Negative for cough, shortness of breath and wheezing.   Gastrointestinal: Positive for abdominal pain (minimal). Negative for blood in stool, diarrhea, nausea and vomiting.  Neurological: Positive for dizziness (with quick turns). Negative for light-headedness and headaches.       Objective:   Vitals:   06/13/17 1333  BP: (!) 142/82  Pulse: 95  Resp: 16  Temp: 97.8 F (36.6 C)  SpO2: 97%   BP Readings from Last 3 Encounters:  06/13/17 (!) 142/82  03/08/17 (!) 148/60  02/23/17 (!) 123/57   Wt Readings from Last 3 Encounters:  06/13/17 188 lb (85.3 kg)  03/08/17 186 lb (84.4 kg)  02/23/17 182 lb (82.6 kg)   Body mass index is 24.14 kg/m.   Physical Exam    Constitutional: Appears well-developed and well-nourished. No distress.  HENT:  Head: Normocephalic and atraumatic.  Neck: Neck supple. No tracheal deviation present. No thyromegaly present.  No cervical lymphadenopathy Cardiovascular: Normal rate, regular rhythm and normal heart sounds.   No murmur heard. No carotid bruit .  No edema Pulmonary/Chest: Effort normal and breath sounds normal. No respiratory distress. No has no wheezes. No rales.  Abdomen: soft, non tender, non distended Skin: Skin is warm and dry. Not diaphoretic.  Psychiatric: Normal mood and affect. Behavior is normal.      Assessment & Plan:    See Problem List for  Assessment and Plan of chronic medical problems.

## 2017-06-14 ENCOUNTER — Encounter: Payer: Self-pay | Admitting: Internal Medicine

## 2017-07-02 ENCOUNTER — Other Ambulatory Visit: Payer: Self-pay | Admitting: Internal Medicine

## 2017-07-10 ENCOUNTER — Other Ambulatory Visit: Payer: Self-pay | Admitting: Internal Medicine

## 2017-08-09 NOTE — Progress Notes (Signed)
Subjective:    Patient ID: Ronald Stafford, male    DOB: 1930/05/22, 84 y.o.   MRN: [id number]  HPI The patient is here for an acute visit.  Right hand tingling:  It started about one week ago.  When he sits on the couch with his girlfriend and his arm is on the side of her it tingles.  The tingling decreases if he starts moving it.  At night he also has the tingling when he sleeps.  It does not occur every night.  It is mildly swollen in the morning.  The hand feels stiff at times.  He denies weakness.  Putting a heating pad on his forearm helps.  He has chronic neck pain - nothing is new in the past week.  He also has right elbow pain which is not new.       Medications and allergies reviewed with patient and updated if appropriate.  Patient Active Problem List   Diagnosis Date Noted  . Skin abnormalities 06/13/2017  . Symptomatic anemia 02/22/2017  . Acute blood loss anemia 02/22/2017  . Failed total hip arthroplasty ([place]) 02/14/2017  . Pre-op examination 12/11/2016  . Osteoarthritis of left hip 12/10/2016  . Acute right ankle pain 11/07/2016  . Acute pain of left shoulder 10/04/2016  . Chest pain 07/30/2016  . History of DVT (deep vein thrombosis) 07/30/2016  . TIA (transient ischemic attack) 07/30/2016  . Thrombocytopenia ([place]) 07/30/2016  . Carotid arterial disease ([place]) 07/20/2016  . Right sided weakness 06/27/2016  . Blurry vision 06/27/2016  . Slurred speech 06/27/2016  . Cough 01/26/2016  . Left wrist pain 08/11/2015  . Frequent headaches 06/22/2015  . Prediabetes 02/11/2015  . Episodic paroxysmal hemicrania, not intractable 02/11/2015  . Peroneal tendonitis of right lower extremity 01/12/2014  . Mass of right ankle 12/31/2013  . Right ankle instability 12/31/2013  . Radial head fracture, closed 09/18/2013  . Lateral epicondylitis of right elbow 09/16/2013  . Solitary pulmonary nodule 08/08/2012  . SPINAL STENOSIS, LUMBAR 04/06/2010  . LOW BACK PAIN SYNDROME  11/02/2009  . FOOT DROP, RIGHT 05/21/2008  . Other nonthrombocytopenic purpuras 10/21/2007  . Hyperlipidemia 12/25/2006  . Essential hypertension 12/25/2006  . GERD 12/25/2006  . PROSTATE CANCER, HX OF 12/25/2006  . POLYP, COLON 05/02/2006    Current Outpatient Medications on File Prior to Visit  Medication Sig Dispense Refill  . acetaminophen (TYLENOL) 325 MG tablet Take 650 mg by mouth every 6 (six) hours as needed for moderate pain or headache.     [name] amLODipine (NORVASC) 5 MG tablet TAKE 1 TABLET BY MOUTH ONCE DAILY 90 tablet 3  . atorvastatin (LIPITOR) 40 MG tablet Take 1 tablet (40 mg total) by mouth daily. 90 tablet 3  . clopidogrel (PLAVIX) 75 MG tablet TAKE 1 TABLET BY MOUTH ONCE DAILY 90 tablet 3  . gabapentin (NEURONTIN) 100 MG capsule TAKE 1 CAPSULE BY MOUTH ONCE DAILY AT BEDTIME 90 capsule 1  . methocarbamol (ROBAXIN) 500 MG tablet Take 1 tablet (500 mg total) by mouth every 6 (six) hours as needed for muscle spasms. 60 tablet 0  . vitamin C (ASCORBIC ACID) 250 MG tablet Take 250 mg by mouth daily.    . pantoprazole (PROTONIX) 40 MG tablet Take 1 tablet (40 mg total) by mouth 2 (two) times daily. 60 tablet 0   No current facility-administered medications on file prior to visit.     Past Medical History:  Diagnosis Date  . Cancer [facility]) 2004  prostate cancer, Dr. Risa Grill  . Colon polyps   . Deep venous thrombosis ([place]) 1997   Postop  . Depression   . GERD (gastroesophageal reflux disease)   . Hyperlipidemia   . Hypertension   . Shoulder fracture, left 02/23/2011   immobilized in sling , Dr Norris(no surgery)    Past Surgical History:  Procedure Laterality Date  . COLONOSCOPY  2006   Negative,Dr.Edwards  . colonoscopy with polypectomy      PMH of  . ESOPHAGOGASTRODUODENOSCOPY N/A 02/23/2017   Procedure: ESOPHAGOGASTRODUODENOSCOPY (EGD);  Surgeon: Otis Brace, MD;  Location: [facility];  Service: Gastroenterology;  Laterality: N/A;  . JOINT REPLACEMENT   1997, 2001   Total Hip replacement X 2  . LUMBAR SPINE SURGERY  09/28/2010   Dr Rolena Infante; for spinal stenosis  . MECKEL DIVERTICULUM EXCISION     Age 36  . PROSTATECTOMY  2004   w/ radiation, Dr Risa Grill  . TOTAL HIP REVISION Left 02/14/2017   Procedure: Left hip polyethylene EXCHANGE;  Surgeon: Gaynelle Arabian, MD;  Location: [facility];  Service: Orthopedics;  Laterality: Left;  [name] VASECTOMY      Social History   Socioeconomic History  . Marital status: Widowed    Spouse name: Not on file  . Number of children: Not on file  . Years of education: Not on file  . Highest education level: Not on file  Occupational History  . Not on file  Social Needs  . Financial resource strain: Not on file  . Food insecurity:    Worry: Not on file    Inability: Not on file  . Transportation needs:    Medical: Not on file    Non-medical: Not on file  Tobacco Use  . Smoking status: Former Smoker    Packs/day: 0.50    Years: 5.00    Pack years: 2.50    Types: Cigarettes    Last attempt to quit: 03/13/1988    Years since quitting: 29.4  . Smokeless tobacco: Former Systems developer  . Tobacco comment: Quit at about age 89 / chewed tobacca x 1 year  Substance and Sexual Activity  . Alcohol use: No    Alcohol/week: 0.0 oz  . Drug use: No  . Sexual activity: Never  Lifestyle  . Physical activity:    Days per week: Not on file    Minutes per session: Not on file  . Stress: Not on file  Relationships  . Social connections:    Talks on phone: Not on file    Gets together: Not on file    Attends religious service: Not on file    Active member of club or organization: Not on file    Attends meetings of clubs or organizations: Not on file    Relationship status: Not on file  Other Topics Concern  . Not on file  Social History Narrative   Lives alone; Wife passed 12-26-2012;   Died of ovarian cancer; breast cancer; other cancer;       Family History  Problem Relation Age of Onset  . Prostate cancer  Father   . Testicular cancer Son     Review of Systems  Constitutional: Negative for fever.  Musculoskeletal: Positive for arthralgias, neck pain and neck stiffness.  Skin: Negative for color change.  Neurological: Positive for numbness. Negative for weakness.       Objective:   Vitals:   08/10/17 1305  BP: (!) 128/54  Pulse: 79  Resp: 16  Temp:  98.1 F (36.7 C)  SpO2: 96%   BP Readings from Last 3 Encounters:  08/10/17 (!) 128/54  06/13/17 (!) 142/82  03/08/17 (!) 148/60   Wt Readings from Last 3 Encounters:  08/10/17 187 lb (84.8 kg)  06/13/17 188 lb (85.3 kg)  03/08/17 186 lb (84.4 kg)   Body mass index is 24.01 kg/m.   Physical Exam  Constitutional: He appears well-developed and well-nourished. No distress.  HENT:  Head: Normocephalic and atraumatic.  Musculoskeletal: He exhibits tenderness (mild in right elbow, no tenderness in right shoulder, wrist or hand). He exhibits no edema.  Neurological: No sensory deficit (RUE).  No weakness in right hand  Skin: Skin is warm and dry. No rash noted. He is not diaphoretic. No erythema.           Assessment & Plan:    See Problem List for Assessment and Plan of chronic medical problems.

## 2017-08-10 ENCOUNTER — Ambulatory Visit: Payer: Medicare Other | Admitting: Internal Medicine

## 2017-08-10 ENCOUNTER — Encounter: Payer: Self-pay | Admitting: Internal Medicine

## 2017-08-10 VITALS — BP 128/54 | HR 79 | Temp 98.1°F | Resp 16 | Wt 187.0 lb

## 2017-08-10 DIAGNOSIS — R200 Anesthesia of skin: Secondary | ICD-10-CM

## 2017-08-10 DIAGNOSIS — R202 Paresthesia of skin: Secondary | ICD-10-CM | POA: Insufficient documentation

## 2017-08-10 NOTE — Patient Instructions (Signed)
If your symptoms continue or worsen.  Please call and I will refer you to an orthopedic.

## 2017-08-10 NOTE — Assessment & Plan Note (Signed)
Intermittent No associated weakness ? Pinched nerve from elbow or neck Discussed referral to ortho, but he would like to hold off for now to see if it resolves Avoid positions that cause symptoms If weakness develops or symptoms worsen advised him to call

## 2017-08-20 ENCOUNTER — Other Ambulatory Visit: Payer: Self-pay | Admitting: Internal Medicine

## 2017-08-20 NOTE — Telephone Encounter (Signed)
Not on current med list. Please advise 

## 2017-09-26 ENCOUNTER — Other Ambulatory Visit: Payer: Self-pay | Admitting: Internal Medicine

## 2017-10-28 IMAGING — MR MR MRA NECK WO/W CM
4 series · 37 of 48 positions shown · IV contrast (15ml Multihance)
Comparison: None.

CLINICAL DATA: Episodes of dizziness, blurry vision, and headaches
with several falls and neck pain.

Creatinine was obtained on site at [HOSPITAL] at [HOSPITAL].
Results: Creatinine 0.9 mg/dL.
EXAM:
MRA NECK WITHOUT AND WITH CONTRAST
MRA HEAD WITHOUT CONTRAST
TECHNIQUE: Multiplanar and multiecho pulse sequences of the neck were obtained
without and with intravenous contrast. Angiographic images of the
neck were obtained using MRA technique without and with intravenous
contast.; Angiographic images of the Circle of [place] were obtained
using MRA technique without intravenous contrast.
CONTRAST:  15mL MULTIHANCE GADOBENATE DIMEGLUMINE 529 MG/ML IV SOLN

[Series 3: tof_3d_multi-slab new · axial · 0.7mm · 0.35mm/px · z∈[-85,+8]mm · 10 of 143 slices shown]
[im 1/143]
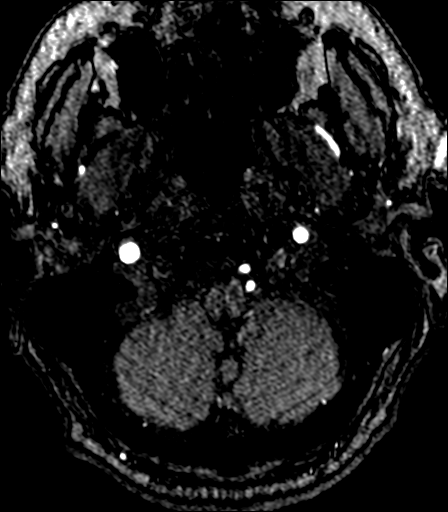
[im 8/143]
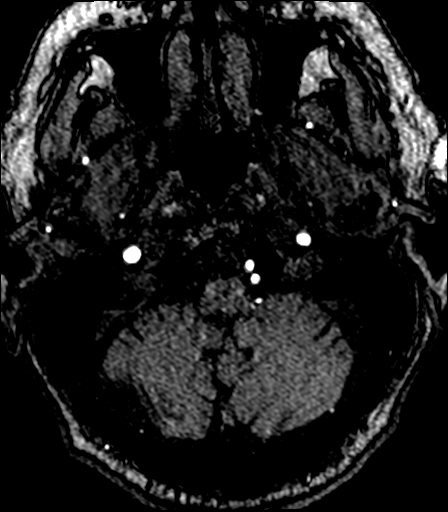
[im 24/143]
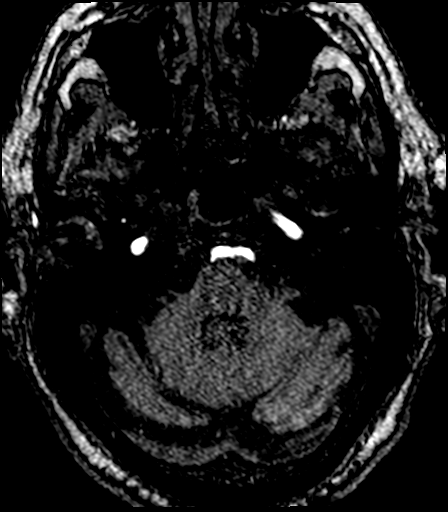
[im 40/143]
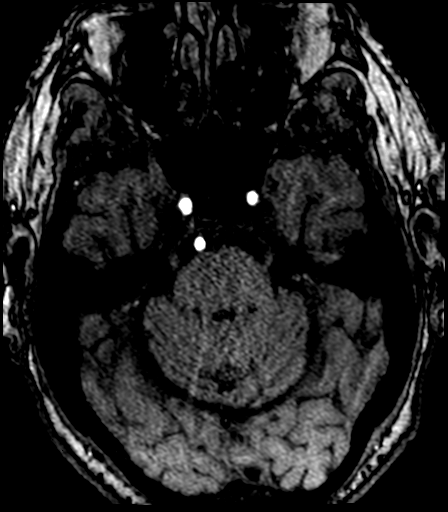
[im 64/143]
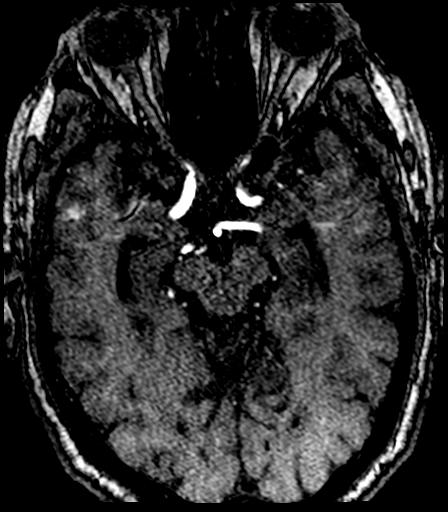
[im 72/143]
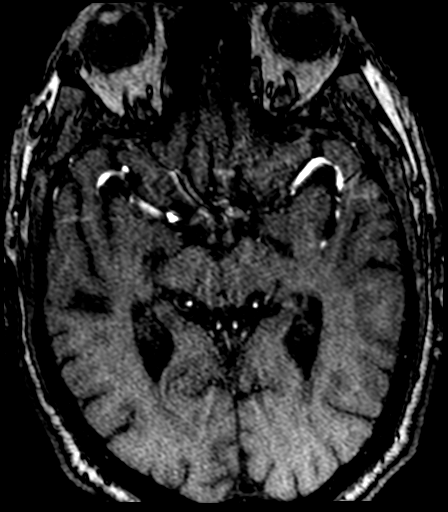
[im 79/143]
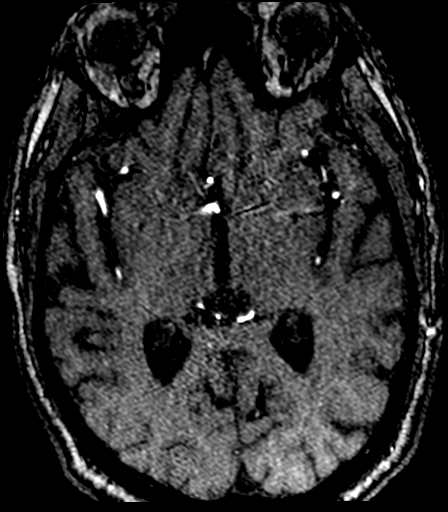
[im 103/143]
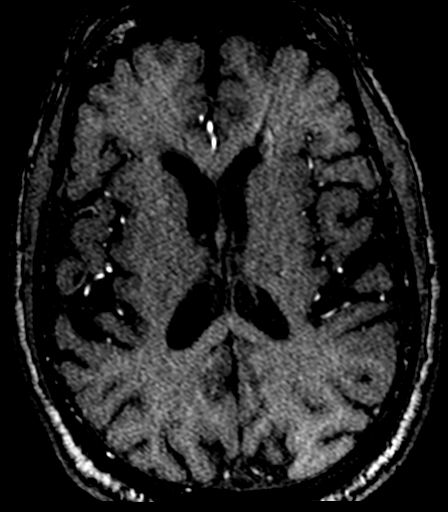
[im 119/143]
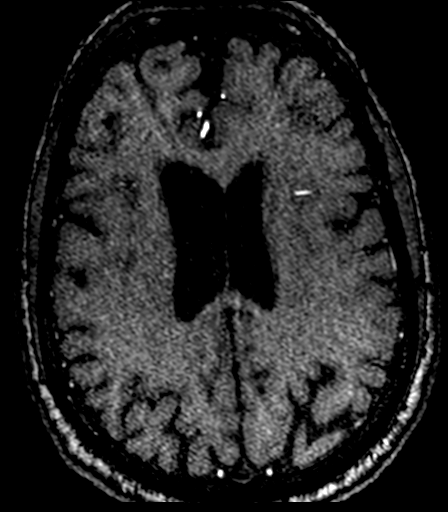
[im 135/143]
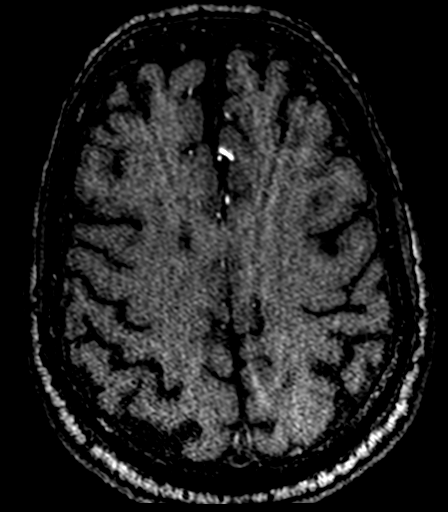

[Series 11: (id) · axial · 3.0mm · 0.98mm/px · z∈[-205,-86]mm · 8 of 60 slices shown]
[im 1/60]
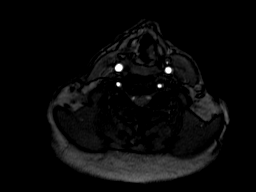
[im 9/60]
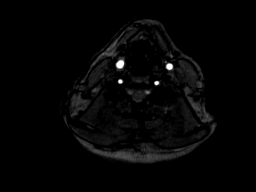
[im 17/60]
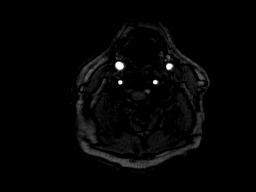
[im 26/60]
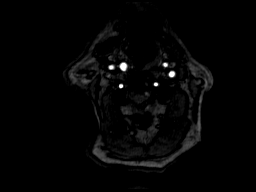
[im 34/60]
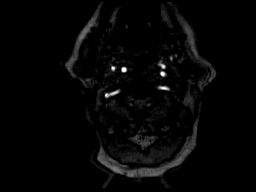
[im 43/60]
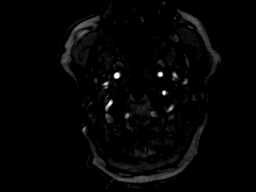
[im 51/60]
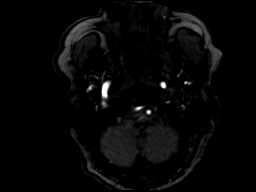
[im 60/60]
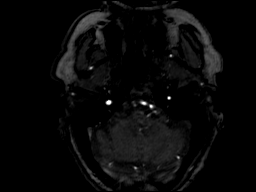

[Series 17: (id)_tt=1.0s · coronal · 0.8mm · 0.78mm/px · 11 of 84 slices shown]
[im 1/84]
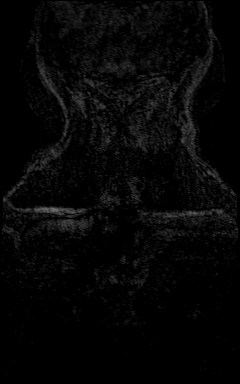
[im 9/84]
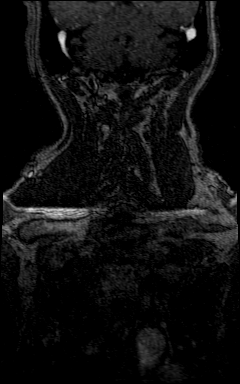
[im 17/84]
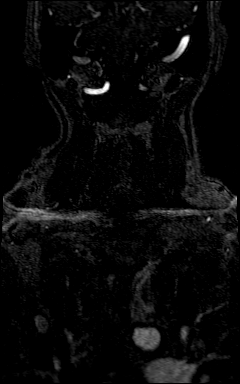
[im 25/84]
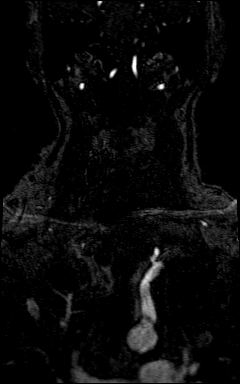
[im 34/84]
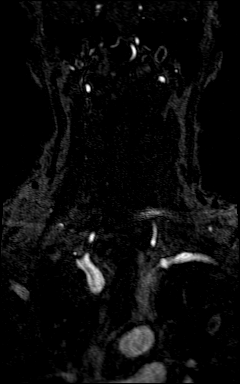
[im 42/84]
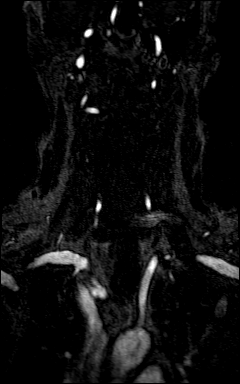
[im 50/84]
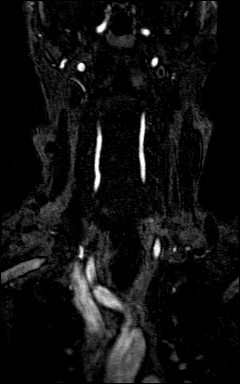
[im 59/84]
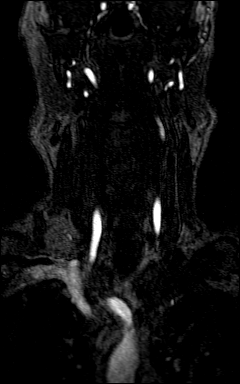
[im 67/84]
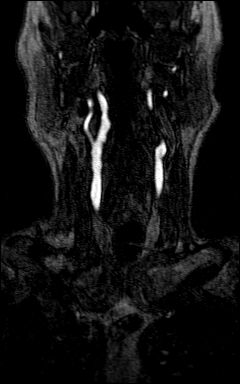
[im 75/84]
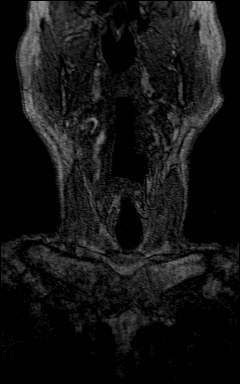
[im 84/84]
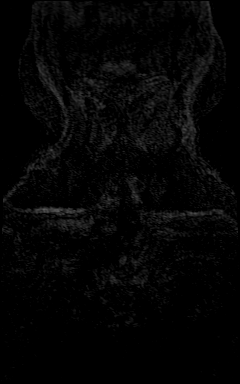

[Series 18: (id)_tt=1.0s_sub · coronal · 0.8mm · 0.78mm/px · 8 of 80 slices shown]
[im 1/80]
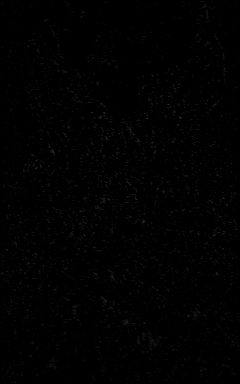
[im 9/80]
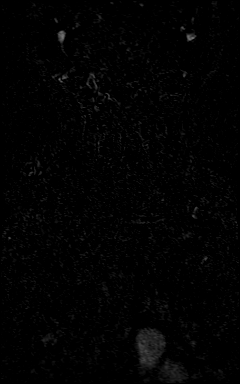
[im 27/80]
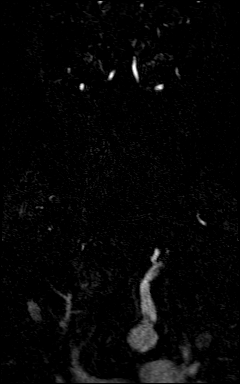
[im 36/80]
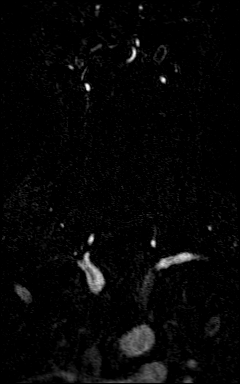
[im 44/80]
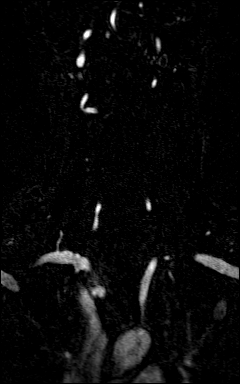
[im 53/80]
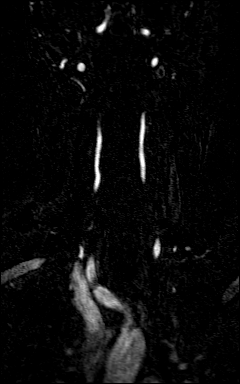
[im 71/80]
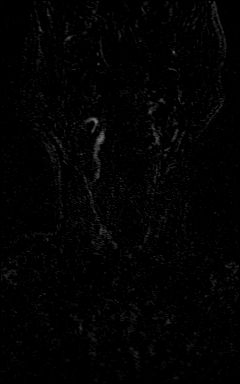
[im 80/80]
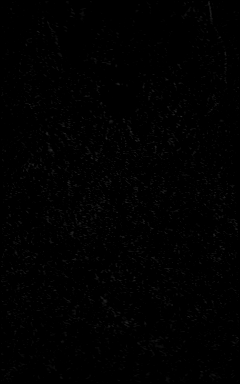

[37 of 48 positions shown; findings below may reference images not displayed]

FINDINGS: MRA NECK FINDINGS

Three vessel aortic arch. Brachiocephalic and subclavian arteries
are patent without evidence of significant stenosis. The common
carotid arteries and left internal carotid artery are unremarkable.
There is mild irregularity of the proximal right internal carotid
artery without stenosis by NASCET criteria.

The vertebral arteries are patent and codominant with antegrade flow
bilaterally. The right vertebral artery is unremarkable. There is
mild left vertebral artery origin stenosis. There is an 8 mm long
segment of signal dropout in the distal left V1 segment on MIP
images which corresponds to a band of artifact on the source images.
This portion of the left vertebral artery was not imaged on the
precontrast time-of-flight sequence.

MRA HEAD FINDINGS

The visualized distal vertebral arteries are patent and codominant.
Left PICA origin is patent. Right PICA was not imaged. Right AICA
and bilateral SCA origins are patent. Basilar artery is patent
without stenosis. Posterior communicating arteries are not
identified. PCAs are patent without evidence of significant
stenosis.

The internal carotid arteries are patent from skullbase to carotid
termini without evidence of significant stenosis. The left ICA is
mildly small in caliber diffusely compared to the right due to
absence of the left A1 segment. The right A1 segment is widely
patent and supplies the left A2 segment. MCAs are patent with mild
branch vessel irregularity but no significant proximal stenosis or
major branch vessel occlusion. No intracranial aneurysm is
identified.
IMPRESSION: 1. Widely patent cervical carotid arteries.
2. Mild left vertebral artery origin stenosis. 8 mm segment of
signal dropout in the distal left V1 segment of the left vertebral
artery corresponds to a band of artifact on source images, however
an underlying stenosis cannot be excluded. CTA could provide
evaluation of this region if clinically indicated.
3. No major branch vessel occlusion or significant proximal stenosis
in the intracranial circulation. Mild branch vessel atherosclerotic
type change.

## 2017-11-01 ENCOUNTER — Other Ambulatory Visit (INDEPENDENT_AMBULATORY_CARE_PROVIDER_SITE_OTHER): Payer: Medicare Other

## 2017-11-01 ENCOUNTER — Encounter: Payer: Self-pay | Admitting: Internal Medicine

## 2017-11-01 ENCOUNTER — Ambulatory Visit: Payer: Medicare Other | Admitting: Internal Medicine

## 2017-11-01 VITALS — BP 144/64 | HR 84 | Temp 98.5°F | Resp 16 | Ht 74.0 in | Wt 187.0 lb

## 2017-11-01 DIAGNOSIS — R319 Hematuria, unspecified: Secondary | ICD-10-CM | POA: Diagnosis not present

## 2017-11-01 LAB — URINALYSIS
Bilirubin Urine: NEGATIVE
HGB URINE DIPSTICK: NEGATIVE
Ketones, ur: NEGATIVE
Leukocytes, UA: NEGATIVE
NITRITE: NEGATIVE
SPECIFIC GRAVITY, URINE: 1.025 (ref 1.000–1.030)
TOTAL PROTEIN, URINE-UPE24: NEGATIVE
Urine Glucose: NEGATIVE
Urobilinogen, UA: 0.2 (ref 0.0–1.0)
pH: 5.5 (ref 5.0–8.0)

## 2017-11-01 LAB — POCT URINALYSIS DIPSTICK
BILIRUBIN UA: NEGATIVE
GLUCOSE UA: NEGATIVE
Ketones, UA: NEGATIVE
Leukocytes, UA: NEGATIVE
Nitrite, UA: NEGATIVE
Protein, UA: NEGATIVE
Urobilinogen, UA: NEGATIVE E.U./dL — AB
pH, UA: 6 (ref 5.0–8.0)

## 2017-11-01 NOTE — Patient Instructions (Signed)
We will call you with your urine results and decide on treatment based on those results.

## 2017-11-01 NOTE — Progress Notes (Signed)
Subjective:    Patient ID: Ronald Stafford, male    DOB: [birth year], 84 y.o.   MRN: [id number]  HPI The patient is here for an acute visit.   Hematuria:  He saw some blood in the urine 3-4 days ago.  He saw blood three times.  He has not seen any blood since then.  He is experiencing some dysuria.  He denies any difficulty urinating, increased urinary frequency, scrotal pain or swelling.  He denies nausea, abdominal pain and fevers.  He denies any new back pain.   In 2004 - prostate cancer - s/p prostatectomy.     Medications and allergies reviewed with patient and updated if appropriate.  Patient Active Problem List   Diagnosis Date Noted  . Numbness and tingling of right hand 08/10/2017  . Skin abnormalities 06/13/2017  . Symptomatic anemia 02/22/2017  . Acute blood loss anemia 02/22/2017  . Failed total hip arthroplasty ([place]) 02/14/2017  . Osteoarthritis of left hip 12/10/2016  . Acute right ankle pain 11/07/2016  . Acute pain of left shoulder 10/04/2016  . Chest pain 07/30/2016  . History of DVT (deep vein thrombosis) 07/30/2016  . TIA (transient ischemic attack) 07/30/2016  . Thrombocytopenia ([place]) 07/30/2016  . Carotid arterial disease ([place]) 07/20/2016  . Right sided weakness 06/27/2016  . Blurry vision 06/27/2016  . Slurred speech 06/27/2016  . Cough 01/26/2016  . Left wrist pain 08/11/2015  . Frequent headaches 06/22/2015  . Prediabetes 02/11/2015  . Episodic paroxysmal hemicrania, not intractable 02/11/2015  . Peroneal tendonitis of right lower extremity 01/12/2014  . Mass of right ankle 12/31/2013  . Right ankle instability 12/31/2013  . Radial head fracture, closed 09/18/2013  . Lateral epicondylitis of right elbow 09/16/2013  . Solitary pulmonary nodule 08/08/2012  . SPINAL STENOSIS, LUMBAR 04/06/2010  . LOW BACK PAIN SYNDROME 11/02/2009  . FOOT DROP, RIGHT 05/21/2008  . Other nonthrombocytopenic purpuras 10/21/2007  . Hyperlipidemia 12/25/2006  .  Essential hypertension 12/25/2006  . GERD 12/25/2006  . PROSTATE CANCER, HX OF 12/25/2006  . POLYP, COLON 05/02/2006    Current Outpatient Medications on File Prior to Visit  Medication Sig Dispense Refill  . acetaminophen (TYLENOL) 325 MG tablet Take 650 mg by mouth every 6 (six) hours as needed for moderate pain or headache.     [name] amLODipine (NORVASC) 5 MG tablet TAKE 1 TABLET BY MOUTH ONCE DAILY 90 tablet 3  . atorvastatin (LIPITOR) 40 MG tablet TAKE 1 TABLET BY MOUTH ONCE DAILY 90 tablet 3  . clopidogrel (PLAVIX) 75 MG tablet TAKE 1 TABLET BY MOUTH ONCE DAILY 90 tablet 3  . gabapentin (NEURONTIN) 100 MG capsule TAKE 1 CAPSULE BY MOUTH ONCE DAILY AT BEDTIME 90 capsule 1  . methocarbamol (ROBAXIN) 500 MG tablet Take 1 tablet (500 mg total) by mouth every 6 (six) hours as needed for muscle spasms. 60 tablet 0  . vitamin C (ASCORBIC ACID) 250 MG tablet Take 250 mg by mouth daily.    . pantoprazole (PROTONIX) 40 MG tablet Take 1 tablet (40 mg total) by mouth 2 (two) times daily. 60 tablet 0   No current facility-administered medications on file prior to visit.     Past Medical History:  Diagnosis Date  . Cancer [facility]) 2004   prostate cancer, Dr. Risa Grill  . Colon polyps   . Deep venous thrombosis ([place]) 1997   Postop  . Depression   . GERD (gastroesophageal reflux disease)   . Hyperlipidemia   . Hypertension   .  Shoulder fracture, left 02/23/2011   immobilized in sling , Dr Norris(no surgery)    Past Surgical History:  Procedure Laterality Date  . COLONOSCOPY  2006   Negative,Dr.Edwards  . colonoscopy with polypectomy      PMH of  . ESOPHAGOGASTRODUODENOSCOPY N/A 02/23/2017   Procedure: ESOPHAGOGASTRODUODENOSCOPY (EGD);  Surgeon: Otis Brace, MD;  Location: [facility];  Service: Gastroenterology;  Laterality: N/A;  . JOINT REPLACEMENT  1997, 2001   Total Hip replacement X 2  . LUMBAR SPINE SURGERY  09/28/2010   Dr Rolena Infante; for spinal stenosis  . MECKEL DIVERTICULUM  EXCISION     Age 41  . PROSTATECTOMY  2004   w/ radiation, Dr Risa Grill  . TOTAL HIP REVISION Left 02/14/2017   Procedure: Left hip polyethylene EXCHANGE;  Surgeon: Gaynelle Arabian, MD;  Location: [facility];  Service: Orthopedics;  Laterality: Left;  [name] VASECTOMY      Social History   Socioeconomic History  . Marital status: Widowed    Spouse name: Not on file  . Number of children: Not on file  . Years of education: Not on file  . Highest education level: Not on file  Occupational History  . Not on file  Social Needs  . Financial resource strain: Not on file  . Food insecurity:    Worry: Not on file    Inability: Not on file  . Transportation needs:    Medical: Not on file    Non-medical: Not on file  Tobacco Use  . Smoking status: Former Smoker    Packs/day: 0.50    Years: 5.00    Pack years: 2.50    Types: Cigarettes    Last attempt to quit: 03/13/1988    Years since quitting: 29.6  . Smokeless tobacco: Former Systems developer  . Tobacco comment: Quit at about age 61 / chewed tobacca x 1 year  Substance and Sexual Activity  . Alcohol use: No    Alcohol/week: 0.0 standard drinks  . Drug use: No  . Sexual activity: Never  Lifestyle  . Physical activity:    Days per week: Not on file    Minutes per session: Not on file  . Stress: Not on file  Relationships  . Social connections:    Talks on phone: Not on file    Gets together: Not on file    Attends religious service: Not on file    Active member of club or organization: Not on file    Attends meetings of clubs or organizations: Not on file    Relationship status: Not on file  Other Topics Concern  . Not on file  Social History Narrative   Lives alone; Wife passed 12/20/2012;   Died of ovarian cancer; breast cancer; other cancer;       Family History  Problem Relation Age of Onset  . Prostate cancer Father   . Testicular cancer Son     Review of Systems  Constitutional: Negative for chills and fever.    Gastrointestinal: Negative for abdominal pain and nausea.  Genitourinary: Positive for dysuria and hematuria. Negative for difficulty urinating, frequency, scrotal swelling and testicular pain.  Musculoskeletal: Positive for back pain (chronic, not new).  Neurological: Positive for light-headedness (chronic, no change). Negative for headaches.       Objective:   Vitals:   11/01/17 1019  BP: (!) 144/64  Pulse: 84  Resp: 16  Temp: 98.5 F (36.9 C)  SpO2: 95%   BP Readings from Last 3 Encounters:  11/01/17 (!) 144/64  08/10/17 (!) 128/54  06/13/17 (!) 142/82   Wt Readings from Last 3 Encounters:  11/01/17 187 lb (84.8 kg)  08/10/17 187 lb (84.8 kg)  06/13/17 188 lb (85.3 kg)   Body mass index is 24.01 kg/m.   Physical Exam  Constitutional: He appears well-developed and well-nourished. No distress.  HENT:  Head: Normocephalic and atraumatic.  Abdominal: He exhibits distension. There is tenderness (Mild tenderness just right below the umbilicus-he states this is not new.  No suprapubic tenderness, no tenderness elsewhere). There is no rebound and no guarding.  Genitourinary:  Genitourinary Comments: No CVA tenderness bilaterally  Skin: He is not diaphoretic.         Assessment & Plan:    See Problem List for Assessment and Plan of chronic medical problems.

## 2017-11-01 NOTE — Assessment & Plan Note (Signed)
Urine dip here shows no evidence of blood Since lab for urinalysis and culture If there is an infection we will consider treating with an antibiotic Depending on above results may need to see urology for further evaluation, but will wait for results before referring

## 2017-11-03 LAB — URINE CULTURE
MICRO NUMBER:: 91003046
SPECIMEN QUALITY: ADEQUATE

## 2017-11-04 ENCOUNTER — Encounter: Payer: Self-pay | Admitting: Internal Medicine

## 2017-11-04 ENCOUNTER — Other Ambulatory Visit: Payer: Self-pay | Admitting: Internal Medicine

## 2017-11-04 MED ORDER — AMOXICILLIN-POT CLAVULANATE 875-125 MG PO TABS: 1.0000 | ORAL_TABLET | Freq: Two times a day (BID) | ORAL | 0 refills | Status: DC

## 2017-12-13 NOTE — Patient Instructions (Addendum)
  Tests ordered today. Your results will be released to MyChart (or called to you) after review, usually within 72hours after test completion. If any changes need to be made, you will be notified at that same time.   Medications reviewed and updated.  Changes include :   None     Please followup in 6 months   

## 2017-12-13 NOTE — Progress Notes (Signed)
Subjective:    Patient ID: Ronald Stafford, male    DOB: [birth year], 84 y.o.   MRN: [id number]  HPI The patient is here for follow up.  Recurrent falls:  He fell two times in the past three weeks.  One time he turned quick and got dizzy and fell - he only fell to his knees.  The other time he slipped when getting out of the shoulder - he did not have his rug there.    Hypertension: He is taking his medication daily. He is compliant with a low sodium diet.  He denies chest pain, palpitations, edema, shortness of breath and regular headaches. He is exercising regularly.  He does not monitor his blood pressure at home.    Hyperlipidemia: He is taking his medication daily. He is compliant with a low fat/cholesterol diet. He is exercising regularly. He denies myalgias.   Prediabetes:  He is compliant with a low sugar/carbohydrate diet.  He is exercising regularly - water aerobics.  GERD:  He is taking his medication daily as prescribed.  He denies any GERD symptoms and feels his GERD is well controlled.    Medications and allergies reviewed with patient and updated if appropriate.  Patient Active Problem List   Diagnosis Date Noted  . Hematuria 11/01/2017  . Numbness and tingling of right hand 08/10/2017  . Skin abnormalities 06/13/2017  . Symptomatic anemia 02/22/2017  . Acute blood loss anemia 02/22/2017  . Failed total hip arthroplasty ([place]) 02/14/2017  . Osteoarthritis of left hip 12/10/2016  . Acute right ankle pain 11/07/2016  . Acute pain of left shoulder 10/04/2016  . Chest pain 07/30/2016  . History of DVT (deep vein thrombosis) 07/30/2016  . TIA (transient ischemic attack) 07/30/2016  . Thrombocytopenia ([place]) 07/30/2016  . Carotid arterial disease ([place]) 07/20/2016  . Right sided weakness 06/27/2016  . Blurry vision 06/27/2016  . Slurred speech 06/27/2016  . Cough 01/26/2016  . Left wrist pain 08/11/2015  . Frequent headaches 06/22/2015  . Prediabetes 02/11/2015  .  Episodic paroxysmal hemicrania, not intractable 02/11/2015  . Peroneal tendonitis of right lower extremity 01/12/2014  . Mass of right ankle 12/31/2013  . Right ankle instability 12/31/2013  . Radial head fracture, closed 09/18/2013  . Lateral epicondylitis of right elbow 09/16/2013  . Solitary pulmonary nodule 08/08/2012  . SPINAL STENOSIS, LUMBAR 04/06/2010  . LOW BACK PAIN SYNDROME 11/02/2009  . FOOT DROP, RIGHT 05/21/2008  . Other nonthrombocytopenic purpuras 10/21/2007  . Hyperlipidemia 12/25/2006  . Essential hypertension 12/25/2006  . GERD 12/25/2006  . PROSTATE CANCER, HX OF 12/25/2006  . POLYP, COLON 05/02/2006    Current Outpatient Medications on File Prior to Visit  Medication Sig Dispense Refill  . acetaminophen (TYLENOL) 325 MG tablet Take 650 mg by mouth every 6 (six) hours as needed for moderate pain or headache.     [name] amLODipine (NORVASC) 5 MG tablet TAKE 1 TABLET BY MOUTH ONCE DAILY 90 tablet 3  . amoxicillin-clavulanate (AUGMENTIN) 875-125 MG tablet Take 1 tablet by mouth 2 (two) times daily. 14 tablet 0  . atorvastatin (LIPITOR) 40 MG tablet TAKE 1 TABLET BY MOUTH ONCE DAILY 90 tablet 3  . clopidogrel (PLAVIX) 75 MG tablet TAKE 1 TABLET BY MOUTH ONCE DAILY 90 tablet 3  . gabapentin (NEURONTIN) 100 MG capsule TAKE 1 CAPSULE BY MOUTH ONCE DAILY AT BEDTIME 90 capsule 1  . methocarbamol (ROBAXIN) 500 MG tablet Take 1 tablet (500 mg total) by mouth every 6 (six) hours  as needed for muscle spasms. 60 tablet 0  . vitamin C (ASCORBIC ACID) 250 MG tablet Take 250 mg by mouth daily.    . pantoprazole (PROTONIX) 40 MG tablet Take 1 tablet (40 mg total) by mouth 2 (two) times daily. 60 tablet 0   No current facility-administered medications on file prior to visit.     Past Medical History:  Diagnosis Date  . Cancer [facility]) 2004   prostate cancer, Dr. Risa Grill  . Colon polyps   . Deep venous thrombosis ([place]) 1997   Postop  . Depression   . GERD (gastroesophageal reflux  disease)   . Hyperlipidemia   . Hypertension   . Shoulder fracture, left 02/23/2011   immobilized in sling , Dr Norris(no surgery)    Past Surgical History:  Procedure Laterality Date  . COLONOSCOPY  2006   Negative,Dr.Edwards  . colonoscopy with polypectomy      PMH of  . ESOPHAGOGASTRODUODENOSCOPY N/A 02/23/2017   Procedure: ESOPHAGOGASTRODUODENOSCOPY (EGD);  Surgeon: Otis Brace, MD;  Location: [facility];  Service: Gastroenterology;  Laterality: N/A;  . JOINT REPLACEMENT  1997, 2001   Total Hip replacement X 2  . LUMBAR SPINE SURGERY  09/28/2010   Dr Rolena Infante; for spinal stenosis  . MECKEL DIVERTICULUM EXCISION     Age 46  . PROSTATECTOMY  2004   w/ radiation, Dr Risa Grill  . TOTAL HIP REVISION Left 02/14/2017   Procedure: Left hip polyethylene EXCHANGE;  Surgeon: Gaynelle Arabian, MD;  Location: [facility];  Service: Orthopedics;  Laterality: Left;  [name] VASECTOMY      Social History   Socioeconomic History  . Marital status: Widowed    Spouse name: Not on file  . Number of children: Not on file  . Years of education: Not on file  . Highest education level: Not on file  Occupational History  . Not on file  Social Needs  . Financial resource strain: Not on file  . Food insecurity:    Worry: Not on file    Inability: Not on file  . Transportation needs:    Medical: Not on file    Non-medical: Not on file  Tobacco Use  . Smoking status: Former Smoker    Packs/day: 0.50    Years: 5.00    Pack years: 2.50    Types: Cigarettes    Last attempt to quit: 03/13/1988    Years since quitting: 29.7  . Smokeless tobacco: Former Systems developer  . Tobacco comment: Quit at about age 19 / chewed tobacca x 1 year  Substance and Sexual Activity  . Alcohol use: No    Alcohol/week: 0.0 standard drinks  . Drug use: No  . Sexual activity: Never  Lifestyle  . Physical activity:    Days per week: Not on file    Minutes per session: Not on file  . Stress: Not on file  Relationships  .  Social connections:    Talks on phone: Not on file    Gets together: Not on file    Attends religious service: Not on file    Active member of club or organization: Not on file    Attends meetings of clubs or organizations: Not on file    Relationship status: Not on file  Other Topics Concern  . Not on file  Social History Narrative   Lives alone; Wife passed 12/19/2012;   Died of ovarian cancer; breast cancer; other cancer;       Family History  Problem Relation  Age of Onset  . Prostate cancer Father   . Testicular cancer Son     Review of Systems  Constitutional: Negative for chills and fever.  Respiratory: Negative for cough, shortness of breath and wheezing.   Cardiovascular: Negative for chest pain, palpitations and leg swelling.  Neurological: Positive for dizziness (with quick movement) and light-headedness (occ). Negative for headaches.       Objective:   Vitals:   12/14/17 1317  BP: 140/82  Pulse: 74  Resp: 16  Temp: 97.9 F (36.6 C)  SpO2: 95%   BP Readings from Last 3 Encounters:  12/14/17 140/82  11/01/17 (!) 144/64  08/10/17 (!) 128/54   Wt Readings from Last 3 Encounters:  12/14/17 187 lb (84.8 kg)  11/01/17 187 lb (84.8 kg)  08/10/17 187 lb (84.8 kg)   Body mass index is 24.01 kg/m.   Physical Exam    Constitutional: Appears well-developed and well-nourished. No distress.  HENT:  Head: Normocephalic and atraumatic.  Neck: Neck supple. No tracheal deviation present. No thyromegaly present.  No cervical lymphadenopathy Cardiovascular: Normal rate, regular rhythm and normal heart sounds.   No murmur heard. No carotid bruit .  No edema Pulmonary/Chest: Effort normal and breath sounds normal. No respiratory distress. No has no wheezes. No rales.  Abdomen: soft, NT, ND Skin: Skin is warm and dry. Not diaphoretic.  Psychiatric: Normal mood and affect. Behavior is normal.      Assessment & Plan:    See Problem List for Assessment and  Plan of chronic medical problems.

## 2017-12-14 ENCOUNTER — Encounter: Payer: Self-pay | Admitting: Internal Medicine

## 2017-12-14 ENCOUNTER — Ambulatory Visit: Payer: Medicare Other | Admitting: Internal Medicine

## 2017-12-14 ENCOUNTER — Other Ambulatory Visit (INDEPENDENT_AMBULATORY_CARE_PROVIDER_SITE_OTHER): Payer: Medicare Other

## 2017-12-14 VITALS — BP 140/82 | HR 74 | Temp 97.9°F | Resp 16 | Ht 74.0 in | Wt 187.0 lb

## 2017-12-14 DIAGNOSIS — I10 Essential (primary) hypertension: Secondary | ICD-10-CM | POA: Diagnosis not present

## 2017-12-14 DIAGNOSIS — R7303 Prediabetes: Secondary | ICD-10-CM | POA: Diagnosis not present

## 2017-12-14 DIAGNOSIS — Z23 Encounter for immunization: Secondary | ICD-10-CM

## 2017-12-14 DIAGNOSIS — E785 Hyperlipidemia, unspecified: Secondary | ICD-10-CM

## 2017-12-14 DIAGNOSIS — K219 Gastro-esophageal reflux disease without esophagitis: Secondary | ICD-10-CM | POA: Diagnosis not present

## 2017-12-14 LAB — COMPREHENSIVE METABOLIC PANEL
ALBUMIN: 4.6 g/dL (ref 3.5–5.2)
ALT: 18 U/L (ref 0–53)
AST: 23 U/L (ref 0–37)
Alkaline Phosphatase: 58 U/L (ref 39–117)
BILIRUBIN TOTAL: 0.7 mg/dL (ref 0.2–1.2)
BUN: 19 mg/dL (ref 6–23)
CALCIUM: 9.5 mg/dL (ref 8.4–10.5)
CO2: 31 meq/L (ref 19–32)
CREATININE: 0.87 mg/dL (ref 0.40–1.50)
Chloride: 106 mEq/L (ref 96–112)
GFR: 88.63 mL/min (ref >60.00)
Glucose, Bld: 102 mg/dL — ABNORMAL HIGH (ref 70–99)
Potassium: 4 mEq/L (ref 3.5–5.1)
Sodium: 143 mEq/L (ref 135–145)
Total Protein: 7.1 g/dL (ref 6.0–8.3)

## 2017-12-14 LAB — CBC WITH DIFFERENTIAL/PLATELET
BASOS ABS: 0.1 10*3/uL (ref 0.0–0.1)
Basophils Relative: 2.4 % (ref 0.0–3.0)
Eosinophils Absolute: 0 10*3/uL (ref 0.0–0.7)
Eosinophils Relative: 1.1 % (ref 0.0–5.0)
HEMATOCRIT: 39.5 % (ref 39.0–52.0)
HEMOGLOBIN: 13 g/dL (ref 13.0–17.0)
Lymphocytes Relative: 18.3 % (ref 12.0–46.0)
Lymphs Abs: 0.8 10*3/uL (ref 0.7–4.0)
MCHC: 32.9 g/dL (ref 30.0–36.0)
MCV: 86.8 fl (ref 78.0–100.0)
MONOS PCT: 14.8 % — AB (ref 3.0–12.0)
Monocytes Absolute: 0.7 10*3/uL (ref 0.1–1.0)
NEUTROS PCT: 63.4 % (ref 43.0–77.0)
Neutro Abs: 2.9 10*3/uL (ref 1.4–7.7)
Platelets: 117 10*3/uL — ABNORMAL LOW (ref 150.0–400.0)
RBC: 4.56 Mil/uL (ref 4.22–5.81)
RDW: 17.1 % — ABNORMAL HIGH (ref 11.5–15.5)
WBC: 4.6 10*3/uL (ref 4.0–10.5)

## 2017-12-14 LAB — LIPID PANEL
CHOL/HDL RATIO: 3
Cholesterol: 116 mg/dL (ref 0–200)
HDL: 43.4 mg/dL (ref >39.00)
LDL Cholesterol: 55 mg/dL (ref 0–99)
NONHDL: 73.03
Triglycerides: 92 mg/dL (ref 0.0–149.0)
VLDL: 18.4 mg/dL (ref 0.0–40.0)

## 2017-12-14 LAB — HEMOGLOBIN A1C: Hgb A1c MFr Bld: 6.2 % (ref 4.6–6.5)

## 2017-12-14 NOTE — Assessment & Plan Note (Signed)
Check lipid panel  Continue daily statin Regular exercise and healthy diet encouraged  

## 2017-12-14 NOTE — Addendum Note (Signed)
Addended by: Delice Bison E on: 12/14/2017 03:30 PM   Modules accepted: Orders

## 2017-12-14 NOTE — Assessment & Plan Note (Signed)
Check a1c Low sugar / carb diet Stressed regular exercise   

## 2017-12-14 NOTE — Assessment & Plan Note (Signed)
BP well controlled Current regimen effective and well tolerated Continue current medications at current doses cmp  

## 2017-12-14 NOTE — Assessment & Plan Note (Signed)
GERD controlled Continue daily medication  

## 2017-12-16 ENCOUNTER — Encounter: Payer: Self-pay | Admitting: Internal Medicine

## 2018-01-21 ENCOUNTER — Encounter: Payer: Self-pay | Admitting: Family

## 2018-01-21 ENCOUNTER — Ambulatory Visit: Payer: Self-pay | Admitting: *Deleted

## 2018-01-21 ENCOUNTER — Ambulatory Visit (INDEPENDENT_AMBULATORY_CARE_PROVIDER_SITE_OTHER)
Admission: RE | Admit: 2018-01-21 | Discharge: 2018-01-21 | Disposition: A | Discharge status: Home / Self Care | Payer: Medicare Other | Source: Ambulatory Visit | Attending: Family | Admitting: Family

## 2018-01-21 ENCOUNTER — Ambulatory Visit: Payer: Medicare Other | Admitting: Family

## 2018-01-21 ENCOUNTER — Other Ambulatory Visit (INDEPENDENT_AMBULATORY_CARE_PROVIDER_SITE_OTHER): Payer: Medicare Other

## 2018-01-21 VITALS — BP 158/78 | HR 88 | Temp 98.1°F | Ht 74.0 in | Wt 190.1 lb

## 2018-01-21 DIAGNOSIS — R079 Chest pain, unspecified: Secondary | ICD-10-CM

## 2018-01-21 LAB — COMPREHENSIVE METABOLIC PANEL
ALBUMIN: 4.8 g/dL (ref 3.5–5.2)
ALK PHOS: 56 U/L (ref 39–117)
ALT: 17 U/L (ref 0–53)
AST: 22 U/L (ref 0–37)
BILIRUBIN TOTAL: 0.9 mg/dL (ref 0.2–1.2)
BUN: 11 mg/dL (ref 6–23)
CO2: 30 mEq/L (ref 19–32)
Calcium: 9.8 mg/dL (ref 8.4–10.5)
Chloride: 105 mEq/L (ref 96–112)
Creatinine, Ser: 0.92 mg/dL (ref 0.40–1.50)
GFR: 83.07 mL/min (ref >60.00)
Glucose, Bld: 106 mg/dL — ABNORMAL HIGH (ref 70–99)
POTASSIUM: 4.1 meq/L (ref 3.5–5.1)
Sodium: 141 mEq/L (ref 135–145)
TOTAL PROTEIN: 7.3 g/dL (ref 6.0–8.3)

## 2018-01-21 LAB — CBC WITH DIFFERENTIAL/PLATELET
BASOS ABS: 0.1 10*3/uL (ref 0.0–0.1)
Basophils Relative: 2.2 % (ref 0.0–3.0)
EOS PCT: 1 % (ref 0.0–5.0)
Eosinophils Absolute: 0 10*3/uL (ref 0.0–0.7)
HCT: 38.1 % — ABNORMAL LOW (ref 39.0–52.0)
HEMOGLOBIN: 12.5 g/dL — AB (ref 13.0–17.0)
Lymphocytes Relative: 20.3 % (ref 12.0–46.0)
Lymphs Abs: 1 10*3/uL (ref 0.7–4.0)
MCHC: 32.8 g/dL (ref 30.0–36.0)
MCV: 88.8 fl (ref 78.0–100.0)
MONO ABS: 0.6 10*3/uL (ref 0.1–1.0)
MONOS PCT: 13.4 % — AB (ref 3.0–12.0)
Neutro Abs: 3 10*3/uL (ref 1.4–7.7)
Neutrophils Relative %: 63.1 % (ref 43.0–77.0)
Platelets: 134 10*3/uL — ABNORMAL LOW (ref 150.0–400.0)
RBC: 4.29 Mil/uL (ref 4.22–5.81)
RDW: 17.5 % — ABNORMAL HIGH (ref 11.5–15.5)
WBC: 4.8 10*3/uL (ref 4.0–10.5)

## 2018-01-21 NOTE — Progress Notes (Signed)
Ronald Stafford is a 84 y.o. male with the following history as recorded in Epic[employer]:  Patient Active Problem List   Diagnosis Date Noted  . Hematuria 11/01/2017  . Numbness and tingling of right hand 08/10/2017  . Skin abnormalities 06/13/2017  . Symptomatic anemia 02/22/2017  . Acute blood loss anemia 02/22/2017  . Failed total hip arthroplasty ([place]) 02/14/2017  . Osteoarthritis of left hip 12/10/2016  . Acute right ankle pain 11/07/2016  . Acute pain of left shoulder 10/04/2016  . Chest pain 07/30/2016  . History of DVT (deep vein thrombosis) 07/30/2016  . TIA (transient ischemic attack) 07/30/2016  . Thrombocytopenia ([place]) 07/30/2016  . Carotid arterial disease ([place]) 07/20/2016  . Right sided weakness 06/27/2016  . Blurry vision 06/27/2016  . Slurred speech 06/27/2016  . Cough 01/26/2016  . Left wrist pain 08/11/2015  . Frequent headaches 06/22/2015  . Prediabetes 02/11/2015  . Episodic paroxysmal hemicrania, not intractable 02/11/2015  . Peroneal tendonitis of right lower extremity 01/12/2014  . Mass of right ankle 12/31/2013  . Right ankle instability 12/31/2013  . Radial head fracture, closed 09/18/2013  . Lateral epicondylitis of right elbow 09/16/2013  . Solitary pulmonary nodule 08/08/2012  . SPINAL STENOSIS, LUMBAR 04/06/2010  . LOW BACK PAIN SYNDROME 11/02/2009  . FOOT DROP, RIGHT 05/21/2008  . Other nonthrombocytopenic purpuras 10/21/2007  . Hyperlipidemia 12/25/2006  . Essential hypertension 12/25/2006  . GERD 12/25/2006  . PROSTATE CANCER, HX OF 12/25/2006  . POLYP, COLON 05/02/2006    Current Outpatient Medications  Medication Sig Dispense Refill  . acetaminophen (TYLENOL) 325 MG tablet Take 650 mg by mouth every 6 (six) hours as needed for moderate pain or headache.     [name] amLODipine (NORVASC) 5 MG tablet TAKE 1 TABLET BY MOUTH ONCE DAILY 90 tablet 3  . atorvastatin (LIPITOR) 40 MG tablet TAKE 1 TABLET BY MOUTH ONCE DAILY 90 tablet 3  . clopidogrel  (PLAVIX) 75 MG tablet TAKE 1 TABLET BY MOUTH ONCE DAILY 90 tablet 3  . gabapentin (NEURONTIN) 100 MG capsule TAKE 1 CAPSULE BY MOUTH ONCE DAILY AT BEDTIME 90 capsule 1  . Omega-3 Fatty Acids (FISH OIL) 500 MG CAPS Take by mouth.    . vitamin C (ASCORBIC ACID) 250 MG tablet Take 250 mg by mouth daily.     No current facility-administered medications for this visit.     Allergies: Chicken allergy  Past Medical History:  Diagnosis Date  . Cancer [facility]) 2004   prostate cancer, Dr. Risa Grill  . Colon polyps   . Deep venous thrombosis ([place]) 1997   Postop  . Depression   . GERD (gastroesophageal reflux disease)   . Hyperlipidemia   . Hypertension   . Shoulder fracture, left 02/23/2011   immobilized in sling , Dr Norris(no surgery)    Past Surgical History:  Procedure Laterality Date  . COLONOSCOPY  2006   Negative,Dr.Edwards  . colonoscopy with polypectomy      PMH of  . ESOPHAGOGASTRODUODENOSCOPY N/A 02/23/2017   Procedure: ESOPHAGOGASTRODUODENOSCOPY (EGD);  Surgeon: Otis Brace, MD;  Location: [facility];  Service: Gastroenterology;  Laterality: N/A;  . JOINT REPLACEMENT  1997, 2001   Total Hip replacement X 2  . LUMBAR SPINE SURGERY  09/28/2010   Dr Rolena Infante; for spinal stenosis  . MECKEL DIVERTICULUM EXCISION     Age 54  . PROSTATECTOMY  2004   w/ radiation, Dr Risa Grill  . TOTAL HIP REVISION Left 02/14/2017   Procedure: Left hip polyethylene EXCHANGE;  Surgeon: Wynelle Link,  Pilar Plate, MD;  Location: [facility];  Service: Orthopedics;  Laterality: Left;  [name] VASECTOMY      Family History  Problem Relation Age of Onset  . Prostate cancer Father   . Testicular cancer Son     Social History   Tobacco Use  . Smoking status: Former Smoker    Packs/day: 0.50    Years: 5.00    Pack years: 2.50    Types: Cigarettes    Last attempt to quit: 03/13/1988    Years since quitting: 29.8  . Smokeless tobacco: Former Systems developer  . Tobacco comment: Quit at about age 84 / chewed tobacca x 1 year   Substance Use Topics  . Alcohol use: No    Alcohol/week: 0.0 standard drinks    Subjective:  1 week history of left sided chest pain x 1 week; no chest pain on exertion; notes that pain is "intermittent."- describes the sensation as fleeting sensation of pain for about 3-5 seconds; may have 3 episodes per day; No concerns for cough/ increased sensation of shortness of breath; increased dizziness; no vision changes; history of GERD- not currently on medication but denies increased burping/ belching; had PE in 1997 after hip replacement- no swelling or pain in either lower leg;     Objective:  Vitals:   01/21/18 1026  BP: (!) 158/78  Pulse: 88  Temp: 98.1 F (36.7 C)  TempSrc: Oral  SpO2: 98%  Weight: 190 lb 1.3 oz (86.2 kg)  Height: 6' 2"  (1.88 m)    General: Well developed, well nourished, in no acute distress  Skin : Warm and dry.  Head: Normocephalic and atraumatic  Eyes: Sclera and conjunctiva clear; pupils round and reactive to light; extraocular movements intact  Ears: External normal; canals clear; tympanic membranes normal  Oropharynx: Pink, supple. No suspicious lesions  Neck: Supple without thyromegaly, adenopathy  Lungs: Respirations unlabored; clear to auscultation bilaterally without wheeze, rales, rhonchi  CVS exam: normal rate and regular rhythm.  Abdomen: Soft; nontender; nondistended; normoactive bowel sounds; no masses or hepatosplenomegaly  Musculoskeletal: No deformities; no active joint inflammation  Extremities: No edema, cyanosis, clubbing  Vessels: Symmetric bilaterally  Neurologic: Alert and oriented; speech intact; face symmetrical; moves all extremities well; CNII-XII intact without focal deficit   Assessment:  1. Chest pain, unspecified type     Plan:  ? Etiology; EKG shows normal EKG- low suspicion for cardiac source; ? Esophageal spasm; update CXR today; check CBC, CMP, d-dimer today; follow-up to be determined.   No follow-ups on file.   Orders Placed This Encounter  Procedures  . DG Chest 2 View    Standing Status:   Future    Standing Expiration Date:   03/24/2019    Order Specific Question:   Reason for Exam (SYMPTOM  OR DIAGNOSIS REQUIRED)    Answer:   chest pain    Order Specific Question:   Preferred imaging location?    Answer:   [facility]    Order Specific Question:   Radiology Contrast Protocol - do NOT remove file path    Answer:   [email or web address]  . CBC w/Diff    Standing Status:   Future    Number of Occurrences:   1    Standing Expiration Date:   01/21/2019  . D-Dimer, Quantitative    Standing Status:   Future    Number of Occurrences:   1    Standing Expiration Date:   01/21/2019  . Comp Met (CMET)  Standing Status:   Future    Number of Occurrences:   1    Standing Expiration Date:   01/21/2019  . EKG 12-Lead    Requested Prescriptions    No prescriptions requested or ordered in this encounter

## 2018-01-21 NOTE — Telephone Encounter (Signed)
Pt called with complaints of stomach problems ("hurts on left side of stomach"), feeling lightheaded ("feels tired") and left chest pain which started 01/20/18; this has occurred intermittently; recommendations made per nurse triage protocol; the pt normally sees Dr Quay Burow but she has no availability today; pt offered and accepted appointment with Jodi Mourning, [place], 01/21/18 at 1020; he verbalized understanding; will route to office for notification of this upcoming appointment.    Reason for Disposition . [1] Chest pain lasting <= 5 minutes AND [2] NO chest pain or cardiac symptoms now(Exceptions: pains lasting a few seconds)  Answer Assessment - Initial Assessment Questions 1. LOCATION: "Where does it hurt?"       Left chest/left breast 2. RADIATION: "Does the pain go anywhere else?" (e.g., into neck, jaw, arms, back)     no 3. ONSET: "When did the chest pain begin?" (Minutes, hours or days)      01/20/18 about 0900 4. PATTERN "Does the pain come and go, or has it been constant since it started?"  "Does it get worse with exertion?"      Intermittent; not worse with exertion 5. DURATION: "How long does it last" (e.g., seconds, minutes, hours)     2-3 minutes 6. SEVERITY: "How bad is the pain?"  (e.g., Scale 1-10; mild, moderate, or severe)    - MILD (1-3): doesn't interfere with normal activities     - MODERATE (4-7): interferes with normal activities or awakens from sleep    - SEVERE (8-10): excruciating pain, unable to do any normal activities       mild 7. CARDIAC RISK FACTORS: "Do you have any history of heart problems or risk factors for heart disease?" (e.g., prior heart attack, angina; high blood pressure, diabetes, being overweight, high cholesterol, smoking, or strong family history of heart disease)     Hyptertension, pre-diabetes, high cholesterol 8. PULMONARY RISK FACTORS: "Do you have any history of lung disease?"  (e.g., blood clots in lung, asthma, emphysema, birth control  pills)     Blood clot after hip replacement in 1997 9. CAUSE: "What do you think is causing the chest pain?"     Not sure 10. OTHER SYMPTOMS: "Do you have any other symptoms?" (e.g., dizziness, nausea, vomiting, sweating, fever, difficulty breathing, cough)      Intermittent left abdominal pain 11. PREGNANCY: "Is there any chance you are pregnant?" "When was your last menstrual period?"       n/a  Protocols used: CHEST PAIN-A-AH

## 2018-01-22 ENCOUNTER — Other Ambulatory Visit: Payer: Self-pay | Admitting: Family

## 2018-01-22 DIAGNOSIS — R0602 Shortness of breath: Secondary | ICD-10-CM

## 2018-01-22 LAB — D-DIMER, QUANTITATIVE (NOT AT ARMC): D DIMER QUANT: 0.59 ug{FEU}/mL — AB (ref <0.50)

## 2018-01-23 ENCOUNTER — Other Ambulatory Visit: Payer: Self-pay | Admitting: Family

## 2018-01-23 ENCOUNTER — Ambulatory Visit (INDEPENDENT_AMBULATORY_CARE_PROVIDER_SITE_OTHER)
Admission: RE | Admit: 2018-01-23 | Discharge: 2018-01-23 | Disposition: A | Discharge status: Home / Self Care | Payer: Medicare Other | Source: Ambulatory Visit | Attending: Family | Admitting: Family

## 2018-01-23 DIAGNOSIS — R0602 Shortness of breath: Secondary | ICD-10-CM | POA: Diagnosis not present

## 2018-01-23 MED ORDER — PANTOPRAZOLE SODIUM 40 MG PO TBEC: 40.0000 mg | DELAYED_RELEASE_TABLET | Freq: Two times a day (BID) | ORAL | 0 refills | Status: DC

## 2018-01-23 MED ORDER — IOPAMIDOL (ISOVUE-370) INJECTION 76%
80.0000 mL | Freq: Once | INTRAVENOUS | Status: AC | PRN
  Administered 2018-01-23: 80 mL via INTRAVENOUS

## 2018-02-17 ENCOUNTER — Other Ambulatory Visit: Payer: Self-pay | Admitting: Internal Medicine

## 2018-02-27 ENCOUNTER — Other Ambulatory Visit: Payer: Self-pay | Admitting: Family

## 2018-02-27 NOTE — Progress Notes (Deleted)
Subjective:   Ronald Stafford is a 84 y.o. male who presents for Medicare Annual/Subsequent preventive examination.  Review of Systems:  No ROS.  Medicare Wellness Visit. Additional risk factors are reflected in the social history.    Sleep patterns: {SX; SLEEP PATTERNS:18802::"feels rested on waking","does not get up to void","gets up *** times nightly to void","sleeps *** hours nightly"}.   Home Safety/Smoke Alarms: Feels safe in home. Smoke alarms in place.  Living environment; residence and Firearm Safety: {Rehab home environment / accessibility:30080::"no firearms","firearms stored safely"}. Seat Belt Safety/Bike Helmet: Wears seat belt.     Objective:    Vitals: There were no vitals taken for this visit.  There is no height or weight on file to calculate BMI.  Advanced Directives 02/22/2017 02/22/2017 02/14/2017 02/14/2017 02/09/2017 07/30/2016 07/19/2015  Does Patient Have a Medical Advance Directive? Yes Yes Yes Yes Yes Yes Yes  Type of Paramedic of [place];Living will [place];Living will [place];Living will [place];Living will [place];Living will Living will -  Does patient want to make changes to medical advance directive? No - Patient declined - No - Patient declined No - Patient declined No - Patient declined No - Patient declined -  Copy of [place] in Chart? No - copy requested No - copy requested Yes Yes Yes - -  Would patient like information on creating a medical advance directive? - - - - - No - Patient declined -    Tobacco Social History   Tobacco Use  Smoking Status Former Smoker  . Packs/day: 0.50  . Years: 5.00  . Pack years: 2.50  . Types: Cigarettes  . Last attempt to quit: 03/13/1988  . Years since quitting: 29.9  Smokeless Tobacco Former Systems developer  Tobacco Comment   Quit at about age 67 / chewed tobacca x 1 year     Counseling  given: Not Answered Comment: Quit at about age 54 / chewed tobacca x 1 year  Past Medical History:  Diagnosis Date  . Cancer [facility]) 2004   prostate cancer, Dr. Risa Grill  . Colon polyps   . Deep venous thrombosis ([place]) 1997   Postop  . Depression   . GERD (gastroesophageal reflux disease)   . Hyperlipidemia   . Hypertension   . Shoulder fracture, left 02/23/2011   immobilized in sling , Dr Norris(no surgery)   Past Surgical History:  Procedure Laterality Date  . COLONOSCOPY  2006   Negative,Dr.Edwards  . colonoscopy with polypectomy      PMH of  . ESOPHAGOGASTRODUODENOSCOPY N/A 02/23/2017   Procedure: ESOPHAGOGASTRODUODENOSCOPY (EGD);  Surgeon: Otis Brace, MD;  Location: [facility];  Service: Gastroenterology;  Laterality: N/A;  . JOINT REPLACEMENT  1997, 2001   Total Hip replacement X 2  . LUMBAR SPINE SURGERY  09/28/2010   Dr Rolena Infante; for spinal stenosis  . MECKEL DIVERTICULUM EXCISION     Age 80  . PROSTATECTOMY  2004   w/ radiation, Dr Risa Grill  . TOTAL HIP REVISION Left 02/14/2017   Procedure: Left hip polyethylene EXCHANGE;  Surgeon: Gaynelle Arabian, MD;  Location: [facility];  Service: Orthopedics;  Laterality: Left;  [name] VASECTOMY     Family History  Problem Relation Age of Onset  . Prostate cancer Father   . Testicular cancer Son    Social History   Socioeconomic History  . Marital status: Widowed    Spouse name: Not on file  . Number  of children: Not on file  . Years of education: Not on file  . Highest education level: Not on file  Occupational History  . Not on file  Social Needs  . Financial resource strain: Not on file  . Food insecurity:    Worry: Not on file    Inability: Not on file  . Transportation needs:    Medical: Not on file    Non-medical: Not on file  Tobacco Use  . Smoking status: Former Smoker    Packs/day: 0.50    Years: 5.00    Pack years: 2.50    Types: Cigarettes    Last attempt to quit: 03/13/1988    Years since quitting: 29.9   . Smokeless tobacco: Former Systems developer  . Tobacco comment: Quit at about age 89 / chewed tobacca x 1 year  Substance and Sexual Activity  . Alcohol use: No    Alcohol/week: 0.0 standard drinks  . Drug use: No  . Sexual activity: Never  Lifestyle  . Physical activity:    Days per week: Not on file    Minutes per session: Not on file  . Stress: Not on file  Relationships  . Social connections:    Talks on phone: Not on file    Gets together: Not on file    Attends religious service: Not on file    Active member of club or organization: Not on file    Attends meetings of clubs or organizations: Not on file    Relationship status: Not on file  Other Topics Concern  . Not on file  Social History Narrative   Lives alone; Wife passed 2012/12/25;   Died of ovarian cancer; breast cancer; other cancer;       Outpatient Encounter Medications as of 02/28/2018  Medication Sig  . acetaminophen (TYLENOL) 325 MG tablet Take 650 mg by mouth every 6 (six) hours as needed for moderate pain or headache.   [name] amLODipine (NORVASC) 5 MG tablet TAKE 1 TABLET BY MOUTH ONCE DAILY  . atorvastatin (LIPITOR) 40 MG tablet TAKE 1 TABLET BY MOUTH ONCE DAILY  . clopidogrel (PLAVIX) 75 MG tablet TAKE 1 TABLET BY MOUTH ONCE DAILY  . gabapentin (NEURONTIN) 100 MG capsule TAKE 1 CAPSULE BY MOUTH ONCE DAILY AT BEDTIME  . Omega-3 Fatty Acids (FISH OIL) 500 MG CAPS Take by mouth.  . pantoprazole (PROTONIX) 40 MG tablet Take 1 tablet (40 mg total) by mouth 2 (two) times daily.  . vitamin C (ASCORBIC ACID) 250 MG tablet Take 250 mg by mouth daily.   No facility-administered encounter medications on file as of 02/28/2018.     Activities of Daily Living No flowsheet data found.  Patient Care Team: Binnie Rail, MD as PCP - General (Internal Medicine) Rana Snare, MD as Consulting Physician (Urology) Marybelle Killings, MD as Consulting Physician (Orthopedic Surgery)   Assessment:   This is a routine wellness  examination for [name]. Physical assessment deferred to PCP.   Exercise Activities and Dietary recommendations   Diet (meal preparation, eat out, water intake, caffeinated beverages, dairy products, fruits and vegetables): {Desc; diets:16563}   Goals      Patient Stated   . <enter goal here> (pt-stated)     To continue to enjoy life/ Discussed walking everyday would help increase HDL     . exercise (pt-stated)     Will have right foot checked so he can walk more;  Will also try to cut back on sodium  Fall Risk Fall Risk  12/14/2017 08/02/2016 08/19/2015 07/19/2015 04/14/2015  Falls in the past year? Yes No No No -  Number falls in past yr: 2 or more - - - 2 or more  Injury with Fall? Yes - - - Yes  Risk Factor Category  High Fall Risk - - - High Fall Risk  Risk for fall due to : - - - - -  Risk for fall due to: Comment - - - - -  Follow up - - - - Falls evaluation completed    Depression Screen PHQ 2/9 Scores 08/02/2016 07/19/2015 07/14/2014 03/17/2014  PHQ - 2 Score 0 0 0 0    Cognitive Function MMSE - Mini Mental State Exam 07/19/2015 07/14/2014  Not completed: (No Data) Unable to complete        Immunization History  Administered Date(s) Administered  . Influenza Split 12/20/2010, 12/13/2011  . Influenza Whole 12/06/2007, 01/05/2009, 12/06/2009  . Influenza, High Dose Seasonal PF 12/18/2013, 12/07/2014, 12/17/2015, 12/14/2017  . Influenza,inj,Quad PF,6+ Mos 01/01/2013  . Influenza-Unspecified 11/27/2016  . Pneumococcal Conjugate-13 07/14/2014  . Pneumococcal Polysaccharide-23 02/28/2013  . Tdap 06/06/2005, 04/04/2016  . Zoster 03/01/2011   Screening Tests Health Maintenance  Topic Date Due  . TETANUS/TDAP  04/04/2026  . INFLUENZA VACCINE  Completed  . PNA vac Low Risk Adult  Completed      Plan:     I have personally reviewed and noted the following in the patient's chart:   . Medical and social history . Use of alcohol, tobacco or illicit drugs   . Current medications and supplements . Functional ability and status . Nutritional status . Physical activity . Advanced directives . List of other physicians . Vitals . Screenings to include cognitive, depression, and falls . Referrals and appointments  In addition, I have reviewed and discussed with patient certain preventive protocols, quality metrics, and best practice recommendations. A written personalized care plan for preventive services as well as general preventive health recommendations were provided to patient.     Michiel Cowboy, RN  02/27/2018

## 2018-02-27 NOTE — Telephone Encounter (Signed)
Does he want this or did pharmacy generate?

## 2018-02-28 ENCOUNTER — Ambulatory Visit: Payer: Medicare Other

## 2018-04-17 ENCOUNTER — Telehealth: Payer: Self-pay | Admitting: *Deleted

## 2018-04-17 DIAGNOSIS — H2513 Age-related nuclear cataract, bilateral: Secondary | ICD-10-CM | POA: Diagnosis not present

## 2018-04-17 NOTE — Progress Notes (Addendum)
Subjective:   Ronald Stafford is a 85 y.o. male who presents for Medicare Annual/Subsequent preventive examination.  Review of Systems:  No ROS.  Medicare Wellness Visit. Additional risk factors are reflected in the social history.  Cardiac Risk Factors include: advanced age (>95men, >19 women);dyslipidemia;hypertension;male gender Sleep patterns: feels rested on waking, gets up 1 times nightly to void and sleeps 6-7 hours nightly.    Home Safety/Smoke Alarms: Feels safe in home. Smoke alarms in place.  Living environment; residence and Adult nurse: split level / walkout, equipment: Radio producer, Type: [place]. Lives alone, no needs for DME, good support system Seat Belt Safety/Bike Helmet: Wears seat belt.     Objective:    Vitals: BP 138/82   Pulse 92   Resp 17   Ht 6\' 2"  (1.88 m)   Wt 195 lb (88.5 kg)   SpO2 99%   BMI 25.04 kg/m   Body mass index is 25.04 kg/m.  Advanced Directives 04/18/2018 02/22/2017 02/22/2017 02/14/2017 02/14/2017 02/09/2017 07/30/2016  Does Patient Have a Medical Advance Directive? Yes Yes Yes Yes Yes Yes Yes  Type of Paramedic of [place];Living will [place];Living will [place];Living will [place];Living will [place];Living will [place];Living will Living will  Does patient want to make changes to medical advance directive? - No - Patient declined - No - Patient declined No - Patient declined No - Patient declined No - Patient declined  Copy of [place] in Chart? No - copy requested No - copy requested No - copy requested Yes Yes Yes -  Would patient like information on creating a medical advance directive? - - - - - - No - Patient declined    Tobacco Social History   Tobacco Use  Smoking Status Former Smoker  . Packs/day: 0.50  . Years: 5.00  . Pack years: 2.50  . Types: Cigarettes  . Last attempt  to quit: 03/13/1988  . Years since quitting: 30.1  Smokeless Tobacco Former Systems developer  Tobacco Comment   Quit at about age 68 / chewed tobacca x 1 year     Counseling given: Not Answered Comment: Quit at about age 72 / chewed tobacca x 1 year  Past Medical History:  Diagnosis Date  . Cancer [facility]) 2004   prostate cancer, Dr. Risa Grill  . Colon polyps   . Deep venous thrombosis ([place]) 1997   Postop  . Depression   . GERD (gastroesophageal reflux disease)   . Hyperlipidemia   . Hypertension   . Shoulder fracture, left 02/23/2011   immobilized in sling , Dr Norris(no surgery)   Past Surgical History:  Procedure Laterality Date  . COLONOSCOPY  2006   Negative,Dr.Edwards  . colonoscopy with polypectomy      PMH of  . ESOPHAGOGASTRODUODENOSCOPY N/A 02/23/2017   Procedure: ESOPHAGOGASTRODUODENOSCOPY (EGD);  Surgeon: Otis Brace, MD;  Location: [facility];  Service: Gastroenterology;  Laterality: N/A;  . JOINT REPLACEMENT  1997, 2001   Total Hip replacement X 2  . LUMBAR SPINE SURGERY  09/28/2010   Dr Rolena Infante; for spinal stenosis  . MECKEL DIVERTICULUM EXCISION     Age 8  . PROSTATECTOMY  2004   w/ radiation, Dr Risa Grill  . TOTAL HIP REVISION Left 02/14/2017   Procedure: Left hip polyethylene EXCHANGE;  Surgeon: Gaynelle Arabian, MD;  Location: [facility];  Service: Orthopedics;  Laterality: Left;  [name] VASECTOMY  Family History  Problem Relation Age of Onset  . Prostate cancer Father   . Testicular cancer Son    Social History   Socioeconomic History  . Marital status: Widowed    Spouse name: Not on file  . Number of children: 2  . Years of education: Not on file  . Highest education level: Not on file  Occupational History  . Occupation: retired  Scientific laboratory technician  . Financial resource strain: Not hard at all  . Food insecurity:    Worry: Never true    Inability: Never true  . Transportation needs:    Medical: No    Non-medical: No  Tobacco Use  . Smoking status: Former  Smoker    Packs/day: 0.50    Years: 5.00    Pack years: 2.50    Types: Cigarettes    Last attempt to quit: 03/13/1988    Years since quitting: 30.1  . Smokeless tobacco: Former Systems developer  . Tobacco comment: Quit at about age 58 / chewed tobacca x 1 year  Substance and Sexual Activity  . Alcohol use: No    Alcohol/week: 0.0 standard drinks  . Drug use: No  . Sexual activity: Never  Lifestyle  . Physical activity:    Days per week: 4 days    Minutes per session: 50 min  . Stress: Not at all  Relationships  . Social connections:    Talks on phone: More than three times a week    Gets together: More than three times a week    Attends religious service: More than 4 times per year    Active member of club or organization: Yes    Attends meetings of clubs or organizations: More than 4 times per year    Relationship status: Widowed  Other Topics Concern  . Not on file  Social History Narrative   Lives alone; Wife passed 2012-12-28;   Died of ovarian cancer; breast cancer; other cancer;       Outpatient Encounter Medications as of 04/18/2018  Medication Sig  . acetaminophen (TYLENOL) 325 MG tablet Take 650 mg by mouth every 6 (six) hours as needed for moderate pain or headache.   [name] amLODipine (NORVASC) 5 MG tablet TAKE 1 TABLET BY MOUTH ONCE DAILY  . atorvastatin (LIPITOR) 40 MG tablet TAKE 1 TABLET BY MOUTH ONCE DAILY  . clopidogrel (PLAVIX) 75 MG tablet TAKE 1 TABLET BY MOUTH ONCE DAILY  . gabapentin (NEURONTIN) 100 MG capsule TAKE 1 CAPSULE BY MOUTH ONCE DAILY AT BEDTIME  . Omega-3 Fatty Acids (FISH OIL) 500 MG CAPS Take by mouth.  . pantoprazole (PROTONIX) 40 MG tablet TAKE 1 TABLET BY MOUTH TWICE DAILY  . vitamin C (ASCORBIC ACID) 250 MG tablet Take 250 mg by mouth daily.   No facility-administered encounter medications on file as of 04/18/2018.     Activities of Daily Living In your present state of health, do you have any difficulty performing the following activities:  04/18/2018  Hearing? N  Vision? N  Difficulty concentrating or making decisions? N  Walking or climbing stairs? N  Dressing or bathing? N  Doing errands, shopping? N  Preparing Food and eating ? N  Using the Toilet? N  In the past six months, have you accidently leaked urine? Y  Comment basline after prostate cancer  Do you have problems with loss of bowel control? N  Managing your Medications? N  Managing your Finances? N  Housekeeping or managing your Housekeeping? N  Some recent data might be hidden    Patient Care Team: Binnie Rail, MD as PCP - General (Internal Medicine) Rana Snare, MD as Consulting Physician (Urology) Marybelle Killings, MD as Consulting Physician (Orthopedic Surgery)   Assessment:   This is a routine wellness examination for [place]. Physical assessment deferred to PCP.  Exercise Activities and Dietary recommendations Current Exercise Habits: Home exercise routine;Structured exercise class, Type of exercise: walking(water aerobics), Time (Minutes): 50, Frequency (Times/Week): 4, Weekly Exercise (Minutes/Week): 200, Intensity: Mild, Exercise limited by: orthopedic condition(s) Diet (meal preparation, eat out, water intake, caffeinated beverages, dairy products, fruits and vegetables): in general, a "healthy" diet  , well balanced   Reviewed heart healthy and diabetic diet. Encouraged patient to increase daily water and healthy fluid intake.  Goals      Patient Stated   . <enter goal here> (pt-stated)     To continue to enjoy life/ Discussed walking everyday would help increase HDL     . exercise (pt-stated)     Will have right foot checked so he can walk more;  Will also try to cut back on sodium       Other   . Patient Stated     Stay physically and socially active. Continue to eat healthy and exercise.       Fall Risk Fall Risk  04/18/2018 12/14/2017 08/02/2016 08/19/2015 07/19/2015  Falls in the past year? 1 Yes No No No  Number falls in past yr:  0 2 or more - - -  Injury with Fall? 0 Yes - - -  Risk Factor Category  - High Fall Risk - - -  Risk for fall due to : - - - - -  Risk for fall due to: Comment - - - - -  Follow up Falls prevention discussed;Education provided - - - -    Depression Screen PHQ 2/9 Scores 04/18/2018 08/02/2016 07/19/2015 07/14/2014  PHQ - 2 Score 0 0 0 0    Cognitive Function MMSE - Mini Mental State Exam 07/19/2015 07/14/2014  Not completed: (No Data) Unable to complete       Ad8 score reviewed for issues:  Issues making decisions: no  Less interest in hobbies / activities: no  Repeats questions, stories (family complaining): no  Trouble using ordinary gadgets (microwave, computer, phone):no  Forgets the month or year: no  Mismanaging finances: no  Remembering appts: no  Daily problems with thinking and/or memory: no Ad8 score is= 0  Immunization History  Administered Date(s) Administered  . Influenza Split 12/20/2010, 12/13/2011  . Influenza Whole 12/06/2007, 01/05/2009, 12/06/2009  . Influenza, High Dose Seasonal PF 12/18/2013, 12/07/2014, 12/17/2015, 12/14/2017  . Influenza,inj,Quad PF,6+ Mos 01/01/2013  . Influenza-Unspecified 11/27/2016  . Pneumococcal Conjugate-13 07/14/2014  . Pneumococcal Polysaccharide-23 02/28/2013  . Tdap 06/06/2005, 04/04/2016  . Zoster 03/01/2011   Screening Tests Health Maintenance  Topic Date Due  . TETANUS/TDAP  04/04/2026  . INFLUENZA VACCINE  Completed  . PNA vac Low Risk Adult  Completed      Plan:     Reviewed health maintenance screenings with patient today and relevant education, vaccines, and/or referrals were provided.   Continue doing brain stimulating activities (puzzles, reading, adult coloring books, staying active) to keep memory sharp.   Continue to eat heart healthy diet (full of fruits, vegetables, whole grains, lean protein, water--limit salt, fat, and sugar intake) and increase physical activity as tolerated.   I have  personally reviewed and noted the following in  the patient's chart:   . Medical and social history . Use of alcohol, tobacco or illicit drugs  . Current medications and supplements . Functional ability and status . Nutritional status . Physical activity . Advanced directives . List of other physicians . Vitals . Screenings to include cognitive, depression, and falls . Referrals and appointments  In addition, I have reviewed and discussed with patient certain preventive protocols, quality metrics, and best practice recommendations. A written personalized care plan for preventive services as well as general preventive health recommendations were provided to patient.     Michiel Cowboy, RN  04/18/2018    Medical screening examination/treatment/procedure(s) were performed by non-physician practitioner and as supervising physician I was immediately available for consultation/collaboration. I agree with above. Binnie Rail, MD

## 2018-04-17 NOTE — Telephone Encounter (Signed)
error 

## 2018-04-18 ENCOUNTER — Ambulatory Visit (INDEPENDENT_AMBULATORY_CARE_PROVIDER_SITE_OTHER): Payer: Medicare Other | Admitting: *Deleted

## 2018-04-18 VITALS — BP 138/82 | HR 92 | Resp 17 | Ht 74.0 in | Wt 195.0 lb

## 2018-04-18 DIAGNOSIS — Z0000 Encounter for general adult medical examination without abnormal findings: Secondary | ICD-10-CM | POA: Diagnosis not present

## 2018-04-18 NOTE — Patient Instructions (Addendum)
Continue doing brain stimulating activities (puzzles, reading, adult coloring books, staying active) to keep memory sharp.   Continue to eat heart healthy diet (full of fruits, vegetables, whole grains, lean protein, water--limit salt, fat, and sugar intake) and increase physical activity as tolerated.   Ronald Stafford , Thank you for taking time to come for your Medicare Wellness Visit. I appreciate your ongoing commitment to your health goals. Please review the following plan we discussed and let me know if I can assist you in the future.   These are the goals we discussed: Goals      Patient Stated   . <enter goal here> (pt-stated)     To continue to enjoy life/ Discussed walking everyday would help increase HDL     . exercise (pt-stated)     Will have right foot checked so he can walk more;  Will also try to cut back on sodium       Other   . Patient Stated     Stay physically and socially active. Continue to eat healthy and exercise.       This is a list of the screening recommended for you and due dates:  Health Maintenance  Topic Date Due  . Tetanus Vaccine  04/04/2026  . Flu Shot  Completed  . Pneumonia vaccines  Completed     Health Maintenance, Male A healthy lifestyle and preventive care is important for your health and wellness. Ask your health care provider about what schedule of regular examinations is right for you. What should I know about weight and diet? Eat a Healthy Diet  Eat plenty of vegetables, fruits, whole grains, low-fat dairy products, and lean protein.  Do not eat a lot of foods high in solid fats, added sugars, or salt.  Maintain a Healthy Weight Regular exercise can help you achieve or maintain a healthy weight. You should:  Do at least 150 minutes of exercise each week. The exercise should increase your heart rate and make you sweat (moderate-intensity exercise).  Do strength-training exercises at least twice a week. Watch Your Levels of  Cholesterol and Blood Lipids  Have your blood tested for lipids and cholesterol every 5 years starting at 83 years of age. If you are at high risk for heart disease, you should start having your blood tested when you are 83 years old. You may need to have your cholesterol levels checked more often if: ? Your lipid or cholesterol levels are high. ? You are older than 83 years of age. ? You are at high risk for heart disease. What should I know about cancer screening? Many types of cancers can be detected early and may often be prevented. Lung Cancer  You should be screened every year for lung cancer if: ? You are a current smoker who has smoked for at least 30 years. ? You are a former smoker who has quit within the past 15 years.  Talk to your health care provider about your screening options, when you should start screening, and how often you should be screened. Colorectal Cancer  Routine colorectal cancer screening usually begins at 83 years of age and should be repeated every 5-10 years until you are 83 years old. You may need to be screened more often if early forms of precancerous polyps or small growths are found. Your health care provider may recommend screening at an earlier age if you have risk factors for colon cancer.  Your health care provider may recommend using  home test kits to check for hidden blood in the stool.  A small camera at the end of a tube can be used to examine your colon (sigmoidoscopy or colonoscopy). This checks for the earliest forms of colorectal cancer. Prostate and Testicular Cancer  Depending on your age and overall health, your health care provider may do certain tests to screen for prostate and testicular cancer.  Talk to your health care provider about any symptoms or concerns you have about testicular or prostate cancer. Skin Cancer  Check your skin from head to toe regularly.  Tell your health care provider about any new moles or changes in  moles, especially if: ? There is a change in a mole's size, shape, or color. ? You have a mole that is larger than a pencil eraser.  Always use sunscreen. Apply sunscreen liberally and repeat throughout the day.  Protect yourself by wearing long sleeves, pants, a wide-brimmed hat, and sunglasses when outside. What should I know about heart disease, diabetes, and high blood pressure?  If you are 13-60 years of age, have your blood pressure checked every 3-5 years. If you are 48 years of age or older, have your blood pressure checked every year. You should have your blood pressure measured twice-once when you are at a hospital or clinic, and once when you are not at a hospital or clinic. Record the average of the two measurements. To check your blood pressure when you are not at a hospital or clinic, you can use: ? An automated blood pressure machine at a pharmacy. ? A home blood pressure monitor.  Talk to your health care provider about your target blood pressure.  If you are between 72-29 years old, ask your health care provider if you should take aspirin to prevent heart disease.  Have regular diabetes screenings by checking your fasting blood sugar level. ? If you are at a normal weight and have a low risk for diabetes, have this test once every three years after the age of 58. ? If you are overweight and have a high risk for diabetes, consider being tested at a younger age or more often.  A one-time screening for abdominal aortic aneurysm (AAA) by ultrasound is recommended for men aged 27-75 years who are current or former smokers. What should I know about preventing infection? Hepatitis B If you have a higher risk for hepatitis B, you should be screened for this virus. Talk with your health care provider to find out if you are at risk for hepatitis B infection. Hepatitis C Blood testing is recommended for:  Everyone born from [birth year] through 1963.  Anyone with known risk factors for  hepatitis C. Sexually Transmitted Diseases (STDs)  You should be screened each year for STDs including gonorrhea and chlamydia if: ? You are sexually active and are younger than 83 years of age. ? You are older than 83 years of age and your health care provider tells you that you are at risk for this type of infection. ? Your sexual activity has changed since you were last screened and you are at an increased risk for chlamydia or gonorrhea. Ask your health care provider if you are at risk.  Talk with your health care provider about whether you are at high risk of being infected with HIV. Your health care provider may recommend a prescription medicine to help prevent HIV infection. What else can I do?  Schedule regular health, dental, and eye exams.  Stay current with  your vaccines (immunizations).  Do not use any tobacco products, such as cigarettes, chewing tobacco, and e-cigarettes. If you need help quitting, ask your health care provider.  Limit alcohol intake to no more than 2 drinks per day. One drink equals 12 ounces of beer, 5 ounces of wine, or 1 ounces of hard liquor.  Do not use street drugs.  Do not share needles.  Ask your health care provider for help if you need support or information about quitting drugs.  Tell your health care provider if you often feel depressed.  Tell your health care provider if you have ever been abused or do not feel safe at home. This information is not intended to replace advice given to you by your health care provider. Make sure you discuss any questions you have with your health care provider. Document Released: 08/26/2007 Document Revised: 10/27/2015 Document Reviewed: 12/01/2014 Elsevier Interactive Patient Education  2019 [employer].

## 2018-05-23 IMAGING — US US EXTREM UP*L* LTD
1 series · 6 of 6 positions shown · non-contrast
Comparison: Opposite wrist for comparison

CLINICAL DATA: Hereditary and idiopathic peripheral neuropathy.
Volar left wrist pain.

EXAM:
ULTRASOUND LEFT UPPER EXTREMITY LIMITED
TECHNIQUE: Ultrasound examination of the upper extremity soft tissues was
performed in the area of clinical concern.

[Series 1: us extrem up*left* ltd · 6 acquisitions, 6 frames shown]
[im 1/6]
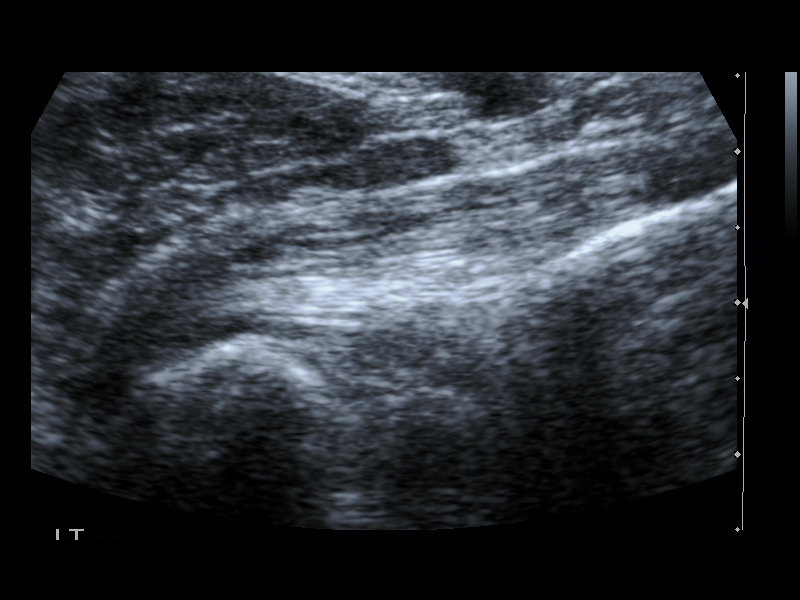
[im 2/6]
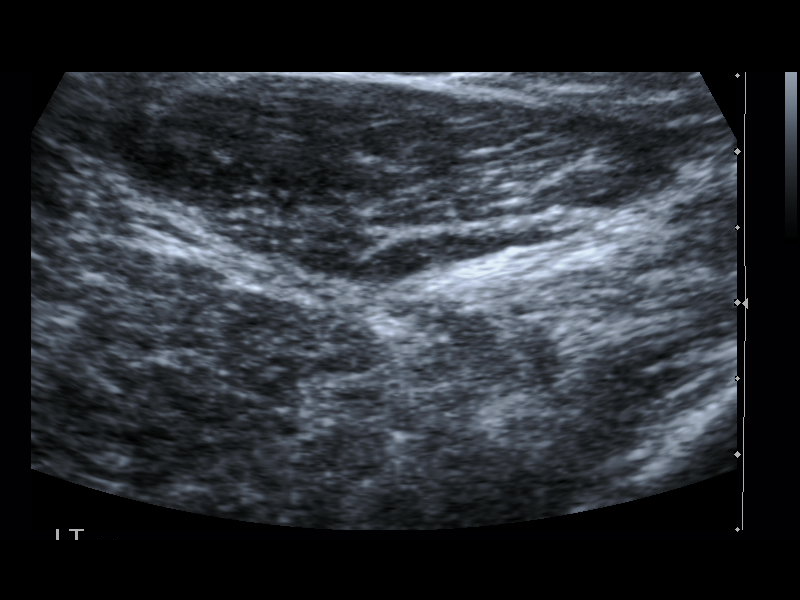
[im 3/6]
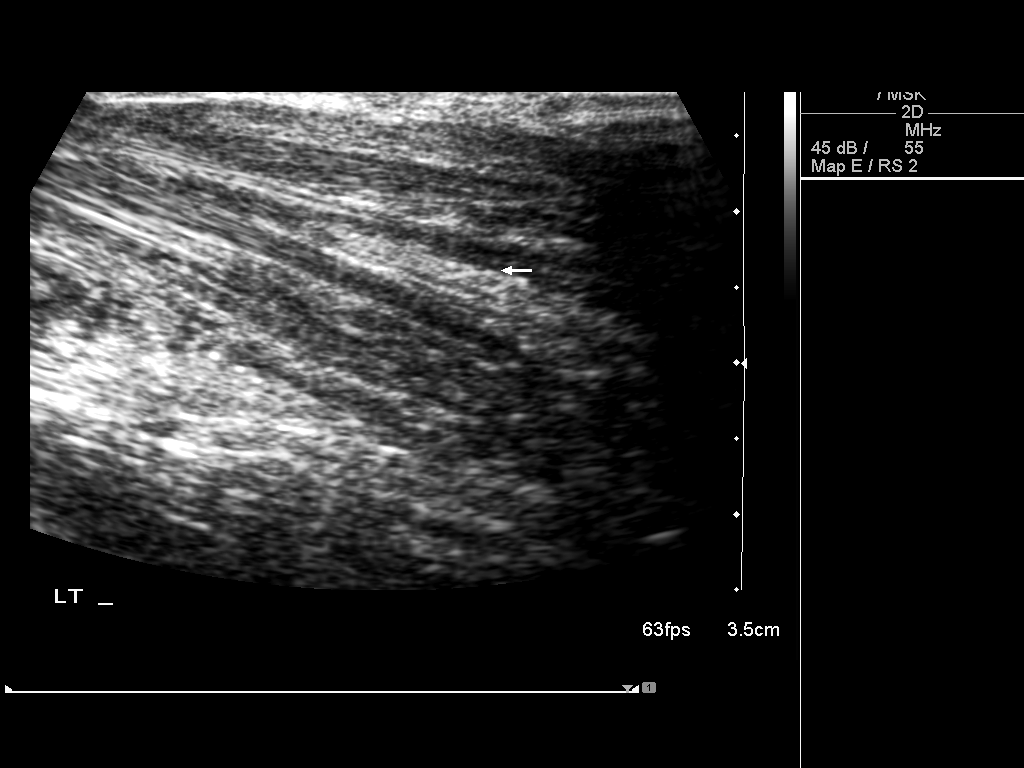
[im 4/6]
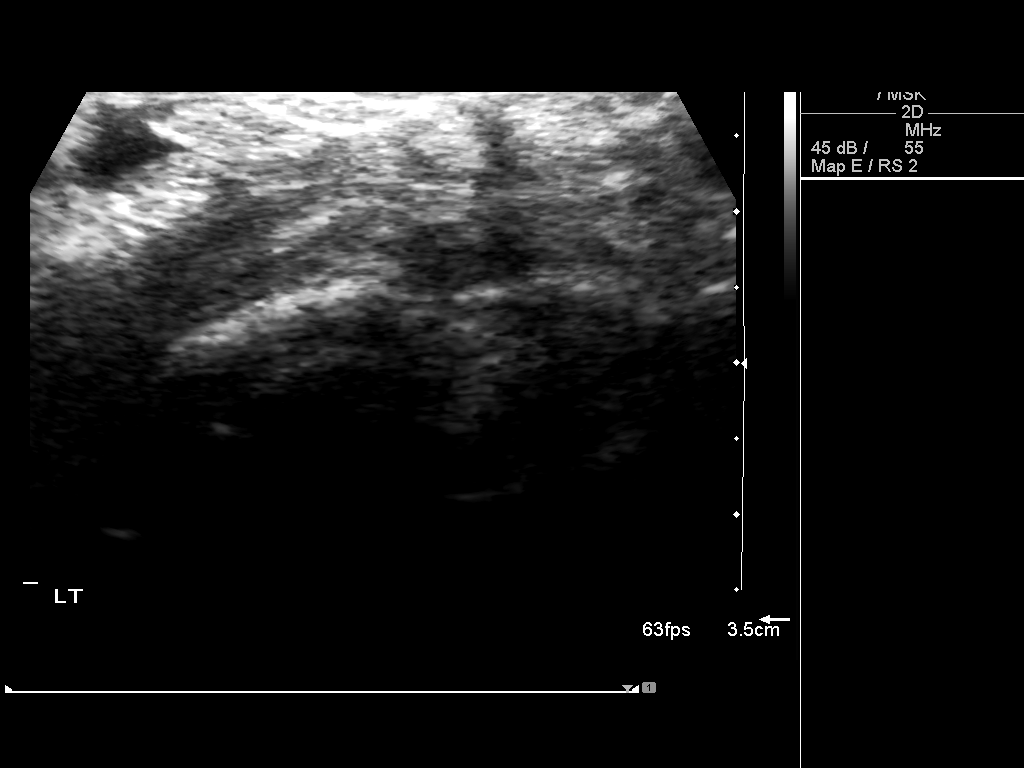
[im 5/6]
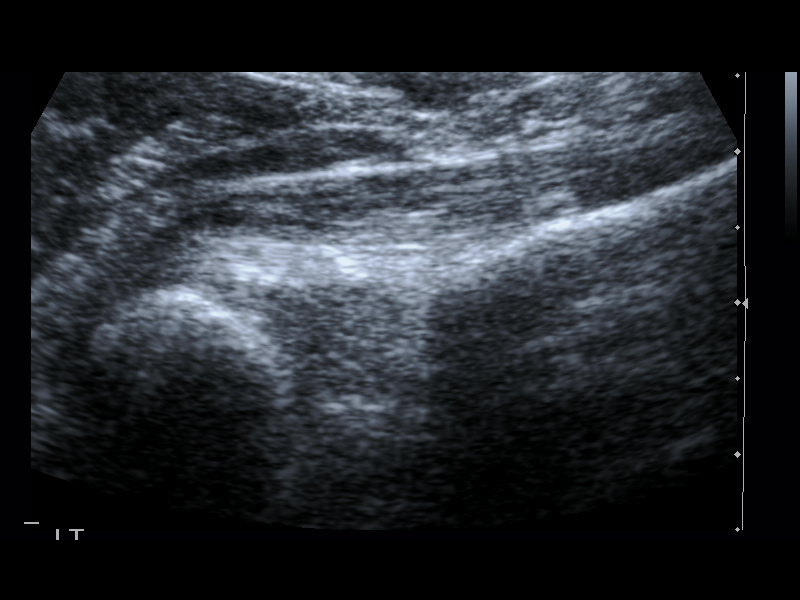
[im 6/6]
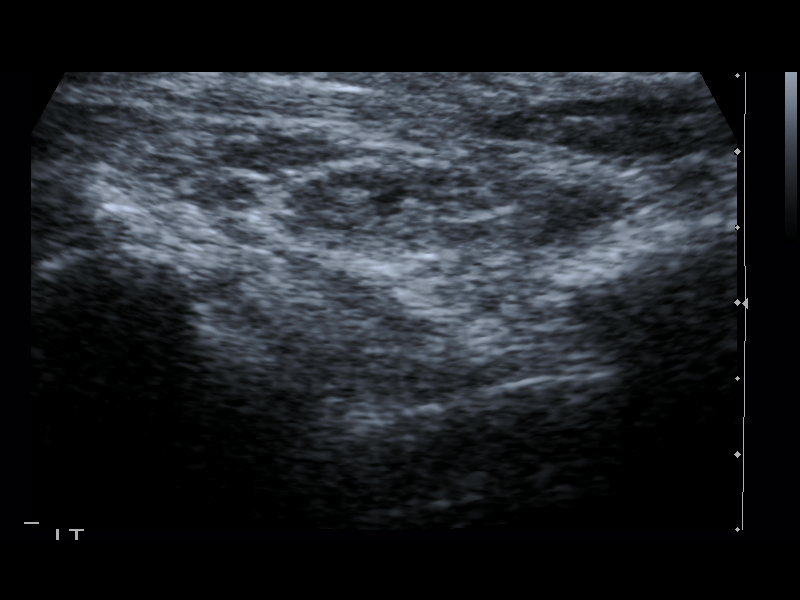

[6 of 6 positions shown; findings below may reference images not displayed]

FINDINGS: The course of the left median nerve was traced from the proximal
forearm into the carpal tunnel.

The nerve appears normal throughout the visualized course with no
evidence of a mass or entrapment. The appearance of the opposite
median nerve was essentially the same. No visible impingement within
the carpal tunnel.
IMPRESSION: Normal ultrasound of the left median nerve from the proximal forearm
into the carpal tunnel.

## 2018-06-04 ENCOUNTER — Other Ambulatory Visit: Payer: Self-pay

## 2018-06-04 ENCOUNTER — Ambulatory Visit (INDEPENDENT_AMBULATORY_CARE_PROVIDER_SITE_OTHER)
Admission: RE | Admit: 2018-06-04 | Discharge: 2018-06-04 | Disposition: A | Discharge status: Home / Self Care | Payer: Medicare Other | Source: Ambulatory Visit | Attending: Internal Medicine | Admitting: Internal Medicine

## 2018-06-04 ENCOUNTER — Ambulatory Visit (INDEPENDENT_AMBULATORY_CARE_PROVIDER_SITE_OTHER): Payer: Medicare Other | Admitting: Internal Medicine

## 2018-06-04 ENCOUNTER — Telehealth: Payer: Self-pay

## 2018-06-04 ENCOUNTER — Encounter: Payer: Self-pay | Admitting: Internal Medicine

## 2018-06-04 VITALS — BP 154/62 | HR 97 | Temp 98.0°F | Resp 18 | Ht 74.0 in | Wt 192.8 lb

## 2018-06-04 DIAGNOSIS — J069 Acute upper respiratory infection, unspecified: Secondary | ICD-10-CM

## 2018-06-04 DIAGNOSIS — R0789 Other chest pain: Secondary | ICD-10-CM

## 2018-06-04 DIAGNOSIS — R05 Cough: Secondary | ICD-10-CM | POA: Diagnosis not present

## 2018-06-04 DIAGNOSIS — R079 Chest pain, unspecified: Secondary | ICD-10-CM | POA: Diagnosis not present

## 2018-06-04 NOTE — Telephone Encounter (Signed)
Copied from [place] [id number]. Topic: Appointment Scheduling - Scheduling Inquiry for Clinic >> Jun 03, 2018  5:17 PM [name] wrote: Reason for CRM: Pt called in to come to see Dr. Quay Burow for left back shoulder pain that has been going on for two days but I noticed he had a cough and asked him if anything else was bothering him or if he had any cold symptoms and he stated he has a sore throat and a cough/ please advise how you'd want to schedule or see Pt.

## 2018-06-04 NOTE — Telephone Encounter (Signed)
I will see him 

## 2018-06-04 NOTE — Progress Notes (Signed)
.    Subjective:    Patient ID: Ronald Stafford, male    DOB: April 23, 1930, 85 y.o.   MRN: [id number]  HPI The patient is here for an acute visit.   Left side pain:  It started three days ago.  It hurts with deep breaths and moving.  He describes the pain as a stabbing sensation.[name] He was raking and moved the garbage cans out three days ago - there were no other potential causes.  He denies falls or injuries.  He denies changes in the skin.  He denies a burning or itching sensation.  Tylenol helps.  If he is completely still he does not have pain.  Cough;  He started having cough, hoarseness, sore throat, nasal congestion.  He does have a dry cough.  He denies any shortness of breath or wheezing.  He has not had any fevers or chills.  He denies ear pain or sinus pain.  He has not had any nausea, diarrhea, body aches or headaches.  He does get lightheadedness at times when he stands up quickly, but that is not new.  He has been taking Tylenol.   Medications and allergies reviewed with patient and updated if appropriate.  Patient Active Problem List   Diagnosis Date Noted  . Hematuria 11/01/2017  . Numbness and tingling of right hand 08/10/2017  . Skin abnormalities 06/13/2017  . Symptomatic anemia 02/22/2017  . Acute blood loss anemia 02/22/2017  . Failed total hip arthroplasty ([place]) 02/14/2017  . Osteoarthritis of left hip 12/10/2016  . Acute right ankle pain 11/07/2016  . Acute pain of left shoulder 10/04/2016  . Chest pain 07/30/2016  . History of DVT (deep vein thrombosis) 07/30/2016  . TIA (transient ischemic attack) 07/30/2016  . Thrombocytopenia ([place]) 07/30/2016  . Carotid arterial disease ([place]) 07/20/2016  . Right sided weakness 06/27/2016  . Blurry vision 06/27/2016  . Slurred speech 06/27/2016  . Cough 01/26/2016  . Left wrist pain 08/11/2015  . Frequent headaches 06/22/2015  . Prediabetes 02/11/2015  . Episodic paroxysmal hemicrania, not intractable 02/11/2015  . Peroneal  tendonitis of right lower extremity 01/12/2014  . Mass of right ankle 12/31/2013  . Right ankle instability 12/31/2013  . Radial head fracture, closed 09/18/2013  . Lateral epicondylitis of right elbow 09/16/2013  . Solitary pulmonary nodule 08/08/2012  . SPINAL STENOSIS, LUMBAR 04/06/2010  . LOW BACK PAIN SYNDROME 11/02/2009  . FOOT DROP, RIGHT 05/21/2008  . Other nonthrombocytopenic purpuras 10/21/2007  . Hyperlipidemia 12/25/2006  . Essential hypertension 12/25/2006  . GERD 12/25/2006  . PROSTATE CANCER, HX OF 12/25/2006  . POLYP, COLON 05/02/2006    Current Outpatient Medications on File Prior to Visit  Medication Sig Dispense Refill  . acetaminophen (TYLENOL) 325 MG tablet Take 650 mg by mouth every 6 (six) hours as needed for moderate pain or headache.     [name] amLODipine (NORVASC) 5 MG tablet TAKE 1 TABLET BY MOUTH ONCE DAILY 90 tablet 3  . atorvastatin (LIPITOR) 40 MG tablet TAKE 1 TABLET BY MOUTH ONCE DAILY 90 tablet 3  . clopidogrel (PLAVIX) 75 MG tablet TAKE 1 TABLET BY MOUTH ONCE DAILY 90 tablet 3  . gabapentin (NEURONTIN) 100 MG capsule TAKE 1 CAPSULE BY MOUTH ONCE DAILY AT BEDTIME 90 capsule 1  . Omega-3 Fatty Acids (FISH OIL) 500 MG CAPS Take by mouth.    . pantoprazole (PROTONIX) 40 MG tablet TAKE 1 TABLET BY MOUTH TWICE DAILY 60 tablet 0  . vitamin C (ASCORBIC ACID) 250  MG tablet Take 250 mg by mouth daily.     No current facility-administered medications on file prior to visit.     Past Medical History:  Diagnosis Date  . Cancer [facility]) 2004   prostate cancer, Dr. Risa Grill  . Colon polyps   . Deep venous thrombosis ([place]) 1997   Postop  . Depression   . GERD (gastroesophageal reflux disease)   . Hyperlipidemia   . Hypertension   . Shoulder fracture, left 02/23/2011   immobilized in sling , Dr Norris(no surgery)    Past Surgical History:  Procedure Laterality Date  . COLONOSCOPY  2006   Negative,Dr.Edwards  . colonoscopy with polypectomy      PMH of  .  ESOPHAGOGASTRODUODENOSCOPY N/A 02/23/2017   Procedure: ESOPHAGOGASTRODUODENOSCOPY (EGD);  Surgeon: Otis Brace, MD;  Location: [facility];  Service: Gastroenterology;  Laterality: N/A;  . JOINT REPLACEMENT  1997, 2001   Total Hip replacement X 2  . LUMBAR SPINE SURGERY  09/28/2010   Dr Rolena Infante; for spinal stenosis  . MECKEL DIVERTICULUM EXCISION     Age 34  . PROSTATECTOMY  2004   w/ radiation, Dr Risa Grill  . TOTAL HIP REVISION Left 02/14/2017   Procedure: Left hip polyethylene EXCHANGE;  Surgeon: Gaynelle Arabian, MD;  Location: [facility];  Service: Orthopedics;  Laterality: Left;  [name] VASECTOMY      Social History   Socioeconomic History  . Marital status: Widowed    Spouse name: Not on file  . Number of children: 2  . Years of education: Not on file  . Highest education level: Not on file  Occupational History  . Occupation: retired  Scientific laboratory technician  . Financial resource strain: Not hard at all  . Food insecurity:    Worry: Never true    Inability: Never true  . Transportation needs:    Medical: No    Non-medical: No  Tobacco Use  . Smoking status: Former Smoker    Packs/day: 0.50    Years: 5.00    Pack years: 2.50    Types: Cigarettes    Last attempt to quit: 03/13/1988    Years since quitting: 30.2  . Smokeless tobacco: Former Systems developer  . Tobacco comment: Quit at about age 9 / chewed tobacca x 1 year  Substance and Sexual Activity  . Alcohol use: No    Alcohol/week: 0.0 standard drinks  . Drug use: No  . Sexual activity: Never  Lifestyle  . Physical activity:    Days per week: 4 days    Minutes per session: 50 min  . Stress: Not at all  Relationships  . Social connections:    Talks on phone: More than three times a week    Gets together: More than three times a week    Attends religious service: More than 4 times per year    Active member of club or organization: Yes    Attends meetings of clubs or organizations: More than 4 times per year    Relationship status:  Widowed  Other Topics Concern  . Not on file  Social History Narrative   Lives alone; Wife passed December 25, 2012;   Died of ovarian cancer; breast cancer; other cancer;       Family History  Problem Relation Age of Onset  . Prostate cancer Father   . Testicular cancer Son     Review of Systems  Constitutional: Negative for chills and fever.  HENT: Positive for congestion, sore throat and voice change. Negative  for ear pain, sinus pressure and sinus pain.   Respiratory: Positive for cough (dry). Negative for shortness of breath and wheezing.   Cardiovascular: Positive for chest pain (with movement or deep breathes).  Gastrointestinal: Negative for diarrhea and nausea.  Musculoskeletal: Negative for myalgias.  Skin: Negative for rash.  Neurological: Positive for light-headedness. Negative for headaches.       Objective:   Vitals:   06/04/18 1331  BP: (!) 154/62  Pulse: 97  Resp: 18  Temp: 98 F (36.7 C)  SpO2: 99%   BP Readings from Last 3 Encounters:  06/04/18 (!) 154/62  04/18/18 138/82  01/21/18 (!) 158/78   Wt Readings from Last 3 Encounters:  06/04/18 192 lb 12.8 oz (87.5 kg)  04/18/18 195 lb (88.5 kg)  01/21/18 190 lb 1.3 oz (86.2 kg)   Body mass index is 24.75 kg/m.   Physical Exam    GENERAL APPEARANCE: Appears stated age, well appearing, NAD EYES: conjunctiva clear, no icterus HEENT: bilateral tympanic membranes and ear canals normal, oropharynx with no erythema, no sinus tenderness, no thyromegaly, trachea midline, no cervical or supraclavicular lymphadenopathy LUNGS: Clear to auscultation without wheeze or crackles, unlabored breathing, good air entry bilaterally CARDIOVASCULAR: Normal S1,S2 without murmurs, no edema CHEST/RIBS: No pain with palpation along left side of chest/thorax Musculoskeletal: No thoracic spine tenderness Abdomen: No abdominal tenderness, soft, nondistended SKIN: Warm, dry, no rash      Assessment & Plan:    See  Problem List for Assessment and Plan of chronic medical problems.

## 2018-06-04 NOTE — Telephone Encounter (Signed)
Appointment made

## 2018-06-04 NOTE — Assessment & Plan Note (Signed)
Pain left side of chest that only occurs with movement or deep breaths.  No pain at rest Stabbing sensation, no burning/itching or rash Pleuritic or musculoskeletal in nature Unlikely shingles since there is no rash or burning/itching, but really shingles is a possibility.  He will monitor closely for rash Chest x-ray given dry cough, cold symptoms to rule out early pneumonia  Tylenol is controlling his pain and he will continue He will call if symptoms worsen/do not improve/change

## 2018-06-04 NOTE — Patient Instructions (Addendum)
Have a chest x-ray today.  We will call you with the results.    Your left sided chest pain is likely musculoskeletal.  It may also be early shingles. Monitor closely for a rash and if one develops call me immediately.  Continue taking Tylenol.  If your pain is not controlled or gets worse let me know.    Your cough and sore throat is likely a mild viral infection.  Treat your symptoms with over the counter medications.  Call if there is no improvement or any concerns.

## 2018-06-04 NOTE — Assessment & Plan Note (Signed)
He has a dry cough, sore throat, nasal congestion, hoarseness Likely viral in nature Allergies not likely since he has no history Possible atypical pneumonia given chest pain No fever so unlikely flu or cold. Chest x-ray Continue Tylenol Over-the-counter cold medication for symptom relief Call if symptoms worsen/do not improve

## 2018-08-10 ENCOUNTER — Other Ambulatory Visit: Payer: Self-pay | Admitting: Internal Medicine

## 2018-08-26 ENCOUNTER — Other Ambulatory Visit: Payer: Self-pay | Admitting: Internal Medicine

## 2018-09-25 ENCOUNTER — Encounter (HOSPITAL_COMMUNITY): Payer: Self-pay | Admitting: Emergency Medicine

## 2018-09-25 ENCOUNTER — Other Ambulatory Visit: Payer: Self-pay

## 2018-09-25 ENCOUNTER — Ambulatory Visit (HOSPITAL_COMMUNITY)
Admission: EM | Admit: 2018-09-25 | Discharge: 2018-09-25 | Disposition: A | Discharge status: Home / Self Care | Payer: Medicare Other | Attending: Family Medicine | Admitting: Family Medicine

## 2018-09-25 DIAGNOSIS — S51002A Unspecified open wound of left elbow, initial encounter: Secondary | ICD-10-CM

## 2018-09-25 NOTE — ED Triage Notes (Signed)
PT fell and has a laceration to left upper arm. Happened today. PT stood up from a sitting position, got dizzy, and tripped over something. Denies head injury. Denies LOC.

## 2018-09-28 NOTE — ED Provider Notes (Signed)
[facility]   [phone] 09/25/18 Arrival Time: 1191  ASSESSMENT & PLAN:  1. Avulsion of skin of elbow, left, initial encounter     No indication for L elbow imaging at this time. Wound cleaned. No difficulty controlling bleeding/oozing. No indication for suturing. Non-stick dressing placed by RN. Simple wound care discussed.  Will f/u with PCP for BP recheck. No change in management today.  Reviewed expectations re: course of current medical issues. Questions answered. Outlined signs and symptoms indicating need for more acute intervention. Patient verbalized understanding. After Visit Summary given.   SUBJECTIVE:  Ronald Stafford is a 85 y.o. male who presents with a skin avulsion over his L elbow. Today. Transient single episode of lightheadedness from sitting to standing. 'Tripped and scraped my elbow'. Some bleeding/oozing. Cleaned and wrapped at home. No head injury. No further lightheadedness.  Increased blood pressure noted today. Reports that he is treated for hypertension; taking medications.  He reports no chest pain on exertion, no dyspnea on exertion, no swelling of ankles, no orthopnea or paroxysmal nocturnal dyspnea, no palpitations and no intermittent claudication symptoms.  Td UTD: Yes.  ROS: As per HPI. All other systems negative.   OBJECTIVE:  Vitals:   09/25/18 1739  BP: (!) 182/75  Pulse: 74  Resp: 16  Temp: 98.5 F (36.9 C)  TempSrc: Oral  SpO2: 98%     General appearance: alert; no distress HEENT: [place]; AT; PERRLA; EOMI Neck: supple; FROM CV: RRR Lungs: unlabored respirations Skin: two small skin avulsions over L lateral elbow; each approx 1 cm; clean wound edges, no foreign bodies; with mild oozing of blood; L elbow with FROM; no bony tenderness to palpation Ext: no edema Neuro: normal gait; normal extremity strength and sensation throughout Psychological: alert and cooperative; normal mood and affect    Allergies  Allergen  Reactions  . Chicken Allergy Nausea And Vomiting and Other (See Comments)    Digestive problems    Past Medical History:  Diagnosis Date  . Cancer [facility]) 2004   prostate cancer, Dr. Risa Grill  . Colon polyps   . Deep venous thrombosis ([place]) 1997   Postop  . Depression   . GERD (gastroesophageal reflux disease)   . Hyperlipidemia   . Hypertension   . Shoulder fracture, left 02/23/2011   immobilized in sling , Dr Norris(no surgery)   Social History   Socioeconomic History  . Marital status: Widowed    Spouse name: Not on file  . Number of children: 2  . Years of education: Not on file  . Highest education level: Not on file  Occupational History  . Occupation: retired  Scientific laboratory technician  . Financial resource strain: Not hard at all  . Food insecurity    Worry: Never true    Inability: Never true  . Transportation needs    Medical: No    Non-medical: No  Tobacco Use  . Smoking status: Former Smoker    Packs/day: 0.50    Years: 5.00    Pack years: 2.50    Types: Cigarettes    Quit date: 03/13/1988    Years since quitting: 30.5  . Smokeless tobacco: Former Systems developer  . Tobacco comment: Quit at about age 77 / chewed tobacca x 1 year  Substance and Sexual Activity  . Alcohol use: No    Alcohol/week: 0.0 standard drinks  . Drug use: No  . Sexual activity: Never  Lifestyle  . Physical activity    Days per week: 4 days  Minutes per session: 50 min  . Stress: Not at all  Relationships  . Social connections    Talks on phone: More than three times a week    Gets together: More than three times a week    Attends religious service: More than 4 times per year    Active member of club or organization: Yes    Attends meetings of clubs or organizations: More than 4 times per year    Relationship status: Widowed  Other Topics Concern  . Not on file  Social History Narrative   Lives alone; Wife passed December 02, 2012;   Died of ovarian cancer; breast cancer; other cancer;             Vanessa Kick, MD 09/30/18 [phone]

## 2018-09-29 NOTE — Progress Notes (Signed)
Subjective:    Patient ID: Ronald Stafford, male    DOB: 03-Mar-1931, 85 y.o.   MRN: [id number]  HPI He is here for a physical exam.   He fell last week - tripped while walking in the yard.  He injured his left arm and has a few bruises.  He did go to urgent care and there were no major injuries.  He has his left arm wrapped and would like me to look at that.  Left shoulder pain:  He has been having pain in his left shoulder at night for a while.  He wanted to have an x-ray today.  He is eating less and has lost some weight.  He is very active. He is not concerned because he is eating less and the weight loss makes sense and is not surprising.    Medications and allergies reviewed with patient and updated if appropriate.  Patient Active Problem List   Diagnosis Date Noted  . Left-sided chest wall pain 06/04/2018  . Hematuria 11/01/2017  . Numbness and tingling of right hand 08/10/2017  . Symptomatic anemia 02/22/2017  . Acute blood loss anemia 02/22/2017  . Failed total hip arthroplasty ([place]) 02/14/2017  . Osteoarthritis of left hip 12/10/2016  . Acute right ankle pain 11/07/2016  . Acute pain of left shoulder 10/04/2016  . Chest pain 07/30/2016  . History of DVT (deep vein thrombosis) 07/30/2016  . TIA (transient ischemic attack) 07/30/2016  . Thrombocytopenia ([place]) 07/30/2016  . Carotid arterial disease ([place]) 07/20/2016  . Right sided weakness 06/27/2016  . Slurred speech 06/27/2016  . Cough 01/26/2016  . Left wrist pain 08/11/2015  . Frequent headaches 06/22/2015  . Prediabetes 02/11/2015  . Episodic paroxysmal hemicrania, not intractable 02/11/2015  . Peroneal tendonitis of right lower extremity 01/12/2014  . Mass of right ankle 12/31/2013  . Right ankle instability 12/31/2013  . Radial head fracture, closed 09/18/2013  . Solitary pulmonary nodule 08/08/2012  . SPINAL STENOSIS, LUMBAR 04/06/2010  . LOW BACK PAIN SYNDROME 11/02/2009  . FOOT DROP, RIGHT 05/21/2008  .  Hyperlipidemia 12/25/2006  . Essential hypertension 12/25/2006  . GERD 12/25/2006  . PROSTATE CANCER, HX OF 12/25/2006  . POLYP, COLON 05/02/2006    Current Outpatient Medications on File Prior to Visit  Medication Sig Dispense Refill  . acetaminophen (TYLENOL) 325 MG tablet Take 650 mg by mouth every 6 (six) hours as needed for moderate pain or headache.     [name] amLODipine (NORVASC) 5 MG tablet Take 1 tablet (5 mg total) by mouth daily. Need office visit for more refills. 30 tablet 0  . atorvastatin (LIPITOR) 40 MG tablet TAKE 1 TABLET BY MOUTH ONCE DAILY 90 tablet 3  . clopidogrel (PLAVIX) 75 MG tablet Take 1 tablet (75 mg total) by mouth daily. Need office visit for more refills. 30 tablet 0  . gabapentin (NEURONTIN) 100 MG capsule TAKE 1 CAPSULE BY MOUTH ONCE DAILY AT BEDTIME 90 capsule 1  . Omega-3 Fatty Acids (FISH OIL) 500 MG CAPS Take by mouth.    . pantoprazole (PROTONIX) 40 MG tablet TAKE 1 TABLET BY MOUTH TWICE DAILY 60 tablet 0  . vitamin C (ASCORBIC ACID) 250 MG tablet Take 250 mg by mouth daily.     No current facility-administered medications on file prior to visit.     Past Medical History:  Diagnosis Date  . Cancer [facility]) 2004   prostate cancer, Dr. Risa Grill  . Colon polyps   . Deep venous thrombosis (  [place]) 1997   Postop  . Depression   . GERD (gastroesophageal reflux disease)   . Hyperlipidemia   . Hypertension   . Shoulder fracture, left 02/23/2011   immobilized in sling , Dr Norris(no surgery)    Past Surgical History:  Procedure Laterality Date  . COLONOSCOPY  2006   Negative,Dr.Edwards  . colonoscopy with polypectomy      PMH of  . ESOPHAGOGASTRODUODENOSCOPY N/A 02/23/2017   Procedure: ESOPHAGOGASTRODUODENOSCOPY (EGD);  Surgeon: Otis Brace, MD;  Location: [facility];  Service: Gastroenterology;  Laterality: N/A;  . JOINT REPLACEMENT  1997, 2001   Total Hip replacement X 2  . LUMBAR SPINE SURGERY  09/28/2010   Dr Rolena Infante; for spinal stenosis  .  MECKEL DIVERTICULUM EXCISION     Age 55  . PROSTATECTOMY  2004   w/ radiation, Dr Risa Grill  . TOTAL HIP REVISION Left 02/14/2017   Procedure: Left hip polyethylene EXCHANGE;  Surgeon: Gaynelle Arabian, MD;  Location: [facility];  Service: Orthopedics;  Laterality: Left;  [name] VASECTOMY      Social History   Socioeconomic History  . Marital status: Widowed    Spouse name: Not on file  . Number of children: 2  . Years of education: Not on file  . Highest education level: Not on file  Occupational History  . Occupation: retired  Scientific laboratory technician  . Financial resource strain: Not hard at all  . Food insecurity    Worry: Never true    Inability: Never true  . Transportation needs    Medical: No    Non-medical: No  Tobacco Use  . Smoking status: Former Smoker    Packs/day: 0.50    Years: 5.00    Pack years: 2.50    Types: Cigarettes    Quit date: 03/13/1988    Years since quitting: 30.5  . Smokeless tobacco: Former Systems developer  . Tobacco comment: Quit at about age 85 / chewed tobacca x 1 year  Substance and Sexual Activity  . Alcohol use: No    Alcohol/week: 0.0 standard drinks  . Drug use: No  . Sexual activity: Never  Lifestyle  . Physical activity    Days per week: 4 days    Minutes per session: 50 min  . Stress: Not at all  Relationships  . Social connections    Talks on phone: More than three times a week    Gets together: More than three times a week    Attends religious service: More than 4 times per year    Active member of club or organization: Yes    Attends meetings of clubs or organizations: More than 4 times per year    Relationship status: Widowed  Other Topics Concern  . Not on file  Social History Narrative   Lives alone; Wife passed 12/07/12;   Died of ovarian cancer; breast cancer; other cancer;       Family History  Problem Relation Age of Onset  . Prostate cancer Father   . Testicular cancer Son     Review of Systems  Constitutional: Negative for  chills and fever.  Respiratory: Negative for cough, shortness of breath and wheezing.   Cardiovascular: Negative for chest pain, palpitations and leg swelling.  Gastrointestinal: Negative for abdominal pain, blood in stool, constipation, diarrhea and nausea.       Gerd controlled  Genitourinary: Negative for dysuria and hematuria.  Musculoskeletal: Positive for arthralgias and back pain.  Skin: Negative for color change and rash.  Neurological: Positive for light-headedness (a little at times). Negative for headaches.  Psychiatric/Behavioral: Negative for dysphoric mood. The patient is not nervous/anxious.        Objective:   Vitals:   09/30/18 0750  BP: (!) 170/86  Pulse: 87  Resp: 16  Temp: 98.3 F (36.8 C)  SpO2: 98%   Filed Weights   09/30/18 0750  Weight: 186 lb (84.4 kg)   Body mass index is 23.88 kg/m.   BP Readings from Last 3 Encounters:  09/30/18 (!) 170/86  09/25/18 (!) 182/75  06/04/18 (!) 154/62    Wt Readings from Last 3 Encounters:  09/30/18 186 lb (84.4 kg)  06/04/18 192 lb 12.8 oz (87.5 kg)  04/18/18 195 lb (88.5 kg)     Physical Exam Constitutional: He appears well-developed and well-nourished. No distress.  HENT:  Head: Normocephalic and atraumatic.  Right Ear: External ear normal.  Left Ear: External ear normal.  Mouth/Throat: Oropharynx is clear and moist.  Normal ear canals and TM b/l  Eyes: Conjunctivae and EOM are normal.  Neck: Neck supple. No tracheal deviation present. No thyromegaly present.  No carotid bruit  Cardiovascular: Normal rate, regular rhythm, normal heart sounds and intact distal pulses.   No murmur heard. Pulmonary/Chest: Effort normal and breath sounds normal. No respiratory distress. He has no wheezes. He has no rales.  Abdominal: Soft. He exhibits no distension. There is no tenderness.  Genitourinary: deferred  Musculoskeletal: He exhibits no edema.  Lymphadenopathy:   He has no cervical adenopathy.  Skin: Skin  is warm and dry. He is not diaphoretic.  Left arm proximal to elbow significant bruising, mild swelling and a small laceration that has closed he appears to be healing normally.  No open wound or discharge. Psychiatric: He has a normal mood and affect. His behavior is normal.         Assessment & Plan:   Physical exam: Screening blood work  ordered Immunizations  Discussed shingrix, others up to date Colonoscopy  N/A due to age Eye exams  Up to date  Exercise   Very active Weight  Normal BMI Skin    No concerns Substance abuse   none  See Problem List for Assessment and Plan of chronic medical problems.   Follow-up in 1 month to recheck blood pressure-amlodipine increased.  Also needs get his blood work done because he did not get that done today

## 2018-09-29 NOTE — Patient Instructions (Addendum)
Tests ordered today. Your results will be released to [place] (or called to you) after review.  If any changes need to be made, you will be notified at that same time.  All other Health Maintenance issues reviewed.   All recommended immunizations and age-appropriate screenings are up-to-date or discussed.  No immunization administered today.   Medications reviewed and updated.  Changes include :   Increase amlodipine to 10 mg daily  Your prescription(s) have been submitted to your pharmacy. Please take as directed and contact our office if you believe you are having problem(s) with the medication(s).   Please followup in 1 month    Health Maintenance, Male Adopting a healthy lifestyle and getting preventive care are important in promoting health and wellness. Ask your health care provider about:  The right schedule for you to have regular tests and exams.  Things you can do on your own to prevent diseases and keep yourself healthy. What should I know about diet, weight, and exercise? Eat a healthy diet   Eat a diet that includes plenty of vegetables, fruits, low-fat dairy products, and lean protein.  Do not eat a lot of foods that are high in solid fats, added sugars, or sodium. Maintain a healthy weight Body mass index (BMI) is a measurement that can be used to identify possible weight problems. It estimates body fat based on height and weight. Your health care provider can help determine your BMI and help you achieve or maintain a healthy weight. Get regular exercise Get regular exercise. This is one of the most important things you can do for your health. Most adults should:  Exercise for at least 150 minutes each week. The exercise should increase your heart rate and make you sweat (moderate-intensity exercise).  Do strengthening exercises at least twice a week. This is in addition to the moderate-intensity exercise.  Spend less time sitting. Even light physical activity  can be beneficial. Watch cholesterol and blood lipids Have your blood tested for lipids and cholesterol at 83 years of age, then have this test every 5 years. You may need to have your cholesterol levels checked more often if:  Your lipid or cholesterol levels are high.  You are older than 83 years of age.  You are at high risk for heart disease. What should I know about cancer screening? Many types of cancers can be detected early and may often be prevented. Depending on your health history and family history, you may need to have cancer screening at various ages. This may include screening for:  Colorectal cancer.  Prostate cancer.  Skin cancer.  Lung cancer. What should I know about heart disease, diabetes, and high blood pressure? Blood pressure and heart disease  High blood pressure causes heart disease and increases the risk of stroke. This is more likely to develop in people who have high blood pressure readings, are of African descent, or are overweight.  Talk with your health care provider about your target blood pressure readings.  Have your blood pressure checked: ? Every 3-5 years if you are 55-66 years of age. ? Every year if you are 29 years old or older.  If you are between the ages of 61 and 63 and are a current or former smoker, ask your health care provider if you should have a one-time screening for abdominal aortic aneurysm (AAA). Diabetes Have regular diabetes screenings. This checks your fasting blood sugar level. Have the screening done:  Once every three years after age  45 if you are at a normal weight and have a low risk for diabetes.  More often and at a younger age if you are overweight or have a high risk for diabetes. What should I know about preventing infection? Hepatitis B If you have a higher risk for hepatitis B, you should be screened for this virus. Talk with your health care provider to find out if you are at risk for hepatitis B infection.  Hepatitis C Blood testing is recommended for:  Everyone born from [birth year] through 1963.  Anyone with known risk factors for hepatitis C. Sexually transmitted infections (STIs)  You should be screened each year for STIs, including gonorrhea and chlamydia, if: ? You are sexually active and are younger than 83 years of age. ? You are older than 83 years of age and your health care provider tells you that you are at risk for this type of infection. ? Your sexual activity has changed since you were last screened, and you are at increased risk for chlamydia or gonorrhea. Ask your health care provider if you are at risk.  Ask your health care provider about whether you are at high risk for HIV. Your health care provider may recommend a prescription medicine to help prevent HIV infection. If you choose to take medicine to prevent HIV, you should first get tested for HIV. You should then be tested every 3 months for as long as you are taking the medicine. Follow these instructions at home: Lifestyle  Do not use any products that contain nicotine or tobacco, such as cigarettes, e-cigarettes, and chewing tobacco. If you need help quitting, ask your health care provider.  Do not use street drugs.  Do not share needles.  Ask your health care provider for help if you need support or information about quitting drugs. Alcohol use  Do not drink alcohol if your health care provider tells you not to drink.  If you drink alcohol: ? Limit how much you have to 0-2 drinks a day. ? Be aware of how much alcohol is in your drink. In the [place] one drink equals one 12 oz bottle of beer (355 mL), one 5 oz glass of wine (148 mL), or one 1 oz glass of hard liquor (44 mL). General instructions  Schedule regular health, dental, and eye exams.  Stay current with your vaccines.  Tell your health care provider if: ? You often feel depressed. ? You have ever been abused or do not feel safe at home. Summary   Adopting a healthy lifestyle and getting preventive care are important in promoting health and wellness.  Follow your health care provider's instructions about healthy diet, exercising, and getting tested or screened for diseases.  Follow your health care provider's instructions on monitoring your cholesterol and blood pressure. This information is not intended to replace advice given to you by your health care provider. Make sure you discuss any questions you have with your health care provider. Document Released: 08/26/2007 Document Revised: 02/20/2018 Document Reviewed: 02/20/2018 Elsevier Patient Education  2020 [employer].

## 2018-09-30 ENCOUNTER — Encounter: Payer: Self-pay | Admitting: Internal Medicine

## 2018-09-30 ENCOUNTER — Other Ambulatory Visit: Payer: Self-pay

## 2018-09-30 ENCOUNTER — Ambulatory Visit (INDEPENDENT_AMBULATORY_CARE_PROVIDER_SITE_OTHER): Payer: Medicare Other | Admitting: Internal Medicine

## 2018-09-30 ENCOUNTER — Ambulatory Visit (INDEPENDENT_AMBULATORY_CARE_PROVIDER_SITE_OTHER)
Admission: RE | Admit: 2018-09-30 | Discharge: 2018-09-30 | Disposition: A | Discharge status: Home / Self Care | Payer: Medicare Other | Source: Ambulatory Visit | Attending: Internal Medicine | Admitting: Internal Medicine

## 2018-09-30 VITALS — BP 170/86 | HR 87 | Temp 98.3°F | Resp 16 | Ht 74.0 in | Wt 186.0 lb

## 2018-09-30 DIAGNOSIS — K219 Gastro-esophageal reflux disease without esophagitis: Secondary | ICD-10-CM

## 2018-09-30 DIAGNOSIS — Z0000 Encounter for general adult medical examination without abnormal findings: Secondary | ICD-10-CM

## 2018-09-30 DIAGNOSIS — M25512 Pain in left shoulder: Secondary | ICD-10-CM

## 2018-09-30 DIAGNOSIS — G8929 Other chronic pain: Secondary | ICD-10-CM

## 2018-09-30 DIAGNOSIS — R7303 Prediabetes: Secondary | ICD-10-CM

## 2018-09-30 DIAGNOSIS — Z8546 Personal history of malignant neoplasm of prostate: Secondary | ICD-10-CM

## 2018-09-30 DIAGNOSIS — E7849 Other hyperlipidemia: Secondary | ICD-10-CM | POA: Diagnosis not present

## 2018-09-30 DIAGNOSIS — R519 Headache, unspecified: Secondary | ICD-10-CM

## 2018-09-30 DIAGNOSIS — I10 Essential (primary) hypertension: Secondary | ICD-10-CM | POA: Diagnosis not present

## 2018-09-30 DIAGNOSIS — S4992XA Unspecified injury of left shoulder and upper arm, initial encounter: Secondary | ICD-10-CM | POA: Diagnosis not present

## 2018-09-30 DIAGNOSIS — R51 Headache: Secondary | ICD-10-CM

## 2018-09-30 MED ORDER — GABAPENTIN 100 MG PO CAPS: ORAL_CAPSULE | ORAL | 1 refills | Status: DC

## 2018-09-30 MED ORDER — AMLODIPINE BESYLATE 10 MG PO TABS: 10.0000 mg | ORAL_TABLET | Freq: Every day | ORAL | 1 refills | Status: DC

## 2018-09-30 MED ORDER — CLOPIDOGREL BISULFATE 75 MG PO TABS: 75.0000 mg | ORAL_TABLET | Freq: Every day | ORAL | 1 refills | Status: DC

## 2018-09-30 MED ORDER — AMLODIPINE BESYLATE 5 MG PO TABS: 5.0000 mg | ORAL_TABLET | Freq: Every day | ORAL | 1 refills | Status: DC

## 2018-09-30 NOTE — Assessment & Plan Note (Signed)
GERD controlled Continue daily medication  

## 2018-09-30 NOTE — Assessment & Plan Note (Signed)
Blood pressure does not appear to be well controlled Will increase amlodipine to 10 mg daily-advised for him to monitor for lower extremity edema Follow-up in 1 month CMP

## 2018-09-30 NOTE — Assessment & Plan Note (Addendum)
Check lipid panel,cmp ,tsh Continue daily statin Regular exercise and healthy diet encouraged  

## 2018-09-30 NOTE — Assessment & Plan Note (Signed)
Check a1c Low sugar / carb diet Stressed regular exercise   

## 2018-09-30 NOTE — Assessment & Plan Note (Signed)
Takes gabapentin at night Effective Will continue

## 2018-09-30 NOTE — Assessment & Plan Note (Signed)
psa

## 2018-10-28 ENCOUNTER — Other Ambulatory Visit: Payer: Self-pay | Admitting: Internal Medicine

## 2018-10-30 NOTE — Progress Notes (Signed)
Subjective:    Patient ID: Ronald Stafford, male    DOB: April 20, 1930, 85 y.o.   MRN: [id number]  HPI The patient is here for follow up of his BP.  We increased his amlodipine one month ago.    Hypertension: He is taking his medication daily. He is compliant with a low sodium diet.  He gets some swelling in his legs from working outside and being active - there has been on changes.  He denies chest pain, palpitations, shortness of breath and regular headaches.  He does not monitor his blood pressure at home - his cuff is broken.    H/o prostate cancer and has urinary leakage.   He wears a pad.  He wonders about medication or something that may help.  He is no longer following with urology.     Medications and allergies reviewed with patient and updated if appropriate.  Patient Active Problem List   Diagnosis Date Noted  . Left-sided chest wall pain 06/04/2018  . Numbness and tingling of right hand 08/10/2017  . History of total hip arthroplasty 03/28/2017  . Symptomatic anemia 02/22/2017  . Failed total hip arthroplasty ([place]) 02/14/2017  . Osteoarthritis of left hip 12/10/2016  . Acute right ankle pain 11/07/2016  . Acute pain of left shoulder 10/04/2016  . Chest pain 07/30/2016  . History of DVT (deep vein thrombosis) 07/30/2016  . TIA (transient ischemic attack) 07/30/2016  . Thrombocytopenia ([place]) 07/30/2016  . Carotid arterial disease ([place]) 07/20/2016  . Right sided weakness 06/27/2016  . Slurred speech 06/27/2016  . Left wrist pain 08/11/2015  . Frequent headaches 06/22/2015  . Prediabetes 02/11/2015  . Peroneal tendonitis of right lower extremity 01/12/2014  . Mass of right ankle 12/31/2013  . Right ankle instability 12/31/2013  . Radial head fracture, closed 09/18/2013  . Solitary pulmonary nodule 08/08/2012  . SPINAL STENOSIS, LUMBAR 04/06/2010  . LOW BACK PAIN SYNDROME 11/02/2009  . FOOT DROP, RIGHT 05/21/2008  . Hyperlipidemia 12/25/2006  . Essential hypertension  12/25/2006  . GERD 12/25/2006  . PROSTATE CANCER, HX OF 12/25/2006  . POLYP, COLON 05/02/2006    Current Outpatient Medications on File Prior to Visit  Medication Sig Dispense Refill  . acetaminophen (TYLENOL) 325 MG tablet Take 650 mg by mouth every 6 (six) hours as needed for moderate pain or headache.     [name] amLODipine (NORVASC) 10 MG tablet Take 1 tablet (10 mg total) by mouth daily. 90 tablet 1  . atorvastatin (LIPITOR) 40 MG tablet Take 1 tablet by mouth once daily 90 tablet 0  . clopidogrel (PLAVIX) 75 MG tablet Take 1 tablet (75 mg total) by mouth daily. 90 tablet 1  . gabapentin (NEURONTIN) 100 MG capsule TAKE 1 CAPSULE BY MOUTH ONCE DAILY AT BEDTIME 90 capsule 1  . Omega-3 Fatty Acids (FISH OIL) 500 MG CAPS Take by mouth.    . pantoprazole (PROTONIX) 40 MG tablet TAKE 1 TABLET BY MOUTH TWICE DAILY 60 tablet 0  . vitamin C (ASCORBIC ACID) 250 MG tablet Take 250 mg by mouth daily.     No current facility-administered medications on file prior to visit.     Past Medical History:  Diagnosis Date  . Cancer [facility]) 2004   prostate cancer, Dr. Risa Grill  . Colon polyps   . Deep venous thrombosis ([place]) 1997   Postop  . Depression   . GERD (gastroesophageal reflux disease)   . Hyperlipidemia   . Hypertension   . Shoulder fracture, left  02/23/2011   immobilized in sling , Dr Norris(no surgery)    Past Surgical History:  Procedure Laterality Date  . COLONOSCOPY  2006   Negative,Dr.Edwards  . colonoscopy with polypectomy      PMH of  . ESOPHAGOGASTRODUODENOSCOPY N/A 02/23/2017   Procedure: ESOPHAGOGASTRODUODENOSCOPY (EGD);  Surgeon: Otis Brace, MD;  Location: [facility];  Service: Gastroenterology;  Laterality: N/A;  . JOINT REPLACEMENT  1997, 2001   Total Hip replacement X 2  . LUMBAR SPINE SURGERY  09/28/2010   Dr Rolena Infante; for spinal stenosis  . MECKEL DIVERTICULUM EXCISION     Age 16  . PROSTATECTOMY  2004   w/ radiation, Dr Risa Grill  . TOTAL HIP REVISION Left  02/14/2017   Procedure: Left hip polyethylene EXCHANGE;  Surgeon: Gaynelle Arabian, MD;  Location: [facility];  Service: Orthopedics;  Laterality: Left;  [name] VASECTOMY      Social History   Socioeconomic History  . Marital status: Widowed    Spouse name: Not on file  . Number of children: 2  . Years of education: Not on file  . Highest education level: Not on file  Occupational History  . Occupation: retired  Scientific laboratory technician  . Financial resource strain: Not hard at all  . Food insecurity    Worry: Never true    Inability: Never true  . Transportation needs    Medical: No    Non-medical: No  Tobacco Use  . Smoking status: Former Smoker    Packs/day: 0.50    Years: 5.00    Pack years: 2.50    Types: Cigarettes    Quit date: 03/13/1988    Years since quitting: 30.6  . Smokeless tobacco: Former Systems developer  . Tobacco comment: Quit at about age 21 / chewed tobacca x 1 year  Substance and Sexual Activity  . Alcohol use: No    Alcohol/week: 0.0 standard drinks  . Drug use: No  . Sexual activity: Never  Lifestyle  . Physical activity    Days per week: 4 days    Minutes per session: 50 min  . Stress: Not at all  Relationships  . Social connections    Talks on phone: More than three times a week    Gets together: More than three times a week    Attends religious service: More than 4 times per year    Active member of club or organization: Yes    Attends meetings of clubs or organizations: More than 4 times per year    Relationship status: Widowed  Other Topics Concern  . Not on file  Social History Narrative   Lives alone; Wife passed 12/06/2012;   Died of ovarian cancer; breast cancer; other cancer;       Family History  Problem Relation Age of Onset  . Prostate cancer Father   . Testicular cancer Son     Review of Systems  Constitutional: Negative for chills and fever.  Respiratory: Negative for cough, shortness of breath and wheezing.   Cardiovascular: Positive for leg  swelling (mild). Negative for chest pain and palpitations.  Neurological: Negative for light-headedness and headaches.       Objective:   Vitals:   10/31/18 0826  BP: (!) 148/68  Pulse: 86  Temp: 98 F (36.7 C)  SpO2: 96%   BP Readings from Last 3 Encounters:  10/31/18 (!) 148/68  09/30/18 (!) 170/86  09/25/18 (!) 182/75   Wt Readings from Last 3 Encounters:  10/31/18 183 lb 12.8  oz (83.4 kg)  09/30/18 186 lb (84.4 kg)  06/04/18 192 lb 12.8 oz (87.5 kg)   Body mass index is 23.6 kg/m.   Physical Exam    Constitutional: Appears well-developed and well-nourished. No distress.  HENT:  Head: Normocephalic and atraumatic.  Neck: Neck supple. No tracheal deviation present. No thyromegaly present.  No cervical lymphadenopathy Cardiovascular: Normal rate, regular rhythm and normal heart sounds.   No murmur heard. No carotid bruit .  No edema Pulmonary/Chest: Effort normal and breath sounds normal. No respiratory distress. No has no wheezes. No rales.  Skin: Skin is warm and dry. Not diaphoretic.  Psychiatric: Normal mood and affect. Behavior is normal.      Assessment & Plan:    See Problem List for Assessment and Plan of chronic medical problems.

## 2018-10-30 NOTE — Patient Instructions (Addendum)
  Tests ordered today. Your results will be released to [place] (or called to you) after review.  If any changes need to be made, you will be notified at that same time.   Medications reviewed and updated.  Changes include :   myrbetriq for the bladder - this was sent to the pharmacy.        Please followup in 6 months

## 2018-10-31 ENCOUNTER — Other Ambulatory Visit (INDEPENDENT_AMBULATORY_CARE_PROVIDER_SITE_OTHER): Payer: Medicare Other

## 2018-10-31 ENCOUNTER — Other Ambulatory Visit: Payer: Self-pay

## 2018-10-31 ENCOUNTER — Encounter: Payer: Self-pay | Admitting: Internal Medicine

## 2018-10-31 ENCOUNTER — Ambulatory Visit (INDEPENDENT_AMBULATORY_CARE_PROVIDER_SITE_OTHER): Payer: Medicare Other | Admitting: Internal Medicine

## 2018-10-31 VITALS — BP 148/68 | HR 86 | Temp 98.0°F | Ht 74.0 in | Wt 183.8 lb

## 2018-10-31 DIAGNOSIS — R7303 Prediabetes: Secondary | ICD-10-CM | POA: Diagnosis not present

## 2018-10-31 DIAGNOSIS — Z23 Encounter for immunization: Secondary | ICD-10-CM | POA: Diagnosis not present

## 2018-10-31 DIAGNOSIS — E7849 Other hyperlipidemia: Secondary | ICD-10-CM | POA: Diagnosis not present

## 2018-10-31 DIAGNOSIS — I10 Essential (primary) hypertension: Secondary | ICD-10-CM

## 2018-10-31 DIAGNOSIS — Z0000 Encounter for general adult medical examination without abnormal findings: Secondary | ICD-10-CM | POA: Diagnosis not present

## 2018-10-31 DIAGNOSIS — R32 Unspecified urinary incontinence: Secondary | ICD-10-CM

## 2018-10-31 DIAGNOSIS — Z8546 Personal history of malignant neoplasm of prostate: Secondary | ICD-10-CM

## 2018-10-31 LAB — COMPREHENSIVE METABOLIC PANEL
ALT: 15 U/L (ref 0–53)
AST: 20 U/L (ref 0–37)
Albumin: 4.7 g/dL (ref 3.5–5.2)
Alkaline Phosphatase: 53 U/L (ref 39–117)
BUN: 18 mg/dL (ref 6–23)
CO2: 29 mEq/L (ref 19–32)
Calcium: 9.5 mg/dL (ref 8.4–10.5)
Chloride: 106 mEq/L (ref 96–112)
Creatinine, Ser: 0.96 mg/dL (ref 0.40–1.50)
GFR: 74.28 mL/min (ref >60.00)
Glucose, Bld: 128 mg/dL — ABNORMAL HIGH (ref 70–99)
Potassium: 3.8 mEq/L (ref 3.5–5.1)
Sodium: 142 mEq/L (ref 135–145)
Total Bilirubin: 0.8 mg/dL (ref 0.2–1.2)
Total Protein: 6.7 g/dL (ref 6.0–8.3)

## 2018-10-31 LAB — CBC WITH DIFFERENTIAL/PLATELET
Basophils Absolute: 0 10*3/uL (ref 0.0–0.1)
Basophils Relative: 0.8 % (ref 0.0–3.0)
Eosinophils Absolute: 0.1 10*3/uL (ref 0.0–0.7)
Eosinophils Relative: 1.6 % (ref 0.0–5.0)
HCT: 39.2 % (ref 39.0–52.0)
Hemoglobin: 12.7 g/dL — ABNORMAL LOW (ref 13.0–17.0)
Lymphocytes Relative: 21 % (ref 12.0–46.0)
Lymphs Abs: 0.9 10*3/uL (ref 0.7–4.0)
MCHC: 32.3 g/dL (ref 30.0–36.0)
MCV: 86.2 fl (ref 78.0–100.0)
Monocytes Absolute: 0.7 10*3/uL (ref 0.1–1.0)
Monocytes Relative: 16.9 % — ABNORMAL HIGH (ref 3.0–12.0)
Neutro Abs: 2.5 10*3/uL (ref 1.4–7.7)
Neutrophils Relative %: 59.7 % (ref 43.0–77.0)
Platelets: 101 10*3/uL — ABNORMAL LOW (ref 150.0–400.0)
RBC: 4.55 Mil/uL (ref 4.22–5.81)
RDW: 17.6 % — ABNORMAL HIGH (ref 11.5–15.5)
WBC: 4.2 10*3/uL (ref 4.0–10.5)

## 2018-10-31 LAB — LIPID PANEL
Cholesterol: 118 mg/dL (ref 0–200)
HDL: 46.5 mg/dL (ref >39.00)
LDL Cholesterol: 59 mg/dL (ref 0–99)
NonHDL: 71.09
Total CHOL/HDL Ratio: 3
Triglycerides: 59 mg/dL (ref 0.0–149.0)
VLDL: 11.8 mg/dL (ref 0.0–40.0)

## 2018-10-31 LAB — HEMOGLOBIN A1C: Hgb A1c MFr Bld: 6 % (ref 4.6–6.5)

## 2018-10-31 LAB — TSH: TSH: 3.23 u[IU]/mL (ref 0.35–4.50)

## 2018-10-31 LAB — PSA, MEDICARE: PSA: 0 ng/ml — ABNORMAL LOW (ref 0.10–4.00)

## 2018-10-31 MED ORDER — MIRABEGRON ER 25 MG PO TB24: 25.0000 mg | ORAL_TABLET | Freq: Every day | ORAL | 5 refills | Status: DC

## 2018-10-31 NOTE — Assessment & Plan Note (Signed)
BP much better Continue current medications at current doses Blood work today

## 2018-10-31 NOTE — Assessment & Plan Note (Signed)
Urinary incontinence related to previous prostatectomy for cancer Wears pad typically has leakage when active and working in yard, nocturia x 1 only Will try myrbetriq - may or may not help - discussed possible side effects - can stop if he has side effects or it does not work Can consider pelvic PT

## 2018-11-19 ENCOUNTER — Ambulatory Visit (INDEPENDENT_AMBULATORY_CARE_PROVIDER_SITE_OTHER): Payer: Medicare Other | Admitting: Family

## 2018-11-19 ENCOUNTER — Encounter: Payer: Self-pay | Admitting: Family

## 2018-11-19 ENCOUNTER — Ambulatory Visit (INDEPENDENT_AMBULATORY_CARE_PROVIDER_SITE_OTHER)
Admission: RE | Admit: 2018-11-19 | Discharge: 2018-11-19 | Disposition: A | Discharge status: Home / Self Care | Payer: Medicare Other | Source: Ambulatory Visit | Attending: Family | Admitting: Family

## 2018-11-19 ENCOUNTER — Other Ambulatory Visit: Payer: Self-pay

## 2018-11-19 VITALS — BP 142/68 | HR 67 | Temp 97.9°F | Ht 74.0 in | Wt 181.6 lb

## 2018-11-19 DIAGNOSIS — M79671 Pain in right foot: Secondary | ICD-10-CM

## 2018-11-19 DIAGNOSIS — R0781 Pleurodynia: Secondary | ICD-10-CM | POA: Diagnosis not present

## 2018-11-19 DIAGNOSIS — M25571 Pain in right ankle and joints of right foot: Secondary | ICD-10-CM | POA: Diagnosis not present

## 2018-11-19 DIAGNOSIS — S299XXA Unspecified injury of thorax, initial encounter: Secondary | ICD-10-CM | POA: Diagnosis not present

## 2018-11-19 DIAGNOSIS — R079 Chest pain, unspecified: Secondary | ICD-10-CM

## 2018-11-19 DIAGNOSIS — S99921A Unspecified injury of right foot, initial encounter: Secondary | ICD-10-CM | POA: Diagnosis not present

## 2018-11-19 NOTE — Progress Notes (Signed)
Ronald Stafford is a 85 y.o. male with the following history as recorded in EpicCare:  Patient Active Problem List   Diagnosis Date Noted  . Urinary incontinence 10/31/2018  . Left-sided chest wall pain 06/04/2018  . Numbness and tingling of right hand 08/10/2017  . History of total hip arthroplasty 03/28/2017  . Symptomatic anemia 02/22/2017  . Failed total hip arthroplasty ([place]) 02/14/2017  . Osteoarthritis of left hip 12/10/2016  . Acute right ankle pain 11/07/2016  . Acute pain of left shoulder 10/04/2016  . Chest pain 07/30/2016  . History of DVT (deep vein thrombosis) 07/30/2016  . TIA (transient ischemic attack) 07/30/2016  . Thrombocytopenia ([place]) 07/30/2016  . Carotid arterial disease ([place]) 07/20/2016  . Right sided weakness 06/27/2016  . Slurred speech 06/27/2016  . Left wrist pain 08/11/2015  . Frequent headaches 06/22/2015  . Prediabetes 02/11/2015  . Peroneal tendonitis of right lower extremity 01/12/2014  . Mass of right ankle 12/31/2013  . Right ankle instability 12/31/2013  . Radial head fracture, closed 09/18/2013  . Solitary pulmonary nodule 08/08/2012  . SPINAL STENOSIS, LUMBAR 04/06/2010  . LOW BACK PAIN SYNDROME 11/02/2009  . FOOT DROP, RIGHT 05/21/2008  . Hyperlipidemia 12/25/2006  . Essential hypertension 12/25/2006  . GERD 12/25/2006  . PROSTATE CANCER, HX OF 12/25/2006  . POLYP, COLON 05/02/2006    Current Outpatient Medications  Medication Sig Dispense Refill  . acetaminophen (TYLENOL) 325 MG tablet Take 650 mg by mouth every 6 (six) hours as needed for moderate pain or headache.     [name] amLODipine (NORVASC) 10 MG tablet Take 1 tablet (10 mg total) by mouth daily. 90 tablet 1  . atorvastatin (LIPITOR) 40 MG tablet Take 1 tablet by mouth once daily 90 tablet 0  . clopidogrel (PLAVIX) 75 MG tablet Take 1 tablet (75 mg total) by mouth daily. 90 tablet 1  . gabapentin (NEURONTIN) 100 MG capsule TAKE 1 CAPSULE BY MOUTH ONCE DAILY AT BEDTIME 90 capsule 1  .  mirabegron ER (MYRBETRIQ) 25 MG TB24 tablet Take 1 tablet (25 mg total) by mouth daily. 30 tablet 5  . Omega-3 Fatty Acids (FISH OIL) 500 MG CAPS Take by mouth.    . pantoprazole (PROTONIX) 40 MG tablet TAKE 1 TABLET BY MOUTH TWICE DAILY 60 tablet 0  . vitamin C (ASCORBIC ACID) 250 MG tablet Take 250 mg by mouth daily.     No current facility-administered medications for this visit.     Allergies: Chicken allergy  Past Medical History:  Diagnosis Date  . Cancer [facility]) 2004   prostate cancer, Dr. Risa Grill  . Colon polyps   . Deep venous thrombosis ([place]) 1997   Postop  . Depression   . GERD (gastroesophageal reflux disease)   . Hyperlipidemia   . Hypertension   . Shoulder fracture, left 02/23/2011   immobilized in sling , Dr Norris(no surgery)    Past Surgical History:  Procedure Laterality Date  . COLONOSCOPY  2006   Negative,Dr.Edwards  . colonoscopy with polypectomy      PMH of  . ESOPHAGOGASTRODUODENOSCOPY N/A 02/23/2017   Procedure: ESOPHAGOGASTRODUODENOSCOPY (EGD);  Surgeon: Otis Brace, MD;  Location: [facility];  Service: Gastroenterology;  Laterality: N/A;  . JOINT REPLACEMENT  1997, 2001   Total Hip replacement X 2  . LUMBAR SPINE SURGERY  09/28/2010   Dr Rolena Infante; for spinal stenosis  . MECKEL DIVERTICULUM EXCISION     Age 30  . PROSTATECTOMY  2004   w/ radiation, Dr Risa Grill  .  TOTAL HIP REVISION Left 02/14/2017   Procedure: Left hip polyethylene EXCHANGE;  Surgeon: Gaynelle Arabian, MD;  Location: [facility];  Service: Orthopedics;  Laterality: Left;  [name] VASECTOMY      Family History  Problem Relation Age of Onset  . Prostate cancer Father   . Testicular cancer Son     Social History   Tobacco Use  . Smoking status: Former Smoker    Packs/day: 0.50    Years: 5.00    Pack years: 2.50    Types: Cigarettes    Quit date: 03/13/1988    Years since quitting: 30.7  . Smokeless tobacco: Former Systems developer  . Tobacco comment: Quit at about age 84 / chewed tobacca x 1 year   Substance Use Topics  . Alcohol use: No    Alcohol/week: 0.0 standard drinks    Subjective:  Patient notes that he "missed a step" at his house on Saturday and fell forward into a cabinet. He hit the left side of his chest and twisted his right knee/ ankle in the process; did not hit his head- was able to get up with no difficulty; would like to get X-rays today to make sure he has not broken anything; denies any chest pain or shortness or breath, has not coughed up any blood; has been using ice and Tylenol with some benefit;   Objective:  Vitals:   11/19/18 1128  BP: (!) 142/68  Pulse: 67  Temp: 97.9 F (36.6 C)  TempSrc: Oral  SpO2: 97%  Weight: 181 lb 9.6 oz (82.4 kg)  Height: 6\' 2"  (1.88 m)    General: Well developed, well nourished, in no acute distress  Skin : Warm and dry. Bruising noted over left upper chest;  Head: Normocephalic and atraumatic  Eyes: Sclera and conjunctiva clear; pupils round and reactive to light; extraocular movements intact  Lungs: Respirations unlabored; clear to auscultation bilaterally without wheeze, rales, rhonchi  CVS exam: normal rate and regular rhythm.  Musculoskeletal: Extensive swelling noted over right ankle Extremities: No edema, cyanosis, clubbing  Vessels: Symmetric bilaterally  Neurologic: Alert and oriented; speech intact; face symmetrical; moves all extremities well; CNII-XII intact without focal deficit   Assessment:  1. Acute right ankle pain   2. Acute pain of right foot   3. Chest pain, unspecified type     Plan:  Injuries secondary to fall; symptoms have been responding to Tylenol and ice; patient defers any stronger medication; X-rays updated; follow up to be determined;   No follow-ups on file.  Orders Placed This Encounter  Procedures  . DG Chest 2 View    Standing Status:   Future    Standing Expiration Date:   01/19/2020    Order Specific Question:   Reason for Exam (SYMPTOM  OR DIAGNOSIS REQUIRED)    Answer:    pain s/p fall    Order Specific Question:   Preferred imaging location?    Answer:   [facility]    Order Specific Question:   Radiology Contrast Protocol - do NOT remove file path    Answer:   \\[email or web address]s.pdf  . DG Ribs Unilateral Left    Standing Status:   Future    Number of Occurrences:   1    Standing Expiration Date:   01/19/2020    Order Specific Question:   Reason for Exam (SYMPTOM  OR DIAGNOSIS REQUIRED)    Answer:   left rib pain s/p fall    Order Specific Question:   Preferred  imaging location?    Answer:   [facility]    Order Specific Question:   Radiology Contrast Protocol - do NOT remove file path    Answer:   \\[email or web address]\[email or web address]s.pdf  . DG Foot Complete Right    Standing Status:   Future    Number of Occurrences:   1    Standing Expiration Date:   01/19/2020    Order Specific Question:   Reason for Exam (SYMPTOM  OR DIAGNOSIS REQUIRED)    Answer:   foot pain    Order Specific Question:   Preferred imaging location?    Answer:   [facility]    Order Specific Question:   Radiology Contrast Protocol - do NOT remove file path    Answer:   \\[email or web address]\[email or web address]s.pdf  . DG Ankle Complete Right    Standing Status:   Future    Number of Occurrences:   1    Standing Expiration Date:   01/19/2020    Order Specific Question:   Reason for Exam (SYMPTOM  OR DIAGNOSIS REQUIRED)    Answer:   pain s/p fall    Order Specific Question:   Preferred imaging location?    Answer:   [facility]    Order Specific Question:   Radiology Contrast Protocol - do NOT remove file path    Answer:   \\[email or web address]\[email or web address]s.pdf    Requested Prescriptions    No prescriptions requested or ordered in this encounter

## 2019-01-03 ENCOUNTER — Encounter: Payer: Self-pay | Admitting: Internal Medicine

## 2019-01-03 ENCOUNTER — Ambulatory Visit (INDEPENDENT_AMBULATORY_CARE_PROVIDER_SITE_OTHER)
Admission: RE | Admit: 2019-01-03 | Discharge: 2019-01-03 | Disposition: A | Discharge status: Home / Self Care | Payer: Medicare Other | Source: Ambulatory Visit | Attending: Internal Medicine | Admitting: Internal Medicine

## 2019-01-03 ENCOUNTER — Ambulatory Visit (INDEPENDENT_AMBULATORY_CARE_PROVIDER_SITE_OTHER): Payer: Medicare Other | Admitting: Internal Medicine

## 2019-01-03 ENCOUNTER — Other Ambulatory Visit: Payer: Self-pay

## 2019-01-03 VITALS — BP 124/60 | HR 75 | Temp 98.4°F | Resp 98 | Ht 74.0 in | Wt 181.0 lb

## 2019-01-03 DIAGNOSIS — M25512 Pain in left shoulder: Secondary | ICD-10-CM

## 2019-01-03 DIAGNOSIS — R42 Dizziness and giddiness: Secondary | ICD-10-CM | POA: Insufficient documentation

## 2019-01-03 MED ORDER — AMLODIPINE BESYLATE 5 MG PO TABS: 5.0000 mg | ORAL_TABLET | Freq: Every day | ORAL | 3 refills | Status: DC

## 2019-01-03 NOTE — Patient Instructions (Addendum)
Have an x-ray of your shoulder today.    Take the tylenol as needed.  Continue the topical ointment.  You can ice the shoulder.    Revise your activities.     Please call if there is no improvement in your symptoms.

## 2019-01-03 NOTE — Assessment & Plan Note (Signed)
1- 2 weeks of left shoulder pain radiating to the left upper chest when he pushes the shoulder/arm backwards Arthritis, tendinitis possible X-ray today Tylenol is working so he will continue to take that as needed Advised that he can ice the shoulder Can continue topical arthritis medication Deferred further medication or evaluation at this time, but can refer to sports medicine if the pain does not improve Stressed that he needs to revise his activities, which are aggravating the pain Call with questions

## 2019-01-03 NOTE — Progress Notes (Signed)
Subjective:    Patient ID: Ronald Stafford, male    DOB: 26-Oct-1930, 85 y.o.   MRN: [id number]  HPI The patient is here for an acute visit.   Left shoulder and left sided chest pain.  Several years ago he broke the ball of his shoulder but it did not come out of the socket.  He is unsure if that is related to his current pain.  For the past #1-we will 2 weeks he has left shoulder pain that radiates into his left upper chest when he pushes the arm/shoulder backwards.  He also fell recently and that may have aggravated it.  He assumes he probably has arthritis in the shoulder as well, which is likely contributing.  He has been taking Tylenol as needed and that does help.  He is very active around the house-he has been painting a couple of his rooms and he has been stacking wood.  These activities do aggravate the pain.  If he is completely still and does not move the arm he does not have pain.  Moving the arm here recreates the pain.  He denies any pain radiating down the arm.  He denies any numbness, tingling or weakness.  He has fallen a few times recently-he states dizziness.  When asked specifically he states more of a lightheadedness.  He does experience it when he stands up or when he is working outside.     Medications and allergies reviewed with patient and updated if appropriate.  Patient Active Problem List   Diagnosis Date Noted  . Urinary incontinence 10/31/2018  . Left-sided chest wall pain 06/04/2018  . Numbness and tingling of right hand 08/10/2017  . History of total hip arthroplasty 03/28/2017  . Symptomatic anemia 02/22/2017  . Failed total hip arthroplasty ([place]) 02/14/2017  . Osteoarthritis of left hip 12/10/2016  . Acute right ankle pain 11/07/2016  . Acute pain of left shoulder 10/04/2016  . Chest pain 07/30/2016  . History of DVT (deep vein thrombosis) 07/30/2016  . TIA (transient ischemic attack) 07/30/2016  . Thrombocytopenia ([place]) 07/30/2016  . Carotid arterial  disease ([place]) 07/20/2016  . Right sided weakness 06/27/2016  . Slurred speech 06/27/2016  . Left wrist pain 08/11/2015  . Frequent headaches 06/22/2015  . Prediabetes 02/11/2015  . Peroneal tendonitis of right lower extremity 01/12/2014  . Mass of right ankle 12/31/2013  . Right ankle instability 12/31/2013  . Radial head fracture, closed 09/18/2013  . Solitary pulmonary nodule 08/08/2012  . SPINAL STENOSIS, LUMBAR 04/06/2010  . LOW BACK PAIN SYNDROME 11/02/2009  . FOOT DROP, RIGHT 05/21/2008  . Hyperlipidemia 12/25/2006  . Essential hypertension 12/25/2006  . GERD 12/25/2006  . PROSTATE CANCER, HX OF 12/25/2006  . POLYP, COLON 05/02/2006    Current Outpatient Medications on File Prior to Visit  Medication Sig Dispense Refill  . acetaminophen (TYLENOL) 325 MG tablet Take 650 mg by mouth every 6 (six) hours as needed for moderate pain or headache.     [name] amLODipine (NORVASC) 10 MG tablet Take 1 tablet (10 mg total) by mouth daily. 90 tablet 1  . atorvastatin (LIPITOR) 40 MG tablet Take 1 tablet by mouth once daily 90 tablet 0  . clopidogrel (PLAVIX) 75 MG tablet Take 1 tablet (75 mg total) by mouth daily. 90 tablet 1  . gabapentin (NEURONTIN) 100 MG capsule TAKE 1 CAPSULE BY MOUTH ONCE DAILY AT BEDTIME 90 capsule 1  . mirabegron ER (MYRBETRIQ) 25 MG TB24 tablet Take 1 tablet (  25 mg total) by mouth daily. 30 tablet 5  . Omega-3 Fatty Acids (FISH OIL) 500 MG CAPS Take by mouth.    . pantoprazole (PROTONIX) 40 MG tablet TAKE 1 TABLET BY MOUTH TWICE DAILY 60 tablet 0  . vitamin C (ASCORBIC ACID) 250 MG tablet Take 250 mg by mouth daily.     No current facility-administered medications on file prior to visit.     Past Medical History:  Diagnosis Date  . Cancer [facility]) 2004   prostate cancer, Dr. Risa Grill  . Colon polyps   . Deep venous thrombosis ([place]) 1997   Postop  . Depression   . GERD (gastroesophageal reflux disease)   . Hyperlipidemia   . Hypertension   . Shoulder fracture,  left 02/23/2011   immobilized in sling , Dr Norris(no surgery)    Past Surgical History:  Procedure Laterality Date  . COLONOSCOPY  2006   Negative,Dr.Edwards  . colonoscopy with polypectomy      PMH of  . ESOPHAGOGASTRODUODENOSCOPY N/A 02/23/2017   Procedure: ESOPHAGOGASTRODUODENOSCOPY (EGD);  Surgeon: Otis Brace, MD;  Location: [facility];  Service: Gastroenterology;  Laterality: N/A;  . JOINT REPLACEMENT  1997, 2001   Total Hip replacement X 2  . LUMBAR SPINE SURGERY  09/28/2010   Dr Rolena Infante; for spinal stenosis  . MECKEL DIVERTICULUM EXCISION     Age 68  . PROSTATECTOMY  2004   w/ radiation, Dr Risa Grill  . TOTAL HIP REVISION Left 02/14/2017   Procedure: Left hip polyethylene EXCHANGE;  Surgeon: Gaynelle Arabian, MD;  Location: [facility];  Service: Orthopedics;  Laterality: Left;  [name] VASECTOMY      Social History   Socioeconomic History  . Marital status: Widowed    Spouse name: Not on file  . Number of children: 2  . Years of education: Not on file  . Highest education level: Not on file  Occupational History  . Occupation: retired  Scientific laboratory technician  . Financial resource strain: Not hard at all  . Food insecurity    Worry: Never true    Inability: Never true  . Transportation needs    Medical: No    Non-medical: No  Tobacco Use  . Smoking status: Former Smoker    Packs/day: 0.50    Years: 5.00    Pack years: 2.50    Types: Cigarettes    Quit date: 03/13/1988    Years since quitting: 30.8  . Smokeless tobacco: Former Systems developer  . Tobacco comment: Quit at about age 89 / chewed tobacca x 1 year  Substance and Sexual Activity  . Alcohol use: No    Alcohol/week: 0.0 standard drinks  . Drug use: No  . Sexual activity: Never  Lifestyle  . Physical activity    Days per week: 4 days    Minutes per session: 50 min  . Stress: Not at all  Relationships  . Social connections    Talks on phone: More than three times a week    Gets together: More than three times a week     Attends religious service: More than 4 times per year    Active member of club or organization: Yes    Attends meetings of clubs or organizations: More than 4 times per year    Relationship status: Widowed  Other Topics Concern  . Not on file  Social History Narrative   Lives alone; Wife passed 05-Dec-2012;   Died of ovarian cancer; breast cancer; other cancer;  Family History  Problem Relation Age of Onset  . Prostate cancer Father   . Testicular cancer Son     Review of Systems  Constitutional: Negative for chills and fever.  Respiratory: Negative for shortness of breath.   Cardiovascular: Positive for chest pain (Left shoulder pain radiating to left chest-he only has this pain when he moves his arm in a certain way). Negative for palpitations and leg swelling.  Musculoskeletal: Positive for arthralgias. Negative for myalgias and neck pain.  Neurological: Positive for light-headedness. Negative for weakness and numbness.       Objective:   Vitals:   01/03/19 1507  BP: 124/60  Pulse: 75  Resp: (!) 98  Temp: 98.4 F (36.9 C)   BP Readings from Last 3 Encounters:  01/03/19 124/60  11/19/18 (!) 142/68  10/31/18 (!) 148/68   Wt Readings from Last 3 Encounters:  01/03/19 181 lb (82.1 kg)  11/19/18 181 lb 9.6 oz (82.4 kg)  10/31/18 183 lb 12.8 oz (83.4 kg)   Body mass index is 23.24 kg/m.   Physical Exam Constitutional:      Appearance: Normal appearance. He is not toxic-appearing or diaphoretic.  HENT:     Head: Normocephalic and atraumatic.  Cardiovascular:     Rate and Rhythm: Normal rate and regular rhythm.  Pulmonary:     Effort: Pulmonary effort is normal.     Breath sounds: Normal breath sounds.  Musculoskeletal:     Comments: Left shoulder without tenderness with palpation, no swelling or deformity.  Pain in left shoulder that radiates into the chest with posterior extension of arm/shoulder.  Otherwise normal ROM.  Neurological:     Mental  Status: He is alert.     Sensory: No sensory deficit.     Motor: No weakness.            Assessment & Plan:    See Problem List for Assessment and Plan of chronic medical problems.

## 2019-01-03 NOTE — Assessment & Plan Note (Signed)
He is experiencing lightheadedness when he stands and when he is doing very active yardwork or housework Concern for postural lightheadedness-blood pressure here is excellent, but likely to well controlled Decrease amlodipine to 5 mg daily Advised to make sure he is drinking plenty of fluids Call or follow-up if no improvement

## 2019-01-05 ENCOUNTER — Encounter: Payer: Self-pay | Admitting: Internal Medicine

## 2019-01-29 ENCOUNTER — Telehealth: Payer: Self-pay | Admitting: Internal Medicine

## 2019-01-29 DIAGNOSIS — I739 Peripheral vascular disease, unspecified: Secondary | ICD-10-CM

## 2019-01-29 NOTE — Telephone Encounter (Signed)
Copied from [place] [id number]. Topic: General - Inquiry >> Jan 29, 2019  9:02 AM Richardo Priest, NT wrote: Reason for CRM: Ms.Yeboah, NP with [facility], called in stating she did a PAD test on patient for circulation and would like to go over results with PCP or RN. Please advise. Call back is [phone]. NP disconnected before giving any other information

## 2019-01-29 NOTE — Telephone Encounter (Signed)
Referral for vascular ordered   [facility] for him to get the shingles vaccine

## 2019-01-29 NOTE — Telephone Encounter (Signed)
Pt is interested in the referral. Wanted to make sure it was ok with you that he get the shingles shot.

## 2019-01-29 NOTE — Telephone Encounter (Signed)
Please call and get the results - I can refer to vascular for further testing.

## 2019-01-29 NOTE — Telephone Encounter (Signed)
Left foot 0.88  Right foot 0.71

## 2019-01-29 NOTE — Telephone Encounter (Signed)
Please call him.  See what symptoms he is having in his legs, especially when he ambulates-pain or fatigue.  See if he would like to see vascular surgeon for further evaluation of possible peripheral arterial disease.  If so I will refer him.

## 2019-01-30 NOTE — Telephone Encounter (Signed)
Notified pt w/MD response. Made nurse visit for shingle inj.Marland KitchenJohny Stafford

## 2019-01-31 ENCOUNTER — Ambulatory Visit: Payer: Medicare Other

## 2019-02-03 ENCOUNTER — Other Ambulatory Visit: Payer: Self-pay | Admitting: Internal Medicine

## 2019-03-23 ENCOUNTER — Other Ambulatory Visit: Payer: Self-pay | Admitting: Internal Medicine

## 2019-03-24 ENCOUNTER — Telehealth (HOSPITAL_COMMUNITY): Payer: Self-pay

## 2019-03-24 ENCOUNTER — Other Ambulatory Visit: Payer: Self-pay

## 2019-03-24 DIAGNOSIS — I739 Peripheral vascular disease, unspecified: Secondary | ICD-10-CM

## 2019-03-24 NOTE — Telephone Encounter (Signed)
The above patient or their representative was contacted and gave the following answers to these questions:         Do you have any of the following symptoms?    NO  Fever                    Cough                   Shortness of breath  Do  you have any of the following other symptoms?    muscle pain         vomiting,        diarrhea        rash         weakness        red eye        abdominal pain         bruising          bruising or bleeding              joint pain           severe headache    Have you been in contact with someone who was or has been sick in the past 2 weeks?  NO  Yes                 Unsure                         Unable to assess   Does the person that you were in contact with have any of the following symptoms?   Cough         shortness of breath           muscle pain         vomiting,            diarrhea            rash            weakness           fever            red eye           abdominal pain           bruising  or  bleeding                joint pain                severe headache                 COMMENTS OR ACTION PLAN FOR THIS PATIENT:        All questions were annswered/[facility]

## 2019-03-25 ENCOUNTER — Other Ambulatory Visit: Payer: Self-pay

## 2019-03-25 ENCOUNTER — Ambulatory Visit (HOSPITAL_COMMUNITY)
Admission: RE | Admit: 2019-03-25 | Discharge: 2019-03-25 | Disposition: A | Discharge status: Home / Self Care | Payer: Medicare Other | Source: Ambulatory Visit | Attending: Vascular Surgery | Admitting: Vascular Surgery

## 2019-03-25 ENCOUNTER — Encounter: Payer: Self-pay | Admitting: Vascular Surgery

## 2019-03-25 ENCOUNTER — Ambulatory Visit: Payer: Medicare Other | Admitting: Vascular Surgery

## 2019-03-25 VITALS — BP 129/64 | HR 74 | Temp 97.9°F | Resp 20 | Ht 74.0 in | Wt 185.0 lb

## 2019-03-25 DIAGNOSIS — R948 Abnormal results of function studies of other organs and systems: Secondary | ICD-10-CM

## 2019-03-25 DIAGNOSIS — I739 Peripheral vascular disease, unspecified: Secondary | ICD-10-CM | POA: Diagnosis not present

## 2019-03-25 DIAGNOSIS — R6889 Other general symptoms and signs: Secondary | ICD-10-CM

## 2019-03-25 NOTE — Progress Notes (Signed)
Vascular and Vein Specialist of [place]  Patient name: Ronald Stafford MRN: [id number] DOB: Jun 07, 1930 Sex: male  REASON FOR CONSULT: Discuss abnormal lower extremity arterial screening study from home health  HPI: Ronald Stafford is a 86 y.o. male, who is here today for discussion of abnormal lower extremity screening study.  He is a very active 86 year old gentleman.  He reports that he rides an exercise bike and uses upper arm exercise on a daily basis.  He denies any claudication symptoms and no arterial rest pain and no lower extremity tissue loss.  He underwent a home health screening and was told that he had abnormal studies.  He is here today for further discussion does have several prior hip surgeries with hip fracture on the left.  Past Medical History:  Diagnosis Date  . Cancer [facility]) 2004   prostate cancer, Dr. Risa Grill  . Colon polyps   . Deep venous thrombosis ([facility]) 1997   Postop  . Depression   . GERD (gastroesophageal reflux disease)   . Hyperlipidemia   . Hypertension   . Shoulder fracture, left 02/23/2011   immobilized in sling , Dr Norris(no surgery)    Family History  Problem Relation Age of Onset  . Prostate cancer Father   . Testicular cancer Son     SOCIAL HISTORY: Social History   Socioeconomic History  . Marital status: Widowed    Spouse name: Not on file  . Number of children: 2  . Years of education: Not on file  . Highest education level: Not on file  Occupational History  . Occupation: retired  Tobacco Use  . Smoking status: Former Smoker    Packs/day: 0.50    Years: 5.00    Pack years: 2.50    Types: Cigarettes    Quit date: 03/13/1988    Years since quitting: 31.0  . Smokeless tobacco: Former Systems developer  . Tobacco comment: Quit at about age 75 / chewed tobacca x 1 year  Substance and Sexual Activity  . Alcohol use: No    Alcohol/week: 0.0 standard drinks  . Drug use: No  . Sexual activity: Never  Other Topics  Concern  . Not on file  Social History Narrative   Lives alone; Wife passed December 02, 2012;   Died of ovarian cancer; breast cancer; other cancer;      Social Determinants of Health   Financial Resource Strain: Low Risk   . Difficulty of Paying Living Expenses: Not hard at all  Food Insecurity: No Food Insecurity  . Worried About Charity fundraiser in the Last Year: Never true  . Ran Out of Food in the Last Year: Never true  Transportation Needs: No Transportation Needs  . Lack of Transportation (Medical): No  . Lack of Transportation (Non-Medical): No  Physical Activity: Sufficiently Active  . Days of Exercise per Week: 4 days  . Minutes of Exercise per Session: 50 min  Stress: No Stress Concern Present  . Feeling of Stress : Not at all  Social Connections: Slightly Isolated  . Frequency of Communication with Friends and Family: More than three times a week  . Frequency of Social Gatherings with Friends and Family: More than three times a week  . Attends Religious Services: More than 4 times per year  . Active Member of Clubs or Organizations: Yes  . Attends Archivist Meetings: More than 4 times per year  . Marital Status: Widowed  Intimate Partner Violence:   .  Fear of Current or Ex-Partner: Not on file  . Emotionally Abused: Not on file  . Physically Abused: Not on file  . Sexually Abused: Not on file    Allergies  Allergen Reactions  . Chicken Allergy Nausea And Vomiting and Other (See Comments)    Digestive problems    Current Outpatient Medications  Medication Sig Dispense Refill  . acetaminophen (TYLENOL) 325 MG tablet Take 650 mg by mouth every 6 (six) hours as needed for moderate pain or headache.     [name] amLODipine (NORVASC) 5 MG tablet Take 5 mg by mouth daily.    [name] atorvastatin (LIPITOR) 40 MG tablet Take 1 tablet by mouth once daily 90 tablet 0  . clopidogrel (PLAVIX) 75 MG tablet Take 1 tablet (75 mg total) by mouth daily. 90 tablet 1  .  gabapentin (NEURONTIN) 100 MG capsule Take 1 capsule by mouth once daily at bedtime 90 capsule 0  . mirabegron ER (MYRBETRIQ) 25 MG TB24 tablet Take 1 tablet (25 mg total) by mouth daily. 30 tablet 5  . Omega-3 Fatty Acids (FISH OIL) 500 MG CAPS Take by mouth.    . pantoprazole (PROTONIX) 40 MG tablet TAKE 1 TABLET BY MOUTH TWICE DAILY 60 tablet 0  . vitamin C (ASCORBIC ACID) 250 MG tablet Take 250 mg by mouth daily.     No current facility-administered medications for this visit.    REVIEW OF SYSTEMS:  [X]  denotes positive finding, [ ]  denotes negative finding Cardiac  Comments:  Chest pain or chest pressure:    Shortness of breath upon exertion:    Short of breath when lying flat:    Irregular heart rhythm:        Vascular    Pain in calf, thigh, or hip brought on by ambulation:    Pain in feet at night that wakes you up from your sleep:     Blood clot in your veins:    Leg swelling:         Pulmonary    Oxygen at home:    Productive cough:     Wheezing:         Neurologic    Sudden weakness in arms or legs:     Sudden numbness in arms or legs:     Sudden onset of difficulty speaking or slurred speech:    Temporary loss of vision in one eye:     Problems with dizziness:         Gastrointestinal    Blood in stool:     Vomited blood:         Genitourinary    Burning when urinating:     Blood in urine:        Psychiatric    Major depression:         Hematologic    Bleeding problems:    Problems with blood clotting too easily:        Skin    Rashes or ulcers:        Constitutional    Fever or chills:      PHYSICAL EXAM: Vitals:   03/25/19 0823  BP: 129/64  Pulse: 74  Resp: 20  Temp: 97.9 F (36.6 C)  SpO2: 99%  Weight: 185 lb (83.9 kg)  Height: 6\' 2"  (1.88 m)    GENERAL: The patient is a well-nourished male, in no acute distress. The vital signs are documented above. CARDIOVASCULAR: Carotid arteries without bruits bilaterally.  2+ radial pulses  bilaterally.  2+ posterior tibial pulses bilaterally PULMONARY: There is good air exchange  ABDOMEN: Soft and non-tender  MUSCULOSKELETAL: There are no major deformities or cyanosis. NEUROLOGIC: No focal weakness or paresthesias are detected. SKIN: There are no ulcers or rashes noted. PSYCHIATRIC: The patient has a normal affect.  DATA:  Noninvasive studies in our office today were reviewed with the patient.  This shows normal ankle arm index and normal triphasic waveforms in both lower extremities  I did review his lower extremity studies as the screen which showed mildly diminished ankle arm index.  The patient reports that he simply had toe pressures checked.  MEDICAL ISSUES: I had a long discussion with the patient.  Explained that he has normal arterial flow and that this was a false positive screen.  He was reassured with this discussion will see [place] again on an as-needed basis   Rosetta Posner, MD [facility] V[facility] and Vein Specialists of [facility] Tel [phone] Pager [phone]

## 2019-04-19 ENCOUNTER — Other Ambulatory Visit: Payer: Self-pay | Admitting: Internal Medicine

## 2019-04-21 ENCOUNTER — Ambulatory Visit: Payer: Medicare Other

## 2019-06-02 ENCOUNTER — Ambulatory Visit: Payer: Medicare Other

## 2019-06-08 ENCOUNTER — Other Ambulatory Visit: Payer: Self-pay | Admitting: Internal Medicine

## 2019-06-16 ENCOUNTER — Telehealth: Payer: Self-pay | Admitting: Internal Medicine

## 2019-06-16 DIAGNOSIS — I779 Disorder of arteries and arterioles, unspecified: Secondary | ICD-10-CM

## 2019-06-16 NOTE — Progress Notes (Signed)
  Chronic Care Management   Note  06/16/2019 Name: Ronald Stafford MRN: [id number] DOB: October 07, 1930  Ronald Stafford is a 86 y.o. year old male who is a primary care patient of Burns, Claudina Lick, MD. I reached out to [place] by phone today in response to a referral sent by Mr. Ahmi Nunnery Ditto's PCP, Quay Burow, Claudina Lick, MD.   Ronald Stafford was given information about Chronic Care Management services today including:  1. CCM service includes personalized support from designated clinical staff supervised by his physician, including individualized plan of care and coordination with other care providers 2. 24/7 contact phone numbers for assistance for urgent and routine care needs. 3. Service will only be billed when office clinical staff spend 20 minutes or more in a month to coordinate care. 4. Only one practitioner may furnish and bill the service in a calendar month. 5. The patient may stop CCM services at any time (effective at the end of the month) by phone call to the office staff.   Patient agreed to services and verbal consent obtained.   Follow up plan:   Raynicia Dukes [employer] Scheduler

## 2019-06-18 ENCOUNTER — Other Ambulatory Visit: Payer: Self-pay

## 2019-06-18 ENCOUNTER — Ambulatory Visit (INDEPENDENT_AMBULATORY_CARE_PROVIDER_SITE_OTHER): Payer: Medicare Other

## 2019-06-18 VITALS — BP 122/80 | HR 82 | Temp 98.2°F | Resp 16 | Ht 74.0 in | Wt 192.8 lb

## 2019-06-18 DIAGNOSIS — Z0000 Encounter for general adult medical examination without abnormal findings: Secondary | ICD-10-CM

## 2019-06-18 NOTE — Patient Instructions (Signed)
Ronald Stafford , Thank you for taking time to come for your Medicare Wellness Visit. I appreciate your ongoing commitment to your health goals. Please review the following plan we discussed and let me know if I can assist you in the future.   Screening recommendations/referrals: Colorectal Screening: 10/21/2014  Vision and Dental Exams: Recommended annual ophthalmology exams for early detection of glaucoma and other disorders of the eye Recommended annual dental exams for proper oral hygiene  Vaccinations: Influenza vaccine: 10/31/2018 Pneumococcal vaccine: 02/28/2013 & 07/14/2014 Tdap vaccine: 04/04/2016 Shingles vaccine: denied Covid vaccine: 04/03/2019, 04/24/2019  Advanced directives: Advance directives discussed with you today. Please bring a copy of your POA (Power of [place]) and/or Living Will to your next appointment.  Goals:  Recommend to drink at least 6-8 8oz glasses of water per day.  Recommend to exercise for at least 150 minutes per week.  Recommend to remove any items from the home that may cause slips or trips  Recommend to decrease portion sizes by eating 3 small healthy meals and at least 2 healthy snacks per day.  Recommend to begin DASH diet as directed below  Recommend to continue efforts to reduce smoking habits until no longer smoking. Smoking Cessation literature is attached below.  Next appointment: Please schedule your Annual Wellness Visit with your Nurse Health Advisor in one year.  Preventive Care 23 Years and Older, Male Preventive care refers to lifestyle choices and visits with your health care provider that can promote health and wellness. What does preventive care include?  A yearly physical exam. This is also called an annual well check.  Dental exams once or twice a year.  Routine eye exams. Ask your health care provider how often you should have your eyes checked.  Personal lifestyle choices, including:  Daily care of your teeth and  gums.  Regular physical activity.  Eating a healthy diet.  Avoiding tobacco and drug use.  Limiting alcohol use.  Practicing safe sex.  Taking low doses of aspirin every day if recommended by your health care provider..  Taking vitamin and mineral supplements as recommended by your health care provider. What happens during an annual well check? The services and screenings done by your health care provider during your annual well check will depend on your age, overall health, lifestyle risk factors, and family history of disease. Counseling  Your health care provider may ask you questions about your:  Alcohol use.  Tobacco use.  Drug use.  Emotional well-being.  Home and relationship well-being.  Sexual activity.  Eating habits.  History of falls.  Memory and ability to understand (cognition).  Work and work Statistician. Screening  You may have the following tests or measurements:  Height, weight, and BMI.  Blood pressure.  Lipid and cholesterol levels. These may be checked every 5 years, or more frequently if you are over 74 years old.  Skin check.  Lung cancer screening. You may have this screening every year starting at age 28 if you have a 30-pack-year history of smoking and currently smoke or have quit within the past 15 years.  Fecal occult blood test (FOBT) of the stool. You may have this test every year starting at age 91.  Flexible sigmoidoscopy or colonoscopy. You may have a sigmoidoscopy every 5 years or a colonoscopy every 10 years starting at age 76.  Prostate cancer screening. Recommendations will vary depending on your family history and other risks.  Hepatitis C blood test.  Hepatitis B blood test.  Sexually  transmitted disease (STD) testing.  Diabetes screening. This is done by checking your blood sugar (glucose) after you have not eaten for a while (fasting). You may have this done every 1-3 years.  Abdominal aortic aneurysm (AAA)  screening. You may need this if you are a current or former smoker.  Osteoporosis. You may be screened starting at age 46 if you are at high risk. Talk with your health care provider about your test results, treatment options, and if necessary, the need for more tests. Vaccines  Your health care provider may recommend certain vaccines, such as:  Influenza vaccine. This is recommended every year.  Tetanus, diphtheria, and acellular pertussis (Tdap, Td) vaccine. You may need a Td booster every 10 years.  Zoster vaccine. You may need this after age 55.  Pneumococcal 13-valent conjugate (PCV13) vaccine. One dose is recommended after age 72.  Pneumococcal polysaccharide (PPSV23) vaccine. One dose is recommended after age 11. Talk to your health care provider about which screenings and vaccines you need and how often you need them. This information is not intended to replace advice given to you by your health care provider. Make sure you discuss any questions you have with your health care provider. Document Released: 03/26/2015 Document Revised: 11/17/2015 Document Reviewed: 12/29/2014 Elsevier Interactive Patient Education  2017 [place] Prevention in the Home Falls can cause injuries. They can happen to people of all ages. There are many things you can do to make your home safe and to help prevent falls. What can I do on the outside of my home?  Regularly fix the edges of walkways and driveways and fix any cracks.  Remove anything that might make you trip as you walk through a door, such as a raised step or threshold.  Trim any bushes or trees on the path to your home.  Use bright outdoor lighting.  Clear any walking paths of anything that might make someone trip, such as rocks or tools.  Regularly check to see if handrails are loose or broken. Make sure that both sides of any steps have handrails.  Any raised decks and porches should have guardrails on the  edges.  Have any leaves, snow, or ice cleared regularly.  Use sand or salt on walking paths during winter.  Clean up any spills in your garage right away. This includes oil or grease spills. What can I do in the bathroom?  Use night lights.  Install grab bars by the toilet and in the tub and shower. Do not use towel bars as grab bars.  Use non-skid mats or decals in the tub or shower.  If you need to sit down in the shower, use a plastic, non-slip stool.  Keep the floor dry. Clean up any water that spills on the floor as soon as it happens.  Remove soap buildup in the tub or shower regularly.  Attach bath mats securely with double-sided non-slip rug tape.  Do not have throw rugs and other things on the floor that can make you trip. What can I do in the bedroom?  Use night lights.  Make sure that you have a light by your bed that is easy to reach.  Do not use any sheets or blankets that are too big for your bed. They should not hang down onto the floor.  Have a firm chair that has side arms. You can use this for support while you get dressed.  Do not have throw rugs and other things  on the floor that can make you trip. What can I do in the kitchen?  Clean up any spills right away.  Avoid walking on wet floors.  Keep items that you use a lot in easy-to-reach places.  If you need to reach something above you, use a strong step stool that has a grab bar.  Keep electrical cords out of the way.  Do not use floor polish or wax that makes floors slippery. If you must use wax, use non-skid floor wax.  Do not have throw rugs and other things on the floor that can make you trip. What can I do with my stairs?  Do not leave any items on the stairs.  Make sure that there are handrails on both sides of the stairs and use them. Fix handrails that are broken or loose. Make sure that handrails are as long as the stairways.  Check any carpeting to make sure that it is firmly  attached to the stairs. Fix any carpet that is loose or worn.  Avoid having throw rugs at the top or bottom of the stairs. If you do have throw rugs, attach them to the floor with carpet tape.  Make sure that you have a light switch at the top of the stairs and the bottom of the stairs. If you do not have them, ask someone to add them for you. What else can I do to help prevent falls?  Wear shoes that:  Do not have high heels.  Have rubber bottoms.  Are comfortable and fit you well.  Are closed at the toe. Do not wear sandals.  If you use a stepladder:  Make sure that it is fully opened. Do not climb a closed stepladder.  Make sure that both sides of the stepladder are locked into place.  Ask someone to hold it for you, if possible.  Clearly mark and make sure that you can see:  Any grab bars or handrails.  First and last steps.  Where the edge of each step is.  Use tools that help you move around (mobility aids) if they are needed. These include:  Canes.  Walkers.  Scooters.  Crutches.  Turn on the lights when you go into a dark area. Replace any light bulbs as soon as they burn out.  Set up your furniture so you have a clear path. Avoid moving your furniture around.  If any of your floors are uneven, fix them.  If there are any pets around you, be aware of where they are.  Review your medicines with your doctor. Some medicines can make you feel dizzy. This can increase your chance of falling. Ask your doctor what other things that you can do to help prevent falls. This information is not intended to replace advice given to you by your health care provider. Make sure you discuss any questions you have with your health care provider. Document Released: 12/24/2008 Document Revised: 08/05/2015 Document Reviewed: 04/03/2014 Elsevier Interactive Patient Education  2017 [employer].

## 2019-06-18 NOTE — Progress Notes (Signed)
Subjective:   Ronald Stafford is a 86 y.o. male who presents for Medicare Annual/Subsequent preventive examination.  Review of Systems:  Medicare Wellness Visit Cardiac Risk Factors include: advanced age (>42men, >21 women);dyslipidemia;male gender;hypertension   Sleep Patterns: No issues with falling and staying asleep; feels rested after waking; sleeps 8+ hours a night  Home Safety/Smoke Alarms: Feels safe in home; Smoke alarms in place. Living environment: 2-story home (lives on the first floor).  Lives alone. Seat Belt Safety/Bike Helmet: Wears seat belt.     Objective:    Vitals: BP 122/80 (BP Location: Left Arm, Patient Position: Sitting, Cuff Size: Normal)   Pulse 82   Temp 98.2 F (36.8 C)   Resp 16   Ht 6\' 2"  (1.88 m)   Wt 192 lb 12.8 oz (87.5 kg)   SpO2 99%   BMI 24.75 kg/m   Body mass index is 24.75 kg/m.  Advanced Directives 03/25/2019 04/18/2018 02/22/2017 02/22/2017 02/14/2017 02/14/2017 02/09/2017  Does Patient Have a Medical Advance Directive? No Yes Yes Yes Yes Yes Yes  Type of Advance Directive - [place];Living will [place];Living will [place];Living will [place];Living will [place];Living will [place];Living will  Does patient want to make changes to medical advance directive? - - No - Patient declined - No - Patient declined No - Patient declined No - Patient declined  Copy of [place] in Chart? - No - copy requested No - copy requested No - copy requested Yes Yes Yes  Would patient like information on creating a medical advance directive? No - Patient declined - - - - - -    Tobacco Social History   Tobacco Use  Smoking Status Former Smoker  . Packs/day: 0.50  . Years: 5.00  . Pack years: 2.50  . Types: Cigarettes  . Quit date: 03/13/1988  . Years since quitting: 31.2  Smokeless Tobacco Former Systems developer  Tobacco  Comment   Quit at about age 47 / chewed tobacca x 1 year     Counseling given: No Comment: Quit at about age 27 / chewed tobacca x 1 year   Clinical Intake:  Pre-visit preparation completed: Yes  Pain : No/denies pain Pain Score: 0-No pain     Nutritional Risks: None Diabetes: No  How often do you need to have someone help you when you read instructions, pamphlets, or other written materials from your doctor or pharmacy?: 1 - Never What is the last grade level you completed in school?: 11th grade  Interpreter Needed?: No  Information entered by :: Kimmi Acocella N. Lowell Guitar, LPN  Past Medical History:  Diagnosis Date  . Cancer [facility]) 2004   prostate cancer, Dr. Risa Grill  . Colon polyps   . Deep venous thrombosis ([place]) 1997   Postop  . Depression   . GERD (gastroesophageal reflux disease)   . Hyperlipidemia   . Hypertension   . Shoulder fracture, left 02/23/2011   immobilized in sling , Dr Norris(no surgery)   Past Surgical History:  Procedure Laterality Date  . COLONOSCOPY  2006   Negative,Dr.Edwards  . colonoscopy with polypectomy      PMH of  . ESOPHAGOGASTRODUODENOSCOPY N/A 02/23/2017   Procedure: ESOPHAGOGASTRODUODENOSCOPY (EGD);  Surgeon: Otis Brace, MD;  Location: [facility];  Service: Gastroenterology;  Laterality: N/A;  . JOINT REPLACEMENT  1997, 2001   Total Hip replacement X 2  . LUMBAR SPINE SURGERY  09/28/2010   Dr Rolena Infante; for spinal stenosis  . MECKEL DIVERTICULUM EXCISION     Age 27  . PROSTATECTOMY  2004   w/ radiation, Dr Risa Grill  . TOTAL HIP REVISION Left 02/14/2017   Procedure: Left hip polyethylene EXCHANGE;  Surgeon: Gaynelle Arabian, MD;  Location: [facility];  Service: Orthopedics;  Laterality: Left;  [name] VASECTOMY     Family History  Problem Relation Age of Onset  . Prostate cancer Father   . Testicular cancer Son    Social History   Socioeconomic History  . Marital status: Widowed    Spouse name: Not on file  . Number of children: 2    . Years of education: Not on file  . Highest education level: Not on file  Occupational History  . Occupation: retired  Tobacco Use  . Smoking status: Former Smoker    Packs/day: 0.50    Years: 5.00    Pack years: 2.50    Types: Cigarettes    Quit date: 03/13/1988    Years since quitting: 31.2  . Smokeless tobacco: Former Systems developer  . Tobacco comment: Quit at about age 33 / chewed tobacca x 1 year  Substance and Sexual Activity  . Alcohol use: No    Alcohol/week: 0.0 standard drinks  . Drug use: No  . Sexual activity: Never  Other Topics Concern  . Not on file  Social History Narrative   Lives alone; Wife passed 12/12/12;   Died of ovarian cancer; breast cancer; other cancer;      Social Determinants of Health   Financial Resource Strain:   . Difficulty of Paying Living Expenses:   Food Insecurity:   . Worried About Charity fundraiser in the Last Year:   . Arboriculturist in the Last Year:   Transportation Needs:   . Film/video editor (Medical):   [name] Lack of Transportation (Non-Medical):   Physical Activity:   . Days of Exercise per Week:   . Minutes of Exercise per Session:   Stress:   . Feeling of Stress :   Social Connections:   . Frequency of Communication with Friends and Family:   . Frequency of Social Gatherings with Friends and Family:   . Attends Religious Services:   . Active Member of Clubs or Organizations:   . Attends Archivist Meetings:   [name] Marital Status:     Outpatient Encounter Medications as of 06/18/2019  Medication Sig  . acetaminophen (TYLENOL) 325 MG tablet Take 650 mg by mouth every 6 (six) hours as needed for moderate pain or headache.   [name] amLODipine (NORVASC) 10 MG tablet Take 1 tablet by mouth once daily  . amLODipine (NORVASC) 5 MG tablet Take 5 mg by mouth daily.  [name] atorvastatin (LIPITOR) 40 MG tablet Take 1 tablet (40 mg total) by mouth daily. Need office visit for more refills.  . clopidogrel (PLAVIX) 75 MG tablet  Take 1 tablet by mouth once daily  . gabapentin (NEURONTIN) 100 MG capsule Take 1 capsule by mouth once daily at bedtime  . mirabegron ER (MYRBETRIQ) 25 MG TB24 tablet Take 1 tablet (25 mg total) by mouth daily.  . Omega-3 Fatty Acids (FISH OIL) 500 MG CAPS Take by mouth.  . pantoprazole (PROTONIX) 40 MG tablet TAKE 1 TABLET BY MOUTH TWICE DAILY  . vitamin C (ASCORBIC ACID) 250 MG tablet Take 250 mg by mouth daily.   No facility-administered encounter medications on file as of 06/18/2019.  Activities of Daily Living In your present state of health, do you have any difficulty performing the following activities: 06/18/2019  Hearing? N  Difficulty concentrating or making decisions? Y  Walking or climbing stairs? N  Dressing or bathing? N  Doing errands, shopping? N  Preparing Food and eating ? N  Using the Toilet? N  In the past six months, have you accidently leaked urine? N  Do you have problems with loss of bowel control? N  Managing your Medications? N  Managing your Finances? N  Housekeeping or managing your Housekeeping? N  Some recent data might be hidden    Patient Care Team: Binnie Rail, MD as PCP - General (Internal Medicine) Rana Snare, MD as Consulting Physician (Urology) Marybelle Killings, MD as Consulting Physician (Orthopedic Surgery) Charlton Haws, [facility] as Pharmacist (Pharmacist) Early, Arvilla Meres, MD as Consulting Physician (Vascular Surgery)   Assessment:   This is a routine wellness examination for [place].  Exercise Activities and Dietary recommendations Current Exercise Habits: The patient does not participate in regular exercise at present, Exercise limited by: cardiac condition(s);orthopedic condition(s);neurologic condition(s)  Goals    . <enter goal here> (pt-stated)     To continue to enjoy life/ Discussed walking everyday would help increase HDL     . exercise (pt-stated)     Will have right foot checked so he can walk more;  Will also try to  cut back on sodium     . Patient Stated     Stay physically and socially active. Continue to eat healthy and exercise.    . Patient Stated     To enjoy life to the fullest       Fall Risk Fall Risk  06/18/2019 04/18/2018 12/14/2017 08/02/2016 08/19/2015  Falls in the past year? 1 1 Yes No No  Number falls in past yr: 1 0 2 or more - -  Injury with Fall? 0 0 Yes - -  Risk Factor Category  - - High Fall Risk - -  Risk for fall due to : Impaired balance/gait;Orthopedic patient - - - -  Risk for fall due to: Comment - - - - -  Follow up Falls evaluation completed;Education provided;Falls prevention discussed Falls prevention discussed;Education provided - - -   Is the patient's home free of loose throw rugs in walkways, pet beds, electrical cords, etc?   yes      Grab bars in the bathroom? yes      Handrails on the stairs?   yes      Adequate lighting?   yes   Depression Screen PHQ 2/9 Scores 06/18/2019 04/18/2018 08/02/2016 07/19/2015  PHQ - 2 Score 0 0 0 0    Cognitive Function MMSE - Mini Mental State Exam 07/19/2015 07/14/2014  Not completed: (No Data) Unable to complete     6CIT Screen 06/18/2019  What Year? 0 points  What month? 0 points  What time? 0 points  Count back from 20 2 points  Months in reverse 2 points  Repeat phrase 0 points  Total Score 4    Immunization History  Administered Date(s) Administered  . Fluad Quad(high Dose 65+) 10/31/2018  . Influenza Split 12/20/2010, 12/13/2011  . Influenza Whole 12/06/2007, 01/05/2009, 12/06/2009  . Influenza, High Dose Seasonal PF 12/18/2013, 12/07/2014, 12/17/2015, 12/14/2017  . Influenza,inj,Quad PF,6+ Mos 01/01/2013  . Influenza,inj,quad, With Preservative 12/11/2016  . Influenza-Unspecified 11/27/2016  . Pneumococcal Conjugate-13 07/14/2014  . Pneumococcal Polysaccharide-23 02/28/2013  . Tdap 06/06/2005, 04/04/2016  .  Zoster 03/01/2011    Qualifies for Shingles Vaccine? declined  Screening Tests Health Maintenance    Topic Date Due  . INFLUENZA VACCINE  10/12/2019  . TETANUS/TDAP  04/04/2026  . PNA vac Low Risk Adult  Completed   Cancer Screenings: Lung: Low Dose CT Chest recommended if Age 45-80 years, 30 pack-year currently smoking OR have quit w/in 15years. Patient does not qualify. Colorectal: last done 10/21/2014; not recommended due to age        Plan:     Reviewed health maintenance screenings with patient today and relevant education, vaccines, and/or referrals were provided.    Continue doing brain stimulating activities (puzzles, reading, adult coloring books, staying active) to keep memory sharp.    Continue to eat heart healthy diet (full of fruits, vegetables, whole grains, lean protein, water--limit salt, fat, and sugar intake) and increase physical activity as tolerated.   I have personally reviewed and noted the following in the patient's chart:   . Medical and social history . Use of alcohol, tobacco or illicit drugs  . Current medications and supplements . Functional ability and status . Nutritional status . Physical activity . Advanced directives . List of other physicians . Hospitalizations, surgeries, and ER visits in previous 12 months . Vitals . Screenings to include cognitive, depression, and falls . Referrals and appointments  In addition, I have reviewed and discussed with patient certain preventive protocols, quality metrics, and best practice recommendations. A written personalized care plan for preventive services as well as general preventive health recommendations were provided to patient.     Sheral Flow, LPN  [id number] Nurse Health Advisor

## 2019-07-08 ENCOUNTER — Other Ambulatory Visit: Payer: Self-pay

## 2019-07-08 ENCOUNTER — Ambulatory Visit: Payer: Medicare Other | Admitting: Pharmacist

## 2019-07-08 DIAGNOSIS — E785 Hyperlipidemia, unspecified: Secondary | ICD-10-CM

## 2019-07-08 DIAGNOSIS — I10 Essential (primary) hypertension: Secondary | ICD-10-CM

## 2019-07-08 NOTE — Patient Instructions (Addendum)
Visit Information  Thank you for meeting with me to discuss your medications! I look forward to working with you to achieve your health care goals. Below is a summary of what we talked about during the visit:  Goals Addressed            This Visit's Progress   . Cholesterol: goal LDL < 70       CARE PLAN ENTRY (see longitudinal plan of care for additional care plan information)  Current Barriers:  . Uncontrolled hyperlipidemia, complicated by HTN, carotid disease, hx TIA . Current antihyperlipidemic regimen:   atorvastatin 40 mg daily,    omega 3 fish oil OTC . Previous antihyperlipidemic medications tried simvastatin . Most recent lipid panel:     Component Value Date/Time   CHOL 118 10/31/2018 0911   TRIG 59.0 10/31/2018 0911   HDL 46.50 10/31/2018 0911   CHOLHDL 3 10/31/2018 0911   VLDL 11.8 10/31/2018 0911   LDLCALC 59 10/31/2018 0911   . ASCVD risk enhancing conditions: age >20, HTN  Pharmacist Clinical Goal(s):  [name] Over the next 30 days, patient will work with PharmD and providers towards optimized antihyperlipidemic therapy  Interventions: . Comprehensive medication review performed; medication list updated in electronic medical record.  Bertram Savin care team collaboration (see longitudinal plan of care) . Discussed benefits of atorvastatin and clopidogrel in stroke prevention  Patient Self Care Activities:  . Patient will focus on medication adherence by pill box . Patient will continue routine outdoor activity  Initial goal documentation     . Hypertension: BP goal < 130/80       CARE PLAN ENTRY (see longitudinal plan of care for additional care plan information)  Current Barriers:  . Overcontrolled hypertension, complicated by hyperlipidemia, prediabates, carotid disease . Current antihypertensive regimen: amlodipine 5 mg (not taking) . Previous antihypertensives tried: benazepril, metoprolol . Last practice recorded BP readings:  BP Readings  from Last 3 Encounters:  06/18/19 122/80  03/25/19 129/64  01/03/19 124/60   . Current home BP readings: n/a  Pharmacist Clinical Goal(s):  [name] Over the next 30 days, patient will work with PharmD and providers to optimize antihypertensive regimen  Interventions: . Inter-disciplinary care team collaboration (see longitudinal plan of care) . Comprehensive medication review performed; medication list updated in the electronic medical record.  . Discussed orthostatic hypotension - patient reports dizziness upon standing that resolves after a few minutes . Counseled on adequate fluid intake to prevent dizziness and falls . Discussed benefits of checking BP at home . Discussed meclizine as an option to take as needed for dizziness  Patient Self Care Activities:  . Patient will continue to check BP 2-3 times per week , document, and provide at future appointments . Patient will consume at least 64 oz of fluid daily . Patient will stand slowly and wait a few seconds before moving to prevent falls related to dizziness . Patient will focus on medication adherence by pill box . Patient will take meclizine 12.5 mg as needed for dizziness  Initial goal documentation     . Medication Management       CARE PLAN ENTRY  Current Barriers:  . Chronic Disease Management support, education, and care coordination needs related to polypharmacy and medication management . Current medications without benefit per patient report:  o pantoprazole 40 mg BID (pt reports taking once daily) o Gabapentin 100 mg HS  Pharmacist Clinical Goal(s):  [name] Over the next 30 days, patient will work with PharmD and  providers towards optimized reflux therapy  Interventions: . Comprehensive medication review performed. . Discussed indications for pantoprazole (heartburn, reflux) and gabapentin (nerve pain, restless leg syndrome)  o Patient has not been taking gabapentin and denies issues. Removed from med list. o Patient  is interested in doing trial off of pantoprazole . Utilize [facility] for medication synchronization, packaging and delivery  Patient Self Care Activities:  . Patient will stop taking pantoprazole and monitor for return in GERD symptoms o Patient will restart PPI if GERD symptoms arise . Patient will contact providers with new issues or concerns . Patient will work with [facility] to optimize medication management  Initial goal documentation    . Urinary Incontinence: improve symptoms       CARE PLAN ENTRY  Current Barriers:  . Chronic Disease Management support, education, and care coordination needs related to urinary incontinence . Current regimen: Myrbetriq 25 mg daily  Pharmacist Clinical Goal(s):  [name] Over the next 30 days, patient will work with PharmD and providers towards optimized incontinence therapy  Interventions: . Recommended to increase Myrbetriq to 50 mg for additional benefit  Patient Self Care Activities:  . Patient verbalizes understanding of plan to take Myrbetriq 50 mg daily and Self administers medications as prescribed . Patient will monitor for improvement in urinary symptoms  Initial goal documentation       Mr. Robotham was given information about Chronic Care Management services today including:  1. CCM service includes personalized support from designated clinical staff supervised by his physician, including individualized plan of care and coordination with other care providers 2. 24/7 contact phone numbers for assistance for urgent and routine care needs. 3. Standard insurance, coinsurance, copays and deductibles apply for chronic care management only during months in which we provide at least 20 minutes of these services. Most insurances cover these services at 100%, however patients may be responsible for any copay, coinsurance and/or deductible if applicable. This service may help you avoid the need for more expensive face-to-face  services. 4. Only one practitioner may furnish and bill the service in a calendar month. 5. The patient may stop CCM services at any time (effective at the end of the month) by phone call to the office staff.  Patient agreed to services and verbal consent obtained.   Print copy of patient instructions provided.  Telephone follow up appointment with pharmacy team member scheduled for: 1 month  Charlene Brooke, PharmD Clinical Pharmacist [facility] [phone]  Dizziness Dizziness is a common problem. It makes you feel unsteady or light-headed. You may feel like you are about to pass out (faint). Dizziness can lead to getting hurt if you stumble or fall. Dizziness can be caused by many things, including:  Medicines.  Not having enough water in your body (dehydration).  Illness. Follow these instructions at home: Eating and drinking   Drink enough fluid to keep your pee (urine) clear or pale yellow. This helps to keep you from getting dehydrated. Try to drink more clear fluids, such as water.  Do not drink alcohol.  Limit how much caffeine you drink or eat, if your doctor tells you to do that.  Limit how much salt (sodium) you drink or eat, if your doctor tells you to do that. Activity   Avoid making quick movements. ? When you stand up from sitting in a chair, steady yourself until you feel okay. ? In the morning, first sit up on the side of the bed. When  you feel okay, stand slowly while you hold onto something. Do this until you know that your balance is fine.  If you need to stand in one place for a long time, move your legs often. Tighten and relax the muscles in your legs while you are standing.  Do not drive or use heavy machinery if you feel dizzy.  Avoid bending down if you feel dizzy. Place items in your home so you can reach them easily without leaning over. Lifestyle  Do not use any products that contain nicotine or tobacco, such as  cigarettes and e-cigarettes. If you need help quitting, ask your doctor.  Try to lower your stress level. You can do this by using methods such as yoga or meditation. Talk with your doctor if you need help. General instructions  Watch your dizziness for any changes.  Take over-the-counter and prescription medicines only as told by your doctor. Talk with your doctor if you think that you are dizzy because of a medicine that you are taking.  Tell a friend or a family member that you are feeling dizzy. If he or she notices any changes in your behavior, have this person call your doctor.  Keep all follow-up visits as told by your doctor. This is important. Contact a doctor if:  Your dizziness does not go away.  Your dizziness or light-headedness gets worse.  You feel sick to your stomach (nauseous).  You have trouble hearing.  You have new symptoms.  You are unsteady on your feet.  You feel like the room is spinning. Get help right away if:  You throw up (vomit) or have watery poop (diarrhea), and you cannot eat or drink anything.  You have trouble: ? Talking. ? Walking. ? Swallowing. ? Using your arms, hands, or legs.  You feel generally weak.  You are not thinking clearly, or you have trouble forming sentences. A friend or family member may notice this.  You have: ? Chest pain. ? Pain in your belly (abdomen). ? Shortness of breath. ? Sweating.  Your vision changes.  You are bleeding.  You have a very bad headache.  You have neck pain or a stiff neck.  You have a fever. These symptoms may be an emergency. Do not wait to see if the symptoms will go away. Get medical help right away. Call your local emergency services (911 in the [place] Do not drive yourself to the hospital. Summary  Dizziness makes you feel unsteady or light-headed. You may feel like you are about to pass out (faint).  Drink enough fluid to keep your pee (urine) clear or pale yellow. Do not  drink alcohol.  Avoid making quick movements if you feel dizzy.  Watch your dizziness for any changes. This information is not intended to replace advice given to you by your health care provider. Make sure you discuss any questions you have with your health care provider. Document Revised: 03/02/2017 Document Reviewed: 03/16/2016 Elsevier Patient Education  [place].

## 2019-07-08 NOTE — Chronic Care Management (AMB) (Signed)
Chronic Care Management Pharmacy  Name: Ronald Stafford  MRN: [id number] DOB: Aug 27, 1930  Chief Complaint/ HPI  Ronald Stafford,  86 y.o. , male presents for their Initial CCM visit with the clinical pharmacist via telephone due to COVID-19 Pandemic.  PCP : Ronald Rail, MD  Their chronic conditions include: HTN, Hx TIA, carotid disease, HLD, GERD, osteoarthritis, urinary incontinence  Patient lives alone, wife died 5 years ago. 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 Currently spends most of his time working in his yard and eats with his girlfriend, his friends, and family several nights per week.  Office Visits: 01/03/19 Dr Ronald Stafford OV: lightheadendess upon standing. Decrease amlodipine to 5 mg daily, increase fluids. Shoulder pain - Xray shows moderate arthritis. Rec'd topical arthritis cream.   11/19/18 NP Ronald Stafford OV: mechanical fall, twisted ankle  10/31/18 Dr Ronald Stafford OV: urinary incontinence, previous prostatectomy, wears, rx Myrbetriq.   Consult Visit: 03/25/19 Dr Ronald Stafford (vasc surg): very active, pt has normal arterial flow, had home health screen that was false positive.   09/25/18 urgent care visit: avulsion of skin over elbow, wound care only.   Medications: Outpatient Encounter Medications as of 07/08/2019  Medication Sig  . acetaminophen (TYLENOL) 325 MG tablet Take 650 mg by mouth every 6 (six) hours as needed for moderate pain or headache.   [name] atorvastatin (LIPITOR) 40 MG tablet Take 1 tablet (40 mg total) by mouth daily. Need office visit for more refills.  . clopidogrel (PLAVIX) 75 MG tablet Take 1 tablet by mouth once daily  . mirabegron ER (MYRBETRIQ) 25 MG TB24 tablet Take 1 tablet (25 mg total) by mouth daily.  . Omega-3 Fatty Acids (FISH OIL) 500 MG CAPS Take by mouth.  . pantoprazole (PROTONIX) 40 MG tablet TAKE 1 TABLET BY MOUTH TWICE DAILY  . vitamin C (ASCORBIC ACID) 250 MG tablet Take 250 mg by mouth daily.    [name] amLODipine (NORVASC) 10 MG tablet Take 1 tablet by mouth once daily  . amLODipine (NORVASC) 5 MG tablet Take 5 mg by mouth daily.  [name] gabapentin (NEURONTIN) 100 MG capsule Take 1 capsule by mouth once daily at bedtime (Patient not taking: Reported on 07/08/2019)   No facility-administered encounter medications on file as of 07/08/2019.     Current Diagnosis/Assessment:  SDOH Interventions     Most Recent Value  SDOH Interventions  SDOH Interventions for the Following Domains  Transportation  Transportation Interventions  Other (Comment) [no transportation needs identified]     Goals Addressed            This Visit's Progress   . Cholesterol: goal LDL < 70       CARE PLAN ENTRY (see longitudinal plan of care for additional care plan information)  Current Barriers:  . Uncontrolled hyperlipidemia, complicated by HTN, carotid disease, hx TIA . Current antihyperlipidemic regimen:   atorvastatin 40 mg daily,    omega 3 fish oil OTC . Previous antihyperlipidemic medications tried simvastatin . Most recent lipid panel:     Component Value Date/Time   CHOL 118 10/31/2018 0911   TRIG 59.0 10/31/2018 0911   HDL 46.50 10/31/2018 0911   CHOLHDL 3 10/31/2018 0911   VLDL 11.8 10/31/2018 0911   LDLCALC 59 10/31/2018 0911   . ASCVD risk enhancing conditions: age >48, HTN  Pharmacist Clinical Goal(s):  [name] Over the next 30 days, patient will work with PharmD and providers towards optimized antihyperlipidemic therapy  Interventions: . Comprehensive medication review performed; medication list updated in electronic medical record.  Ronald Stafford care team collaboration (see longitudinal plan of care) . Discussed benefits of atorvastatin and clopidogrel in stroke prevention  Patient Self Care Activities:  . Patient will focus on medication adherence by pill box . Patient will continue routine outdoor activity  Initial goal documentation     . Hypertension: BP goal <  130/80       CARE PLAN ENTRY (see longitudinal plan of care for additional care plan information)  Current Barriers:  . Overcontrolled hypertension, complicated by hyperlipidemia, prediabates, carotid disease . Current antihypertensive regimen: amlodipine 5 mg (not taking) . Previous antihypertensives tried: benazepril, metoprolol . Last practice recorded BP readings:  BP Readings from Last 3 Encounters:  06/18/19 122/80  03/25/19 129/64  01/03/19 124/60   . Current home BP readings: n/a  Pharmacist Clinical Goal(s):  [name] Over the next 30 days, patient will work with PharmD and providers to optimize antihypertensive regimen  Interventions: . Inter-disciplinary care team collaboration (see longitudinal plan of care) . Comprehensive medication review performed; medication list updated in the electronic medical record.  . Discussed orthostatic hypotension - patient reports dizziness upon standing that resolves after a few minutes . Counseled on adequate fluid intake to prevent dizziness and falls . Discussed benefits of checking BP at home . Discussed meclizine as an option to take as needed for dizziness  Patient Self Care Activities:  . Patient will continue to check BP 2-3 times per week , document, and provide at future appointments . Patient will consume at least 64 oz of fluid daily . Patient will stand slowly and wait a few seconds before moving to prevent falls related to dizziness . Patient will focus on medication adherence by pill box . Patient will take meclizine 12.5 mg as needed for dizziness  Initial goal documentation     . Medication Management       CARE PLAN ENTRY  Current Barriers:  . Chronic Disease Management support, education, and care coordination needs related to polypharmacy and medication management . Current medications without benefit per patient report:  o pantoprazole 40 mg BID (pt reports taking once daily) o Gabapentin 100 mg HS  Pharmacist  Clinical Goal(s):  [name] Over the next 30 days, patient will work with PharmD and providers towards optimized reflux therapy  Interventions: . Comprehensive medication review performed. . Discussed indications for pantoprazole (heartburn, reflux) and gabapentin (nerve pain, restless leg syndrome)  o Patient has not been taking gabapentin and denies issues. Removed from med list. o Patient is interested in doing trial off of pantoprazole . Utilize [facility] for medication synchronization, packaging and delivery  Patient Self Care Activities:  . Patient will stop taking pantoprazole and monitor for return in GERD symptoms o Patient will restart PPI if GERD symptoms arise . Patient will contact providers with new issues or concerns . Patient will work with [facility] to optimize medication management  Initial goal documentation    . Urinary Incontinence: improve symptoms       CARE PLAN ENTRY  Current Barriers:  . Chronic Disease Management support, education, and care coordination needs related to urinary incontinence . Current regimen: Myrbetriq 25 mg daily  Pharmacist Clinical Goal(s):  [name] Over the next 30 days, patient will work with PharmD and providers towards optimized incontinence therapy  Interventions: . Recommended to increase Myrbetriq to 50 mg for additional benefit  Patient Self Care Activities:  . Patient verbalizes  understanding of plan to take Myrbetriq 50 mg daily and Self administers medications as prescribed . Patient will monitor for improvement in urinary symptoms  Initial goal documentation       Hypertension   Office blood pressures are  BP Readings from Last 3 Encounters:  06/18/19 122/80  03/25/19 129/64  01/03/19 124/60   Kidney Function Lab Results  Component Value Date   CREATININE 0.96 10/31/2018   CREATININE 0.92 01/21/2018   CREATININE 0.87 12/14/2017      Component Value Date/Time   GFR 74.28 10/31/2018 0911   GFRNONAA  >60 02/23/2017 0430   GFRAA >60 02/23/2017 0430    Patient has failed these meds in the past: benazepril, metoprolol Patient is currently controlled on the following medications: no medications  Patient checks BP at home infrequently  Patient home BP readings are ranging: n/a  We discussed: pt is prescribed amlodipine 5 mg, which was reduced from 10 mg in Oct 2020 due to lightheadedness upon standing. Per dispense report last filled dose was 10 mg, however pt reports he has not been taking any BP meds for several weeks. He reports reduction in dizziness upon standing since stopping amlodipine, but he does still have dizziness occasionally. Discussed orthostasis and counseled patient on adequate fluid intake and standing slowly to prevent falls.  Plan  Patient will check BP at home for 1 month     Hyperlipidemia   Lipid Panel     Component Value Date/Time   CHOL 118 10/31/2018 0911   TRIG 59.0 10/31/2018 0911   HDL 46.50 10/31/2018 0911   CHOLHDL 3 10/31/2018 0911   VLDL 11.8 10/31/2018 0911   LDLCALC 59 10/31/2018 0911   Hx of TIA and carotid artery disease.  Patient has failed these meds in past: simvastatin Patient is currently controlled on the following medications:   atorvastatin 40 mg daily,   clopidogrel 75 mg daily,   omega 3 fish oil OTC  We discussed:  Indications for atorvastatin and clopidogrel (hx TIA, carotid artery disease), benefits. Encouraged continued active lifestyle.  Plan  Continue current medications and control with diet and exercise   GERD   Patient has failed these meds in past: n/a Patient is currently controlled on the following medications:   Pantoprazole 40 mg once daily  We discussed:  Pt denies heartburn, reports he does not remember last time he had reflux issues. Suggested trial off of pantoprazole to see if he still needs daily PPI, pt agreed.  Plan  Trial off of pantoprazole, pt to monitor for symptoms of  reflux  Incontinence   Patient has failed these meds in past: n/a Patient is currently controlled on the following medications:   Mirabegron ER 25 mg daily  We discussed:  Pt noticed an improvement while taking the med, he still does have some leakage and in fact has reduced fluid consumption due to this. Discussed higher dose of Myrbetriq may further improve symptoms, kidney function and BP are adequate to tolerate higher dose, pt would like to try 50 mg.  Plan  Recommend to increase Myrbetriq to 50 mg for additional benefit  Arthritis/Pain   Patient has failed these meds in past: n/a Patient is currently controlled on the following medications:   Acetaminophen 650 mg q6h PRN  We discussed:  Pt takes Tylenol infrequently, and it helps with hip pain. He does not recognize gabapentin by name and denies nerve pain, RLS - he reports he has not been taking gabapentin and does not  need it.  Plan  Remove gabapentin from med list as patient is not taking and denies issues   Health Maintenance   Patient is currently controlled on the following medications:   Vitamin C  We discussed:  Patient is satisfied with current OTC regimen and denies issues  Plan  Continue current medications   Medication Management   Pt uses [facility] for all medications Uses pill box? Yes Pt endorses 100% compliance  We discussed: Verbal consent obtained for [facility] enhanced pharmacy services (medication synchronization, adherence packaging, delivery coordination). A medication sync plan was created to allow patient to get all medications delivered once every 30 to 90 days per patient preference. Patient understands they have freedom to choose pharmacy and clinical pharmacist will coordinate care between all prescribers and [facility].  Plan  Utilize [facility] for medication synchronization, packaging and delivery    Follow up: 1 month phone visit  Charlene Brooke, PharmD Clinical Pharmacist [facility] [phone]

## 2019-07-09 ENCOUNTER — Telehealth: Payer: Self-pay

## 2019-07-09 NOTE — Telephone Encounter (Signed)
error 

## 2019-07-09 NOTE — Progress Notes (Signed)
Subjective:    Patient ID: Ronald Stafford, male    DOB: November 30, 1930, 86 y.o.   MRN: [id number]  HPI The patient is here for follow up of their chronic medical problems, including htn, DM, h/o prostate ca with urinary leakage, prediabetes   He would like to increase the myrbetriq.  The 25 mg does help, but he still has some leakage.  He is hoping the higher dose will help further..   He was having lightheadedness and stopped the norvasc.  He still has it.   He feels it when he first stands up only.  It lasts about 2 minutes.  He does not feel it in any other circumstances.   Tingling in left hand.  He can feel it in his right sholder.  When he lays on it he can feel the numbness in his left hand.  Pain in shoulder once in while.  No dec ROM.     Medications and allergies reviewed with patient and updated if appropriate.  Patient Active Problem List   Diagnosis Date Noted  . Lightheadedness 01/03/2019  . Urinary incontinence 10/31/2018  . Left-sided chest wall pain 06/04/2018  . Numbness and tingling of right hand 08/10/2017  . History of total hip arthroplasty 03/28/2017  . Symptomatic anemia 02/22/2017  . Failed total hip arthroplasty ([place]) 02/14/2017  . Osteoarthritis of left hip 12/10/2016  . Acute right ankle pain 11/07/2016  . Acute pain of left shoulder 10/04/2016  . Chest pain 07/30/2016  . History of DVT (deep vein thrombosis) 07/30/2016  . TIA (transient ischemic attack) 07/30/2016  . Thrombocytopenia ([place]) 07/30/2016  . Carotid arterial disease ([place]) 07/20/2016  . Right sided weakness 06/27/2016  . Slurred speech 06/27/2016  . Left wrist pain 08/11/2015  . Frequent headaches 06/22/2015  . Prediabetes 02/11/2015  . Peroneal tendonitis of right lower extremity 01/12/2014  . Mass of right ankle 12/31/2013  . Right ankle instability 12/31/2013  . Radial head fracture, closed 09/18/2013  . Solitary pulmonary nodule 08/08/2012  . SPINAL STENOSIS, LUMBAR 04/06/2010  .  LOW BACK PAIN SYNDROME 11/02/2009  . FOOT DROP, RIGHT 05/21/2008  . Hyperlipidemia 12/25/2006  . Essential hypertension 12/25/2006  . GERD 12/25/2006  . PROSTATE CANCER, HX OF 12/25/2006  . POLYP, COLON 05/02/2006    Current Outpatient Medications on File Prior to Visit  Medication Sig Dispense Refill  . acetaminophen (TYLENOL) 325 MG tablet Take 650 mg by mouth every 6 (six) hours as needed for moderate pain or headache.     . clopidogrel (PLAVIX) 75 MG tablet Take 1 tablet by mouth once daily 90 tablet 0  . Omega-3 Fatty Acids (FISH OIL) 500 MG CAPS Take by mouth.    . pantoprazole (PROTONIX) 40 MG tablet TAKE 1 TABLET BY MOUTH TWICE DAILY 60 tablet 0  . vitamin C (ASCORBIC ACID) 250 MG tablet Take 250 mg by mouth daily.     No current facility-administered medications on file prior to visit.    Past Medical History:  Diagnosis Date  . Cancer [facility]) 2004   prostate cancer, Dr. Risa Grill  . Colon polyps   . Deep venous thrombosis ([place]) 1997   Postop  . Depression   . GERD (gastroesophageal reflux disease)   . Hyperlipidemia   . Hypertension   . Shoulder fracture, left 02/23/2011   immobilized in sling , Dr Norris(no surgery)    Past Surgical History:  Procedure Laterality Date  . COLONOSCOPY  2006   Negative,Dr.Edwards  .  colonoscopy with polypectomy      PMH of  . ESOPHAGOGASTRODUODENOSCOPY N/A 02/23/2017   Procedure: ESOPHAGOGASTRODUODENOSCOPY (EGD);  Surgeon: Otis Brace, MD;  Location: [facility];  Service: Gastroenterology;  Laterality: N/A;  . JOINT REPLACEMENT  1997, 2001   Total Hip replacement X 2  . LUMBAR SPINE SURGERY  09/28/2010   Dr Rolena Infante; for spinal stenosis  . MECKEL DIVERTICULUM EXCISION     Age 8  . PROSTATECTOMY  2004   w/ radiation, Dr Risa Grill  . TOTAL HIP REVISION Left 02/14/2017   Procedure: Left hip polyethylene EXCHANGE;  Surgeon: Gaynelle Arabian, MD;  Location: [facility];  Service: Orthopedics;  Laterality: Left;  [name] VASECTOMY       Social History   Socioeconomic History  . Marital status: Widowed    Spouse name: Not on file  . Number of children: 2  . Years of education: Not on file  . Highest education level: Not on file  Occupational History  . Occupation: retired  Tobacco Use  . Smoking status: Former Smoker    Packs/day: 0.50    Years: 5.00    Pack years: 2.50    Types: Cigarettes    Quit date: 03/13/1988    Years since quitting: 31.3  . Smokeless tobacco: Former Systems developer  . Tobacco comment: Quit at about age 51 / chewed tobacca x 1 year  Substance and Sexual Activity  . Alcohol use: No    Alcohol/week: 0.0 standard drinks  . Drug use: No  . Sexual activity: Never  Other Topics Concern  . Not on file  Social History Narrative   Lives alone; Wife passed 2012/12/27;   Died of ovarian cancer; breast cancer; other cancer;      Social Determinants of Health   Financial Resource Strain:   . Difficulty of Paying Living Expenses:   Food Insecurity:   . Worried About Charity fundraiser in the Last Year:   . Arboriculturist in the Last Year:   Transportation Needs: No Transportation Needs  . Lack of Transportation (Medical): No  . Lack of Transportation (Non-Medical): No  Physical Activity:   . Days of Exercise per Week:   . Minutes of Exercise per Session:   Stress:   . Feeling of Stress :   Social Connections:   . Frequency of Communication with Friends and Family:   . Frequency of Social Gatherings with Friends and Family:   . Attends Religious Services:   . Active Member of Clubs or Organizations:   . Attends Archivist Meetings:   [name] Marital Status:     Family History  Problem Relation Age of Onset  . Prostate cancer Father   . Testicular cancer Son     Review of Systems  Constitutional: Negative for fever.  Respiratory: Negative for cough, shortness of breath and wheezing.   Cardiovascular: Negative for chest pain, palpitations and leg swelling.  Neurological: Positive  for light-headedness and numbness (tingling in right hand). Negative for dizziness, weakness and headaches.       Objective:   Vitals:   07/10/19 0856  BP: (!) 152/62  Pulse: 94  Resp: 16  Temp: 98.6 F (37 C)  SpO2: 97%   BP Readings from Last 3 Encounters:  07/10/19 (!) 152/62  06/18/19 122/80  03/25/19 129/64   Wt Readings from Last 3 Encounters:  07/10/19 193 lb 6.4 oz (87.7 kg)  06/18/19 192 lb 12.8 oz (87.5 kg)  03/25/19 185 lb (  83.9 kg)   Body mass index is 24.83 kg/m.   Physical Exam    Constitutional: Appears well-developed and well-nourished. No distress.  HENT:  Head: Normocephalic and atraumatic.  Neck: Neck supple. No tracheal deviation present. No thyromegaly present.  No cervical lymphadenopathy Cardiovascular: Normal rate, regular rhythm and normal heart sounds.   No murmur heard. No carotid bruit .  No edema Pulmonary/Chest: Effort normal and breath sounds normal. No respiratory distress. No has no wheezes. No rales.  Skin: Skin is warm and dry. Not diaphoretic.  Psychiatric: Normal mood and affect. Behavior is normal.      Assessment & Plan:    See Problem List for Assessment and Plan of chronic medical problems.    This visit occurred during the SARS-CoV-2 public health emergency.  Safety protocols were in place, including screening questions prior to the visit, additional usage of staff PPE, and extensive cleaning of exam room while observing appropriate contact time as indicated for disinfecting solutions.

## 2019-07-09 NOTE — Addendum Note (Signed)
Addended by: Aviva Signs M on: 07/09/2019 01:40 PM   Modules accepted: Orders

## 2019-07-10 ENCOUNTER — Ambulatory Visit (INDEPENDENT_AMBULATORY_CARE_PROVIDER_SITE_OTHER): Payer: Medicare Other | Admitting: Internal Medicine

## 2019-07-10 ENCOUNTER — Other Ambulatory Visit: Payer: Self-pay

## 2019-07-10 ENCOUNTER — Encounter: Payer: Self-pay | Admitting: Internal Medicine

## 2019-07-10 VITALS — BP 152/62 | HR 94 | Temp 98.6°F | Resp 16 | Ht 74.0 in | Wt 193.4 lb

## 2019-07-10 DIAGNOSIS — M25511 Pain in right shoulder: Secondary | ICD-10-CM | POA: Diagnosis not present

## 2019-07-10 DIAGNOSIS — R32 Unspecified urinary incontinence: Secondary | ICD-10-CM | POA: Diagnosis not present

## 2019-07-10 DIAGNOSIS — R42 Dizziness and giddiness: Secondary | ICD-10-CM

## 2019-07-10 DIAGNOSIS — I10 Essential (primary) hypertension: Secondary | ICD-10-CM | POA: Diagnosis not present

## 2019-07-10 DIAGNOSIS — E7849 Other hyperlipidemia: Secondary | ICD-10-CM

## 2019-07-10 DIAGNOSIS — R7303 Prediabetes: Secondary | ICD-10-CM | POA: Diagnosis not present

## 2019-07-10 MED ORDER — MIRABEGRON ER 50 MG PO TB24: 50.0000 mg | ORAL_TABLET | Freq: Every day | ORAL | 1 refills | Status: DC

## 2019-07-10 MED ORDER — ATORVASTATIN CALCIUM 40 MG PO TABS: 40.0000 mg | ORAL_TABLET | Freq: Every day | ORAL | 1 refills | Status: DC

## 2019-07-10 NOTE — Assessment & Plan Note (Signed)
Chronic Eating healthy - eats what he wants Sugars stable in prediabetic range Very active Will check a1c at next visit since low risk of progression to DM

## 2019-07-10 NOTE — Assessment & Plan Note (Signed)
Chronic Has had this for a while Only when he first stands up - can last 2 minutes Does not feel it any other time No improvement after stopping norvasc He can tolerate it - he always stands still after standing and waits for it to subside before walking No associated symptoms He will continue above and we will monitor

## 2019-07-10 NOTE — Assessment & Plan Note (Signed)
Acute Intermittent pain Has associated right hand tingling,  No weakness No dec ROM of shoulder Consider sports med evaluation

## 2019-07-10 NOTE — Assessment & Plan Note (Signed)
Chronic Improved with myrbetriq 25 mg daily Will try 50 mg daily

## 2019-07-10 NOTE — Patient Instructions (Addendum)
    Medications reviewed and updated.  Changes include :   Increase myrbetriq to 50 mg  Your prescription(s) have been submitted to your pharmacy. Please take as directed and contact our office if you believe you are having problem(s) with the medication(s).    Please followup in 6 months

## 2019-07-10 NOTE — Assessment & Plan Note (Signed)
Chronic Continue daily statin Regular exercise and healthy diet encouraged  

## 2019-07-10 NOTE — Assessment & Plan Note (Signed)
Chronic He stopped norvasc due to orthostatic lightheadedness but it has not improved Will hold anti-htn medications for now BP is acceptable for age and I donot want to make his lightheadedness worse Monitor BP off medication

## 2019-07-11 ENCOUNTER — Telehealth: Payer: Self-pay

## 2019-07-11 NOTE — Telephone Encounter (Signed)
-----   Message from Binnie Rail, MD sent at 07/09/2019  7:50 AM EDT ----- Regarding: FW: Med Updates He is due for a routine f/u with me - can you call him to come in  ----- Message ----- From: Charlton Haws, [facility] Sent: 07/08/2019  12:58 PM EDT To: Binnie Rail, MD Subject: Med Updates                                    Mr Kratzke still has issues with dizziness, he told me he falls about once a week - sometimes due to dizziness, sometimes mechanical. He has never hit his head and typically catches himself. It sounds orthostatic since it typically happens when he stands up. He has already stopped taking amlodipine (I've asked him to check BP at home for a month to see if he needs BP med) so I counseled him about fluid intake - he is hesitant to increase fluids due to incontinence issues...  He has noticed improvement in urinary symptoms with Myrbetriq 25 mg but still having leakage, I suggested higher dose of Myrbetriq 50 mg may have additional benefit. He is out of Myrbetriq, can you send in 50 mg dose to [facility]?  Also related to his dizziness - we talked about meclizine, I'm not sure if it would help very much but may be worth a shot. Also let me know if you'd like to see him about the dizziness/falls.

## 2019-07-11 NOTE — Telephone Encounter (Signed)
LVM for pt to call back to schedule a follow up.

## 2019-07-22 ENCOUNTER — Ambulatory Visit: Payer: Medicare Other | Admitting: Internal Medicine

## 2019-08-06 ENCOUNTER — Other Ambulatory Visit: Payer: Self-pay

## 2019-08-06 ENCOUNTER — Ambulatory Visit: Payer: Medicare Other | Admitting: Pharmacist

## 2019-08-06 ENCOUNTER — Other Ambulatory Visit: Payer: Self-pay | Admitting: Internal Medicine

## 2019-08-06 DIAGNOSIS — R32 Unspecified urinary incontinence: Secondary | ICD-10-CM

## 2019-08-06 DIAGNOSIS — E7849 Other hyperlipidemia: Secondary | ICD-10-CM

## 2019-08-06 DIAGNOSIS — K219 Gastro-esophageal reflux disease without esophagitis: Secondary | ICD-10-CM

## 2019-08-06 DIAGNOSIS — I10 Essential (primary) hypertension: Secondary | ICD-10-CM

## 2019-08-06 MED ORDER — CLOPIDOGREL BISULFATE 75 MG PO TABS: 75.0000 mg | ORAL_TABLET | Freq: Every day | ORAL | 0 refills | Status: DC

## 2019-08-06 MED ORDER — ATORVASTATIN CALCIUM 40 MG PO TABS: 40.0000 mg | ORAL_TABLET | Freq: Every day | ORAL | 1 refills | Status: DC

## 2019-08-06 MED ORDER — MIRABEGRON ER 50 MG PO TB24: 50.0000 mg | ORAL_TABLET | Freq: Every day | ORAL | 1 refills | Status: DC

## 2019-08-06 NOTE — Chronic Care Management (AMB) (Signed)
Chronic Care Management Pharmacy  Name: Ronald Stafford  MRN: [id number] DOB: [birth year]  Chief Complaint/ HPI  Olene Craven Hausen,  86 y.o. , male presents for their Initial CCM visit with the clinical pharmacist via telephone due to COVID-19 Pandemic.  PCP : Binnie Rail, MD  Their chronic conditions include: HTN, Hx TIA, carotid disease, HLD, GERD, osteoarthritis, urinary incontinence  Patient lives alone, wife died 5 years ago. 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 Currently spends most of his time working in his yard and eats with his girlfriend, his friends, and family several nights per week.  Office Visits: 01/03/19 Dr Quay Burow OV: lightheadendess upon standing. Decrease amlodipine to 5 mg daily, increase fluids. Shoulder pain - Xray shows moderate arthritis. Rec'd topical arthritis cream.   11/19/18 NP Jodi Mourning OV: mechanical fall, twisted ankle  10/31/18 Dr Quay Burow OV: urinary incontinence, previous prostatectomy, wears, rx Myrbetriq.   Consult Visit: 03/25/19 Dr Donnetta Hutching (vasc surg): very active, pt has normal arterial flow, had home health screen that was false positive.   09/25/18 urgent care visit: avulsion of skin over elbow, wound care only.   Medications: Outpatient Encounter Medications as of 08/06/2019  Medication Sig  . acetaminophen (TYLENOL) 325 MG tablet Take 650 mg by mouth every 6 (six) hours as needed for moderate pain or headache.   [name] atorvastatin (LIPITOR) 40 MG tablet Take 1 tablet (40 mg total) by mouth daily.  . clopidogrel (PLAVIX) 75 MG tablet Take 1 tablet (75 mg total) by mouth daily.  . mirabegron ER (MYRBETRIQ) 50 MG TB24 tablet Take 1 tablet (50 mg total) by mouth daily.  . Omega-3 Fatty Acids (FISH OIL) 500 MG CAPS Take by mouth.  . vitamin C (ASCORBIC ACID) 250 MG tablet Take 250 mg by mouth daily.  . [DISCONTINUED] atorvastatin (LIPITOR) 40 MG tablet Take 1 tablet (40 mg total) by mouth daily.  .  [DISCONTINUED] mirabegron ER (MYRBETRIQ) 50 MG TB24 tablet Take 1 tablet (50 mg total) by mouth daily.  . pantoprazole (PROTONIX) 40 MG tablet TAKE 1 TABLET BY MOUTH TWICE DAILY (Patient not taking: Reported on 08/06/2019)  . [DISCONTINUED] clopidogrel (PLAVIX) 75 MG tablet Take 1 tablet by mouth once daily   No facility-administered encounter medications on file as of 08/06/2019.     Current Diagnosis/Assessment:  SDOH Interventions     Most Recent Value  SDOH Interventions  Social Connections Interventions  Intervention Not Indicated     SDOH Interventions     Most Recent Value  SDOH Interventions  Social Connections Interventions  Intervention Not Indicated     Goals Addressed            This Visit's Progress   . Pharmacy Care Plan       CARE PLAN ENTRY  Current Barriers:  . Chronic Disease Management support, education, and care coordination needs related to Hypertension, Hyperlipidemia, History of TIA, GERD and Urinary incontinence   Hypertension BP Readings from Last 3 Encounters:  07/10/19 (!) 152/62  06/18/19 122/80  03/25/19 129/64 .  Pharmacist Clinical Goal(s): o Over the next 30 days, patient will work with PharmD and providers to maintain BP goal <140/90 . Current regimen:  o No medications . Interventions: o Discussed BP goal and importance of monitoring at home o Discussed importance of hydration to maintain BP and prevent dizziness . Patient self care activities - Over the next 30 days, patient will: o Check BP 3-5  times weekly document, and provide at future appointments o Ensure daily salt intake < 2300 mg/day  Hyperlipidemia/History of TIA and carotid artery disease Lipid Panel     Component Value Date/Time   CHOL 118 10/31/2018 0911   TRIG 59.0 10/31/2018 0911   HDL 46.50 10/31/2018 0911   LDLCALC 59 10/31/2018 0911 .  Pharmacist Clinical Goal(s): o Over the next 30 days, patient will work with PharmD and providers to maintain LDL goal <  70 . Current regimen:  o atorvastatin 40 mg daily,  o clopidogrel 75 mg daily,  o omega 3 fish oil OTC . Interventions: o Discussed cholesterol goal and benefit of medications for prevention of heart attack and stroke . Patient self care activities - Over the next 30 days, patient will: o Continue medication as prescribed o Continue low cholesterol diet and exercise routine  GERD . Pharmacist Clinical Goal(s) o Over the next 30 days, patient will work with PharmD and providers to optimize antiacid regimen . Current regimen:  o Pantoprazole 40 mg BID . Interventions: o Recommended tapering pantoprazole to 40 mg daily x 1 week then stop . Patient self care activities - Over the next 30 days, patient will: o Taper pantoprazole as above and monitor symptoms of heartburn/reflux  Urinary Incontinence . Pharmacist Clinical Goal(s) o Over the next 30 days, patient will work with PharmD and providers to optimize incontinence regimen . Current regimen:  o Mirabegron (Myrbetriq) 50 mg daily . Interventions: o Discussed higher dose of Myrbetriq may offer more benefit . Patient self care activities - Over the next 30 days, patient will: o Continue medication as directed  Medication management . Pharmacist Clinical Goal(s): o Over the next 30 days, patient will work with PharmD and providers to achieve optimal medication adherence . Current pharmacy: [facility] . Interventions o Comprehensive medication review performed. o Utilize [facility] pharmacy for medication synchronization, packaging and delivery . Patient self care activities - Over the next 30 days, patient will: o Focus on medication adherence by patient report o Take medications as prescribed o Report any questions or concerns to PharmD and/or provider(s)  Initial goal documentation       Hypertension   Office blood pressures are  BP Readings from Last 3 Encounters:  07/10/19 (!) 152/62  06/18/19 122/80  03/25/19  129/64   Kidney Function Lab Results  Component Value Date/Time   CREATININE 0.96 10/31/2018 09:11 AM   CREATININE 0.92 01/21/2018 11:06 AM   CREATININE 1.88 (H) 03/05/2012 02:13 PM   GFR 74.28 10/31/2018 09:11 AM   GFRNONAA >60 02/23/2017 04:30 AM   GFRAA >60 02/23/2017 04:30 AM   Patient has failed these meds in the past: benazepril, metoprolol Patient is currently controlled on the following medications: no medications  Patient checks BP at home infrequently  Patient home BP readings are ranging: n/a  We discussed: pt is no longer taking amlodipine, not checking BP at home. Discussed importance of monitoring BP from home.  Plan  Continue control with diet and exercise Check BP at home 3-5x per week    Hyperlipidemia   Lipid Panel     Component Value Date/Time   CHOL 118 10/31/2018 0911   TRIG 59.0 10/31/2018 0911   HDL 46.50 10/31/2018 0911   CHOLHDL 3 10/31/2018 0911   VLDL 11.8 10/31/2018 0911   LDLCALC 59 10/31/2018 0911   Hx of TIA and carotid artery disease.  Patient has failed these meds in past: simvastatin Patient is currently controlled on the  following medications:   atorvastatin 40 mg daily,   clopidogrel 75 mg daily,   omega 3 fish oil OTC  We discussed:  Indications for atorvastatin and clopidogrel (hx TIA, carotid artery disease), benefits. Encouraged continued active lifestyle.  Plan  Continue current medications and control with diet and exercise   GERD   Patient has failed these meds in past: n/a Patient is currently controlled on the following medications:   Pantoprazole 40 mg once daily  We discussed:  Pt denies heartburn, reports he does not remember last time he had reflux issues. Suggested trial off of pantoprazole to see if he still needs daily PPI, pt agreed.  Plan  Trial off of pantoprazole, pt to monitor for symptoms of reflux  Incontinence   Patient has failed these meds in past: n/a Patient is currently controlled  on the following medications:   Mirabegron ER 50 mg daily  We discussed:  Pt is doing well on higher dose  Plan  Continue current medications  Arthritis/Pain   Patient has failed these meds in past: gabapentin Patient is currently controlled on the following medications:   Acetaminophen 650 mg q6h PRN  We discussed:  Pt takes Tylenol infrequently, and it helps with hip pain. He does not recognize gabapentin by name and denies nerve pain, RLS - he reports he has not been taking gabapentin and does not need it.  Plan  Continue current medications   Health Maintenance   Patient is currently controlled on the following medications:   Vitamin C  We discussed:  Patient is satisfied with current OTC regimen and denies issues  Plan  Continue current medications   Medication Management   Pt uses [facility] for all medications Uses pill box? Yes Pt endorses 100% compliance  We discussed: Pt will start refilling meds at [facility] this month  Plan  Utilize [facility] pharmacy for medication synchronization, packaging and delivery    Follow up: 1 month phone visit  Charlene Brooke, PharmD Clinical Pharmacist [facility] at [facility] [phone]

## 2019-08-06 NOTE — Patient Instructions (Addendum)
Visit Information  Goals Addressed            This Visit's Progress   . Pharmacy Care Plan       CARE PLAN ENTRY  Current Barriers:  . Chronic Disease Management support, education, and care coordination needs related to Hypertension, Hyperlipidemia, History of TIA, GERD and Urinary incontinence   Hypertension BP Readings from Last 3 Encounters:  07/10/19 (!) 152/62  06/18/19 122/80  03/25/19 129/64 .  Pharmacist Clinical Goal(s): o Over the next 30 days, patient will work with PharmD and providers to maintain BP goal <140/90 . Current regimen:  o No medications . Interventions: o Discussed BP goal and importance of monitoring at home o Discussed importance of hydration to maintain BP and prevent dizziness . Patient self care activities - Over the next 30 days, patient will: o Check BP 3-5 times weekly document, and provide at future appointments o Ensure daily salt intake < 2300 mg/day  Hyperlipidemia/History of TIA and carotid artery disease Lipid Panel     Component Value Date/Time   CHOL 118 10/31/2018 0911   TRIG 59.0 10/31/2018 0911   HDL 46.50 10/31/2018 0911   LDLCALC 59 10/31/2018 0911 .  Pharmacist Clinical Goal(s): o Over the next 30 days, patient will work with PharmD and providers to maintain LDL goal < 70 . Current regimen:  o atorvastatin 40 mg daily,  o clopidogrel 75 mg daily,  o omega 3 fish oil OTC . Interventions: o Discussed cholesterol goal and benefit of medications for prevention of heart attack and stroke . Patient self care activities - Over the next 30 days, patient will: o Continue medication as prescribed o Continue low cholesterol diet and exercise routine  GERD . Pharmacist Clinical Goal(s) o Over the next 30 days, patient will work with PharmD and providers to optimize antiacid regimen . Current regimen:  o Pantoprazole 40 mg BID . Interventions: o Recommended tapering pantoprazole to 40 mg daily x 1 week then stop . Patient  self care activities - Over the next 30 days, patient will: o Taper pantoprazole as above and monitor symptoms of heartburn/reflux  Urinary Incontinence . Pharmacist Clinical Goal(s) o Over the next 30 days, patient will work with PharmD and providers to optimize incontinence regimen . Current regimen:  o Mirabegron (Myrbetriq) 50 mg daily . Interventions: o Discussed higher dose of Myrbetriq may offer more benefit . Patient self care activities - Over the next 30 days, patient will: o Continue medication as directed  Medication management . Pharmacist Clinical Goal(s): o Over the next 30 days, patient will work with PharmD and providers to achieve optimal medication adherence . Current pharmacy: [facility] . Interventions o Comprehensive medication review performed. o Utilize [facility] pharmacy for medication synchronization, packaging and delivery . Patient self care activities - Over the next 30 days, patient will: o Focus on medication adherence by patient report o Take medications as prescribed o Report any questions or concerns to PharmD and/or provider(s)  Initial goal documentation      The patient verbalized understanding of instructions provided today and declined a print copy of patient instruction materials.   Telephone follow up appointment with pharmacy team member scheduled for: 1 month  Charlene Brooke, PharmD Clinical Pharmacist [facility] [phone]    How to Take Your Blood Pressure You can take your blood pressure at home with a machine. You may need to check your blood pressure at home:  To check if you have  high blood pressure (hypertension).  To check your blood pressure over time.  To make sure your blood pressure medicine is working. Supplies needed: You will need a blood pressure machine, or monitor. You can buy one at a drugstore or online. When choosing one:  Choose one with an arm cuff.  Choose one that  wraps around your upper arm. Only one finger should fit between your arm and the cuff.  Do not choose one that measures your blood pressure from your wrist or finger. Your doctor can suggest a monitor. How to prepare Avoid these things for 30 minutes before checking your blood pressure:  Drinking caffeine.  Drinking alcohol.  Eating.  Smoking.  Exercising. Five minutes before checking your blood pressure:  Pee.  Sit in a dining chair. Avoid sitting in a soft couch or armchair.  Be quiet. Do not talk. How to take your blood pressure Follow the instructions that came with your machine. If you have a digital blood pressure monitor, these may be the instructions: 1. Sit up straight. 2. Place your feet on the floor. Do not cross your ankles or legs. 3. Rest your left arm at the level of your heart. You may rest it on a table, desk, or chair. 4. Pull up your shirt sleeve. 5. Wrap the blood pressure cuff around the upper part of your left arm. The cuff should be 1 inch (2.5 cm) above your elbow. It is best to wrap the cuff around bare skin. 6. Fit the cuff snugly around your arm. You should be able to place only one finger between the cuff and your arm. 7. Put the cord inside the groove of your elbow. 8. Press the power button. 9. Sit quietly while the cuff fills with air and loses air. 10. Write down the numbers on the screen. 11. Wait 2-3 minutes and then repeat steps 1-10. What do the numbers mean? Two numbers make up your blood pressure. The first number is called systolic pressure. The second is called diastolic pressure. An example of a blood pressure reading is "120 over 80" (or 120/80). If you are an adult and do not have a medical condition, use this guide to find out if your blood pressure is normal: Normal  First number: below 120.  Second number: below 80. Elevated  First number: 120-129.  Second number: below 80. Hypertension stage 1  First number:  130-139.  Second number: 80-89. Hypertension stage 2  First number: 140 or above.  Second number: 3 or above. Your blood pressure is above normal even if only the top or bottom number is above normal. Follow these instructions at home:  Check your blood pressure as often as your doctor tells you to.  Take your monitor to your next doctor's appointment. Your doctor will: ? Make sure you are using it correctly. ? Make sure it is working right.  Make sure you understand what your blood pressure numbers should be.  Tell your doctor if your medicines are causing side effects. Contact a doctor if:  Your blood pressure keeps being high. Get help right away if:  Your first blood pressure number is higher than 180.  Your second blood pressure number is higher than 120. This information is not intended to replace advice given to you by your health care provider. Make sure you discuss any questions you have with your health care provider. Document Revised: 02/09/2017 Document Reviewed: 08/06/2015 Elsevier Patient Education  2020 [employer].

## 2019-08-08 ENCOUNTER — Other Ambulatory Visit: Payer: Self-pay | Admitting: Internal Medicine

## 2019-08-20 ENCOUNTER — Ambulatory Visit (INDEPENDENT_AMBULATORY_CARE_PROVIDER_SITE_OTHER): Payer: Medicare Other | Admitting: Internal Medicine

## 2019-08-20 ENCOUNTER — Encounter: Payer: Self-pay | Admitting: Internal Medicine

## 2019-08-20 ENCOUNTER — Ambulatory Visit (INDEPENDENT_AMBULATORY_CARE_PROVIDER_SITE_OTHER): Payer: Medicare Other

## 2019-08-20 ENCOUNTER — Other Ambulatory Visit: Payer: Self-pay

## 2019-08-20 VITALS — BP 148/70 | HR 90 | Temp 98.5°F | Ht 74.0 in | Wt 188.4 lb

## 2019-08-20 DIAGNOSIS — R0789 Other chest pain: Secondary | ICD-10-CM | POA: Insufficient documentation

## 2019-08-20 DIAGNOSIS — R0781 Pleurodynia: Secondary | ICD-10-CM | POA: Diagnosis not present

## 2019-08-20 DIAGNOSIS — S2231XA Fracture of one rib, right side, initial encounter for closed fracture: Secondary | ICD-10-CM | POA: Diagnosis not present

## 2019-08-20 MED ORDER — TRAMADOL HCL 50 MG PO TABS: 50.0000 mg | ORAL_TABLET | Freq: Three times a day (TID) | ORAL | 0 refills | Status: DC | PRN

## 2019-08-20 NOTE — Patient Instructions (Signed)
Have an xray downstairs.   Take tylenol for mild pain or tramadol for moderate-severe pain.     Rest for the next few days.

## 2019-08-20 NOTE — Assessment & Plan Note (Signed)
Acute Fell 2 weeks ago and had pain initially, but the pain improved and returned again today Pain is moderate-severe Tylenol at home has not helped the pain and he does need something stronger Rib x-ray with chest x-ray today to rule out rib fracture Tramadol 50 mg every 8 hours as needed for severe pain.  Discussed possible side effects including constipation, drowsiness and unsteadiness.  Advised him not to drive when he is taking the medication If pain is not controlled advised for him to call

## 2019-08-20 NOTE — Progress Notes (Signed)
Subjective:    Patient ID: Ronald Stafford, male    DOB: 09-15-1930, 86 y.o.   MRN: [id number]  HPI The patient is here for an acute visit.   He fell two weeks ago.  He had some pain initially but it improved.  Today he started having pain in his right ribs again and he is unsure why. He has pain with movement and breathing.  He did take Tylenol, but it did not help much.  Medications and allergies reviewed with patient and updated if appropriate.  Patient Active Problem List   Diagnosis Date Noted  . Acute pain of right shoulder 07/10/2019  . Lightheadedness 01/03/2019  . Urinary incontinence 10/31/2018  . Left-sided chest wall pain 06/04/2018  . Numbness and tingling of right hand 08/10/2017  . History of total hip arthroplasty 03/28/2017  . Symptomatic anemia 02/22/2017  . Failed total hip arthroplasty ([place]) 02/14/2017  . Osteoarthritis of left hip 12/10/2016  . Acute right ankle pain 11/07/2016  . Acute pain of left shoulder 10/04/2016  . Chest pain 07/30/2016  . History of DVT (deep vein thrombosis) 07/30/2016  . TIA (transient ischemic attack) 07/30/2016  . Thrombocytopenia ([place]) 07/30/2016  . Carotid arterial disease ([place]) 07/20/2016  . Right sided weakness 06/27/2016  . Slurred speech 06/27/2016  . Left wrist pain 08/11/2015  . Frequent headaches 06/22/2015  . Prediabetes 02/11/2015  . Peroneal tendonitis of right lower extremity 01/12/2014  . Mass of right ankle 12/31/2013  . Right ankle instability 12/31/2013  . Radial head fracture, closed 09/18/2013  . Solitary pulmonary nodule 08/08/2012  . SPINAL STENOSIS, LUMBAR 04/06/2010  . LOW BACK PAIN SYNDROME 11/02/2009  . FOOT DROP, RIGHT 05/21/2008  . Hyperlipidemia 12/25/2006  . Essential hypertension 12/25/2006  . GERD 12/25/2006  . PROSTATE CANCER, HX OF 12/25/2006  . POLYP, COLON 05/02/2006    Current Outpatient Medications on File Prior to Visit  Medication Sig Dispense Refill  . acetaminophen (TYLENOL)  325 MG tablet Take 650 mg by mouth every 6 (six) hours as needed for moderate pain or headache.     [name] atorvastatin (LIPITOR) 40 MG tablet Take 1 tablet (40 mg total) by mouth daily. 90 tablet 1  . clopidogrel (PLAVIX) 75 MG tablet Take 1 tablet by mouth once daily 90 tablet 0  . mirabegron ER (MYRBETRIQ) 50 MG TB24 tablet Take 1 tablet (50 mg total) by mouth daily. 90 tablet 1  . Omega-3 Fatty Acids (FISH OIL) 500 MG CAPS Take by mouth.    . vitamin C (ASCORBIC ACID) 250 MG tablet Take 250 mg by mouth daily.    . pantoprazole (PROTONIX) 40 MG tablet TAKE 1 TABLET BY MOUTH TWICE DAILY (Patient not taking: Reported on 08/06/2019) 60 tablet 0   No current facility-administered medications on file prior to visit.    Past Medical History:  Diagnosis Date  . Cancer [facility]) 2004   prostate cancer, Dr. Risa Grill  . Colon polyps   . Deep venous thrombosis ([place]) 1997   Postop  . Depression   . GERD (gastroesophageal reflux disease)   . Hyperlipidemia   . Hypertension   . Shoulder fracture, left 02/23/2011   immobilized in sling , Dr Norris(no surgery)    Past Surgical History:  Procedure Laterality Date  . COLONOSCOPY  2006   Negative,Dr.Edwards  . colonoscopy with polypectomy      PMH of  . ESOPHAGOGASTRODUODENOSCOPY N/A 02/23/2017   Procedure: ESOPHAGOGASTRODUODENOSCOPY (EGD);  Surgeon: Otis Brace, MD;  Location:  [facility] ENDOSCOPY;  Service: Gastroenterology;  Laterality: N/A;  . JOINT REPLACEMENT  1997, 2001   Total Hip replacement X 2  . LUMBAR SPINE SURGERY  09/28/2010   Dr Rolena Infante; for spinal stenosis  . MECKEL DIVERTICULUM EXCISION     Age 60  . PROSTATECTOMY  2004   w/ radiation, Dr Risa Grill  . TOTAL HIP REVISION Left 02/14/2017   Procedure: Left hip polyethylene EXCHANGE;  Surgeon: Gaynelle Arabian, MD;  Location: [facility] ORS;  Service: Orthopedics;  Laterality: Left;  [name] VASECTOMY      Social History   Socioeconomic History  . Marital status: Widowed    Spouse name: Not on file  .  Number of children: 2  . Years of education: Not on file  . Highest education level: Not on file  Occupational History  . Occupation: retired  Tobacco Use  . Smoking status: Former Smoker    Packs/day: 0.50    Years: 5.00    Pack years: 2.50    Types: Cigarettes    Quit date: 03/13/1988    Years since quitting: 31.4  . Smokeless tobacco: Former Systems developer  . Tobacco comment: Quit at about age 68 / chewed tobacca x 1 year  Substance and Sexual Activity  . Alcohol use: No    Alcohol/week: 0.0 standard drinks  . Drug use: No  . Sexual activity: Never  Other Topics Concern  . Not on file  Social History Narrative   Lives alone; Wife passed 12-25-2012;   Died of ovarian cancer; breast cancer; other cancer;      Social Determinants of Health   Financial Resource Strain:   . Difficulty of Paying Living Expenses:   Food Insecurity:   . Worried About Charity fundraiser in the Last Year:   . Arboriculturist in the Last Year:   Transportation Needs: No Transportation Needs  . Lack of Transportation (Medical): No  . Lack of Transportation (Non-Medical): No  Physical Activity:   . Days of Exercise per Week:   . Minutes of Exercise per Session:   Stress:   . Feeling of Stress :   Social Connections: Somewhat Isolated  . Frequency of Communication with Friends and Family: More than three times a week  . Frequency of Social Gatherings with Friends and Family: More than three times a week  . Attends Religious Services: More than 4 times per year  . Active Member of Clubs or Organizations: No  . Attends Archivist Meetings: Never  . Marital Status: Widowed    Family History  Problem Relation Age of Onset  . Prostate cancer Father   . Testicular cancer Son     Review of Systems  Constitutional: Negative for chills and fever.  Respiratory: Negative for cough, shortness of breath and wheezing.   Cardiovascular: Positive for chest pain (Ribs).       Objective:    Vitals:   08/20/19 1408  BP: (!) 148/70  Pulse: 90  Temp: 98.5 F (36.9 C)  SpO2: 95%   BP Readings from Last 3 Encounters:  08/20/19 (!) 148/70  07/10/19 (!) 152/62  06/18/19 122/80   Wt Readings from Last 3 Encounters:  08/20/19 188 lb 6.4 oz (85.5 kg)  07/10/19 193 lb 6.4 oz (87.7 kg)  06/18/19 192 lb 12.8 oz (87.5 kg)   Body mass index is 24.19 kg/m.   Physical Exam Constitutional:      General: He is not in acute distress.  Appearance: Normal appearance. He is not ill-appearing.  HENT:     Head: Normocephalic and atraumatic.  Pulmonary:     Effort: Pulmonary effort is normal. No respiratory distress.     Breath sounds: Normal breath sounds. No wheezing or rales.  Chest:     Chest wall: Tenderness (Right lateral-slightly posterior ribs.  No obvious deformity noted) present.  Skin:    General: Skin is warm and dry.     Findings: No bruising, erythema or rash.  Neurological:     Mental Status: He is alert.            Assessment & Plan:    See Problem List for Assessment and Plan of chronic medical problems.    This visit occurred during the SARS-CoV-2 public health emergency.  Safety protocols were in place, including screening questions prior to the visit, additional usage of staff PPE, and extensive cleaning of exam room while observing appropriate contact time as indicated for disinfecting solutions.

## 2019-09-11 ENCOUNTER — Ambulatory Visit: Payer: Medicare Other | Admitting: Pharmacist

## 2019-09-11 ENCOUNTER — Other Ambulatory Visit: Payer: Self-pay

## 2019-09-11 DIAGNOSIS — I10 Essential (primary) hypertension: Secondary | ICD-10-CM

## 2019-09-11 DIAGNOSIS — E7849 Other hyperlipidemia: Secondary | ICD-10-CM

## 2019-09-11 DIAGNOSIS — G459 Transient cerebral ischemic attack, unspecified: Secondary | ICD-10-CM

## 2019-09-11 DIAGNOSIS — R32 Unspecified urinary incontinence: Secondary | ICD-10-CM

## 2019-09-11 DIAGNOSIS — K219 Gastro-esophageal reflux disease without esophagitis: Secondary | ICD-10-CM

## 2019-09-11 NOTE — Patient Instructions (Addendum)
Visit Information  Phone number for Pharmacist: [phone]  Goals Addressed            This Visit's Progress    Pharmacy Care Plan       CARE PLAN ENTRY  Current Barriers:   Chronic Disease Management support, education, and care coordination needs related to Hypertension, Hyperlipidemia, History of TIA, GERD and Urinary incontinence   Hypertension BP Readings from Last 3 Encounters:  07/10/19 (!) 152/62  06/18/19 122/80  03/25/19 129/64   Pharmacist Clinical Goal(s): o Over the next 120 days, patient will work with PharmD and providers to maintain BP goal <140/90  Current regimen:  o No medications  Interventions: o Discussed BP goal and importance of monitoring at home o Discussed importance of hydration to maintain BP and prevent dizziness  Patient self care activities - Over the next 120 days, patient will: o Check BP 3-5 times weekly document, and provide at future appointments o Ensure daily salt intake < 2300 mg/day  Hyperlipidemia/History of TIA and carotid artery disease Lab Results  Component Value Date/Time   LDLCALC 59 10/31/2018 09:11 AM   Pharmacist Clinical Goal(s): o Over the next 120 days, patient will work with PharmD and providers to maintain LDL goal < 70  Current regimen:  o atorvastatin 40 mg daily,  o clopidogrel 75 mg daily,  o omega 3 fish oil OTC  Interventions: o Discussed cholesterol goal and benefit of medications for prevention of heart attack and stroke  Patient self care activities - Over the next 120 days, patient will: o Continue medication as prescribed o Continue low cholesterol diet and exercise routine  GERD  Pharmacist Clinical Goal(s) o Over the next 120 days, patient will work with PharmD and providers to optimize antiacid regimen  Current regimen:  o No medications  Interventions: o No longer taking pantoprazole and doing well  Patient self care activities - Over the next 120 days, patient  will: o Monitor for symptoms of heartburn/reflux o May take OTC Tums or Pepcid for heartburn as needed  Urinary Incontinence  Pharmacist Clinical Goal(s) o Over the next 120 days, patient will work with PharmD and providers to optimize incontinence regimen  Current regimen:  o Mirabegron (Myrbetriq) 50 mg daily  Interventions: o Increased Myrbetriq from 25 to 50 mg  Patient self care activities - Over the next 120 days, patient will: o Continue medication as directed  Medication management  Pharmacist Clinical Goal(s): o Over the next 120 days, patient will work with PharmD and providers to achieve optimal medication adherence  Current pharmacy: [facility]  Interventions o Comprehensive medication review performed. o Utilize [facility] for medication synchronization, packaging and delivery  Patient self care activities - Over the next 120 days, patient will: o Focus on medication adherence by patient report o Take medications as prescribed o Report any questions or concerns to PharmD and/or provider(s)  Please see past updates related to this goal by clicking on the "Past Updates" button in the selected goal       The patient verbalized understanding of instructions provided today and agreed to receive a mailed copy of patient instruction and/or educational materials.  The pharmacy team will reach out to the patient again over the next 30 days.     Charlene Brooke, PharmD Clinical Pharmacist [facility] [phone]  Leg Cramps Leg cramps occur when one or more muscles tighten and you have no control over this tightening (involuntary muscle contraction). Muscle cramps can  develop in any muscle, but the most common place is in the calf muscles of the leg. Those cramps can occur during exercise or when you are at rest. Leg cramps are painful, and they may last for a few seconds to a few minutes. Cramps may return several times before they  finally stop. Usually, leg cramps are not caused by a serious medical problem. In many cases, the cause is not known. Some common causes include:  Excessive physical effort (overexertion), such as during intense exercise.  Overuse from repetitive motions, or doing the same thing over and over.  Staying in a certain position for a long period of time.  Improper preparation, form, or technique while performing a sport or an activity.  Dehydration.  Injury.  Side effects of certain medicines.  Abnormally low levels of minerals in your blood (electrolytes), especially potassium and calcium. This could result from: ? Pregnancy. ? Taking diuretic medicines. Follow these instructions at home: Eating and drinking  Drink enough fluid to keep your urine pale yellow. Staying hydrated may help prevent cramps.  Eat a healthy diet that includes plenty of nutrients to help your muscles function. A healthy diet includes fruits and vegetables, lean protein, whole grains, and low-fat or nonfat dairy products. Managing pain, stiffness, and swelling      Try massaging, stretching, and relaxing the affected muscle. Do this for several minutes at a time.  If directed, put ice on areas that are sore or painful after a cramp: ? Put ice in a plastic bag. ? Place a towel between your skin and the bag. ? Leave the ice on for 20 minutes, 2-3 times a day.  If directed, apply heat to muscles that are tense or tight. Do this before you exercise, or as often as told by your health care provider. Use the heat source that your health care provider recommends, such as a moist heat pack or a heating pad. ? Place a towel between your skin and the heat source. ? Leave the heat on for 20-30 minutes. ? Remove the heat if your skin turns bright red. This is especially important if you are unable to feel pain, heat, or cold. You may have a greater risk of getting burned.  Try taking hot showers or baths to help  relax tight muscles. General instructions  If you are having frequent leg cramps, avoid intense exercise for several days.  Take over-the-counter and prescription medicines only as told by your health care provider.  Keep all follow-up visits as told by your health care provider. This is important. Contact a health care provider if:  Your leg cramps get more severe or more frequent, or they do not improve over time.  Your foot becomes cold, numb, or blue. Summary  Muscle cramps can develop in any muscle, but the most common place is in the calf muscles of the leg.  Leg cramps are painful, and they may last for a few seconds to a few minutes.  Usually, leg cramps are not caused by a serious medical problem. Often, the cause is not known.  Stay hydrated and take over-the-counter and prescription medicines only as told by your health care provider. This information is not intended to replace advice given to you by your health care provider. Make sure you discuss any questions you have with your health care provider. Document Revised: 02/09/2017 Document Reviewed: 12/07/2016 Elsevier Patient Education  2020 [employer].

## 2019-09-11 NOTE — Chronic Care Management (AMB) (Signed)
Chronic Care Management Pharmacy  Name: [name]  MRN: [id number] DOB: 1930/08/01  Chief Complaint/ HPI  Ronald Stafford,  86 y.o. , male presents for their Follow-Up CCM visit with the clinical pharmacist via telephone due to COVID-19 Pandemic.  PCP : Binnie Rail, MD  Their chronic conditions include: HTN, Hx TIA, carotid disease, HLD, GERD, osteoarthritis, urinary incontinence  Patient lives alone, wife died 5 years ago. 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 Currently spends most of his time working in his yard and eats with his girlfriend, his friends, and family several nights per week.  Office Visits: 08/20/19 Dr Quay Burow OV: rib pain s/p fall 2 weeks prior. Rx tramadol. Xray showed rib fracture, tx is pain mgmt.  07/10/19 Dr Quay Burow OV: stopped amlodipine (BP controlled), increased Myrbetriq to 50 mg.  01/03/19 Dr Quay Burow OV: lightheadendess upon standing. Decrease amlodipine to 5 mg daily, increase fluids. Shoulder pain - Xray shows moderate arthritis. Rec'd topical arthritis cream.   11/19/18 NP Jodi Mourning OV: mechanical fall, twisted ankle  10/31/18 Dr Quay Burow OV: urinary incontinence, previous prostatectomy, wears, rx Myrbetriq.   Consult Visit: 03/25/19 Dr Donnetta Hutching (vasc surg): very active, pt has normal arterial flow, had home health screen that was false positive.   09/25/18 urgent care visit: avulsion of skin over elbow, wound care only.   Medications: Outpatient Encounter Medications as of 09/11/2019  Medication Sig  . acetaminophen (TYLENOL) 325 MG tablet Take 650 mg by mouth every 6 (six) hours as needed for moderate pain or headache.   [name] atorvastatin (LIPITOR) 40 MG tablet Take 1 tablet (40 mg total) by mouth daily.  . clopidogrel (PLAVIX) 75 MG tablet Take 1 tablet by mouth once daily  . mirabegron ER (MYRBETRIQ) 50 MG TB24 tablet Take 1 tablet (50 mg total) by mouth daily.  . Omega-3 Fatty Acids (FISH OIL) 500 MG  CAPS Take by mouth.  . vitamin C (ASCORBIC ACID) 250 MG tablet Take 250 mg by mouth daily.  . pantoprazole (PROTONIX) 40 MG tablet TAKE 1 TABLET BY MOUTH TWICE DAILY (Patient not taking: Reported on 08/06/2019)   No facility-administered encounter medications on file as of 09/11/2019.     Current Diagnosis/Assessment:  SDOH Interventions     Most Recent Value  SDOH Interventions  Financial Strain Interventions Intervention Not Indicated     Goals Addressed            This Visit's Progress   . Pharmacy Care Plan       CARE PLAN ENTRY  Current Barriers:  . Chronic Disease Management support, education, and care coordination needs related to Hypertension, Hyperlipidemia, History of TIA, GERD and Urinary incontinence   Hypertension BP Readings from Last 3 Encounters:  07/10/19 (!) 152/62  06/18/19 122/80  03/25/19 129/64 .  Pharmacist Clinical Goal(s): o Over the next 120 days, patient will work with PharmD and providers to maintain BP goal <140/90 . Current regimen:  o No medications . Interventions: o Discussed BP goal and importance of monitoring at home o Discussed importance of hydration to maintain BP and prevent dizziness . Patient self care activities - Over the next 120 days, patient will: o Check BP 3-5 times weekly document, and provide at future appointments o Ensure daily salt intake < 2300 mg/day  Hyperlipidemia/History of TIA and carotid artery disease Lab Results  Component Value Date/Time   LDLCALC 59 10/31/2018 09:11 AM .  Pharmacist Clinical Goal(s):  o Over the next 120 days, patient will work with PharmD and providers to maintain LDL goal < 70 . Current regimen:  o atorvastatin 40 mg daily,  o clopidogrel 75 mg daily,  o omega 3 fish oil OTC . Interventions: o Discussed cholesterol goal and benefit of medications for prevention of heart attack and stroke . Patient self care activities - Over the next 120 days, patient will: o Continue medication  as prescribed o Continue low cholesterol diet and exercise routine  GERD . Pharmacist Clinical Goal(s) o Over the next 120 days, patient will work with PharmD and providers to optimize antiacid regimen . Current regimen:  o No medications . Interventions: o No longer taking pantoprazole and doing well . Patient self care activities - Over the next 120 days, patient will: o Monitor for symptoms of heartburn/reflux o May take OTC Tums or Pepcid for heartburn as needed  Urinary Incontinence . Pharmacist Clinical Goal(s) o Over the next 120 days, patient will work with PharmD and providers to optimize incontinence regimen . Current regimen:  o Mirabegron (Myrbetriq) 50 mg daily . Interventions: o Increased Myrbetriq from 25 to 50 mg . Patient self care activities - Over the next 120 days, patient will: o Continue medication as directed  Medication management . Pharmacist Clinical Goal(s): o Over the next 120 days, patient will work with PharmD and providers to achieve optimal medication adherence . Current pharmacy: [facility] . Interventions o Comprehensive medication review performed. o Utilize [facility] pharmacy for medication synchronization, packaging and delivery . Patient self care activities - Over the next 120 days, patient will: o Focus on medication adherence by patient report o Take medications as prescribed o Report any questions or concerns to PharmD and/or provider(s)  Please see past updates related to this goal by clicking on the "Past Updates" button in the selected goal        Hypertension   Office blood pressures are  BP Readings from Last 3 Encounters:  08/20/19 (!) 148/70  07/10/19 (!) 152/62  06/18/19 122/80   Kidney Function Lab Results  Component Value Date/Time   CREATININE 0.96 10/31/2018 09:11 AM   CREATININE 0.92 01/21/2018 11:06 AM   CREATININE 1.88 (H) 03/05/2012 02:13 PM   GFR 74.28 10/31/2018 09:11 AM   GFRNONAA >60 02/23/2017 04:30  AM   GFRAA >60 02/23/2017 04:30 AM   Patient has failed these meds in the past: benazepril, metoprolol Patient is currently controlled on the following medications:  no medications  Patient checks BP at home infrequently  Patient home BP readings are ranging: n/a  We discussed: pt is no longer taking amlodipine, not checking BP at home. Discussed importance of monitoring BP from home.  Plan  Continue control with diet and exercise Check BP at home 3-5x per week    Hyperlipidemia   Lipid Panel     Component Value Date/Time   CHOL 118 10/31/2018 0911   TRIG 59.0 10/31/2018 0911   HDL 46.50 10/31/2018 0911   CHOLHDL 3 10/31/2018 0911   VLDL 11.8 10/31/2018 0911   LDLCALC 59 10/31/2018 0911   Hx of TIA and carotid artery disease.  Patient has failed these meds in past: simvastatin Patient is currently controlled on the following medications:   atorvastatin 40 mg daily,   clopidogrel 75 mg daily,   omega 3 fish oil OTC  We discussed:  Indications for atorvastatin and clopidogrel (hx TIA, carotid artery disease), benefits. Encouraged continued active lifestyle.  Plan  Continue current  medications and control with diet and exercise   GERD   Patient has failed these meds in past: pantoprazole Patient is currently controlled on the following medications:   No medications  We discussed:  Pt has stopped taking PPI and denies issues with heartburn.  Plan  Recommend discontinue pantoprazole from med list  Incontinence   Patient has failed these meds in past: n/a Patient is currently controlled on the following medications:   Mirabegron ER 50 mg daily  We discussed:  Pt is doing well on higher dose  Plan  Continue current medications  Arthritis/Pain   Patient has failed these meds in past: gabapentin Patient is currently controlled on the following medications:   Acetaminophen 650 mg q6h PRN  Arnica cream  We discussed:  Pt broke his rib last  month and has finished tramadol, he reports pain is much better now. He also c/o new left leg pain, he has been using Arnica cream which has helped significantly. Counseled to see PCP if pain worsens.  Plan  Continue current medications   Medication Management   Pt uses [facility] for all medications Uses pill box? Yes Pt endorses 100% compliance  We discussed: Reviewed patient's [facility] medication and Epic medication profile assuring there are no discrepancies or gaps in therapy. Confirmed all fill dates appropriate and verified with patient that there is a sufficient quantity of all prescribed medications at home. Informed patient to call me any time if needing medications before scheduled deliveries.   Plan  Utilize [facility] pharmacy for medication synchronization, packaging and delivery    Follow up: 4 month phone visit  Charlene Brooke, PharmD Clinical Pharmacist [facility] [phone]

## 2019-09-26 DIAGNOSIS — T63441A Toxic effect of venom of bees, accidental (unintentional), initial encounter: Secondary | ICD-10-CM | POA: Diagnosis not present

## 2019-09-26 DIAGNOSIS — M25472 Effusion, left ankle: Secondary | ICD-10-CM | POA: Diagnosis not present

## 2019-10-06 ENCOUNTER — Other Ambulatory Visit: Payer: Self-pay | Admitting: Internal Medicine

## 2019-10-06 ENCOUNTER — Ambulatory Visit: Payer: Self-pay | Admitting: Pharmacist

## 2019-10-06 DIAGNOSIS — E7849 Other hyperlipidemia: Secondary | ICD-10-CM

## 2019-10-06 DIAGNOSIS — I10 Essential (primary) hypertension: Secondary | ICD-10-CM

## 2019-10-06 NOTE — Chronic Care Management (AMB) (Signed)
  Chronic Care Management   Outreach Note  10/06/2019 Name: SOU NOHR MRN: [id number] DOB: [birth year]  Referred by: Binnie Rail, MD Reason for referral : No chief complaint on file.   Reviewed chart for medication changes ahead of medication coordination call. . 09/26/19 urgent care visit for bee stings: given steroid injection . No medication changes indicated   BP Readings from Last 3 Encounters:  08/20/19 (!) 148/70  07/10/19 (!) 152/62  06/18/19 122/80    Lab Results  Component Value Date   HGBA1C 6.0 10/31/2018     Medication management via [facility] pharmacy: Patient obtains medications through Adherence Packaging  90 Days   Patient is due for next adherence delivery on: 10/08/19. Called patient and reviewed medications and coordinated delivery.  This delivery to include: Tramadol Hcl 50mg  as needed every 8 hours (vial) Clopidogrel 75mg  daily-Breakfast  Vitamin C 500mg  daily- Breakfast  Fish Oil 500mg  Softgel daily- Breakfast  Myrbetriq 50mg  daily- Breakfast  Atorvastatin 40mg  daily- Breakfast  Patient needs refills for the following: -tramadol 50 mg q8h prn -clopidogrel 75 mg daily  Confirmed delivery date of 10/08/19, advised patient that pharmacy will contact them the morning of delivery.   Charlene Brooke, PharmD

## 2019-10-30 NOTE — Progress Notes (Signed)
Subjective:    Patient ID: Ronald Stafford, male    DOB: Sep 07, 1930, 86 y.o.   MRN: [id number]  HPI The patient is here for an acute visit.   Headache - he has a headache in the left upper head now.  He got hit in the left frontal region about one month ago.  He was working on his deck and hit his head on the corner of the deck.  He had no LOC.  He had headaches for a couple of weeks. They stopped but have come back.  He uses ice, heat and take tylenol and that helps.  He has headaches about once a week.  He never really had headaches in the past.    The pain is a tingling pain.  It hurts all over - burning pain.  taking a nap helps.  Certain movements of his neck/head may cause it.  He get dizzy at times.    He was concerned because he hit his head.  Medications and allergies reviewed with patient and updated if appropriate.  Patient Active Problem List   Diagnosis Date Noted  . Rib pain on right side 08/20/2019  . Acute pain of right shoulder 07/10/2019  . Lightheadedness 01/03/2019  . Urinary incontinence 10/31/2018  . Left-sided chest wall pain 06/04/2018  . Numbness and tingling of right hand 08/10/2017  . History of total hip arthroplasty 03/28/2017  . Symptomatic anemia 02/22/2017  . Failed total hip arthroplasty ([place]) 02/14/2017  . Osteoarthritis of left hip 12/10/2016  . Acute right ankle pain 11/07/2016  . Acute pain of left shoulder 10/04/2016  . Chest pain 07/30/2016  . History of DVT (deep vein thrombosis) 07/30/2016  . TIA (transient ischemic attack) 07/30/2016  . Thrombocytopenia ([place]) 07/30/2016  . Carotid arterial disease ([place]) 07/20/2016  . Right sided weakness 06/27/2016  . Slurred speech 06/27/2016  . Left wrist pain 08/11/2015  . Frequent headaches 06/22/2015  . Prediabetes 02/11/2015  . Peroneal tendonitis of right lower extremity 01/12/2014  . Mass of right ankle 12/31/2013  . Right ankle instability 12/31/2013  . Radial head fracture, closed  09/18/2013  . Solitary pulmonary nodule 08/08/2012  . SPINAL STENOSIS, LUMBAR 04/06/2010  . LOW BACK PAIN SYNDROME 11/02/2009  . FOOT DROP, RIGHT 05/21/2008  . Hyperlipidemia 12/25/2006  . Essential hypertension 12/25/2006  . GERD 12/25/2006  . PROSTATE CANCER, HX OF 12/25/2006  . POLYP, COLON 05/02/2006    Current Outpatient Medications on File Prior to Visit  Medication Sig Dispense Refill  . acetaminophen (TYLENOL) 325 MG tablet Take 650 mg by mouth every 6 (six) hours as needed for moderate pain or headache.     [name] atorvastatin (LIPITOR) 40 MG tablet Take 1 tablet (40 mg total) by mouth daily. 90 tablet 1  . clopidogrel (PLAVIX) 75 MG tablet TAKE ONE TABLET BY MOUTH ONCE DAILY 90 tablet 0  . mirabegron ER (MYRBETRIQ) 50 MG TB24 tablet Take 1 tablet (50 mg total) by mouth daily. 90 tablet 1  . Omega-3 Fatty Acids (FISH OIL) 500 MG CAPS Take by mouth.    . traMADol (ULTRAM) 50 MG tablet Take 1 tablet (50 mg total) by mouth every 8 (eight) hours as needed for severe pain. 30 tablet 0  . vitamin C (ASCORBIC ACID) 250 MG tablet Take 250 mg by mouth daily.    . pantoprazole (PROTONIX) 40 MG tablet TAKE 1 TABLET BY MOUTH TWICE DAILY (Patient not taking: Reported on 08/06/2019) 60 tablet 0  No current facility-administered medications on file prior to visit.    Past Medical History:  Diagnosis Date  . Cancer [facility]) 2004   prostate cancer, Dr. Risa Grill  . Colon polyps   . Deep venous thrombosis ([place]) 1997   Postop  . Depression   . GERD (gastroesophageal reflux disease)   . Hyperlipidemia   . Hypertension   . Shoulder fracture, left 02/23/2011   immobilized in sling , Dr Norris(no surgery)    Past Surgical History:  Procedure Laterality Date  . COLONOSCOPY  2006   Negative,Dr.Edwards  . colonoscopy with polypectomy      PMH of  . ESOPHAGOGASTRODUODENOSCOPY N/A 02/23/2017   Procedure: ESOPHAGOGASTRODUODENOSCOPY (EGD);  Surgeon: Otis Brace, MD;  Location: [facility];   Service: Gastroenterology;  Laterality: N/A;  . JOINT REPLACEMENT  1997, 2001   Total Hip replacement X 2  . LUMBAR SPINE SURGERY  09/28/2010   Dr Rolena Infante; for spinal stenosis  . MECKEL DIVERTICULUM EXCISION     Age 10  . PROSTATECTOMY  2004   w/ radiation, Dr Risa Grill  . TOTAL HIP REVISION Left 02/14/2017   Procedure: Left hip polyethylene EXCHANGE;  Surgeon: Gaynelle Arabian, MD;  Location: [facility];  Service: Orthopedics;  Laterality: Left;  [name] VASECTOMY      Social History   Socioeconomic History  . Marital status: Widowed    Spouse name: Not on file  . Number of children: 2  . Years of education: Not on file  . Highest education level: Not on file  Occupational History  . Occupation: retired  Tobacco Use  . Smoking status: Former Smoker    Packs/day: 0.50    Years: 5.00    Pack years: 2.50    Types: Cigarettes    Quit date: 03/13/1988    Years since quitting: 31.6  . Smokeless tobacco: Former Systems developer  . Tobacco comment: Quit at about age 55 / chewed tobacca x 1 year  Vaping Use  . Vaping Use: Never used  Substance and Sexual Activity  . Alcohol use: No    Alcohol/week: 0.0 standard drinks  . Drug use: No  . Sexual activity: Never  Other Topics Concern  . Not on file  Social History Narrative   Lives alone; Wife passed 12-22-2012;   Died of ovarian cancer; breast cancer; other cancer;      Social Determinants of Health   Financial Resource Strain: Low Risk   . Difficulty of Paying Living Expenses: Not very hard  Food Insecurity:   . Worried About Charity fundraiser in the Last Year: Not on file  . Ran Out of Food in the Last Year: Not on file  Transportation Needs: No Transportation Needs  . Lack of Transportation (Medical): No  . Lack of Transportation (Non-Medical): No  Physical Activity:   . Days of Exercise per Week: Not on file  . Minutes of Exercise per Session: Not on file  Stress:   . Feeling of Stress : Not on file  Social Connections: Moderately  Isolated  . Frequency of Communication with Friends and Family: More than three times a week  . Frequency of Social Gatherings with Friends and Family: More than three times a week  . Attends Religious Services: More than 4 times per year  . Active Member of Clubs or Organizations: No  . Attends Archivist Meetings: Never  . Marital Status: Widowed    Family History  Problem Relation Age of Onset  . Prostate  cancer Father   . Testicular cancer Son     Review of Systems  Constitutional: Negative for fever.  Eyes: Negative for visual disturbance.  Gastrointestinal: Negative for nausea.  Musculoskeletal: Positive for neck pain (pain with twisting it sometimes).  Neurological: Positive for dizziness, weakness (chronic - left leg - no change) and headaches. Negative for numbness.  Psychiatric/Behavioral: Positive for confusion.       Objective:   Vitals:   10/31/19 1052  BP: (!) 150/82  Pulse: 95  Temp: 98.7 F (37.1 C)  SpO2: 97%   BP Readings from Last 3 Encounters:  10/31/19 (!) 150/82  08/20/19 (!) 148/70  07/10/19 (!) 152/62   Wt Readings from Last 3 Encounters:  10/31/19 189 lb (85.7 kg)  08/20/19 188 lb 6.4 oz (85.5 kg)  07/10/19 193 lb 6.4 oz (87.7 kg)   Body mass index is 24.27 kg/m.   Physical Exam Constitutional:      General: He is not in acute distress.    Appearance: He is well-developed. He is not ill-appearing.  HENT:     Head: Normocephalic and atraumatic.  Musculoskeletal:        General: Tenderness (left posterior neck near base of skull on left) present.     Cervical back: Neck supple. No rigidity.  Skin:    General: Skin is warm and dry.  Neurological:     Mental Status: He is alert and oriented to person, place, and time.     Cranial Nerves: No cranial nerve deficit.     Sensory: No sensory deficit.     Motor: No weakness.     Coordination: Coordination normal.     Gait: Gait normal.  Psychiatric:        Mood and Affect:  Mood normal.        Behavior: Behavior normal.            Assessment & Plan:    See Problem List for Assessment and Plan of chronic medical problems.    This visit occurred during the SARS-CoV-2 public health emergency.  Safety protocols were in place, including screening questions prior to the visit, additional usage of staff PPE, and extensive cleaning of exam room while observing appropriate contact time as indicated for disinfecting solutions.

## 2019-10-31 ENCOUNTER — Other Ambulatory Visit: Payer: Self-pay

## 2019-10-31 ENCOUNTER — Ambulatory Visit (INDEPENDENT_AMBULATORY_CARE_PROVIDER_SITE_OTHER): Payer: Medicare Other | Admitting: Internal Medicine

## 2019-10-31 ENCOUNTER — Encounter: Payer: Self-pay | Admitting: Internal Medicine

## 2019-10-31 ENCOUNTER — Ambulatory Visit (INDEPENDENT_AMBULATORY_CARE_PROVIDER_SITE_OTHER)
Admission: RE | Admit: 2019-10-31 | Discharge: 2019-10-31 | Disposition: A | Discharge status: Home / Self Care | Payer: Medicare Other | Source: Ambulatory Visit | Attending: Internal Medicine | Admitting: Internal Medicine

## 2019-10-31 VITALS — BP 150/82 | HR 95 | Temp 98.7°F | Ht 74.0 in | Wt 189.0 lb

## 2019-10-31 DIAGNOSIS — R519 Headache, unspecified: Secondary | ICD-10-CM

## 2019-10-31 DIAGNOSIS — R42 Dizziness and giddiness: Secondary | ICD-10-CM | POA: Diagnosis not present

## 2019-10-31 NOTE — Patient Instructions (Signed)
A ct scan of your head was ordered.    Your posterior head pain is likely coming from your neck - continue doing what you are doing for that.    Please call if there is no improvement in your symptoms.

## 2019-11-02 DIAGNOSIS — R519 Headache, unspecified: Secondary | ICD-10-CM | POA: Insufficient documentation

## 2019-11-02 NOTE — Assessment & Plan Note (Signed)
Acute Left side of head His current headache seem to be related to his neck OA more than his recently head injury, but given headaches that are new and recent head trauma will get Ct head Continue heat/ice, tylenol Can refer to neck specialist if he wants if Ct is normal

## 2019-12-01 ENCOUNTER — Telehealth: Payer: Self-pay | Admitting: Pharmacist

## 2019-12-01 NOTE — Progress Notes (Addendum)
Chronic Care Management Pharmacy Assistant   Name: Ronald Stafford  MRN: [id number] DOB: October 04, 1930  Reason for Encounter: Medication Review  Patient Questions:  1.  Have you seen any other providers since your last visit? Yes, The patient had a visit on 10/31/2019 for nonintactable headache he saw Dr. Quay Burow.   2.  Any changes in your medicines or health? No PCP : Binnie Rail, MD  Allergies:   Allergies  Allergen Reactions   Chicken Allergy Nausea And Vomiting and Other (See Comments)    Digestive problems    Medications: Outpatient Encounter Medications as of 12/01/2019  Medication Sig   acetaminophen (TYLENOL) 325 MG tablet Take 650 mg by mouth every 6 (six) hours as needed for moderate pain or headache.    atorvastatin (LIPITOR) 40 MG tablet Take 1 tablet (40 mg total) by mouth daily.   clopidogrel (PLAVIX) 75 MG tablet TAKE ONE TABLET BY MOUTH ONCE DAILY   mirabegron ER (MYRBETRIQ) 50 MG TB24 tablet Take 1 tablet (50 mg total) by mouth daily.   Omega-3 Fatty Acids (FISH OIL) 500 MG CAPS Take by mouth.   pantoprazole (PROTONIX) 40 MG tablet TAKE 1 TABLET BY MOUTH TWICE DAILY (Patient not taking: Reported on 08/06/2019)   traMADol (ULTRAM) 50 MG tablet Take 1 tablet (50 mg total) by mouth every 8 (eight) hours as needed for severe pain.   vitamin C (ASCORBIC ACID) 250 MG tablet Take 250 mg by mouth daily.   No facility-administered encounter medications on file as of 12/01/2019.    Current Diagnosis: Patient Active Problem List   Diagnosis Date Noted   Nonintractable headache 11/02/2019   Rib pain on right side 08/20/2019   Acute pain of right shoulder 07/10/2019   Lightheadedness 01/03/2019   Urinary incontinence 10/31/2018   Left-sided chest wall pain 06/04/2018   Numbness and tingling of right hand 08/10/2017   History of total hip arthroplasty 03/28/2017   Symptomatic anemia 02/22/2017   Failed total hip arthroplasty ([place]) 02/14/2017    Osteoarthritis of left hip 12/10/2016   Acute right ankle pain 11/07/2016   Acute pain of left shoulder 10/04/2016   Chest pain 07/30/2016   History of DVT (deep vein thrombosis) 07/30/2016   TIA (transient ischemic attack) 07/30/2016   Thrombocytopenia ([place]) 07/30/2016   Carotid arterial disease ([place]) 07/20/2016   Right sided weakness 06/27/2016   Slurred speech 06/27/2016   Left wrist pain 08/11/2015   Frequent headaches 06/22/2015   Prediabetes 02/11/2015   Peroneal tendonitis of right lower extremity 01/12/2014   Mass of right ankle 12/31/2013   Right ankle instability 12/31/2013   Radial head fracture, closed 09/18/2013   Solitary pulmonary nodule 08/08/2012   SPINAL STENOSIS, LUMBAR 04/06/2010   LOW BACK PAIN SYNDROME 11/02/2009   FOOT DROP, RIGHT 05/21/2008   Hyperlipidemia 12/25/2006   Essential hypertension 12/25/2006   GERD 12/25/2006   PROSTATE CANCER, HX OF 12/25/2006   POLYP, COLON 05/02/2006    Goals Addressed   None     Follow-Up:  Pharmacist Review   Reviewed chart for medication changes ahead of medication coordination call.  No OVs, Consults, or hospital visits since last care coordination call/Pharmacist visit. (If appropriate, list visit date, provider name)  No medication changes indicated OR if recent visit, treatment plan here.  BP Readings from Last 3 Encounters:  10/31/19 (!) 150/82  08/20/19 (!) 148/70  07/10/19 (!) 152/62    Lab Results  Component Value Date   HGBA1C 6.0 10/31/2018  Patient obtains medications through Adherence Packaging  90 Days   Last adherence delivery included:   Clopidogrel 75mg  daily Vitamin C 500mg  daily Fish Oil 500mg  daily   Myrbetriq 50mg  daily  Atorvastatin 40 daily   Patient is due for next adherence delivery on: 12/31/2019. Called patient and reviewed medications and coordinated delivery.   Patient declined the following medications  Clopidogrel 75mg  daily Vitamin C  500mg  daily Fish Oil 500mg  daily   Myrbetriq 50mg  daily  Atorvastatin 40 daily   The patient received a 90-day delivery on 10/06/2019 supply ends on 01/06/2020  Patient needs refills for Clopidogrel 75mg .    Rosendo Gros, [place] Pharmacist Assistant  [phone]

## 2019-12-23 NOTE — Progress Notes (Signed)
Subjective:    Patient ID: Ronald Stafford, male    DOB: 1930/03/25, 86 y.o.   MRN: [id number]  HPI The patient is here for an acute visit.  We will also follow-up on his chronic medical problems and cancel his appointment later this month.  He fell going up stairs on his shop he hit his right abdomen with a chair Monday, 2 days ago.  It is sore. It has improved and today he states it does not even hurt.  He must cancel the appointment.  No bruising more swelling in his abdomen.   He falls because his right foot twists.  He had a brace on it for awhile and it worked, but it was uncomfortable so he stopped using it.  He uses a cane.  He typically does not better inside his house, but has more difficulty when he is out in the yard.  He has a couple of areas on his face that he scractched off but they keep coming back-he wondered if this was something concerning.     Medications and allergies reviewed with patient and updated if appropriate.  Patient Active Problem List   Diagnosis Date Noted  . Nonintractable headache 11/02/2019  . Rib pain on right side 08/20/2019  . Acute pain of right shoulder 07/10/2019  . Lightheadedness 01/03/2019  . Urinary incontinence 10/31/2018  . Left-sided chest wall pain 06/04/2018  . Numbness and tingling of right hand 08/10/2017  . History of total hip arthroplasty 03/28/2017  . Symptomatic anemia 02/22/2017  . Failed total hip arthroplasty ([place]) 02/14/2017  . Osteoarthritis of left hip 12/10/2016  . Acute right ankle pain 11/07/2016  . Acute pain of left shoulder 10/04/2016  . Chest pain 07/30/2016  . History of DVT (deep vein thrombosis) 07/30/2016  . TIA (transient ischemic attack) 07/30/2016  . Thrombocytopenia ([place]) 07/30/2016  . Carotid arterial disease ([place]) 07/20/2016  . Right sided weakness 06/27/2016  . Slurred speech 06/27/2016  . Left wrist pain 08/11/2015  . Frequent headaches 06/22/2015  . Prediabetes 02/11/2015  . Peroneal  tendonitis of right lower extremity 01/12/2014  . Mass of right ankle 12/31/2013  . Right ankle instability 12/31/2013  . Radial head fracture, closed 09/18/2013  . Solitary pulmonary nodule 08/08/2012  . SPINAL STENOSIS, LUMBAR 04/06/2010  . LOW BACK PAIN SYNDROME 11/02/2009  . FOOT DROP, RIGHT 05/21/2008  . Hyperlipidemia 12/25/2006  . Essential hypertension 12/25/2006  . GERD 12/25/2006  . PROSTATE CANCER, HX OF 12/25/2006  . POLYP, COLON 05/02/2006    Current Outpatient Medications on File Prior to Visit  Medication Sig Dispense Refill  . acetaminophen (TYLENOL) 325 MG tablet Take 650 mg by mouth every 6 (six) hours as needed for moderate pain or headache.     [name] atorvastatin (LIPITOR) 40 MG tablet Take 1 tablet (40 mg total) by mouth daily. 90 tablet 1  . clopidogrel (PLAVIX) 75 MG tablet TAKE ONE TABLET BY MOUTH ONCE DAILY 90 tablet 0  . mirabegron ER (MYRBETRIQ) 50 MG TB24 tablet Take 1 tablet (50 mg total) by mouth daily. 90 tablet 1  . Omega-3 Fatty Acids (FISH OIL) 500 MG CAPS Take by mouth.    . vitamin C (ASCORBIC ACID) 250 MG tablet Take 250 mg by mouth daily.     No current facility-administered medications on file prior to visit.    Past Medical History:  Diagnosis Date  . Cancer [facility]) 2004   prostate cancer, Dr. Risa Grill  . Colon polyps   .  [place]) 1997   Postop  . Depression   . GERD (gastroesophageal reflux disease)   . Hyperlipidemia   . Hypertension   . Shoulder fracture, left 02/23/2011   immobilized in sling , Dr Norris(no surgery)    Past Surgical History:  Procedure Laterality Date  . COLONOSCOPY  2006   Negative,Dr.Edwards  . colonoscopy with polypectomy      PMH of  . ESOPHAGOGASTRODUODENOSCOPY N/A 02/23/2017   Procedure: ESOPHAGOGASTRODUODENOSCOPY (EGD);  Surgeon: Otis Brace, MD;  Location: [facility];  Service: Gastroenterology;  Laterality: N/A;  . JOINT REPLACEMENT  1997, 2001   Total Hip replacement X 2  .  LUMBAR SPINE SURGERY  09/28/2010   Dr Rolena Infante; for spinal stenosis  . MECKEL DIVERTICULUM EXCISION     Age 83  . PROSTATECTOMY  2004   w/ radiation, Dr Risa Grill  . TOTAL HIP REVISION Left 02/14/2017   Procedure: Left hip polyethylene EXCHANGE;  Surgeon: Gaynelle Arabian, MD;  Location: [facility];  Service: Orthopedics;  Laterality: Left;  [name] VASECTOMY      Social History   Socioeconomic History  . Marital status: Widowed    Spouse name: Not on file  . Number of children: 2  . Years of education: Not on file  . Highest education level: Not on file  Occupational History  . Occupation: retired  Tobacco Use  . Smoking status: Former Smoker    Packs/day: 0.50    Years: 5.00    Pack years: 2.50    Types: Cigarettes    Quit date: 03/13/1988    Years since quitting: 31.8  . Smokeless tobacco: Former Systems developer  . Tobacco comment: Quit at about age 67 / chewed tobacca x 1 year  Vaping Use  . Vaping Use: Never used  Substance and Sexual Activity  . Alcohol use: No    Alcohol/week: 0.0 standard drinks  . Drug use: No  . Sexual activity: Never  Other Topics Concern  . Not on file  Social History Narrative   Lives alone; Wife passed 15-Dec-2012;   Died of ovarian cancer; breast cancer; other cancer;      Social Determinants of Health   Financial Resource Strain: Low Risk   . Difficulty of Paying Living Expenses: Not very hard  Food Insecurity:   . Worried About Charity fundraiser in the Last Year: Not on file  . Ran Out of Food in the Last Year: Not on file  Transportation Needs: No Transportation Needs  . Lack of Transportation (Medical): No  . Lack of Transportation (Non-Medical): No  Physical Activity:   . Days of Exercise per Week: Not on file  . Minutes of Exercise per Session: Not on file  Stress:   . Feeling of Stress : Not on file  Social Connections: Moderately Isolated  . Frequency of Communication with Friends and Family: More than three times a week  . Frequency of  Social Gatherings with Friends and Family: More than three times a week  . Attends Religious Services: More than 4 times per year  . Active Member of Clubs or Organizations: No  . Attends Archivist Meetings: Never  . Marital Status: Widowed    Family History  Problem Relation Age of Onset  . Prostate cancer Father   . Testicular cancer Son     Review of Systems  Constitutional: Negative for chills and fever.  Respiratory: Negative for cough, shortness of breath and wheezing.   Cardiovascular: Negative  for chest pain, palpitations and leg swelling.  Neurological: Positive for dizziness, light-headedness (quick movements) and headaches (sometimes).       Objective:   Vitals:   12/24/19 1040  BP: 130/80  Pulse: 87  Temp: 98 F (36.7 C)  SpO2: 99%   BP Readings from Last 3 Encounters:  12/24/19 130/80  10/31/19 (!) 150/82  08/20/19 (!) 148/70   Wt Readings from Last 3 Encounters:  12/24/19 190 lb (86.2 kg)  10/31/19 189 lb (85.7 kg)  08/20/19 188 lb 6.4 oz (85.5 kg)   Body mass index is 24.39 kg/m.   Physical Exam    Constitutional: Appears well-developed and well-nourished. No distress.  Head: Normocephalic and atraumatic.  Neck: Neck supple. No tracheal deviation present. No thyromegaly present.  No cervical lymphadenopathy Cardiovascular: Normal rate, regular rhythm and normal heart sounds.  No murmur heard. No carotid bruit .  No edema Pulmonary/Chest: Effort normal and breath sounds normal. No respiratory distress. No has no wheezes. No rales. Abdomen: No tenderness on right side of abdomen where the chair hit-he states this has resolved.  He had some mild tenderness just left of his umbilicus that he was not aware was tender-he will monitor Skin: Skin is warm and dry. Not diaphoretic.  Psychiatric: Normal mood and affect. Behavior is normal.       Assessment & Plan:    See Problem List for Assessment and Plan of chronic medical problems.     This visit occurred during the SARS-CoV-2 public health emergency.  Safety protocols were in place, including screening questions prior to the visit, additional usage of staff PPE, and extensive cleaning of exam room while observing appropriate contact time as indicated for disinfecting solutions.

## 2019-12-24 ENCOUNTER — Ambulatory Visit (INDEPENDENT_AMBULATORY_CARE_PROVIDER_SITE_OTHER): Payer: Medicare Other | Admitting: Internal Medicine

## 2019-12-24 ENCOUNTER — Encounter: Payer: Self-pay | Admitting: Internal Medicine

## 2019-12-24 ENCOUNTER — Other Ambulatory Visit: Payer: Self-pay

## 2019-12-24 VITALS — BP 130/80 | HR 87 | Temp 98.0°F | Ht 74.0 in | Wt 190.0 lb

## 2019-12-24 DIAGNOSIS — E7849 Other hyperlipidemia: Secondary | ICD-10-CM | POA: Diagnosis not present

## 2019-12-24 DIAGNOSIS — M25371 Other instability, right ankle: Secondary | ICD-10-CM

## 2019-12-24 DIAGNOSIS — R32 Unspecified urinary incontinence: Secondary | ICD-10-CM

## 2019-12-24 DIAGNOSIS — Z23 Encounter for immunization: Secondary | ICD-10-CM | POA: Diagnosis not present

## 2019-12-24 DIAGNOSIS — I10 Essential (primary) hypertension: Secondary | ICD-10-CM

## 2019-12-24 DIAGNOSIS — R7303 Prediabetes: Secondary | ICD-10-CM

## 2019-12-24 DIAGNOSIS — L989 Disorder of the skin and subcutaneous tissue, unspecified: Secondary | ICD-10-CM | POA: Diagnosis not present

## 2019-12-24 DIAGNOSIS — Z8673 Personal history of transient ischemic attack (TIA), and cerebral infarction without residual deficits: Secondary | ICD-10-CM

## 2019-12-24 LAB — CBC WITH DIFFERENTIAL/PLATELET
Basophils Absolute: 0.1 10*3/uL (ref 0.0–0.1)
Basophils Relative: 2.7 % (ref 0.0–3.0)
Eosinophils Absolute: 0.1 10*3/uL (ref 0.0–0.7)
Eosinophils Relative: 1 % (ref 0.0–5.0)
HCT: 41.7 % (ref 39.0–52.0)
Hemoglobin: 13.7 g/dL (ref 13.0–17.0)
Lymphocytes Relative: 17.6 % (ref 12.0–46.0)
Lymphs Abs: 0.9 10*3/uL (ref 0.7–4.0)
MCHC: 32.9 g/dL (ref 30.0–36.0)
MCV: 87.6 fl (ref 78.0–100.0)
Monocytes Absolute: 1 10*3/uL (ref 0.1–1.0)
Monocytes Relative: 18.2 % — ABNORMAL HIGH (ref 3.0–12.0)
Neutro Abs: 3.2 10*3/uL (ref 1.4–7.7)
Neutrophils Relative %: 60.5 % (ref 43.0–77.0)
Platelets: 131 10*3/uL — ABNORMAL LOW (ref 150.0–400.0)
RBC: 4.75 Mil/uL (ref 4.22–5.81)
RDW: 16.4 % — ABNORMAL HIGH (ref 11.5–15.5)
WBC: 5.4 10*3/uL (ref 4.0–10.5)

## 2019-12-24 LAB — HEMOGLOBIN A1C: Hgb A1c MFr Bld: 6.1 % (ref 4.6–6.5)

## 2019-12-24 LAB — COMPREHENSIVE METABOLIC PANEL
ALT: 17 U/L (ref 0–53)
AST: 21 U/L (ref 0–37)
Albumin: 4.7 g/dL (ref 3.5–5.2)
Alkaline Phosphatase: 66 U/L (ref 39–117)
BUN: 13 mg/dL (ref 6–23)
CO2: 31 mEq/L (ref 19–32)
Calcium: 9.6 mg/dL (ref 8.4–10.5)
Chloride: 104 mEq/L (ref 96–112)
Creatinine, Ser: 0.88 mg/dL (ref 0.40–1.50)
GFR: 77.18 mL/min (ref >60.00)
Glucose, Bld: 63 mg/dL — ABNORMAL LOW (ref 70–99)
Potassium: 3.7 mEq/L (ref 3.5–5.1)
Sodium: 142 mEq/L (ref 135–145)
Total Bilirubin: 0.9 mg/dL (ref 0.2–1.2)
Total Protein: 7.2 g/dL (ref 6.0–8.3)

## 2019-12-24 LAB — LIPID PANEL
Cholesterol: 118 mg/dL (ref 0–200)
HDL: 46.9 mg/dL (ref >39.00)
LDL Cholesterol: 53 mg/dL (ref 0–99)
NonHDL: 70.75
Total CHOL/HDL Ratio: 3
Triglycerides: 91 mg/dL (ref 0.0–149.0)
VLDL: 18.2 mg/dL (ref 0.0–40.0)

## 2019-12-24 NOTE — Addendum Note (Signed)
Addended by: Marcina Millard on: 12/24/2019 04:02 PM   Modules accepted: Orders

## 2019-12-24 NOTE — Patient Instructions (Signed)
  Blood work was ordered.     Flu immunization administered today.    Medications reviewed and updated.  Changes include :   none   A referral was ordered for orthopedics and dermatology.       Someone from their office will call you to schedule an appointment.    Please followup in 6 months

## 2019-12-24 NOTE — Assessment & Plan Note (Signed)
Acute Unable to evert the foot and foot tends to invert when walking causing him to fall Wearing a brace in the past did help, but the brace was uncomfortable ?  Radiculopathy, nerve damage Will refer to orthopedics for possible brace replacement to help prevent falls

## 2019-12-24 NOTE — Assessment & Plan Note (Signed)
Chronic Taking Myrbetriq 50 mg daily, which does help Continue at current dose

## 2019-12-24 NOTE — Assessment & Plan Note (Signed)
Chronic Continue Plavix, atorvastatin Check CMP, lipid panel

## 2019-12-24 NOTE — Assessment & Plan Note (Signed)
Chronic Check a1c Low sugar / carb diet Stressed regular exercise  

## 2019-12-24 NOTE — Assessment & Plan Note (Signed)
Acute Has a couple places on his face that he is scratched off, but they keep returning Concern for precancerous versus cancer Will refer to dermatology

## 2019-12-24 NOTE — Assessment & Plan Note (Addendum)
Chronic Check lipid panel, CMP Continue atorvastatin 40 mg daily Regular exercise and healthy diet encouraged

## 2019-12-25 ENCOUNTER — Encounter: Payer: Self-pay | Admitting: Orthopedic Surgery

## 2019-12-25 ENCOUNTER — Ambulatory Visit: Payer: Medicare Other | Admitting: Orthopedic Surgery

## 2019-12-25 DIAGNOSIS — M21371 Foot drop, right foot: Secondary | ICD-10-CM | POA: Diagnosis not present

## 2019-12-25 NOTE — Progress Notes (Signed)
Office Visit Note   Patient: Ronald Stafford           Date of Birth: 11-23-1930           MRN: [id number] Visit Date: 12/25/2019              Requested by: Binnie Rail, MD [place],   [id number] PCP: Binnie Rail, MD  Chief Complaint  Patient presents with   Right Ankle - Pain      HPI: Patient is an 86 year old gentleman who states that he stepped hours lumbar disc surgery with Dr. Rolena Infante.  Patient states that he has had foot drop since surgery and has  episodes of falling and unable to lift his foot up.  Assessment & Plan: Visit Diagnoses:  1. Right foot drop     Plan: Patient is given a prescription for biotech for a anterior AFO or double upright brace.  He will follow-up as needed.  Follow-Up Instructions: Return if symptoms worsen or fail to improve.   Ortho Exam  Patient is alert, oriented, no adenopathy, well-dressed, normal affect, normal respiratory effort. Examination of the right foot patient does have a varus heel alignment there is lateral instability of his ankle patient states that he keeps falling.  He has good inversion strength of the posterior tibial tendon good anterior tibial tendon strength patient has weakness with his EHL and has no motor function with eversion peroneal's brevis function.  Patient is unable to stand on his toes.  Patient has gross sensation intact plantarly and dorsally.  Imaging: No results found. No images are attached to the encounter.  Labs: Lab Results  Component Value Date   HGBA1C 6.1 12/24/2019   HGBA1C 6.0 10/31/2018   HGBA1C 6.2 12/14/2017   ESRSEDRATE 8 04/14/2015   LABURIC 4.0 12/25/2006   REPTSTATUS 09/28/2014 FINAL 09/25/2014   CULT  09/25/2014    NO GROWTH 5 DAYS Performed at [facility]      Lab Results  Component Value Date   ALBUMIN 4.7 12/24/2019   ALBUMIN 4.7 10/31/2018   ALBUMIN 4.8 01/21/2018   LABURIC 4.0 12/25/2006    Lab Results  Component Value Date    MG 2.0 09/25/2014   No results found for: VD25OH  No results found for: PREALBUMIN CBC EXTENDED Latest Ref Rng & Units 12/24/2019 10/31/2018 01/21/2018  WBC 4.0 - 10.5 K/uL 5.4 4.2 4.8  RBC 4.22 - 5.81 Mil/uL 4.75 4.55 4.29  HGB 13.0 - 17.0 g/dL 13.7 12.7(L) 12.5(L)  HCT 39 - 52 % 41.7 39.2 38.1(L)  PLT 150 - 400 K/uL 131.0(L) 101.0(L) 134.0(L)  NEUTROABS 1.4 - 7.7 K/uL 3.2 2.5 3.0  LYMPHSABS 0.7 - 4.0 K/uL 0.9 0.9 1.0     There is no height or weight on file to calculate BMI.  Orders:  No orders of the defined types were placed in this encounter.  No orders of the defined types were placed in this encounter.    Procedures: No procedures performed  Clinical Data: No additional findings.  ROS:  All other systems negative, except as noted in the HPI. Review of Systems  Objective: Vital Signs: There were no vitals taken for this visit.  Specialty Comments:  No specialty comments available.  PMFS History: Patient Active Problem List   Diagnosis Date Noted   Nonintractable headache 11/02/2019   Rib pain on right side 08/20/2019   Acute pain of right shoulder 07/10/2019   Lightheadedness 01/03/2019  Urinary incontinence 10/31/2018   Left-sided chest wall pain 06/04/2018   Numbness and tingling of right hand 08/10/2017   Skin abnormalities 06/13/2017   History of total hip arthroplasty 03/28/2017   Symptomatic anemia 02/22/2017   Failed total hip arthroplasty ([place]) 02/14/2017   Osteoarthritis of left hip 12/10/2016   Acute right ankle pain 11/07/2016   Acute pain of left shoulder 10/04/2016   Chest pain 07/30/2016   History of DVT (deep vein thrombosis) 07/30/2016   History of TIA (transient ischemic attack) 07/30/2016   Thrombocytopenia ([place]) 07/30/2016   Carotid arterial disease ([place]) 07/20/2016   Right sided weakness 06/27/2016   Slurred speech 06/27/2016   Left wrist pain 08/11/2015   Frequent headaches 06/22/2015   Prediabetes  02/11/2015   Peroneal tendonitis of right lower extremity 01/12/2014   Mass of right ankle 12/31/2013   Ankle gives way, right 12/31/2013   Radial head fracture, closed 09/18/2013   Solitary pulmonary nodule 08/08/2012   SPINAL STENOSIS, LUMBAR 04/06/2010   LOW BACK PAIN SYNDROME 11/02/2009   FOOT DROP, RIGHT 05/21/2008   Hyperlipidemia 12/25/2006   GERD 12/25/2006   PROSTATE CANCER, HX OF 12/25/2006   POLYP, COLON 05/02/2006   Past Medical History:  Diagnosis Date   Cancer ([place]) 2004   prostate cancer, Dr. Risa Grill   Colon polyps    Deep venous thrombosis ([place]) 1997   Postop   Depression    GERD (gastroesophageal reflux disease)    Hyperlipidemia    Hypertension    Shoulder fracture, left 02/23/2011   immobilized in sling , Dr Norris(no surgery)    Family History  Problem Relation Age of Onset   Prostate cancer Father    Testicular cancer Son     Past Surgical History:  Procedure Laterality Date   COLONOSCOPY  2006   Negative,Dr.Edwards   colonoscopy with polypectomy      PMH of   ESOPHAGOGASTRODUODENOSCOPY N/A 02/23/2017   Procedure: ESOPHAGOGASTRODUODENOSCOPY (EGD);  Surgeon: Otis Brace, MD;  Location: [facility];  Service: Gastroenterology;  Laterality: N/A;   JOINT REPLACEMENT  1997, 2001   Total Hip replacement X 2   LUMBAR SPINE SURGERY  09/28/2010   Dr Rolena Infante; for spinal stenosis   MECKEL DIVERTICULUM EXCISION     Age 61   PROSTATECTOMY  2004   w/ radiation, Dr Risa Grill   TOTAL HIP REVISION Left 02/14/2017   Procedure: Left hip polyethylene EXCHANGE;  Surgeon: Gaynelle Arabian, MD;  Location: [facility];  Service: Orthopedics;  Laterality: Left;   VASECTOMY     Social History   Occupational History   Occupation: retired  Tobacco Use   Smoking status: Former Smoker    Packs/day: 0.50    Years: 5.00    Pack years: 2.50    Types: Cigarettes    Quit date: 03/13/1988    Years since quitting: 31.8   Smokeless tobacco:  Former Systems developer   Tobacco comment: Quit at about age 22 / chewed tobacca x 1 year  Vaping Use   Vaping Use: Never used  Substance and Sexual Activity   Alcohol use: No    Alcohol/week: 0.0 standard drinks   Drug use: No   Sexual activity: Never

## 2019-12-26 ENCOUNTER — Telehealth: Payer: Self-pay | Admitting: Pharmacist

## 2019-12-26 NOTE — Progress Notes (Addendum)
Chronic Care Management Pharmacy Assistant   Name: KARMINE KAUER  MRN: [id number] DOB: [birth year]  Reason for Encounter: Medication Review  Patient Questions  1.  Have you seen any other providers since your last visit? -12/24/19 Dr Quay Burow OV: chronic f/u and evaluation of foot drop; pantoprazole and tramadol discontinued due to patient not taking. -12/25/19 Dr Sharol Given OV: chronic f/u for R foot drop, prescribed a brace  2.  Any changes in your medicines or health? No   PCP : Binnie Rail, MD  Allergies:   Allergies  Allergen Reactions  . Chicken Allergy Nausea And Vomiting and Other (See Comments)    Digestive problems    Medications: Outpatient Encounter Medications as of 12/26/2019  Medication Sig  . acetaminophen (TYLENOL) 325 MG tablet Take 650 mg by mouth every 6 (six) hours as needed for moderate pain or headache.   [name] atorvastatin (LIPITOR) 40 MG tablet Take 1 tablet (40 mg total) by mouth daily.  . clopidogrel (PLAVIX) 75 MG tablet TAKE ONE TABLET BY MOUTH ONCE DAILY  . mirabegron ER (MYRBETRIQ) 50 MG TB24 tablet Take 1 tablet (50 mg total) by mouth daily.  . Omega-3 Fatty Acids (FISH OIL) 500 MG CAPS Take by mouth.  . vitamin C (ASCORBIC ACID) 250 MG tablet Take 250 mg by mouth daily.   No facility-administered encounter medications on file as of 12/26/2019.    Current Diagnosis: Patient Active Problem List   Diagnosis Date Noted  . Nonintractable headache 11/02/2019  . Rib pain on right side 08/20/2019  . Acute pain of right shoulder 07/10/2019  . Lightheadedness 01/03/2019  . Urinary incontinence 10/31/2018  . Left-sided chest wall pain 06/04/2018  . Numbness and tingling of right hand 08/10/2017  . Skin abnormalities 06/13/2017  . History of total hip arthroplasty 03/28/2017  . Symptomatic anemia 02/22/2017  . Failed total hip arthroplasty ([place]) 02/14/2017  . Osteoarthritis of left hip 12/10/2016  . Acute right ankle pain 11/07/2016  . Acute pain of left  shoulder 10/04/2016  . Chest pain 07/30/2016  . History of DVT (deep vein thrombosis) 07/30/2016  . History of TIA (transient ischemic attack) 07/30/2016  . Thrombocytopenia ([place]) 07/30/2016  . Carotid arterial disease ([place]) 07/20/2016  . Right sided weakness 06/27/2016  . Slurred speech 06/27/2016  . Left wrist pain 08/11/2015  . Frequent headaches 06/22/2015  . Prediabetes 02/11/2015  . Peroneal tendonitis of right lower extremity 01/12/2014  . Mass of right ankle 12/31/2013  . Ankle gives way, right 12/31/2013  . Radial head fracture, closed 09/18/2013  . Solitary pulmonary nodule 08/08/2012  . SPINAL STENOSIS, LUMBAR 04/06/2010  . LOW BACK PAIN SYNDROME 11/02/2009  . FOOT DROP, RIGHT 05/21/2008  . Hyperlipidemia 12/25/2006  . GERD 12/25/2006  . PROSTATE CANCER, HX OF 12/25/2006  . POLYP, COLON 05/02/2006    Goals Addressed   None     Follow-Up:  Coordination of Enhanced Pharmacy Services  Reviewed chart for medication changes ahead of medication coordination call.  No OVs, Consults, or hospital visits since last care coordination call/Pharmacist visit. (If appropriate, list visit date, provider name)  No medication changes indicated OR if recent visit, treatment plan here.  BP Readings from Last 3 Encounters:  12/24/19 130/80  10/31/19 (!) 150/82  08/20/19 (!) 148/70    Lab Results  Component Value Date   HGBA1C 6.1 12/24/2019     Patient obtains medications through Adherence Packaging  90 Days   Last adherence delivery included:  Clopidogrel 75 mg; 1 tab-breakfast Vitamin C 500 mg; 1 tab-breakfast Fish oil 500 mg; 1 cap-breakfast Myrbetriq 50 mg; 1 tab-breakfast Atorvastatin 40 mg; 1 tab-breakfast  Patient is due for next adherence delivery on: 01/04/20 Called patient and reviewed medications and coordinated delivery.  This delivery to include: Clopidogrel 75 mg; 1 tab-breakfast Vitamin C 500 mg; 1 tab-breakfast Fish oil 500 mg; 1  cap-breakfast Myrbetriq 50 mg; 1 tab-breakfast Atorvastatin 40 mg; 1 tab-breakfast   Patient needs refills for Clopidogrel 75 mg  Confirmed delivery date of 01/04/20, advised patient that pharmacy will contact them the morning of delivery.   Rosendo Gros, [facility]  Practice Team Manager/ CPA (Clinical Pharmacist Assistant) [phone]

## 2019-12-31 ENCOUNTER — Other Ambulatory Visit: Payer: Self-pay | Admitting: Internal Medicine

## 2020-01-06 ENCOUNTER — Telehealth: Payer: Self-pay | Admitting: Internal Medicine

## 2020-01-06 DIAGNOSIS — L821 Other seborrheic keratosis: Secondary | ICD-10-CM | POA: Diagnosis not present

## 2020-01-06 NOTE — Telephone Encounter (Signed)
Pt called me asking about Dr. Quay Burow writing a letter for him regarding his mail box.  Pt has to cross the road to get his mail and he feels very unsafe with this, so he moved his mailbox to his side of the street.  He went to talk with the post office today and they state they will need a letter from his doctor stating he is physically handicapped and he lives alone.  He has an appointment today with a dermatologist at 2:30 and was hoping to stop by after to pick it up. Please follow up with pt

## 2020-01-06 NOTE — Telephone Encounter (Signed)
Letter written

## 2020-01-06 NOTE — Telephone Encounter (Signed)
Spoke with patient today. 

## 2020-01-09 ENCOUNTER — Ambulatory Visit: Payer: Medicare Other | Admitting: Internal Medicine

## 2020-01-12 ENCOUNTER — Telehealth: Payer: Medicare Other

## 2020-01-12 NOTE — Chronic Care Management (AMB) (Deleted)
Chronic Care Management Pharmacy  Name: DAXTEN KOVALENKO  MRN: [id number] DOB: October 27, 1930  Chief Complaint/ HPI  Ronald Stafford,  86 y.o. , male presents for their Follow-Up CCM visit with the clinical pharmacist via telephone due to COVID-19 Pandemic.  PCP : Binnie Rail, MD  Their chronic conditions include: HTN, Hx TIA, carotid disease, HLD, GERD, osteoarthritis, urinary incontinence  Patient lives alone, wife died 5 years ago. 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 Currently spends most of his time working in his yard and eats with his girlfriend, his friends, and family several nights per week.  Office Visits: 12/24/19 Dr Quay Burow OV: acute visit for falling on stairs 2 days prior d/t to foot twisting. Referred to ortho for possible brace placement. Referred to dermatology for facial abnormalities  10/31/19 Dr Quay Burow OV: acute visit for headache, hit his head on deck a month prior. More likely related to neck OA, but ordered CT head to r/o bleed. CT head negative for acute changes.  08/20/19 Dr Quay Burow OV: rib pain s/p fall 2 weeks prior. Rx tramadol. Xray showed rib fracture, tx is pain mgmt.  07/10/19 Dr Quay Burow OV: stopped amlodipine (BP controlled), increased Myrbetriq to 50 mg.  Consult Visit: 12/25/19 Dr Sharol Given (ortho): odered Biotech brace for foot drop.  03/25/19 Dr Early (vasc surg): very active, pt has normal arterial flow, had home health screen that was false positive.    Allergies  Allergen Reactions  . Chicken Allergy Nausea And Vomiting and Other (See Comments)    Digestive problems   Medications: Outpatient Encounter Medications as of 01/12/2020  Medication Sig  . acetaminophen (TYLENOL) 325 MG tablet Take 650 mg by mouth every 6 (six) hours as needed for moderate pain or headache.   [name] atorvastatin (LIPITOR) 40 MG tablet Take 1 tablet (40 mg total) by mouth daily.  . clopidogrel (PLAVIX) 75 MG tablet TAKE ONE TABLET BY  MOUTH EVERY MORNING  . mirabegron ER (MYRBETRIQ) 50 MG TB24 tablet Take 1 tablet (50 mg total) by mouth daily.  . Omega-3 Fatty Acids (FISH OIL) 500 MG CAPS Take by mouth.  . vitamin C (ASCORBIC ACID) 250 MG tablet Take 250 mg by mouth daily.   No facility-administered encounter medications on file as of 01/12/2020.   Wt Readings from Last 3 Encounters:  12/24/19 190 lb (86.2 kg)  10/31/19 189 lb (85.7 kg)  08/20/19 188 lb 6.4 oz (85.5 kg)   Current Diagnosis/Assessment:   Goals Addressed   None     Hypertension   BP goal < 140/90  Office blood pressures are  BP Readings from Last 3 Encounters:  12/24/19 130/80  10/31/19 (!) 150/82  08/20/19 (!) 148/70   Lab Results  Component Value Date   CREATININE 0.88 12/24/2019   BUN 13 12/24/2019   GFR 77.18 12/24/2019   GFRNONAA >60 02/23/2017   GFRAA >60 02/23/2017   NA 142 12/24/2019   K 3.7 12/24/2019   CALCIUM 9.6 12/24/2019   CO2 31 12/24/2019   Patient has failed these meds in the past: benazepril, metoprolol Patient is currently controlled on the following medications:   no medications  Patient checks BP at home infrequently  Patient home BP readings are ranging: n/a  We discussed: pt is no longer taking amlodipine, not checking BP at home. Discussed importance of monitoring BP from home.  Plan  Continue control with diet and exercise Check BP at home 3-5x per  week    Hyperlipidemia   LDL goal < 70 Hx of TIA and carotid artery disease.  Lipid Panel     Component Value Date/Time   CHOL 118 12/24/2019 1143   TRIG 91.0 12/24/2019 1143   HDL 46.90 12/24/2019 1143   CHOLHDL 3 12/24/2019 1143   VLDL 18.2 12/24/2019 1143   LDLCALC 53 12/24/2019 1143   Hepatic Function Latest Ref Rng & Units 12/24/2019 10/31/2018 01/21/2018  Total Protein 6.0 - 8.3 g/dL 7.2 6.7 7.3  Albumin 3.5 - 5.2 g/dL 4.7 4.7 4.8  AST 0 - 37 U/L _0 ALT 0 - 53 U/L _1 Alk Phosphatase 39 - 117 U/L 66 53 56  Total  Bilirubin 0.2 - 1.2 mg/dL 0.9 0.8 0.9  Bilirubin, Direct 0.0 - 0.3 mg/dL - - -   Patient has failed these meds in past: simvastatin Patient is currently controlled on the following medications:   atorvastatin 40 mg daily,   clopidogrel 75 mg daily,   omega 3 fish oil OTC  We discussed:  Indications for atorvastatin and clopidogrel (hx TIA, carotid artery disease), benefits. Encouraged continued active lifestyle.  Plan  Continue current medications and control with diet and exercise  Incontinence   Patient has failed these meds in past: n/a Patient is currently controlled on the following medications:   Mirabegron ER 50 mg daily  We discussed:  Pt is doing well on higher dose  Plan  Continue current medications  Arthritis/Pain   Patient has failed these meds in past: gabapentin Patient is currently controlled on the following medications:   Acetaminophen 650 mg q6h PRN  Arnica cream  We discussed:  Pt broke his rib last month and has finished tramadol, he reports pain is much better now. He also c/o new left leg pain, he has been using Arnica cream which has helped significantly. Counseled to see PCP if pain worsens.  Plan  Continue current medications   Medication Management   Pt uses [facility] for all medications Uses pill box? Yes Pt endorses 100% compliance  We discussed: Reviewed patient's [facility] medication and Epic medication profile assuring there are no discrepancies or gaps in therapy. Confirmed all fill dates appropriate and verified with patient that there is a sufficient quantity of all prescribed medications at home. Informed patient to call me any time if needing medications before scheduled deliveries.   Plan  Utilize [facility] pharmacy for medication synchronization, packaging and delivery    Follow up: *** month phone visit  Charlene Brooke, PharmD, BCACP Clinical Pharmacist [facility] [phone]

## 2020-01-27 ENCOUNTER — Telehealth: Payer: Self-pay | Admitting: Pharmacist

## 2020-01-27 NOTE — Progress Notes (Addendum)
Chronic Care Management Pharmacy Assistant   Name: Ronald Stafford  MRN: [id number] DOB: 01/31/1931  Reason for Encounter: Medication Review NOFILL  PCP : Binnie Rail, MD  Allergies:   Allergies  Allergen Reactions  . Chicken Allergy Nausea And Vomiting and Other (See Comments)    Digestive problems    Medications: Outpatient Encounter Medications as of 01/27/2020  Medication Sig  . acetaminophen (TYLENOL) 325 MG tablet Take 650 mg by mouth every 6 (six) hours as needed for moderate pain or headache.   [name] atorvastatin (LIPITOR) 40 MG tablet Take 1 tablet (40 mg total) by mouth daily.  . clopidogrel (PLAVIX) 75 MG tablet TAKE ONE TABLET BY MOUTH EVERY MORNING  . mirabegron ER (MYRBETRIQ) 50 MG TB24 tablet Take 1 tablet (50 mg total) by mouth daily.  . Omega-3 Fatty Acids (FISH OIL) 500 MG CAPS Take by mouth.  . vitamin C (ASCORBIC ACID) 250 MG tablet Take 250 mg by mouth daily.   No facility-administered encounter medications on file as of 01/27/2020.    Current Diagnosis: Patient Active Problem List   Diagnosis Date Noted  . Nonintractable headache 11/02/2019  . Rib pain on right side 08/20/2019  . Acute pain of right shoulder 07/10/2019  . Lightheadedness 01/03/2019  . Urinary incontinence 10/31/2018  . Left-sided chest wall pain 06/04/2018  . Numbness and tingling of right hand 08/10/2017  . Skin abnormalities 06/13/2017  . History of total hip arthroplasty 03/28/2017  . Symptomatic anemia 02/22/2017  . Failed total hip arthroplasty ([place]) 02/14/2017  . Osteoarthritis of left hip 12/10/2016  . Acute right ankle pain 11/07/2016  . Acute pain of left shoulder 10/04/2016  . Chest pain 07/30/2016  . History of DVT (deep vein thrombosis) 07/30/2016  . History of TIA (transient ischemic attack) 07/30/2016  . Thrombocytopenia ([place]) 07/30/2016  . Carotid arterial disease ([place]) 07/20/2016  . Right sided weakness 06/27/2016  . Slurred speech 06/27/2016  . Left wrist pain  08/11/2015  . Frequent headaches 06/22/2015  . Prediabetes 02/11/2015  . Peroneal tendonitis of right lower extremity 01/12/2014  . Mass of right ankle 12/31/2013  . Ankle gives way, right 12/31/2013  . Radial head fracture, closed 09/18/2013  . Solitary pulmonary nodule 08/08/2012  . SPINAL STENOSIS, LUMBAR 04/06/2010  . LOW BACK PAIN SYNDROME 11/02/2009  . FOOT DROP, RIGHT 05/21/2008  . Hyperlipidemia 12/25/2006  . GERD 12/25/2006  . PROSTATE CANCER, HX OF 12/25/2006  . POLYP, COLON 05/02/2006    Goals Addressed   None     Follow-Up:  Pharmacist Review  Reviewed chart for medication changes ahead of medication coordination call.  No OVs, Consults, or hospital visits since last care coordination call/Pharmacist visit.  No medication changes indicated   BP Readings from Last 3 Encounters:  12/24/19 130/80  10/31/19 (!) 150/82  08/20/19 (!) 148/70    Lab Results  Component Value Date   HGBA1C 6.1 12/24/2019     Patient obtains medications through Adherence Packaging  90 Days   Last adherence delivery included: Tramadol Hcl 50 mg 1 tab every 8 hours for pain Clopidogrel 75 mg 1 tab every morning Vitamin C 500 mg tab 1 tab every morning Fish oil 500 mg 1 cap every morning myrbetriq 50 mg 1 tab every morning Atorvastatin 40 mg 1 tab every morning   Patient is due for next adherence delivery on: 04/05/2020. Called patient and reviewed medications and coordinated delivery.   Patient declined the following medications today: Tramadol Hcl  50 mg 1 tab every 8 hours for pain Clopidogrel 75 mg 1 tab every morning Vitamin C 500 mg tab 1 tab every morning Fish oil 500 mg 1 cap every morning myrbetriq 50 mg 1 tab every morning Atorvastatin 40 mg 1 tab every morning   due to the patient has enough on hand to last until next delivery date   Confirmed delivery date of 04/04/2020, advised patient that pharmacy will contact them the morning of delivery.   Wendy Poet,  Clinical Pharmacist Assistant [facility]

## 2020-01-28 ENCOUNTER — Telehealth: Payer: Medicare Other

## 2020-02-25 ENCOUNTER — Telehealth: Payer: Self-pay | Admitting: Pharmacist

## 2020-02-25 NOTE — Progress Notes (Addendum)
Chronic Care Management Pharmacy Assistant   Name: Ronald Stafford  MRN: [id number] DOB: 1930/10/08  Reason for Encounter: Medication Review   PCP : Binnie Rail, MD  Allergies:   Allergies  Allergen Reactions   Chicken Allergy Nausea And Vomiting and Other (See Comments)    Digestive problems    Medications: Outpatient Encounter Medications as of 02/25/2020  Medication Sig   acetaminophen (TYLENOL) 325 MG tablet Take 650 mg by mouth every 6 (six) hours as needed for moderate pain or headache.    atorvastatin (LIPITOR) 40 MG tablet Take 1 tablet (40 mg total) by mouth daily.   clopidogrel (PLAVIX) 75 MG tablet TAKE ONE TABLET BY MOUTH EVERY MORNING   mirabegron ER (MYRBETRIQ) 50 MG TB24 tablet Take 1 tablet (50 mg total) by mouth daily.   Omega-3 Fatty Acids (FISH OIL) 500 MG CAPS Take by mouth.   vitamin C (ASCORBIC ACID) 250 MG tablet Take 250 mg by mouth daily.   No facility-administered encounter medications on file as of 02/25/2020.    Current Diagnosis: Patient Active Problem List   Diagnosis Date Noted   Nonintractable headache 11/02/2019   Rib pain on right side 08/20/2019   Acute pain of right shoulder 07/10/2019   Lightheadedness 01/03/2019   Urinary incontinence 10/31/2018   Left-sided chest wall pain 06/04/2018   Numbness and tingling of right hand 08/10/2017   Skin abnormalities 06/13/2017   History of total hip arthroplasty 03/28/2017   Symptomatic anemia 02/22/2017   Failed total hip arthroplasty ([place]) 02/14/2017   Osteoarthritis of left hip 12/10/2016   Acute right ankle pain 11/07/2016   Acute pain of left shoulder 10/04/2016   Chest pain 07/30/2016   History of DVT (deep vein thrombosis) 07/30/2016   History of TIA (transient ischemic attack) 07/30/2016   Thrombocytopenia ([place]) 07/30/2016   Carotid arterial disease ([place]) 07/20/2016   Right sided weakness 06/27/2016   Slurred speech 06/27/2016   Left wrist pain 08/11/2015   Frequent headaches  06/22/2015   Prediabetes 02/11/2015   Peroneal tendonitis of right lower extremity 01/12/2014   Mass of right ankle 12/31/2013   Ankle gives way, right 12/31/2013   Radial head fracture, closed 09/18/2013   Solitary pulmonary nodule 08/08/2012   SPINAL STENOSIS, LUMBAR 04/06/2010   LOW BACK PAIN SYNDROME 11/02/2009   FOOT DROP, RIGHT 05/21/2008   Hyperlipidemia 12/25/2006   GERD 12/25/2006   PROSTATE CANCER, HX OF 12/25/2006   POLYP, COLON 05/02/2006    Goals Addressed   None     Follow-Up:  Coordination of Enhanced Pharmacy Services   Reviewed chart for medication changes ahead of medication coordination call.  No OVs, Consults, or hospital visits since last care coordination call/Pharmacist visit. No medication changes indicated   BP Readings from Last 3 Encounters:  12/24/19 130/80  10/31/19 (!) 150/82  08/20/19 (!) 148/70    Lab Results  Component Value Date   HGBA1C 6.1 12/24/2019     Patient obtains medications through Adherence Packaging  90 Days   Last adherence delivery included:   Clopidogrel 75 mg 1 tab every morning Vitamin C 500 mg tab 1 tab every morning Fish oil 500 mg 1 cap every morning myrbetriq 50 mg 1 tab every morning Atorvastatin 40 mg 1 tab every morning   Patient is due for next adherence delivery on: 04/05/2020. Called patient and reviewed medications and coordinated delivery.  Patient declined need for medication refills at this time.   Wendy Poet, Clinical Pharmacist  Educational psychologist

## 2020-03-19 ENCOUNTER — Telehealth: Payer: Self-pay | Admitting: Pharmacist

## 2020-03-19 NOTE — Progress Notes (Signed)
Chronic Care Management Pharmacy Assistant   Name: Ronald Stafford  MRN: [id number] DOB: 1930-06-28  Reason for Encounter: Medication Review   PCP : Binnie Rail, MD  Allergies:   Allergies  Allergen Reactions   Chicken Allergy Nausea And Vomiting and Other (See Comments)    Digestive problems    Medications: Outpatient Encounter Medications as of 03/19/2020  Medication Sig   acetaminophen (TYLENOL) 325 MG tablet Take 650 mg by mouth every 6 (six) hours as needed for moderate pain or headache.    atorvastatin (LIPITOR) 40 MG tablet Take 1 tablet (40 mg total) by mouth daily.   clopidogrel (PLAVIX) 75 MG tablet TAKE ONE TABLET BY MOUTH EVERY MORNING   mirabegron ER (MYRBETRIQ) 50 MG TB24 tablet Take 1 tablet (50 mg total) by mouth daily.   Omega-3 Fatty Acids (FISH OIL) 500 MG CAPS Take by mouth.   vitamin C (ASCORBIC ACID) 250 MG tablet Take 250 mg by mouth daily.   No facility-administered encounter medications on file as of 03/19/2020.    Current Diagnosis: Patient Active Problem List   Diagnosis Date Noted   Nonintractable headache 11/02/2019   Rib pain on right side 08/20/2019   Acute pain of right shoulder 07/10/2019   Lightheadedness 01/03/2019   Urinary incontinence 10/31/2018   Left-sided chest wall pain 06/04/2018   Numbness and tingling of right hand 08/10/2017   Skin abnormalities 06/13/2017   History of total hip arthroplasty 03/28/2017   Symptomatic anemia 02/22/2017   Failed total hip arthroplasty ([place]) 02/14/2017   Osteoarthritis of left hip 12/10/2016   Acute right ankle pain 11/07/2016   Acute pain of left shoulder 10/04/2016   Chest pain 07/30/2016   History of DVT (deep vein thrombosis) 07/30/2016   History of TIA (transient ischemic attack) 07/30/2016   Thrombocytopenia ([place]) 07/30/2016   Carotid arterial disease ([place]) 07/20/2016   Right sided weakness 06/27/2016   Slurred speech 06/27/2016   Left wrist pain  08/11/2015   Frequent headaches 06/22/2015   Prediabetes 02/11/2015   Peroneal tendonitis of right lower extremity 01/12/2014   Mass of right ankle 12/31/2013   Ankle gives way, right 12/31/2013   Radial head fracture, closed 09/18/2013   Solitary pulmonary nodule 08/08/2012   SPINAL STENOSIS, LUMBAR 04/06/2010   LOW BACK PAIN SYNDROME 11/02/2009   FOOT DROP, RIGHT 05/21/2008   Hyperlipidemia 12/25/2006   GERD 12/25/2006   PROSTATE CANCER, HX OF 12/25/2006   POLYP, COLON 05/02/2006    Goals Addressed   None       BP Readings from Last 3 Encounters:  12/24/19 130/80  10/31/19 (!) 150/82  08/20/19 (!) 148/70    Lab Results  Component Value Date   HGBA1C 6.1 12/24/2019     Patient obtains medications through Adherence Packaging  90 Days   Last adherence delivery included (date:12/31/19): (medication name and frequency) Tramadol Hcl 50 mg 1 tab every 8 hours for pain Clopidogrel 75 mg 1 tab every morning Vitamin C 500 mg tab 1 tab every morning Fish oil 500 mg 1 cap every morning myrbetriq 50 mg 1 tab every morning Atorvastatin 40 mg 1 tab every morning   Patient declined: Tramadol Hcl 50 mg 1 tab every 8 hours for pain Clopidogrel 75 mg 1 tab every morning Vitamin C 500 mg tab 1 tab every morning Fish oil 500 mg 1 cap every morning myrbetriq 50 mg 1 tab every morning Atorvastatin 40 mg 1 tab every morning  last delivery due  to he stated that he missed taking about 2wks of his medications so he has plenty until next February.   Patient is due for next adherence delivery on: 04/05/20. Called patient and reviewed medication needs.  Patient declined need for refills at this time. Patient is aware of how to contact pharmacist if refills are needed prior to next adherence delivery.   Wendy Poet, [place] [phone]

## 2020-03-20 ENCOUNTER — Other Ambulatory Visit: Payer: Self-pay | Admitting: Internal Medicine

## 2020-04-23 ENCOUNTER — Telehealth: Payer: Self-pay | Admitting: Pharmacist

## 2020-04-23 NOTE — Progress Notes (Signed)
Chronic Care Management Pharmacy Assistant   Name: DONTRELLE MAZON  MRN: [id number] DOB: 1930/05/24  Reason for Encounter: Medication Review  PCP : Binnie Rail, MD  Allergies:   Allergies  Allergen Reactions  . Chicken Allergy Nausea And Vomiting and Other (See Comments)    Digestive problems    Medications: Outpatient Encounter Medications as of 04/23/2020  Medication Sig  . acetaminophen (TYLENOL) 325 MG tablet Take 650 mg by mouth every 6 (six) hours as needed for moderate pain or headache.   [name] atorvastatin (LIPITOR) 40 MG tablet TAKE ONE TABLET BY MOUTH EVERY MORNING  . clopidogrel (PLAVIX) 75 MG tablet TAKE ONE TABLET BY MOUTH EVERY MORNING  . MYRBETRIQ 50 MG TB24 tablet TAKE ONE TABLET BY MOUTH EVERY MORNING  . Omega-3 Fatty Acids (FISH OIL) 500 MG CAPS Take by mouth.  . vitamin C (ASCORBIC ACID) 250 MG tablet Take 250 mg by mouth daily.   No facility-administered encounter medications on file as of 04/23/2020.    Current Diagnosis: Patient Active Problem List   Diagnosis Date Noted  . Nonintractable headache 11/02/2019  . Rib pain on right side 08/20/2019  . Acute pain of right shoulder 07/10/2019  . Lightheadedness 01/03/2019  . Urinary incontinence 10/31/2018  . Left-sided chest wall pain 06/04/2018  . Numbness and tingling of right hand 08/10/2017  . Skin abnormalities 06/13/2017  . History of total hip arthroplasty 03/28/2017  . Symptomatic anemia 02/22/2017  . Failed total hip arthroplasty ([place]) 02/14/2017  . Osteoarthritis of left hip 12/10/2016  . Acute right ankle pain 11/07/2016  . Acute pain of left shoulder 10/04/2016  . Chest pain 07/30/2016  . History of DVT (deep vein thrombosis) 07/30/2016  . History of TIA (transient ischemic attack) 07/30/2016  . Thrombocytopenia ([place]) 07/30/2016  . Carotid arterial disease ([place]) 07/20/2016  . Right sided weakness 06/27/2016  . Slurred speech 06/27/2016  . Left wrist pain 08/11/2015  . Frequent headaches  06/22/2015  . Prediabetes 02/11/2015  . Peroneal tendonitis of right lower extremity 01/12/2014  . Mass of right ankle 12/31/2013  . Ankle gives way, right 12/31/2013  . Radial head fracture, closed 09/18/2013  . Solitary pulmonary nodule 08/08/2012  . SPINAL STENOSIS, LUMBAR 04/06/2010  . LOW BACK PAIN SYNDROME 11/02/2009  . FOOT DROP, RIGHT 05/21/2008  . Hyperlipidemia 12/25/2006  . GERD 12/25/2006  . PROSTATE CANCER, HX OF 12/25/2006  . POLYP, COLON 05/02/2006    Reviewed chart for medication changes ahead of medication coordination call.  No OVs, Consults, or hospital visits since last care coordination call/Pharmacist visit. (If appropriate, list visit date, provider name)  No medication changes indicated OR if recent visit, treatment plan here.  BP Readings from Last 3 Encounters:  12/24/19 130/80  10/31/19 (!) 150/82  08/20/19 (!) 148/70    Lab Results  Component Value Date   HGBA1C 6.1 12/24/2019     Patient obtains medications through Adherence Packaging  90 Days   Last adherence delivery included:      Atorvastatin 40mg  Take one tab by mouth every morning     Clopidogrel 75mg  Take one tab by mouth every morning     Vitamin C 500mg  Take one tab every morning      Fish oil 500mg  Take one capsule every morning      Mybetriq 50mg  Take one tab every morning   Patient is due for next adherence delivery on: 05-03-20. Called patient and reviewed medications and coordinated delivery.  This delivery  to include:     Atorvastatin 40mg  Take one tab by mouth every morning     Clopidogrel 75mg  Take one tab by mouth every morning     Vitamin C 500mg  Take one tab every morning     Fish oil 500mg  Take one capsule every morning   Patient declined the following medications :     Mybetriq 50mg  Take one tab every morning   Confirmed delivery date of 05-03-20, advised patient that pharmacy will contact them the morning of delivery.  Georgiana Shore ,[place]  Pharmacist Assistant [phone]  Follow-Up:  Pharmacist Review

## 2020-05-04 ENCOUNTER — Emergency Department (HOSPITAL_COMMUNITY): Payer: Medicare Other

## 2020-05-04 ENCOUNTER — Telehealth: Payer: Self-pay | Admitting: Internal Medicine

## 2020-05-04 ENCOUNTER — Encounter (HOSPITAL_COMMUNITY): Payer: Self-pay | Admitting: Emergency Medicine

## 2020-05-04 ENCOUNTER — Emergency Department (HOSPITAL_COMMUNITY)
Admission: EM | Admit: 2020-05-04 | Discharge: 2020-05-04 | Disposition: A | Discharge status: Home / Self Care | Payer: Medicare Other | Attending: Emergency Medicine | Admitting: Emergency Medicine

## 2020-05-04 DIAGNOSIS — I10 Essential (primary) hypertension: Secondary | ICD-10-CM | POA: Diagnosis not present

## 2020-05-04 DIAGNOSIS — R918 Other nonspecific abnormal finding of lung field: Secondary | ICD-10-CM

## 2020-05-04 DIAGNOSIS — Z8546 Personal history of malignant neoplasm of prostate: Secondary | ICD-10-CM | POA: Insufficient documentation

## 2020-05-04 DIAGNOSIS — R42 Dizziness and giddiness: Secondary | ICD-10-CM | POA: Insufficient documentation

## 2020-05-04 DIAGNOSIS — R1010 Upper abdominal pain, unspecified: Secondary | ICD-10-CM | POA: Diagnosis not present

## 2020-05-04 DIAGNOSIS — K219 Gastro-esophageal reflux disease without esophagitis: Secondary | ICD-10-CM | POA: Insufficient documentation

## 2020-05-04 DIAGNOSIS — R911 Solitary pulmonary nodule: Secondary | ICD-10-CM | POA: Diagnosis not present

## 2020-05-04 DIAGNOSIS — Z79899 Other long term (current) drug therapy: Secondary | ICD-10-CM | POA: Diagnosis not present

## 2020-05-04 DIAGNOSIS — Z87891 Personal history of nicotine dependence: Secondary | ICD-10-CM | POA: Diagnosis not present

## 2020-05-04 DIAGNOSIS — Z96649 Presence of unspecified artificial hip joint: Secondary | ICD-10-CM | POA: Insufficient documentation

## 2020-05-04 DIAGNOSIS — R0602 Shortness of breath: Secondary | ICD-10-CM | POA: Insufficient documentation

## 2020-05-04 DIAGNOSIS — J9811 Atelectasis: Secondary | ICD-10-CM | POA: Diagnosis not present

## 2020-05-04 DIAGNOSIS — R109 Unspecified abdominal pain: Secondary | ICD-10-CM | POA: Insufficient documentation

## 2020-05-04 DIAGNOSIS — J432 Centrilobular emphysema: Secondary | ICD-10-CM | POA: Diagnosis not present

## 2020-05-04 DIAGNOSIS — R079 Chest pain, unspecified: Secondary | ICD-10-CM | POA: Insufficient documentation

## 2020-05-04 DIAGNOSIS — R0789 Other chest pain: Secondary | ICD-10-CM | POA: Diagnosis not present

## 2020-05-04 LAB — CBC
HCT: 39.8 % (ref 39.0–52.0)
Hemoglobin: 12.4 g/dL — ABNORMAL LOW (ref 13.0–17.0)
MCH: 28.2 pg (ref 26.0–34.0)
MCHC: 31.2 g/dL (ref 30.0–36.0)
MCV: 90.7 fL (ref 80.0–100.0)
Platelets: 123 10*3/uL — ABNORMAL LOW (ref 150–400)
RBC: 4.39 MIL/uL (ref 4.22–5.81)
RDW: 14.8 % (ref 11.5–15.5)
WBC: 4.9 10*3/uL (ref 4.0–10.5)
nRBC: 0 % (ref 0.0–0.2)

## 2020-05-04 LAB — LIPASE, BLOOD: Lipase: 35 U/L (ref 11–51)

## 2020-05-04 LAB — BASIC METABOLIC PANEL WITH GFR
Anion gap: 10 (ref 5–15)
BUN: 17 mg/dL (ref 8–23)
CO2: 25 mmol/L (ref 22–32)
Calcium: 9.2 mg/dL (ref 8.9–10.3)
Chloride: 106 mmol/L (ref 98–111)
Creatinine, Ser: 0.83 mg/dL (ref 0.61–1.24)
GFR, Estimated: >60 mL/min
Glucose, Bld: 101 mg/dL — ABNORMAL HIGH (ref 70–99)
Potassium: 3.9 mmol/L (ref 3.5–5.1)
Sodium: 141 mmol/L (ref 135–145)

## 2020-05-04 LAB — TROPONIN I (HIGH SENSITIVITY)
Troponin I (High Sensitivity): 5 ng/L
Troponin I (High Sensitivity): 4 ng/L

## 2020-05-04 MED ORDER — IOHEXOL 300 MG/ML  SOLN
100.0000 mL | Freq: Once | INTRAMUSCULAR | Status: AC | PRN
  Administered 2020-05-04: 100 mL via INTRAVENOUS

## 2020-05-04 NOTE — Telephone Encounter (Signed)
[facility] FYI  Caller states he has been having chest and stomach pains for the last couple weeks. Reports that he has a dizzy spell on Sunday and fell getting out of the shower. Reports that his chest is hurting today.  Advised to go to ED now. Patient understood and decided to go to [facility].

## 2020-05-04 NOTE — Telephone Encounter (Signed)
Patient called and said that a week or two ago he started to experience chest and stomach pains. He also said that he was dizzy and that had been going on for a few months. Transferred to [facility].

## 2020-05-04 NOTE — ED Triage Notes (Signed)
Per pt, states he has been having abdominal pain and left sided CP on and off for 2 weeks-states it causes him to be dizzy and SOB-states he fell on Sunday due to being dizzy-has not seen MD for sympotms

## 2020-05-04 NOTE — Discharge Instructions (Signed)
The testing did not show any serious problems with your chest or abdomen.  For pain use Tylenol.  CT scan did show some nodules in the chest that we recommend getting a follow-up CT scan done in a year or so.  Your primary care doctor can help you with that.  Continue usual activities and diet.

## 2020-05-04 NOTE — ED Provider Notes (Signed)
[place] DEPT Provider Note   CSN: [id number] Arrival date & time: 05/04/20  1135     History Chief Complaint  Patient presents with  . Abdominal Pain  . Chest Pain    Ronald Stafford is a 87 y.o. male.  HPI Is here to be evaluated for multiple problems including chest pain, abdominal pain, dizziness, and shortness of breath.  Also he fell because of dizziness, several days ago.  He states he has been having left-sided chest pain for several months, and it worsened in the last week and is more central at this time.  He denies nausea, vomiting, diarrhea or constipation.  He denies fever, chills, cough or shortness of breath.  He states he has chronic ongoing dizziness which makes walking difficult especially when he is outside.  Several days ago he fell and broke his glasses but did not have any personal injuries.  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  There are no other known active modifying factors.    Past Medical History:  Diagnosis Date  . Cancer [facility]) 2004   prostate cancer, Dr. Risa Grill  . Colon polyps   . Deep venous thrombosis ([place]) 1997   Postop  . Depression   . GERD (gastroesophageal reflux disease)   . Hyperlipidemia   . Hypertension   . Shoulder fracture, left 02/23/2011   immobilized in sling , Dr Norris(no surgery)    Patient Active Problem List   Diagnosis Date Noted  . Nonintractable headache 11/02/2019  . Rib pain on right side 08/20/2019  . Acute pain of right shoulder 07/10/2019  . Lightheadedness 01/03/2019  . Urinary incontinence 10/31/2018  . Left-sided chest wall pain 06/04/2018  . Numbness and tingling of right hand 08/10/2017  . Skin abnormalities 06/13/2017  . History of total hip arthroplasty 03/28/2017  . Symptomatic anemia 02/22/2017  . Failed total hip arthroplasty ([place]) 02/14/2017  . Osteoarthritis of left hip 12/10/2016  . Acute right ankle pain 11/07/2016  . Acute pain of left shoulder  10/04/2016  . Chest pain 07/30/2016  . History of DVT (deep vein thrombosis) 07/30/2016  . History of TIA (transient ischemic attack) 07/30/2016  . Thrombocytopenia ([place]) 07/30/2016  . Carotid arterial disease ([place]) 07/20/2016  . Right sided weakness 06/27/2016  . Slurred speech 06/27/2016  . Left wrist pain 08/11/2015  . Frequent headaches 06/22/2015  . Prediabetes 02/11/2015  . Peroneal tendonitis of right lower extremity 01/12/2014  . Mass of right ankle 12/31/2013  . Ankle gives way, right 12/31/2013  . Radial head fracture, closed 09/18/2013  . Solitary pulmonary nodule 08/08/2012  . SPINAL STENOSIS, LUMBAR 04/06/2010  . LOW BACK PAIN SYNDROME 11/02/2009  . FOOT DROP, RIGHT 05/21/2008  . Hyperlipidemia 12/25/2006  . GERD 12/25/2006  . PROSTATE CANCER, HX OF 12/25/2006  . POLYP, COLON 05/02/2006    Past Surgical History:  Procedure Laterality Date  . COLONOSCOPY  2006   Negative,Dr.Edwards  . colonoscopy with polypectomy      PMH of  . ESOPHAGOGASTRODUODENOSCOPY N/A 02/23/2017   Procedure: ESOPHAGOGASTRODUODENOSCOPY (EGD);  Surgeon: Otis Brace, MD;  Location: [facility];  Service: Gastroenterology;  Laterality: N/A;  . JOINT REPLACEMENT  1997, 2001   Total Hip replacement X 2  . LUMBAR SPINE SURGERY  09/28/2010   Dr Rolena Infante; for spinal stenosis  . MECKEL DIVERTICULUM EXCISION     Age 69  . PROSTATECTOMY  2004   w/ radiation, Dr Risa Grill  . TOTAL HIP REVISION Left 02/14/2017  Procedure: Left hip polyethylene EXCHANGE;  Surgeon: Gaynelle Arabian, MD;  Location: [facility];  Service: Orthopedics;  Laterality: Left;  [name] VASECTOMY         Family History  Problem Relation Age of Onset  . Prostate cancer Father   . Testicular cancer Son     Social History   Tobacco Use  . Smoking status: Former Smoker    Packs/day: 0.50    Years: 5.00    Pack years: 2.50    Types: Cigarettes    Quit date: 03/13/1988    Years since quitting: 32.1  . Smokeless tobacco: Former  Systems developer  . Tobacco comment: Quit at about age 66 / chewed tobacca x 1 year  Vaping Use  . Vaping Use: Never used  Substance Use Topics  . Alcohol use: No    Alcohol/week: 0.0 standard drinks  . Drug use: No    Home Medications Prior to Admission medications   Medication Sig Start Date End Date Taking? Authorizing Provider  acetaminophen (TYLENOL) 325 MG tablet Take 650 mg by mouth every 6 (six) hours as needed for moderate pain or headache.    Yes [provider]  atorvastatin (LIPITOR) 40 MG tablet TAKE ONE TABLET BY MOUTH EVERY MORNING 03/22/20  Yes Burns, Claudina Lick, MD  clopidogrel (PLAVIX) 75 MG tablet TAKE ONE TABLET BY MOUTH EVERY MORNING 12/31/19  Yes Burns, Claudina Lick, MD  MYRBETRIQ 50 MG TB24 tablet TAKE ONE TABLET BY MOUTH EVERY MORNING 03/22/20  Yes Burns, Claudina Lick, MD  Omega-3 Fatty Acids (FISH OIL) 500 MG CAPS Take 1 capsule by mouth daily.   Yes [provider]  vitamin C (ASCORBIC ACID) 250 MG tablet Take 250 mg by mouth daily.   Yes [provider]    Allergies    Chicken allergy  Review of Systems   Review of Systems  All other systems reviewed and are negative.   Physical Exam Updated Vital Signs BP (!) 156/91   Pulse 87   Temp 97.7 F (36.5 C) (Oral)   Resp 18   SpO2 97%   Physical Exam Vitals and nursing note reviewed.  Constitutional:      General: He is not in acute distress.    Appearance: He is well-developed and well-nourished. He is not ill-appearing or toxic-appearing.  HENT:     Head: Normocephalic and atraumatic.     Right Ear: External ear normal.     Left Ear: External ear normal.  Eyes:     Extraocular Movements: EOM normal.     Conjunctiva/sclera: Conjunctivae normal.     Pupils: Pupils are equal, round, and reactive to light.  Neck:     Trachea: Phonation normal.  Cardiovascular:     Rate and Rhythm: Normal rate and regular rhythm.     Heart sounds: Normal heart sounds.  Pulmonary:     Effort: Pulmonary effort  is normal.     Breath sounds: Normal breath sounds.  Chest:     Chest wall: Tenderness (Mild tenderness left anterior chest wall without crepitation or deformity.) present. No bony tenderness.  Abdominal:     General: There is no distension.     Palpations: Abdomen is soft. There is no mass.     Tenderness: There is abdominal tenderness (Diffuse epigastric discomfort without palpable mass or deformity). There is no guarding.     Hernia: No hernia is present.  Musculoskeletal:        General: No swelling or tenderness. Normal range of  motion.     Cervical back: Normal range of motion and neck supple.     Right lower leg: No edema.     Left lower leg: No edema.  Skin:    General: Skin is warm, dry and intact.  Neurological:     Mental Status: He is alert and oriented to person, place, and time.     Cranial Nerves: No cranial nerve deficit.     Sensory: No sensory deficit.     Motor: No abnormal muscle tone.     Coordination: Coordination normal.  Psychiatric:        Mood and Affect: Mood and affect and mood normal.        Behavior: Behavior normal.        Thought Content: Thought content normal.        Judgment: Judgment normal.     ED Results / Procedures / Treatments   Labs (all labs ordered are listed, but only abnormal results are displayed) Labs Reviewed  BASIC METABOLIC PANEL - Abnormal; Notable for the following components:      Result Value   Glucose, Bld 101 (*)    All other components within normal limits  CBC - Abnormal; Notable for the following components:   Hemoglobin 12.4 (*)    Platelets 123 (*)    All other components within normal limits  LIPASE, BLOOD  TROPONIN I (HIGH SENSITIVITY)  TROPONIN I (HIGH SENSITIVITY)    EKG EKG Interpretation  Date/Time:  Tuesday May 04 2020 11:42:06 EST Ventricular Rate:  87 PR Interval:    QRS Duration: 82 QT Interval:  354 QTC Calculation: 426 R Axis:   69 Text Interpretation: Sinus rhythm Since last  tracing rate slower Otherwise no significant change Confirmed by Daleen Bo [phone]) on 05/04/2020 12:50:20 PM   Radiology DG Chest 2 View  Result Date: 05/04/2020 CLINICAL DATA:  History of pulmonary embolus with new onset chest pain and discomfort over the last 2 weeks EXAM: CHEST - 2 VIEW COMPARISON:  Chest radiograph August 20, 2019. FINDINGS: The heart size and mediastinal contours are within normal limits. Aortic atherosclerosis. No focal consolidation. No pleural effusion. No pneumothorax. Remote bilateral rib fractures. Thoracic spondylosis. IMPRESSION: No acute cardiopulmonary disease. Electronically Signed   By: Dahlia Bailiff MD   On: 05/04/2020 12:23   CT Chest W Contrast  Result Date: 05/04/2020 CLINICAL DATA:  Abdominal pain and left-sided chest pain on and off for 2 weeks. History of prostate cancer. EXAM: CT CHEST, ABDOMEN, AND PELVIS WITH CONTRAST TECHNIQUE: Multidetector CT imaging of the chest, abdomen and pelvis was performed following the standard protocol during bolus administration of intravenous contrast. CONTRAST:  145mL OMNIPAQUE IOHEXOL 300 MG/ML  SOLN COMPARISON:  CT chest dated 01/23/2018. CT abdomen pelvis dated 12/14/2011. FINDINGS: CT CHEST FINDINGS Cardiovascular: The right and left pulmonary arteries are enlarged, measuring 2.5 and 2.6 cm respectively, suggestive of pulmonary hypertension. Vascular calcifications are seen in the aortic arch. Normal heart size. No pericardial effusion. Mediastinum/Nodes: No enlarged mediastinal, hilar, or axillary lymph nodes. Thyroid gland, trachea, and esophagus demonstrate no significant findings. Lungs/Pleura: There is mild bibasilar atelectasis. Three nearby solid pulmonary nodules in the right upper lobe measure 5 mm, 4 mm, and 3 mm (series 6, images 66, 64, and 65 respectively). A 3 mm solid pulmonary nodule in left lung apex is unchanged since 01/23/2018 and therefore benign. Centrilobular emphysema is noted. No pleural effusion or  pneumothorax. Musculoskeletal: No chest wall mass or suspicious bone lesions identified.  CT ABDOMEN PELVIS FINDINGS Hepatobiliary: No focal liver abnormality is seen. No gallstones, gallbladder wall thickening, or biliary dilatation. Pancreas: Unremarkable. No pancreatic ductal dilatation or surrounding inflammatory changes. Spleen: Normal in size without focal abnormality. Adrenals/Urinary Tract: Adrenal glands are unremarkable. Focal hypoattenuation of the renal cortex along the posterior aspect of the right kidney is indeterminate (series 2, image 73) and may represent sequela of prior injury/scarring, however an underlying mass is difficult to exclude. Left renal cysts measure up to 5.2 cm. No renal calculi or hydronephrosis on either side. Bladder is unremarkable. Stomach/Bowel: Stomach is within normal limits. No pericecal inflammatory changes are noted to suggest acute appendicitis. There is colonic diverticulosis without evidence of diverticulitis. No evidence of bowel wall thickening, distention, or inflammatory changes. Vascular/Lymphatic: Aortic atherosclerosis. No enlarged abdominal or pelvic lymph nodes. Reproductive: The prostate appears absent. Other: No abdominal wall hernia or abnormality. No abdominopelvic ascites. Musculoskeletal: A left hip arthroplasty results in streak artifact which obscures portions of the pelvis. Degenerative changes are seen in the spine. Post-laminectomy changes are seen at L4. IMPRESSION: 1. No acute findings in the chest, abdomen, or pelvis. 2. Three solid pulmonary nodules in the right upper lobe are indeterminate. Non-contrast chest CT can be considered in 12 months given the patient's history of prostate cancer. This recommendation follows the consensus statement: Guidelines for Management of Incidental Pulmonary Nodules Detected on CT Images: From the Fleischner Society 2017; Radiology 2017; 284:228-243. 3. Focal hypoattenuation of the renal cortex along the  posterior aspect of the right kidney is indeterminate and may represent sequela of prior injury/scarring, however an underlying mass is difficult to exclude. Non emergent MR abdomen could be obtained for further evaluation. Aortic Atherosclerosis (ICD10-I70.0) and Emphysema (ICD10-J43.9). Electronically Signed   By: Zerita Boers M.D.   On: 05/04/2020 15:16   CT Abdomen Pelvis W Contrast  Result Date: 05/04/2020 CLINICAL DATA:  Abdominal pain and left-sided chest pain on and off for 2 weeks. History of prostate cancer. EXAM: CT CHEST, ABDOMEN, AND PELVIS WITH CONTRAST TECHNIQUE: Multidetector CT imaging of the chest, abdomen and pelvis was performed following the standard protocol during bolus administration of intravenous contrast. CONTRAST:  127mL OMNIPAQUE IOHEXOL 300 MG/ML  SOLN COMPARISON:  CT chest dated 01/23/2018. CT abdomen pelvis dated 12/14/2011. FINDINGS: CT CHEST FINDINGS Cardiovascular: The right and left pulmonary arteries are enlarged, measuring 2.5 and 2.6 cm respectively, suggestive of pulmonary hypertension. Vascular calcifications are seen in the aortic arch. Normal heart size. No pericardial effusion. Mediastinum/Nodes: No enlarged mediastinal, hilar, or axillary lymph nodes. Thyroid gland, trachea, and esophagus demonstrate no significant findings. Lungs/Pleura: There is mild bibasilar atelectasis. Three nearby solid pulmonary nodules in the right upper lobe measure 5 mm, 4 mm, and 3 mm (series 6, images 66, 64, and 65 respectively). A 3 mm solid pulmonary nodule in left lung apex is unchanged since 01/23/2018 and therefore benign. Centrilobular emphysema is noted. No pleural effusion or pneumothorax. Musculoskeletal: No chest wall mass or suspicious bone lesions identified. CT ABDOMEN PELVIS FINDINGS Hepatobiliary: No focal liver abnormality is seen. No gallstones, gallbladder wall thickening, or biliary dilatation. Pancreas: Unremarkable. No pancreatic ductal dilatation or surrounding  inflammatory changes. Spleen: Normal in size without focal abnormality. Adrenals/Urinary Tract: Adrenal glands are unremarkable. Focal hypoattenuation of the renal cortex along the posterior aspect of the right kidney is indeterminate (series 2, image 73) and may represent sequela of prior injury/scarring, however an underlying mass is difficult to exclude. Left renal cysts measure up to 5.2 cm. No  renal calculi or hydronephrosis on either side. Bladder is unremarkable. Stomach/Bowel: Stomach is within normal limits. No pericecal inflammatory changes are noted to suggest acute appendicitis. There is colonic diverticulosis without evidence of diverticulitis. No evidence of bowel wall thickening, distention, or inflammatory changes. Vascular/Lymphatic: Aortic atherosclerosis. No enlarged abdominal or pelvic lymph nodes. Reproductive: The prostate appears absent. Other: No abdominal wall hernia or abnormality. No abdominopelvic ascites. Musculoskeletal: A left hip arthroplasty results in streak artifact which obscures portions of the pelvis. Degenerative changes are seen in the spine. Post-laminectomy changes are seen at L4. IMPRESSION: 1. No acute findings in the chest, abdomen, or pelvis. 2. Three solid pulmonary nodules in the right upper lobe are indeterminate. Non-contrast chest CT can be considered in 12 months given the patient's history of prostate cancer. This recommendation follows the consensus statement: Guidelines for Management of Incidental Pulmonary Nodules Detected on CT Images: From the Fleischner Society 2017; Radiology 2017; 284:228-243. 3. Focal hypoattenuation of the renal cortex along the posterior aspect of the right kidney is indeterminate and may represent sequela of prior injury/scarring, however an underlying mass is difficult to exclude. Non emergent MR abdomen could be obtained for further evaluation. Aortic Atherosclerosis (ICD10-I70.0) and Emphysema (ICD10-J43.9). Electronically Signed    By: Zerita Boers M.D.   On: 05/04/2020 15:16    Procedures Procedures   Medications Ordered in ED Medications  iohexol (OMNIPAQUE) 300 MG/ML solution 100 mL (100 mLs Intravenous Contrast Given 05/04/20 1410)    ED Course  I have reviewed the triage vital signs and the nursing notes.  Pertinent labs & imaging results that were available during my care of the patient were reviewed by me and considered in my medical decision making (see chart for details).    MDM Rules/Calculators/A&P                           Patient Vitals for the past 24 hrs:  BP Temp Temp src Pulse Resp SpO2  05/04/20 1518 (!) 156/91 -- -- 87 18 97 %  05/04/20 1400 (!) 154/91 -- -- 85 20 93 %  05/04/20 1330 (!) 163/71 -- -- 78 15 92 %  05/04/20 1255 (!) 181/94 97.7 F (36.5 C) Oral 81 15 98 %  05/04/20 1143 (!) 178/83 97.6 F (36.4 C) Oral 72 19 98 %    3:40 PM Reevaluation with update and discussion. After initial assessment and treatment, an updated evaluation reveals he is comfortable has no further complaints.  I discussed with the patient including mildly elevated blood pressure and recommendation to follow-up with his PCP for blood pressure check in a week or so.  All questions answered. Daleen Bo   Medical Decision Making:  This patient is presenting for evaluation of ongoing chest pain and abdominal pain, which does require a range of treatment options, and is a complaint that involves a moderate risk of morbidity and mortality. The differential diagnoses include disorders of esophagus, stomach, intestines, neoplastic disease, occult injuries. I decided to review old records, and in summary elderly male with ongoing chest discomfort and falling.  Also has abdominal pain on exam.  I did not require additional historical information from anyone.  Clinical Laboratory Tests Ordered, included CBC, Metabolic panel and Troponin, lipase. Review indicates reassuring/normal. Radiologic Tests Ordered,  included CT chest and abdomen.  I independently Visualized: Radiograph images, which show no acute abnormalities.  Right upper lung pulmonary nodules, nonspecific; follow-up 1 year for stability check  Cardiac Monitor Tracing which shows normal sinus rhythm     Critical Interventions-clinical evaluation, laboratory testing, radiologic imaging, observation reassessment  After These Interventions, the Patient was reevaluated and was found stable for discharge.  Nonspecific pain, can be treated symptomatically.  Patient's work-up is very reassuring today.  Mild incidental elevation of blood pressure can be followed up expectantly by PCP.  Recommend 1 year follow-up for pulmonary nodules, to ensure stability.  CRITICAL CARE-no Performed by: Daleen Bo  Nursing Notes Reviewed/ Care Coordinated Applicable Imaging Reviewed Interpretation of Laboratory Data incorporated into ED treatment  The patient appears reasonably screened and/or stabilized for discharge and I doubt any other medical condition or other [facility] requiring further screening, evaluation, or treatment in the ED at this time prior to discharge.  Plan: Home Medications-continue usual; Home Treatments-rest, fluids; return here if the recommended treatment, does not improve the symptoms; Recommended follow up-PCP, PRN     Final Clinical Impression(s) / ED Diagnoses Final diagnoses:  Chest pain, unspecified type  Abdominal pain, unspecified abdominal location  Lung nodules    Rx / DC Orders ED Discharge Orders    None       Daleen Bo, MD 05/04/20 1542

## 2020-05-19 ENCOUNTER — Telehealth: Payer: Self-pay | Admitting: Pharmacist

## 2020-05-19 NOTE — Progress Notes (Signed)
    Chronic Care Management Pharmacy Assistant   Name: Ronald Stafford  MRN: [id number] DOB: [birth year]  Reason for Encounter: Medication Review    Medications: Outpatient Encounter Medications as of 05/19/2020  Medication Sig  . acetaminophen (TYLENOL) 325 MG tablet Take 650 mg by mouth every 6 (six) hours as needed for moderate pain or headache.   [name] atorvastatin (LIPITOR) 40 MG tablet TAKE ONE TABLET BY MOUTH EVERY MORNING  . clopidogrel (PLAVIX) 75 MG tablet TAKE ONE TABLET BY MOUTH EVERY MORNING  . MYRBETRIQ 50 MG TB24 tablet TAKE ONE TABLET BY MOUTH EVERY MORNING  . Omega-3 Fatty Acids (FISH OIL) 500 MG CAPS Take 1 capsule by mouth daily.  . vitamin C (ASCORBIC ACID) 250 MG tablet Take 250 mg by mouth daily.   No facility-administered encounter medications on file as of 05/19/2020.    Reviewed chart for medication changes ahead of medication coordination call.  No OVs, Consults, or hospital visits since last care coordination call/Pharmacist visit. (If appropriate, list visit date, provider name)  No medication changes indicated OR if recent visit, treatment plan here.  BP Readings from Last 3 Encounters:  05/04/20 (!) 165/90  12/24/19 130/80  10/31/19 (!) 150/82    Lab Results  Component Value Date   HGBA1C 6.1 12/24/2019     Patient obtains medications through Adherence Packaging  90 Days   Last adherence delivery included:  Vitamin C 500 mg Tab Take one tab by mouth every morning Fish Oil 500 mg Softgel Take one capsul by mouth every morning Clopidogrel 75 mg Take one tab by mouth every morning Atorvastatin 40 mg Take one tab by mouth every morning   Patient is due for next adherence delivery on: 05-27-20. Called patient and reviewed medications . Patient is not due for any medications at this time. Patients next refill for medication is in May 2022.   Patient declined the following medications:  Mybetriq 50mg  Take one tab by mouth every morning   Georgiana Shore  ,[place] Pharmacist Assistant [phone]   Time Spent : 24 minutes

## 2020-05-31 NOTE — Patient Instructions (Addendum)
    Medications changes include :   Start gabapentin 100 mg at night.     If your symptoms get worse or persist let me know so I can refer you to Dr Tomi Likens.

## 2020-05-31 NOTE — Progress Notes (Signed)
Subjective:    Patient ID: Ronald Stafford, male    DOB: October 03, 1930, 87 y.o.   MRN: [id number]  HPI The patient is here for an acute visit.   Neck pain - every morning he wakes up with numbness in his right fingers.  He denies numbness during the day.  He has a hard time buttoning his shirt because of the numbness.  He sleeps in his lounge chair a lot at night.  Last night he put a heating pad on his neck and it helped.  He also put it on his right shoulder and it helped.  He did not wake up this morning with numbness in his fingers and he thinks it was because of the heating pad.  He denies pain in right arm, except for the shoulder.    He has headaches on the left side of his head - feels like it comes up from the neck.  It is a sharp pain.  It lasts 10-15 min and then it is gone.  The neck pain and the headaches are chronic.  Several years ago he did see Dr. Tomi Likens for this and had imaging of both his neck and brain.  At that time he was advised to start gabapentin.  He does not recall if that helped.  He also saw neurosurgery at that time for abnormal findings in his neck and he did not feel like any surgery was necessary.  He had difficulty remembering things two days ago.  He was ok at church.  After church he could not remember how to get to the restaurant. The memory issue lasted about 10 minutes and then he was fine.  He denies issues with speech, strength/N/T at the time.  No prior episodes.       He gets lightheaded when he stands and that has caused him to fall.  This is not new.  He tries to take caution when he does stand up.  He has poor balance to begin with.    Medications and allergies reviewed with patient and updated if appropriate.  Patient Active Problem List   Diagnosis Date Noted  . Nonintractable headache 11/02/2019  . Rib pain on right side 08/20/2019  . Acute pain of right shoulder 07/10/2019  . Lightheadedness 01/03/2019  . Urinary incontinence 10/31/2018  .  Left-sided chest wall pain 06/04/2018  . Numbness and tingling of right hand 08/10/2017  . Skin abnormalities 06/13/2017  . History of total hip arthroplasty 03/28/2017  . Symptomatic anemia 02/22/2017  . Failed total hip arthroplasty ([place]) 02/14/2017  . Osteoarthritis of left hip 12/10/2016  . Acute right ankle pain 11/07/2016  . Acute pain of left shoulder 10/04/2016  . Chest pain 07/30/2016  . History of DVT (deep vein thrombosis) 07/30/2016  . History of TIA (transient ischemic attack) 07/30/2016  . Thrombocytopenia ([place]) 07/30/2016  . Carotid arterial disease ([place]) 07/20/2016  . Right sided weakness 06/27/2016  . Slurred speech 06/27/2016  . Left wrist pain 08/11/2015  . Frequent headaches 06/22/2015  . Prediabetes 02/11/2015  . Peroneal tendonitis of right lower extremity 01/12/2014  . Mass of right ankle 12/31/2013  . Ankle gives way, right 12/31/2013  . Radial head fracture, closed 09/18/2013  . Solitary pulmonary nodule 08/08/2012  . SPINAL STENOSIS, LUMBAR 04/06/2010  . LOW BACK PAIN SYNDROME 11/02/2009  . FOOT DROP, RIGHT 05/21/2008  . Hyperlipidemia 12/25/2006  . GERD 12/25/2006  . PROSTATE CANCER, HX OF 12/25/2006  . POLYP, COLON 05/02/2006  Current Outpatient Medications on File Prior to Visit  Medication Sig Dispense Refill  . acetaminophen (TYLENOL) 325 MG tablet Take 650 mg by mouth every 6 (six) hours as needed for moderate pain or headache.     [name] atorvastatin (LIPITOR) 40 MG tablet TAKE ONE TABLET BY MOUTH EVERY MORNING 90 tablet 1  . clopidogrel (PLAVIX) 75 MG tablet TAKE ONE TABLET BY MOUTH EVERY MORNING 90 tablet 5  . Omega-3 Fatty Acids (FISH OIL) 500 MG CAPS Take 1 capsule by mouth daily.    . vitamin C (ASCORBIC ACID) 250 MG tablet Take 250 mg by mouth daily.     No current facility-administered medications on file prior to visit.    Past Medical History:  Diagnosis Date  . Cancer [facility]) 2004   prostate cancer, Dr. Risa Grill  . Colon polyps   .  Deep venous thrombosis ([place]) 1997   Postop  . Depression   . GERD (gastroesophageal reflux disease)   . Hyperlipidemia   . Hypertension   . Shoulder fracture, left 02/23/2011   immobilized in sling , Dr Norris(no surgery)    Past Surgical History:  Procedure Laterality Date  . COLONOSCOPY  2006   Negative,Dr.Edwards  . colonoscopy with polypectomy      PMH of  . ESOPHAGOGASTRODUODENOSCOPY N/A 02/23/2017   Procedure: ESOPHAGOGASTRODUODENOSCOPY (EGD);  Surgeon: Otis Brace, MD;  Location: [facility];  Service: Gastroenterology;  Laterality: N/A;  . JOINT REPLACEMENT  1997, 2001   Total Hip replacement X 2  . LUMBAR SPINE SURGERY  09/28/2010   Dr Rolena Infante; for spinal stenosis  . MECKEL DIVERTICULUM EXCISION     Age 73  . PROSTATECTOMY  2004   w/ radiation, Dr Risa Grill  . TOTAL HIP REVISION Left 02/14/2017   Procedure: Left hip polyethylene EXCHANGE;  Surgeon: Gaynelle Arabian, MD;  Location: [facility];  Service: Orthopedics;  Laterality: Left;  [name] VASECTOMY      Social History   Socioeconomic History  . Marital status: Widowed    Spouse name: Not on file  . Number of children: 2  . Years of education: Not on file  . Highest education level: Not on file  Occupational History  . Occupation: retired  Tobacco Use  . Smoking status: Former Smoker    Packs/day: 0.50    Years: 5.00    Pack years: 2.50    Types: Cigarettes    Quit date: 03/13/1988    Years since quitting: 32.2  . Smokeless tobacco: Former Systems developer  . Tobacco comment: Quit at about age 68 / chewed tobacca x 1 year  Vaping Use  . Vaping Use: Never used  Substance and Sexual Activity  . Alcohol use: No    Alcohol/week: 0.0 standard drinks  . Drug use: No  . Sexual activity: Never  Other Topics Concern  . Not on file  Social History Narrative   Lives alone; Wife passed Dec 05, 2012;   Died of ovarian cancer; breast cancer; other cancer;      Social Determinants of Health   Financial Resource Strain: Low  Risk   . Difficulty of Paying Living Expenses: Not very hard  Food Insecurity: Not on file  Transportation Needs: No Transportation Needs  . Lack of Transportation (Medical): No  . Lack of Transportation (Non-Medical): No  Physical Activity: Not on file  Stress: Not on file  Social Connections: Moderately Isolated  . Frequency of Communication with Friends and Family: More than three times a week  . Frequency of  Social Gatherings with Friends and Family: More than three times a week  . Attends Religious Services: More than 4 times per year  . Active Member of Clubs or Organizations: No  . Attends Archivist Meetings: Never  . Marital Status: Widowed    Family History  Problem Relation Age of Onset  . Prostate cancer Father   . Testicular cancer Son     Review of Systems  Musculoskeletal: Positive for neck pain.  Neurological: Positive for dizziness, light-headedness, numbness and headaches (left side - from neck).       Objective:   Vitals:   06/01/20 0905  BP: 136/70  Pulse: 90  Temp: 98.2 F (36.8 C)  SpO2: 96%   BP Readings from Last 3 Encounters:  06/01/20 136/70  05/04/20 (!) 165/90  12/24/19 130/80   Wt Readings from Last 3 Encounters:  06/01/20 191 lb (86.6 kg)  12/24/19 190 lb (86.2 kg)  10/31/19 189 lb (85.7 kg)   Body mass index is 24.52 kg/m.   Physical Exam Constitutional:      General: He is not in acute distress.    Appearance: Normal appearance. He is not ill-appearing.  HENT:     Head: Normocephalic and atraumatic.  Cardiovascular:     Rate and Rhythm: Normal rate and regular rhythm.     Heart sounds: No murmur heard.   Pulmonary:     Effort: Pulmonary effort is normal. No respiratory distress.     Breath sounds: No wheezing or rales.  Musculoskeletal:        General: Tenderness (No tenderness cervical spine.  He does have some tenderness bilateral posterior cervical muscles and with palpation of right shoulder.) present. No  deformity.     Right lower leg: No edema.     Left lower leg: No edema.  Skin:    General: Skin is warm and dry.  Neurological:     General: No focal deficit present.     Mental Status: He is alert and oriented to person, place, and time.     Sensory: No sensory deficit.     Motor: No weakness.     Gait: Gait abnormal.  Psychiatric:        Mood and Affect: Mood normal.            Assessment & Plan:    See Problem List for Assessment and Plan of chronic medical problems.    This visit occurred during the SARS-CoV-2 public health emergency.  Safety protocols were in place, including screening questions prior to the visit, additional usage of staff PPE, and extensive cleaning of exam room while observing appropriate contact time as indicated for disinfecting solutions.

## 2020-06-01 ENCOUNTER — Encounter: Payer: Self-pay | Admitting: Internal Medicine

## 2020-06-01 ENCOUNTER — Ambulatory Visit (INDEPENDENT_AMBULATORY_CARE_PROVIDER_SITE_OTHER): Payer: Medicare Other | Admitting: Internal Medicine

## 2020-06-01 ENCOUNTER — Other Ambulatory Visit: Payer: Self-pay

## 2020-06-01 DIAGNOSIS — M5412 Radiculopathy, cervical region: Secondary | ICD-10-CM | POA: Diagnosis not present

## 2020-06-01 DIAGNOSIS — R519 Headache, unspecified: Secondary | ICD-10-CM | POA: Diagnosis not present

## 2020-06-01 DIAGNOSIS — R413 Other amnesia: Secondary | ICD-10-CM | POA: Diagnosis not present

## 2020-06-01 MED ORDER — GABAPENTIN 100 MG PO CAPS: 100.0000 mg | ORAL_CAPSULE | Freq: Every day | ORAL | 3 refills | Status: DC

## 2020-06-01 NOTE — Assessment & Plan Note (Signed)
Chronic neck pain with some radiculopathy-he has numbness in his right fingers when he first wakes up in the morning, but this resolves and he denies any numbness throughout the day Saw Dr. Tomi Likens for this a few years ago and had imaging Saw neurosurgery no surgical intervention was needed He did use a heating pad last night and that seemed to help and he will continue that Start gabapentin at night to see if that helps If symptoms worsen discussed that we may need to do imaging again or have him see Dr. Tomi Likens again

## 2020-06-01 NOTE — Assessment & Plan Note (Signed)
Acute 2 days ago he had difficulty with his memory that lasted about 10 minutes-he was not able to remember how to get some place when he was driving-his girlfriend had to give him directions He had no other deficits at this time He is taking his atorvastatin and Plavix daily as prescribed At this time he has no neurological deficits and his exam is at baseline Discussed that he could have had another TIA Discussed further evaluation by neurology, imaging At this point he would like to just monitor and if it happens again he will let me know Continue Plavix 75 mg daily and atorvastatin 40 mg daily

## 2020-06-01 NOTE — Assessment & Plan Note (Signed)
Chronic Has had headaches for years Headache located on the left side of his head more posteriorly Has seen Dr. Tomi Likens several years ago for this-thought to be related to his neck pain Started on gabapentin at that time, but no longer takes and he does not remember if this helped Restart gabapentin 100 mg nightly-can titrate if tolerated.  Discussed possible side effects Deferred further imaging at this time, but advised that if his symptoms get worse he may need to consider imaging or seeing Dr. Tomi Likens again

## 2020-06-14 NOTE — Patient Instructions (Addendum)
  Give a urine sample was ordered.  Give this at the lab downstairs.    Medications changes include :  none    Your prescription(s) have been submitted to your pharmacy. Please take as directed and contact our office if you believe you are having problem(s) with the medication(s).   An MRI was ordered to evaluate your right kidney more.  Someone will call you to schedule this.     Please followup in 6 months

## 2020-06-14 NOTE — Progress Notes (Signed)
Subjective:    Patient ID: Ronald Stafford, male    DOB: 21-Mar-1930, 87 y.o.   MRN: [id number]  HPI The patient is here for follow up of their chronic medical problems, including prediabetes, hyperlipidemia, h/o TIA, cervical radiculopathy/headaches   A couple of weeks ago we restarted him on gabapentin at night for cervical radiculopathy and headaches. His headaches and neck pain are improved.   He had blood in his bed once.  It was just after he fell and he did hit his back.  He is not sure if it was from his rectum or urine.  He did not see any blood in his stool or urine the next day.     Reviewed Ct from ED.  Medications and allergies reviewed with patient and updated if appropriate.  Patient Active Problem List   Diagnosis Date Noted  . Cervical radiculopathy 06/01/2020  . Memory difficulty 06/01/2020  . Acute pain of right shoulder 07/10/2019  . Lightheadedness 01/03/2019  . Urinary incontinence 10/31/2018  . Left-sided chest wall pain 06/04/2018  . Numbness and tingling of right hand 08/10/2017  . Skin abnormalities 06/13/2017  . History of total hip arthroplasty 03/28/2017  . Symptomatic anemia 02/22/2017  . Failed total hip arthroplasty ([place]) 02/14/2017  . Osteoarthritis of left hip 12/10/2016  . Acute right ankle pain 11/07/2016  . Acute pain of left shoulder 10/04/2016  . Chest pain 07/30/2016  . History of DVT (deep vein thrombosis) 07/30/2016  . History of TIA (transient ischemic attack) 07/30/2016  . Thrombocytopenia ([place]) 07/30/2016  . Carotid arterial disease ([place]) 07/20/2016  . Right sided weakness 06/27/2016  . Slurred speech 06/27/2016  . Left wrist pain 08/11/2015  . Frequent headaches 06/22/2015  . Prediabetes 02/11/2015  . Peroneal tendonitis of right lower extremity 01/12/2014  . Mass of right ankle 12/31/2013  . Ankle gives way, right 12/31/2013  . Radial head fracture, closed 09/18/2013  . Solitary pulmonary nodule 08/08/2012  . SPINAL  STENOSIS, LUMBAR 04/06/2010  . LOW BACK PAIN SYNDROME 11/02/2009  . FOOT DROP, RIGHT 05/21/2008  . Hyperlipidemia 12/25/2006  . GERD 12/25/2006  . PROSTATE CANCER, HX OF 12/25/2006  . POLYP, COLON 05/02/2006    Current Outpatient Medications on File Prior to Visit  Medication Sig Dispense Refill  . acetaminophen (TYLENOL) 325 MG tablet Take 650 mg by mouth every 6 (six) hours as needed for moderate pain or headache.     [name] atorvastatin (LIPITOR) 40 MG tablet TAKE ONE TABLET BY MOUTH EVERY MORNING 90 tablet 1  . clopidogrel (PLAVIX) 75 MG tablet TAKE ONE TABLET BY MOUTH EVERY MORNING 90 tablet 5  . Omega-3 Fatty Acids (FISH OIL) 500 MG CAPS Take 1 capsule by mouth daily.    . vitamin C (ASCORBIC ACID) 250 MG tablet Take 250 mg by mouth daily.     No current facility-administered medications on file prior to visit.    Past Medical History:  Diagnosis Date  . Cancer [facility]) 2004   prostate cancer, Dr. Risa Grill  . Colon polyps   . Deep venous thrombosis ([place]) 1997   Postop  . Depression   . GERD (gastroesophageal reflux disease)   . Hyperlipidemia   . Hypertension   . Shoulder fracture, left 02/23/2011   immobilized in sling , Dr Norris(no surgery)    Past Surgical History:  Procedure Laterality Date  . COLONOSCOPY  2006   Negative,Dr.Edwards  . colonoscopy with polypectomy      PMH of  .  ESOPHAGOGASTRODUODENOSCOPY N/A 02/23/2017   Procedure: ESOPHAGOGASTRODUODENOSCOPY (EGD);  Surgeon: Otis Brace, MD;  Location: [facility];  Service: Gastroenterology;  Laterality: N/A;  . JOINT REPLACEMENT  1997, 2001   Total Hip replacement X 2  . LUMBAR SPINE SURGERY  09/28/2010   Dr Rolena Infante; for spinal stenosis  . MECKEL DIVERTICULUM EXCISION     Age 16  . PROSTATECTOMY  2004   w/ radiation, Dr Risa Grill  . TOTAL HIP REVISION Left 02/14/2017   Procedure: Left hip polyethylene EXCHANGE;  Surgeon: Gaynelle Arabian, MD;  Location: [facility];  Service: Orthopedics;  Laterality: Left;  [name]  VASECTOMY      Social History   Socioeconomic History  . Marital status: Widowed    Spouse name: Not on file  . Number of children: 2  . Years of education: Not on file  . Highest education level: Not on file  Occupational History  . Occupation: retired  Tobacco Use  . Smoking status: Former Smoker    Packs/day: 0.50    Years: 5.00    Pack years: 2.50    Types: Cigarettes    Quit date: 03/13/1988    Years since quitting: 32.2  . Smokeless tobacco: Former Systems developer  . Tobacco comment: Quit at about age 14 / chewed tobacca x 1 year  Vaping Use  . Vaping Use: Never used  Substance and Sexual Activity  . Alcohol use: No    Alcohol/week: 0.0 standard drinks  . Drug use: No  . Sexual activity: Never  Other Topics Concern  . Not on file  Social History Narrative   Lives alone; Wife passed 2012-12-03;   Died of ovarian cancer; breast cancer; other cancer;      Social Determinants of Health   Financial Resource Strain: Low Risk   . Difficulty of Paying Living Expenses: Not very hard  Food Insecurity: Not on file  Transportation Needs: No Transportation Needs  . Lack of Transportation (Medical): No  . Lack of Transportation (Non-Medical): No  Physical Activity: Not on file  Stress: Not on file  Social Connections: Moderately Isolated  . Frequency of Communication with Friends and Family: More than three times a week  . Frequency of Social Gatherings with Friends and Family: More than three times a week  . Attends Religious Services: More than 4 times per year  . Active Member of Clubs or Organizations: No  . Attends Archivist Meetings: Never  . Marital Status: Widowed    Family History  Problem Relation Age of Onset  . Prostate cancer Father   . Testicular cancer Son     Review of Systems  Constitutional: Negative for fever.  Respiratory: Negative for cough, shortness of breath and wheezing.   Cardiovascular: Negative for chest pain, palpitations and leg  swelling.  Gastrointestinal: Negative for blood in stool, constipation and diarrhea.  Genitourinary: Positive for frequency. Negative for difficulty urinating, dysuria and hematuria.  Musculoskeletal: Positive for neck pain.  Neurological: Positive for dizziness (occ) and headaches. Negative for light-headedness.       Objective:   Vitals:   06/15/20 1301  BP: (!) 144/72  Pulse: 96  Temp: 98.1 F (36.7 C)  SpO2: 97%   BP Readings from Last 3 Encounters:  06/15/20 (!) 144/72  06/01/20 136/70  05/04/20 (!) 165/90   Wt Readings from Last 3 Encounters:  06/15/20 189 lb (85.7 kg)  06/01/20 191 lb (86.6 kg)  12/24/19 190 lb (86.2 kg)   Body mass index is  24.27 kg/m.   Physical Exam    Constitutional: Appears well-developed and well-nourished. No distress.  HENT:  Head: Normocephalic and atraumatic.  Neck: Neck supple. No tracheal deviation present. No thyromegaly present.  No cervical lymphadenopathy Cardiovascular: Normal rate, regular rhythm and normal heart sounds.   No murmur heard. No carotid bruit .  No edema Pulmonary/Chest: Effort normal and breath sounds normal. No respiratory distress. No has no wheezes. No rales.  Skin: Skin is warm and dry. Not diaphoretic.  Psychiatric: Normal mood and affect. Behavior is normal.      Assessment & Plan:    See Problem List for Assessment and Plan of chronic medical problems.    This visit occurred during the SARS-CoV-2 public health emergency.  Safety protocols were in place, including screening questions prior to the visit, additional usage of staff PPE, and extensive cleaning of exam room while observing appropriate contact time as indicated for disinfecting solutions.

## 2020-06-15 ENCOUNTER — Ambulatory Visit (INDEPENDENT_AMBULATORY_CARE_PROVIDER_SITE_OTHER): Payer: Medicare Other | Admitting: Internal Medicine

## 2020-06-15 ENCOUNTER — Encounter: Payer: Self-pay | Admitting: Internal Medicine

## 2020-06-15 ENCOUNTER — Other Ambulatory Visit: Payer: Self-pay

## 2020-06-15 VITALS — BP 144/72 | HR 96 | Temp 98.1°F | Ht 74.0 in | Wt 189.0 lb

## 2020-06-15 DIAGNOSIS — N281 Cyst of kidney, acquired: Secondary | ICD-10-CM | POA: Insufficient documentation

## 2020-06-15 DIAGNOSIS — Z8673 Personal history of transient ischemic attack (TIA), and cerebral infarction without residual deficits: Secondary | ICD-10-CM | POA: Diagnosis not present

## 2020-06-15 DIAGNOSIS — R7303 Prediabetes: Secondary | ICD-10-CM

## 2020-06-15 DIAGNOSIS — E7849 Other hyperlipidemia: Secondary | ICD-10-CM

## 2020-06-15 DIAGNOSIS — R310 Gross hematuria: Secondary | ICD-10-CM | POA: Diagnosis not present

## 2020-06-15 DIAGNOSIS — M5412 Radiculopathy, cervical region: Secondary | ICD-10-CM

## 2020-06-15 MED ORDER — GABAPENTIN 100 MG PO CAPS: 100.0000 mg | ORAL_CAPSULE | Freq: Every day | ORAL | 3 refills | Status: DC

## 2020-06-15 NOTE — Assessment & Plan Note (Signed)
Chronic Continue plavix 75 mg daily and atorvastatin 40 mg daily

## 2020-06-15 NOTE — Assessment & Plan Note (Signed)
Chronic Lab Results  Component Value Date   HGBA1C 6.1 12/24/2019   controlled

## 2020-06-15 NOTE — Assessment & Plan Note (Signed)
Chronic Continue atorvastatin 40 mg qd Regular activity and healthy diet encouraged

## 2020-06-15 NOTE — Assessment & Plan Note (Addendum)
Acute He had one episode of blood on his PJs and bed in the middle of the night  - occurred after he fell and hit his back Never saw blood in stool, toilet water or when he wiped.  Did not see blood in urine - ? Where the blood came from -- most likely urine is the etiology given his h/o of urine incontinence Will check UA He would like to monitor for now and he will monitor his urine and stool - if he sees any blood he will let me know

## 2020-06-15 NOTE — Assessment & Plan Note (Signed)
Chronic Has associated headaches Symptoms improved after restarting gabapentin Continue gabapentin 100 mg HS

## 2020-06-15 NOTE — Assessment & Plan Note (Addendum)
hypo attenuation of R kidney on recent Ct scan ? Cyst, tumor, scar Will order MR to evaluate further

## 2020-06-16 LAB — URINALYSIS, ROUTINE W REFLEX MICROSCOPIC
Bilirubin Urine: NEGATIVE
Hgb urine dipstick: NEGATIVE
Ketones, ur: NEGATIVE
Leukocytes,Ua: NEGATIVE
Nitrite: NEGATIVE
RBC / HPF: NONE SEEN (ref 0–?)
Specific Gravity, Urine: 1.02 (ref 1.000–1.030)
Total Protein, Urine: NEGATIVE
Urine Glucose: NEGATIVE
Urobilinogen, UA: 1 (ref 0.0–1.0)
pH: 6 (ref 5.0–8.0)

## 2020-06-18 ENCOUNTER — Telehealth: Payer: Self-pay

## 2020-06-18 NOTE — Progress Notes (Signed)
    Chronic Care Management Pharmacy Assistant   Name: LISTON THUM  MRN: [id number] DOB: 1930-08-11   Reason for Encounter: Medication Coordination Call    Recent office visits:  06/01/20 [facility] [place], PCP 06/15/20 [facility] [place], PCP  Recent consult visits: None noted.   Hospital visits: No hospital visits.  Medications: Outpatient Encounter Medications as of 06/18/2020  Medication Sig  . acetaminophen (TYLENOL) 325 MG tablet Take 650 mg by mouth every 6 (six) hours as needed for moderate pain or headache.   [name] atorvastatin (LIPITOR) 40 MG tablet TAKE ONE TABLET BY MOUTH EVERY MORNING  . clopidogrel (PLAVIX) 75 MG tablet TAKE ONE TABLET BY MOUTH EVERY MORNING  . gabapentin (NEURONTIN) 100 MG capsule Take 1 capsule (100 mg total) by mouth at bedtime.  . Omega-3 Fatty Acids (FISH OIL) 500 MG CAPS Take 1 capsule by mouth daily.  . vitamin C (ASCORBIC ACID) 250 MG tablet Take 250 mg by mouth daily.   No facility-administered encounter medications on file as of 06/18/2020.   Reviewed chart for medication changes ahead of medication coordination call.  No medication changes indicated OR if recent visit, treatment plan here.  BP Readings from Last 3 Encounters:  06/15/20 (!) 144/72  06/01/20 136/70  05/04/20 (!) 165/90    Lab Results  Component Value Date   HGBA1C 6.1 12/24/2019     Patient obtains medications through Adherence Packaging  90 Days   Last adherence delivery included: (medication name and frequency) Vitamin C 500 mg Tab Take one tab by mouth every morning Fish Oil 500 mg Softgel Take one capsul by mouth every morning Clopidogrel 75 mg Take one tab by mouth every morning Atorvastatin 40 mg Take one tab by mouth every morning   Called patient and reviewed medications, next delivery is due May 2022.  Star Rating Drugs: Atorvastatin -04/29/20-90D  Orinda Kenner, RMA Clinical Pharmacists Assistant [phone]  Time Spent: 51 mins

## 2020-06-23 ENCOUNTER — Ambulatory Visit: Payer: Medicare Other | Admitting: Internal Medicine

## 2020-07-03 ENCOUNTER — Ambulatory Visit
Admission: RE | Admit: 2020-07-03 | Discharge: 2020-07-03 | Disposition: A | Discharge status: Home / Self Care | Payer: Medicare Other | Source: Ambulatory Visit | Attending: Internal Medicine | Admitting: Internal Medicine

## 2020-07-03 DIAGNOSIS — N281 Cyst of kidney, acquired: Secondary | ICD-10-CM

## 2020-07-03 DIAGNOSIS — N2889 Other specified disorders of kidney and ureter: Secondary | ICD-10-CM | POA: Diagnosis not present

## 2020-07-03 MED ORDER — GADOBENATE DIMEGLUMINE 529 MG/ML IV SOLN
17.0000 mL | Freq: Once | INTRAVENOUS | Status: AC | PRN
  Administered 2020-07-03: 17 mL via INTRAVENOUS

## 2020-07-07 ENCOUNTER — Telehealth: Payer: Self-pay | Admitting: Internal Medicine

## 2020-07-07 NOTE — Telephone Encounter (Signed)
The MRI of your kidney showed that the area of concern is just scarring. There are no concerning findings.  Written by Binnie Rail, MD on 07/05/2020 7:29 AM EDT

## 2020-07-07 NOTE — Telephone Encounter (Signed)
Patient had an MRI on 04.23.22 and is wondering the results

## 2020-07-07 NOTE — Telephone Encounter (Signed)
Results given to patient today. 

## 2020-07-16 ENCOUNTER — Telehealth: Payer: Self-pay | Admitting: Pharmacist

## 2020-07-16 NOTE — Progress Notes (Signed)
    Chronic Care Management Pharmacy Assistant   Name: Ronald Stafford  MRN: [id number] DOB: 16-Jul-1930  Reason for Encounter: Medication Coordination Call   Medications: Outpatient Encounter Medications as of 07/16/2020  Medication Sig  . acetaminophen (TYLENOL) 325 MG tablet Take 650 mg by mouth every 6 (six) hours as needed for moderate pain or headache.   [name] atorvastatin (LIPITOR) 40 MG tablet TAKE ONE TABLET BY MOUTH EVERY MORNING  . clopidogrel (PLAVIX) 75 MG tablet TAKE ONE TABLET BY MOUTH EVERY MORNING  . gabapentin (NEURONTIN) 100 MG capsule Take 1 capsule (100 mg total) by mouth at bedtime.  . Omega-3 Fatty Acids (FISH OIL) 500 MG CAPS Take 1 capsule by mouth daily.  . vitamin C (ASCORBIC ACID) 250 MG tablet Take 250 mg by mouth daily.   No facility-administered encounter medications on file as of 07/16/2020.   Reviewed chart for medication changes ahead of medication coordination call.  No OVs, Consults, or hospital visits since last care coordination call/Pharmacist visit. (If appropriate, list visit date, provider name)  No medication changes indicated OR if recent visit, treatment plan here.  BP Readings from Last 3 Encounters:  06/15/20 (!) 144/72  06/01/20 136/70  05/04/20 (!) 165/90    Lab Results  Component Value Date   HGBA1C 6.1 12/24/2019     Patient obtains medications through Adherence Packaging  90 Days   Last adherence delivery included: (medication name and frequency) Vitamin C 500 mg Tab Take one tab by mouth every morning Fish Oil 500 mg Softgel Take one capsul by mouth every morning Clopidogrel 75 mg Take one tab by mouth every morning Atorvastatin 40 mg Take one tab by mouth every morning  Patient is due for next adherence delivery on: 07/28/20. Called patient and reviewed medications and coordinated delivery.  This delivery to include: Vitamin C 500 mg Tab Take one tab by mouth every morning Fish Oil 500 mg Softgel Take one capsul by mouth every  morning Clopidogrel 75 mg Take one tab by mouth every morning Atorvastatin 40 mg Take one tab by mouth every morning  Confirmed delivery date of 07/28/20, advised patient that pharmacy will contact them the morning of delivery.  Star Rating Drugs: Atorvastatin - last fill 04/29/20 [place], RMA Clinical Pharmacists Assistant [phone]  Time Spent: 62

## 2020-08-10 ENCOUNTER — Telehealth: Payer: Self-pay | Admitting: Internal Medicine

## 2020-08-10 NOTE — Telephone Encounter (Signed)
LVM for pt to rtn my call to schedule AWV with [facility]. Please schedule AWV if pt calls the office  

## 2020-08-16 ENCOUNTER — Ambulatory Visit: Payer: Medicare Other

## 2020-08-17 ENCOUNTER — Other Ambulatory Visit: Payer: Self-pay

## 2020-08-17 ENCOUNTER — Ambulatory Visit (INDEPENDENT_AMBULATORY_CARE_PROVIDER_SITE_OTHER): Payer: Medicare Other

## 2020-08-17 VITALS — BP 140/80 | HR 94 | Temp 98.3°F | Ht 74.0 in | Wt 187.0 lb

## 2020-08-17 DIAGNOSIS — Z0000 Encounter for general adult medical examination without abnormal findings: Secondary | ICD-10-CM | POA: Diagnosis not present

## 2020-08-17 NOTE — Patient Instructions (Signed)
Ronald Stafford , Thank you for taking time to come for your Medicare Wellness Visit. I appreciate your ongoing commitment to your health goals. Please review the following plan we discussed and let me know if I can assist you in the future.   Screening recommendations/referrals: Colonoscopy: last done 10/21/2014; no repeat due to age Recommended yearly ophthalmology/optometry visit for glaucoma screening and checkup Recommended yearly dental visit for hygiene and checkup  Vaccinations: Influenza vaccine: 12/24/2019 Pneumococcal vaccine: 02/28/2013, 07/14/2014 Tdap vaccine: 04/04/2016; due every 10 years Shingles vaccine: completed per patient at [place]: 04/03/2019, 04/24/2019, 12/26/2019, 07/23/2020  Advanced directives: Please bring a copy of your health care power of attorney and living will to the office at your convenience.  Conditions/risks identified: Yes; Reviewed health maintenance screenings with patient today and relevant education, vaccines, and/or referrals were provided. Please continue to do your personal lifestyle choices by: daily care of teeth and gums, regular physical activity (goal should be 5 days a week for 30 minutes), eat a healthy diet, avoid tobacco and drug use, limiting any alcohol intake, taking a low-dose aspirin (if not allergic or have been advised by your provider otherwise) and taking vitamins and minerals as recommended by your provider. Continue doing brain stimulating activities (puzzles, reading, adult coloring books, staying active) to keep memory sharp. Continue to eat heart healthy diet (full of fruits, vegetables, whole grains, lean protein, water--limit salt, fat, and sugar intake) and increase physical activity as tolerated.  Next appointment: Please schedule your next Medicare Wellness Visit with your Nurse Health Advisor in 1 year by calling [phone].  Preventive Care 84 Years and Older, Male Preventive care refers to lifestyle choices and visits  with your health care provider that can promote health and wellness. What does preventive care include?  A yearly physical exam. This is also called an annual well check.  Dental exams once or twice a year.  Routine eye exams. Ask your health care provider how often you should have your eyes checked.  Personal lifestyle choices, including:  Daily care of your teeth and gums.  Regular physical activity.  Eating a healthy diet.  Avoiding tobacco and drug use.  Limiting alcohol use.  Practicing safe sex.  Taking low doses of aspirin every day.  Taking vitamin and mineral supplements as recommended by your health care provider. What happens during an annual well check? The services and screenings done by your health care provider during your annual well check will depend on your age, overall health, lifestyle risk factors, and family history of disease. Counseling  Your health care provider may ask you questions about your:  Alcohol use.  Tobacco use.  Drug use.  Emotional well-being.  Home and relationship well-being.  Sexual activity.  Eating habits.  History of falls.  Memory and ability to understand (cognition).  Work and work Statistician. Screening  You may have the following tests or measurements:  Height, weight, and BMI.  Blood pressure.  Lipid and cholesterol levels. These may be checked every 5 years, or more frequently if you are over 95 years old.  Skin check.  Lung cancer screening. You may have this screening every year starting at age 19 if you have a 30-pack-year history of smoking and currently smoke or have quit within the past 15 years.  Fecal occult blood test (FOBT) of the stool. You may have this test every year starting at age 56.  Flexible sigmoidoscopy or colonoscopy. You may have a sigmoidoscopy every 5 years or a  colonoscopy every 10 years starting at age 73.  Prostate cancer screening. Recommendations will vary depending on  your family history and other risks.  Hepatitis C blood test.  Hepatitis B blood test.  Sexually transmitted disease (STD) testing.  Diabetes screening. This is done by checking your blood sugar (glucose) after you have not eaten for a while (fasting). You may have this done every 1-3 years.  Abdominal aortic aneurysm (AAA) screening. You may need this if you are a current or former smoker.  Osteoporosis. You may be screened starting at age 67 if you are at high risk. Talk with your health care provider about your test results, treatment options, and if necessary, the need for more tests. Vaccines  Your health care provider may recommend certain vaccines, such as:  Influenza vaccine. This is recommended every year.  Tetanus, diphtheria, and acellular pertussis (Tdap, Td) vaccine. You may need a Td booster every 10 years.  Zoster vaccine. You may need this after age 66.  Pneumococcal 13-valent conjugate (PCV13) vaccine. One dose is recommended after age 45.  Pneumococcal polysaccharide (PPSV23) vaccine. One dose is recommended after age 68. Talk to your health care provider about which screenings and vaccines you need and how often you need them. This information is not intended to replace advice given to you by your health care provider. Make sure you discuss any questions you have with your health care provider. Document Released: 03/26/2015 Document Revised: 11/17/2015 Document Reviewed: 12/29/2014 Elsevier Interactive Patient Education  2017 [employer] Prevention in the Home Falls can cause injuries. They can happen to people of all ages. There are many things you can do to make your home safe and to help prevent falls. What can I do on the outside of my home?  Regularly fix the edges of walkways and driveways and fix any cracks.  Remove anything that might make you trip as you walk through a door, such as a raised step or threshold.  Trim any bushes or trees on  the path to your home.  Use bright outdoor lighting.  Clear any walking paths of anything that might make someone trip, such as rocks or tools.  Regularly check to see if handrails are loose or broken. Make sure that both sides of any steps have handrails.  Any raised decks and porches should have guardrails on the edges.  Have any leaves, snow, or ice cleared regularly.  Use sand or salt on walking paths during winter.  Clean up any spills in your garage right away. This includes oil or grease spills. What can I do in the bathroom?  Use night lights.  Install grab bars by the toilet and in the tub and shower. Do not use towel bars as grab bars.  Use non-skid mats or decals in the tub or shower.  If you need to sit down in the shower, use a plastic, non-slip stool.  Keep the floor dry. Clean up any water that spills on the floor as soon as it happens.  Remove soap buildup in the tub or shower regularly.  Attach bath mats securely with double-sided non-slip rug tape.  Do not have throw rugs and other things on the floor that can make you trip. What can I do in the bedroom?  Use night lights.  Make sure that you have a light by your bed that is easy to reach.  Do not use any sheets or blankets that are too big for your bed. They  should not hang down onto the floor.  Have a firm chair that has side arms. You can use this for support while you get dressed.  Do not have throw rugs and other things on the floor that can make you trip. What can I do in the kitchen?  Clean up any spills right away.  Avoid walking on wet floors.  Keep items that you use a lot in easy-to-reach places.  If you need to reach something above you, use a strong step stool that has a grab bar.  Keep electrical cords out of the way.  Do not use floor polish or wax that makes floors slippery. If you must use wax, use non-skid floor wax.  Do not have throw rugs and other things on the floor that  can make you trip. What can I do with my stairs?  Do not leave any items on the stairs.  Make sure that there are handrails on both sides of the stairs and use them. Fix handrails that are broken or loose. Make sure that handrails are as long as the stairways.  Check any carpeting to make sure that it is firmly attached to the stairs. Fix any carpet that is loose or worn.  Avoid having throw rugs at the top or bottom of the stairs. If you do have throw rugs, attach them to the floor with carpet tape.  Make sure that you have a light switch at the top of the stairs and the bottom of the stairs. If you do not have them, ask someone to add them for you. What else can I do to help prevent falls?  Wear shoes that:  Do not have high heels.  Have rubber bottoms.  Are comfortable and fit you well.  Are closed at the toe. Do not wear sandals.  If you use a stepladder:  Make sure that it is fully opened. Do not climb a closed stepladder.  Make sure that both sides of the stepladder are locked into place.  Ask someone to hold it for you, if possible.  Clearly mark and make sure that you can see:  Any grab bars or handrails.  First and last steps.  Where the edge of each step is.  Use tools that help you move around (mobility aids) if they are needed. These include:  Canes.  Walkers.  Scooters.  Crutches.  Turn on the lights when you go into a dark area. Replace any light bulbs as soon as they burn out.  Set up your furniture so you have a clear path. Avoid moving your furniture around.  If any of your floors are uneven, fix them.  If there are any pets around you, be aware of where they are.  Review your medicines with your doctor. Some medicines can make you feel dizzy. This can increase your chance of falling. Ask your doctor what other things that you can do to help prevent falls. This information is not intended to replace advice given to you by your health care  provider. Make sure you discuss any questions you have with your health care provider. Document Released: 12/24/2008 Document Revised: 08/05/2015 Document Reviewed: 04/03/2014 Elsevier Interactive Patient Education  2017 [employer].

## 2020-08-17 NOTE — Progress Notes (Signed)
Subjective:   Ronald Stafford is a 87 y.o. male who presents for Medicare Annual/Subsequent preventive examination.  Review of Systems    No ROS. Medicare Wellness Visit. Additional risk factors are reflected in social history. Cardiac Risk Factors include: advanced age (>62men, >3 women);dyslipidemia;male gender Sleep Patterns: No sleep issues, feels rested on waking and sleeps 8+ hours nightly. Home Safety/Smoke Alarms: Feels safe in home; uses home alarm. Smoke alarms in place. Living environment: 2-story home; Lives alone; no needs for DME; good support system. Seat Belt Safety/Bike Helmet: Wears seat belt.    Objective:    Today's Vitals   08/17/20 1111  BP: 140/80  Pulse: 94  Temp: 98.3 F (36.8 C)  SpO2: 97%  Weight: 187 lb (84.8 kg)  Height: 6\' 2"  (1.88 m)  PainSc: 0-No pain   Body mass index is 24.01 kg/m.  Advanced Directives 08/17/2020 03/25/2019 04/18/2018 02/22/2017 02/22/2017 02/14/2017 02/14/2017  Does Patient Have a Medical Advance Directive? Yes No Yes Yes Yes Yes Yes  Type of Advance Directive Living will - [place];Living will [place];Living will [place];Living will [place];Living will [place];Living will  Does patient want to make changes to medical advance directive? No - Patient declined - - No - Patient declined - No - Patient declined No - Patient declined  Copy of Healthcare Power of Attorney in Chart? - - No - copy requested No - copy requested No - copy requested Yes Yes  Would patient like information on creating a medical advance directive? - No - Patient declined - - - - -    Current Medications (verified) Outpatient Encounter Medications as of 08/17/2020  Medication Sig  . atorvastatin (LIPITOR) 40 MG tablet TAKE ONE TABLET BY MOUTH EVERY MORNING  . clopidogrel (PLAVIX) 75 MG tablet TAKE ONE TABLET BY MOUTH EVERY MORNING  . Omega-3 Fatty Acids  (FISH OIL) 500 MG CAPS Take 1 capsule by mouth daily.  . vitamin C (ASCORBIC ACID) 250 MG tablet Take 250 mg by mouth daily.  [name] acetaminophen (TYLENOL) 325 MG tablet Take 650 mg by mouth every 6 (six) hours as needed for moderate pain or headache.  (Patient not taking: Reported on 08/17/2020)  . gabapentin (NEURONTIN) 100 MG capsule Take 1 capsule (100 mg total) by mouth at bedtime. (Patient not taking: Reported on 08/17/2020)   No facility-administered encounter medications on file as of 08/17/2020.    Allergies (verified) Chicken allergy   History: Past Medical History:  Diagnosis Date  . Cancer [facility]) 2004   prostate cancer, Dr. Risa Grill  . Colon polyps   . Deep venous thrombosis ([place]) 1997   Postop  . Depression   . GERD (gastroesophageal reflux disease)   . Hyperlipidemia   . Hypertension   . Radial head fracture, closed 09/18/2013  . Shoulder fracture, left 02/23/2011   immobilized in sling , Dr Norris(no surgery)   Past Surgical History:  Procedure Laterality Date  . COLONOSCOPY  2006   Negative,Dr.Edwards  . colonoscopy with polypectomy      PMH of  . ESOPHAGOGASTRODUODENOSCOPY N/A 02/23/2017   Procedure: ESOPHAGOGASTRODUODENOSCOPY (EGD);  Surgeon: Otis Brace, MD;  Location: [facility];  Service: Gastroenterology;  Laterality: N/A;  . JOINT REPLACEMENT  1997, 2001   Total Hip replacement X 2  . LUMBAR SPINE SURGERY  09/28/2010   Dr Rolena Infante; for spinal stenosis  . MECKEL DIVERTICULUM EXCISION     Age 47  .  PROSTATECTOMY  2004   w/ radiation, Dr Risa Grill  . TOTAL HIP REVISION Left 02/14/2017   Procedure: Left hip polyethylene EXCHANGE;  Surgeon: Gaynelle Arabian, MD;  Location: [facility];  Service: Orthopedics;  Laterality: Left;  [name] VASECTOMY     Family History  Problem Relation Age of Onset  . Prostate cancer Father   . Testicular cancer Son    Social History   Socioeconomic History  . Marital status: Widowed    Spouse name: Not on file  . Number of children: 2   . Years of education: Not on file  . Highest education level: Not on file  Occupational History  . Occupation: retired  Tobacco Use  . Smoking status: Former Smoker    Packs/day: 0.50    Years: 5.00    Pack years: 2.50    Types: Cigarettes    Quit date: 03/13/1988    Years since quitting: 32.4  . Smokeless tobacco: Former Systems developer  . Tobacco comment: Quit at about age 22 / chewed tobacca x 1 year  Vaping Use  . Vaping Use: Never used  Substance and Sexual Activity  . Alcohol use: No    Alcohol/week: 0.0 standard drinks  . Drug use: No  . Sexual activity: Never  Other Topics Concern  . Not on file  Social History Narrative   Lives alone; Wife passed December 09, 2012;   Died of ovarian cancer; breast cancer; other cancer;      Social Determinants of Health   Financial Resource Strain: Low Risk   . Difficulty of Paying Living Expenses: Not hard at all  Food Insecurity: No Food Insecurity  . Worried About Charity fundraiser in the Last Year: Never true  . Ran Out of Food in the Last Year: Never true  Transportation Needs: No Transportation Needs  . Lack of Transportation (Medical): No  . Lack of Transportation (Non-Medical): No  Physical Activity: Sufficiently Active  . Days of Exercise per Week: 5 days  . Minutes of Exercise per Session: 30 min  Stress: No Stress Concern Present  . Feeling of Stress : Not at all  Social Connections: Moderately Integrated  . Frequency of Communication with Friends and Family: More than three times a week  . Frequency of Social Gatherings with Friends and Family: More than three times a week  . Attends Religious Services: More than 4 times per year  . Active Member of Clubs or Organizations: Yes  . Attends Archivist Meetings: More than 4 times per year  . Marital Status: Widowed    Tobacco Counseling Counseling given: Not Answered Comment: Quit at about age 71 / chewed tobacca x 1 year   Clinical Intake:  Pre-visit  preparation completed: Yes  Pain : No/denies pain Pain Score: 0-No pain     BMI - recorded: 24.01 Nutritional Status: BMI of 19-24  Normal Nutritional Risks: None Diabetes: No  How often do you need to have someone help you when you read instructions, pamphlets, or other written materials from your doctor or pharmacy?: 1 - Never What is the last grade level you completed in school?: 11th grade  Diabetic? no  Interpreter Needed?: No  Information entered by :: Lisette Abu, LPN   Activities of Daily Living In your present state of health, do you have any difficulty performing the following activities: 08/17/2020 06/01/2020  Hearing? N N  Vision? N N  Difficulty concentrating or making decisions? N N  Walking or climbing stairs? N  N  Dressing or bathing? N N  Doing errands, shopping? N N  Preparing Food and eating ? N -  Using the Toilet? N -  In the past six months, have you accidently leaked urine? N -  Do you have problems with loss of bowel control? N -  Managing your Medications? N -  Managing your Finances? N -  Some recent data might be hidden    Patient Care Team: Binnie Rail, MD as PCP - General (Internal Medicine) Rana Snare, MD (Inactive) as Consulting Physician (Urology) Marybelle Killings, MD as Consulting Physician (Orthopedic Surgery) Charlton Haws, [facility] as Pharmacist (Pharmacist) Early, Arvilla Meres, MD as Consulting Physician (Vascular Surgery)  Indicate any recent Medical Services you may have received from other than [facility] providers in the past year (date may be approximate).     Assessment:   This is a routine wellness examination for [place].  Hearing/Vision screen No exam data present  Dietary issues and exercise activities discussed: Current Exercise Habits: Home exercise routine, Type of exercise: walking;Other - see comments (Yard work, gardening), Time (Minutes): 30, Frequency (Times/Week): 5, Weekly Exercise (Minutes/Week): 150,  Intensity: Mild, Exercise limited by: orthopedic condition(s);cardiac condition(s)  Goals Addressed   None    Depression Screen PHQ 2/9 Scores 08/17/2020 06/18/2019 04/18/2018 08/02/2016 07/19/2015 07/14/2014 03/17/2014  PHQ - 2 Score 0 0 0 0 0 0 0    Fall Risk Fall Risk  08/17/2020 06/18/2019 04/18/2018 12/14/2017 08/02/2016  Falls in the past year? 1 1 1  Yes No  Number falls in past yr: 0 1 0 2 or more -  Injury with Fall? 1 0 0 Yes -  Risk Factor Category  - - - High Fall Risk -  Risk for fall due to : - Impaired balance/gait;Orthopedic patient - - -  Risk for fall due to: Comment - - - - -  Follow up Falls evaluation completed Falls evaluation completed;Education provided;Falls prevention discussed Falls prevention discussed;Education provided - -    FALL RISK PREVENTION PERTAINING TO THE HOME:  Any stairs in or around the home? Yes  If so, are there any without handrails? No  Home free of loose throw rugs in walkways, pet beds, electrical cords, etc? Yes  Adequate lighting in your home to reduce risk of falls? Yes   ASSISTIVE DEVICES UTILIZED TO PREVENT FALLS:  Life alert? No  Use of a cane, walker or w/c? No  Grab bars in the bathroom? Yes  Shower chair or bench in shower? No  Elevated toilet seat or a handicapped toilet? No   TIMED UP AND GO:  Was the test performed? No .  Length of time to ambulate 10 feet: 0 sec.   Gait steady and fast without use of assistive device  Cognitive Function: Normal cognitive status assessed by direct observation by this Nurse Health Advisor. No abnormalities found.   MMSE - Mini Mental State Exam 07/19/2015 07/14/2014  Not completed: (No Data) Unable to complete     6CIT Screen 06/18/2019  What Year? 0 points  What month? 0 points  What time? 0 points  Count back from 20 2 points  Months in reverse 2 points  Repeat phrase 0 points  Total Score 4    Immunizations Immunization History  Administered Date(s) Administered  . Fluad Quad(high Dose  65+) 10/31/2018, 12/24/2019  . Influenza Split 12/20/2010, 12/13/2011  . Influenza Whole 12/06/2007, 01/05/2009, 12/06/2009  . Influenza, High Dose Seasonal PF 12/18/2013, 12/07/2014, 12/17/2015, 12/14/2017  .  Influenza,inj,Quad PF,6+ Mos 01/01/2013  . Influenza,inj,quad, With Preservative 12/11/2016  . Influenza-Unspecified 11/27/2016  . PFIZER(Purple Top)SARS-COV-2 Vaccination 04/03/2019, 04/24/2019, 12/26/2019, 07/23/2020  . Pneumococcal Conjugate-13 07/14/2014  . Pneumococcal Polysaccharide-23 02/28/2013  . Tdap 06/06/2005, 04/04/2016  . Zoster, Live 03/01/2011    TDAP status: Up to date  Flu Vaccine status: Up to date  Pneumococcal vaccine status: Up to date  Covid-19 vaccine status: Completed vaccines  Qualifies for Shingles Vaccine? Yes   Zostavax completed Yes   Shingrix Completed?: No.    Education has been provided regarding the importance of this vaccine. Patient has been advised to call insurance company to determine out of pocket expense if they have not yet received this vaccine. Advised may also receive vaccine at local pharmacy or Health Dept. Verbalized acceptance and understanding.  Screening Tests Health Maintenance  Topic Date Due  . Zoster Vaccines- Shingrix (1 of 2) Never done  . INFLUENZA VACCINE  10/11/2020  . TETANUS/TDAP  04/04/2026  . COVID-19 Vaccine  Completed  . PNA vac Low Risk Adult  Completed  . Pneumococcal Vaccine 36-59 Years old  Aged Out  . HPV VACCINES  Aged Out    Health Maintenance  Health Maintenance Due  Topic Date Due  . Zoster Vaccines- Shingrix (1 of 2) Never done    Colorectal cancer screening: No longer required.   Lung Cancer Screening: (Low Dose CT Chest recommended if Age 55-80 years, 30 pack-year currently smoking OR have quit w/in 15years.) does not qualify.   Lung Cancer Screening Referral: no  Additional Screening:  Hepatitis C Screening: does not qualify; Completed no  Vision Screening: Recommended annual  ophthalmology exams for early detection of glaucoma and other disorders of the eye. Is the patient up to date with their annual eye exam?  Yes  Who is the provider or what is the name of the office in which the patient attends annual eye exams? Dr.Charles Thom Chimes If pt is not established with a provider, would they like to be referred to a provider to establish care? No .   Dental Screening: Recommended annual dental exams for proper oral hygiene  Community Resource Referral / Chronic Care Management: CRR required this visit?  No   CCM required this visit?  No      Plan:     I have personally reviewed and noted the following in the patient's chart:   . Medical and social history . Use of alcohol, tobacco or illicit drugs  . Current medications and supplements including opioid prescriptions. Patient is not currently taking opioid prescriptions. . Functional ability and status . Nutritional status . Physical activity . Advanced directives . List of other physicians . Hospitalizations, surgeries, and ER visits in previous 12 months . Vitals . Screenings to include cognitive, depression, and falls . Referrals and appointments  In addition, I have reviewed and discussed with patient certain preventive protocols, quality metrics, and best practice recommendations. A written personalized care plan for preventive services as well as general preventive health recommendations were provided to patient.     Sheral Flow, LPN   11/20/3714   Nurse Notes: n/a

## 2020-09-08 IMAGING — DX DG CHEST 2V
2 series · 3 of 3 positions shown · non-contrast
Comparison: 02/22/2017

CLINICAL DATA: Shortness of breath.  Substernal chest pain.

EXAM:
CHEST - 2 VIEW

[Series 1: chest pa · 0.14mm/px · 2 of 2 slices shown]
[im 1/2]
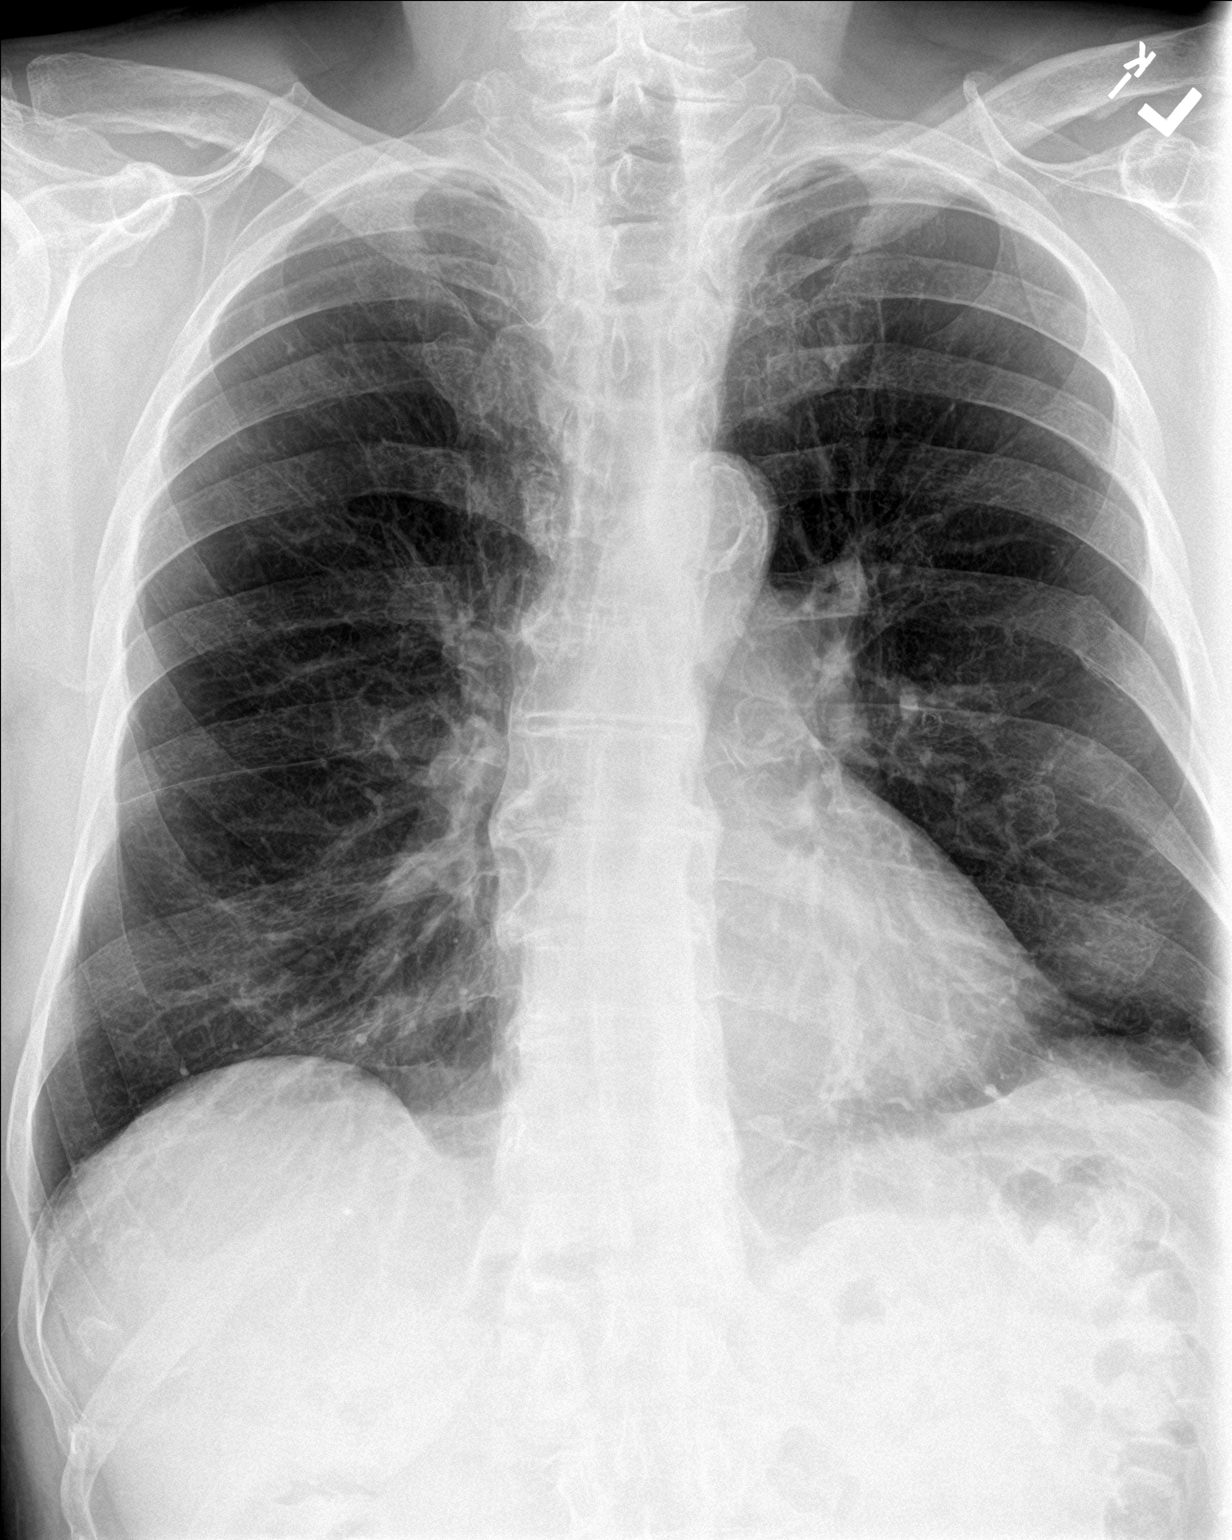
[im 2/2]
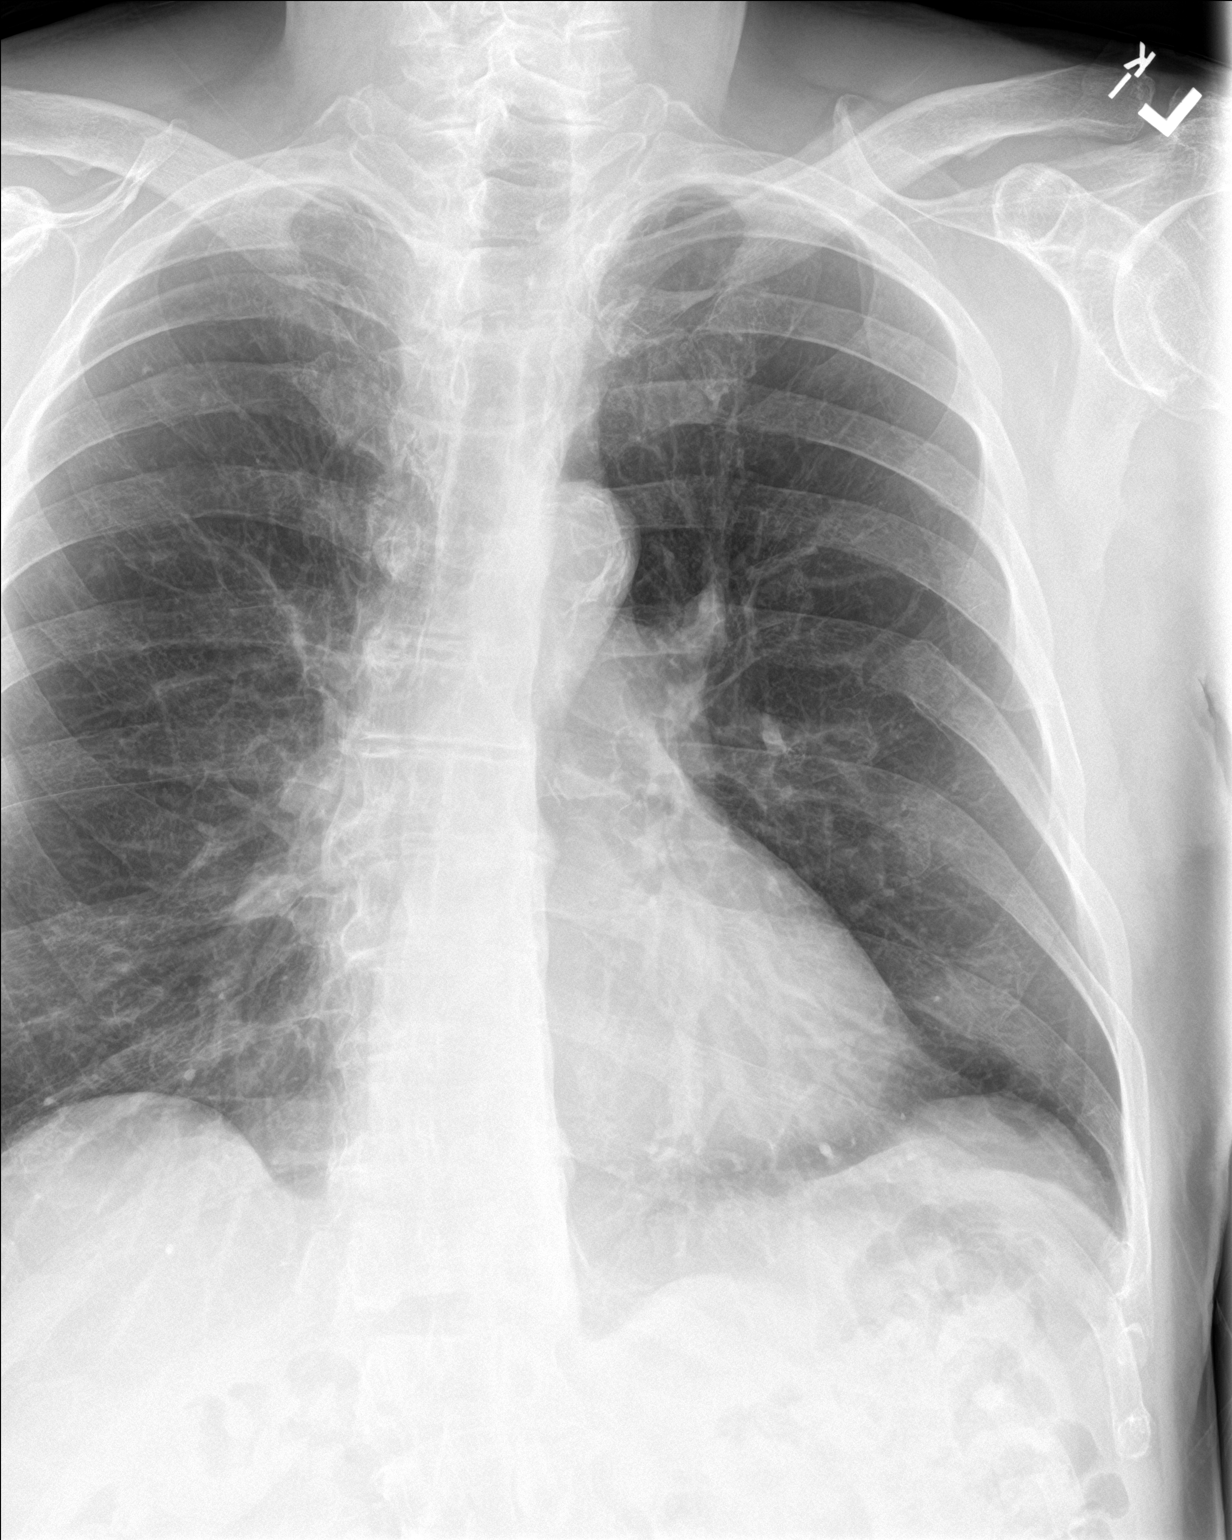

[chest lat]
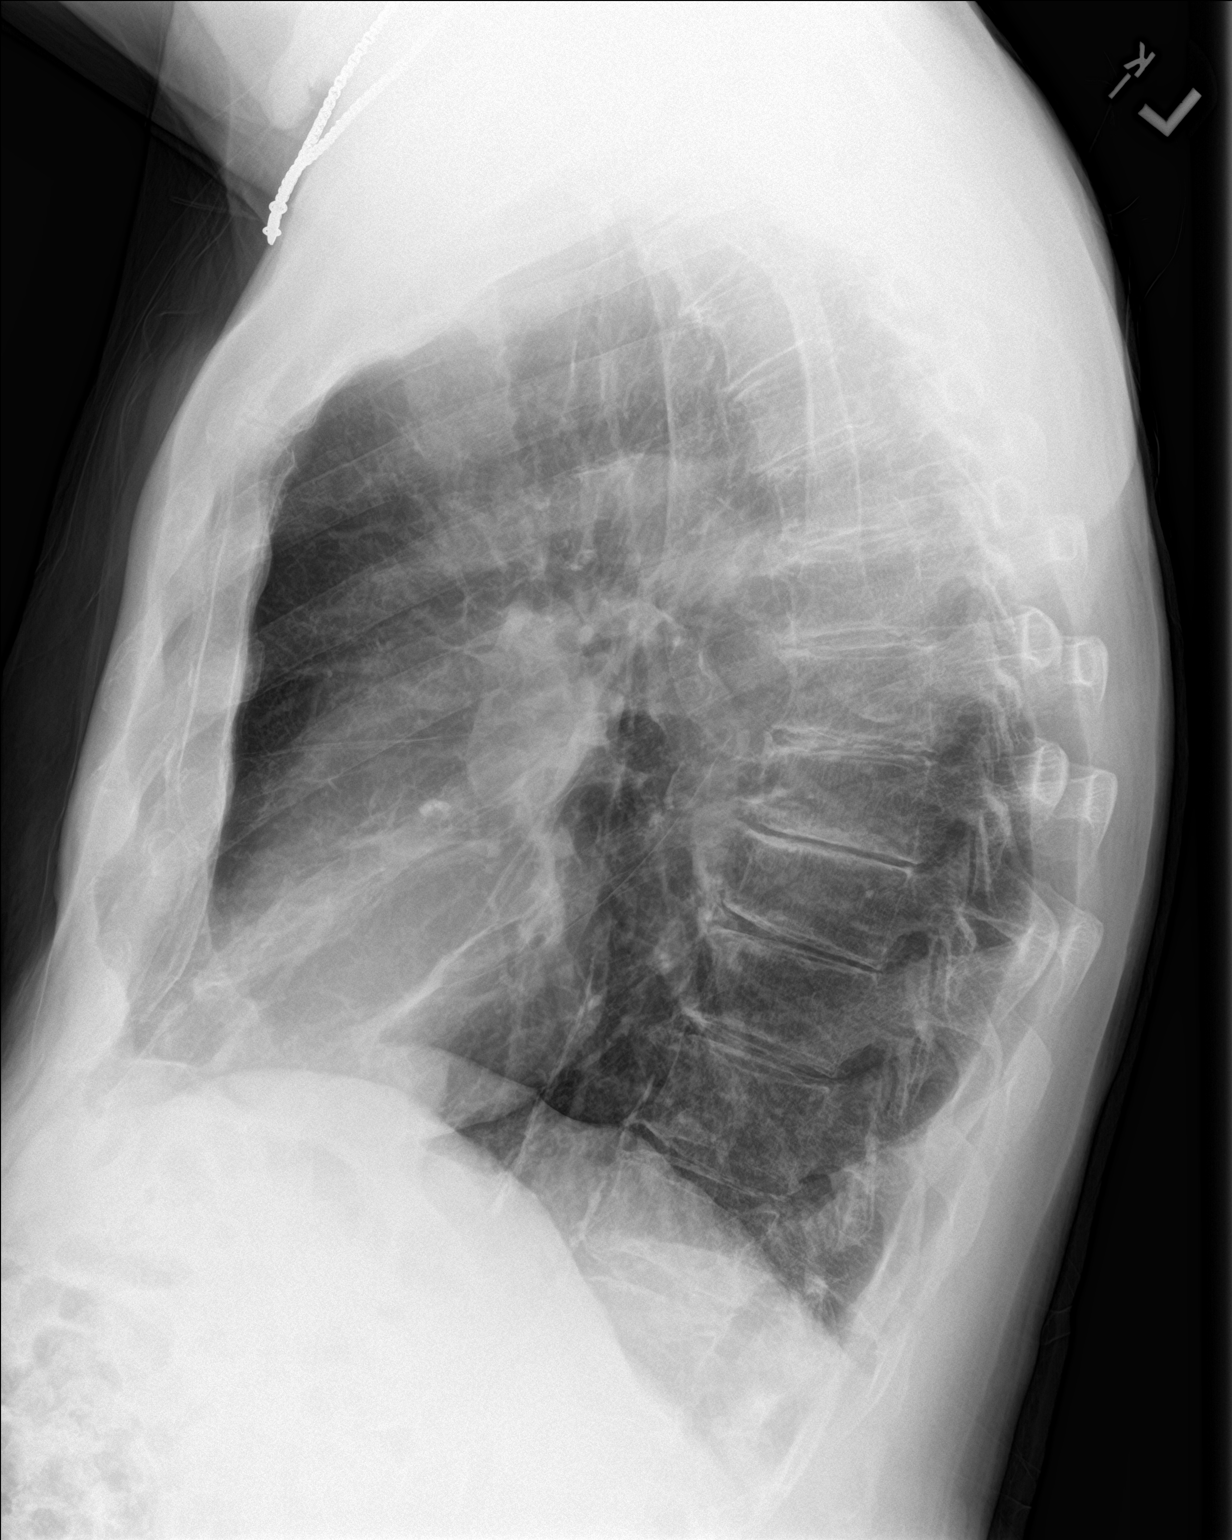

[3 of 3 positions shown; findings below may reference images not displayed]

FINDINGS: Aortic atherosclerosis. Heart is normal size. Lungs clear. No
effusions or acute bony abnormality.
IMPRESSION: No active cardiopulmonary disease.

## 2020-10-10 ENCOUNTER — Other Ambulatory Visit: Payer: Self-pay | Admitting: Internal Medicine

## 2020-10-14 ENCOUNTER — Telehealth: Payer: Self-pay | Admitting: Pharmacist

## 2020-10-14 NOTE — Progress Notes (Addendum)
    Chronic Care Management Pharmacy Assistant   Name: FITZ ROBBERSON  MRN: [id number] DOB: 14-Jan-1931   Reason for Encounter: Medication Coordination Call   Conditions to be addressed/monitored:  Hospital visits:  None in previous 6 months  Medications: Outpatient Encounter Medications as of 10/14/2020  Medication Sig   acetaminophen (TYLENOL) 325 MG tablet Take 650 mg by mouth every 6 (six) hours as needed for moderate pain or headache.  (Patient not taking: Reported on 08/17/2020)   atorvastatin (LIPITOR) 40 MG tablet TAKE ONE TABLET BY MOUTH EVERY MORNING   clopidogrel (PLAVIX) 75 MG tablet TAKE ONE TABLET BY MOUTH EVERY MORNING   gabapentin (NEURONTIN) 100 MG capsule Take 1 capsule (100 mg total) by mouth at bedtime. (Patient not taking: Reported on 08/17/2020)   Omega-3 Fatty Acids (FISH OIL) 500 MG CAPS Take 1 capsule by mouth daily.   vitamin C (ASCORBIC ACID) 250 MG tablet Take 250 mg by mouth daily.   No facility-administered encounter medications on file as of 10/14/2020.   Pharmacist Review Reviewed chart for medication changes ahead of medication coordination call.  No OVs, Consults, or hospital visits since last care coordination call/Pharmacist visit.  No medication changes indicated   BP Readings from Last 3 Encounters:  08/17/20 140/80  06/15/20 (!) 144/72  06/01/20 136/70    Lab Results  Component Value Date   HGBA1C 6.1 12/24/2019     Patient obtains medications through Adherence Packaging  90 Days   Last adherence delivery included: 07/22/20 Vitamin C 500 mg Tab Take one tab by mouth every morning Fish Oil 500 mg Softgel Take one capsul by mouth every morning Clopidogrel 75 mg Take one tab by mouth every morning Atorvastatin 40 mg Take one tab by mouth every morning  Patient is due for next adherence delivery on: 10/26/20.  Called patient and reviewed medications and coordinated delivery. This delivery to include: Vitamin C 500 mg Tab Take one tab by mouth  every morning Fish Oil 500 mg Softgel Take one capsul by mouth every morning Clopidogrel 75 mg Take one tab by mouth every morning Atorvastatin 40 mg Take one tab by mouth every morning  Patient needs refills for vitamin C and fish oil-CPP to request  Confirmed delivery date of 10/26/20, advised patient that pharmacy will contact them the morning of delivery.   Star Rating Drugs: Atorvastatin 40 mg-last fill 10/11/20 90 ds  Ethelene Hal Clinical Pharmacist Assistant [phone]   Time spent:20

## 2020-11-18 ENCOUNTER — Telehealth: Payer: Self-pay | Admitting: Pharmacist

## 2020-11-18 NOTE — Progress Notes (Signed)
    Chronic Care Management Pharmacy Assistant   Name: Ronald Stafford  MRN: [id number] DOB: [birth year]   Reason for Encounter: Disease State   Conditions to be addressed/monitored: General  Recent office visits:  None ID  Recent consult visits:  None ID  Hospital visits:  None in previous 6 months  Medications: Outpatient Encounter Medications as of 11/18/2020  Medication Sig   acetaminophen (TYLENOL) 325 MG tablet Take 650 mg by mouth every 6 (six) hours as needed for moderate pain or headache.  (Patient not taking: Reported on 08/17/2020)   atorvastatin (LIPITOR) 40 MG tablet TAKE ONE TABLET BY MOUTH EVERY MORNING   clopidogrel (PLAVIX) 75 MG tablet TAKE ONE TABLET BY MOUTH EVERY MORNING   gabapentin (NEURONTIN) 100 MG capsule Take 1 capsule (100 mg total) by mouth at bedtime. (Patient not taking: Reported on 08/17/2020)   Omega-3 Fatty Acids (FISH OIL) 500 MG CAPS Take 1 capsule by mouth daily.   vitamin C (ASCORBIC ACID) 250 MG tablet Take 250 mg by mouth daily.   No facility-administered encounter medications on file as of 11/18/2020.    Contacted [name] on 11/18/20, 12/07/20 and 12/08/20 for general disease state and medication adherence call.   Patient is not > 5 days past due for refill on the following medications per chart history:  Star Medications: Medication Name/mg Last Fill Days Supply Atorvastatin 40 mg  10/20/20  90   What concerns do you have about your medications? Patient states that he does not have any concerns about meds  The patient denies side effects with his medications.   How often do you forget or accidentally miss a dose?  Patient states that sometimes he does miss a dose or two  Do you use a pillbox? Yes  Are you having any problems getting your medications from your pharmacy? No  Has the cost of your medications been a concern? No If yes, what medication and is patient assistance available or has it been applied for?  Since last visit with  CPP, no interventions have been made:   The patient has not had an ED visit since last contact.   The patient denies problems with their health.   he denies  concerns or questions for [employer], Pharm. D at this time.    Care Gaps: Annual wellness visit in last year? Yes Most Recent BP reading:   PCP appointment on 12/10/20    [place] Pharmacist Assistant [phone]   Time spent:27

## 2020-12-09 NOTE — Patient Instructions (Addendum)
   Get flu and covid vaccine at your pharmacy.  Get the shingles vaccine at a later date.      Have blood work done today.    Medications changes include :   none     Please followup in 6 months

## 2020-12-09 NOTE — Progress Notes (Signed)
Subjective:    Patient ID: Ronald Stafford, male    DOB: 14-Jan-1931, 87 y.o.   MRN: [id number]  This visit occurred during the SARS-CoV-2 public health emergency.  Safety protocols were in place, including screening questions prior to the visit, additional usage of staff PPE, and extensive cleaning of exam room while observing appropriate contact time as indicated for disinfecting solutions.    HPI The patient is here for an acute visit and also here for follow up.   [place] on porch last thursday - tailbone is hurting  - he tripped over a board and landed on behind.  He was having tailbone pain.  It is better.  He denies hitting his head or other injuries.   He was post to follow-up next week for his routine medical problems and would like to do that now.  He is taking all of his medications as prescribed.  He has no concerns.   Medications and allergies reviewed with patient and updated if appropriate.  Patient Active Problem List   Diagnosis Date Noted   Coccyx pain 12/10/2020   Acquired cyst of kidney 06/15/2020   Gross hematuria 06/15/2020   Cervical radiculopathy 06/01/2020   Memory difficulty 06/01/2020   Acute pain of right shoulder 07/10/2019   Lightheadedness 01/03/2019   Urinary incontinence 10/31/2018   Left-sided chest wall pain 06/04/2018   Numbness and tingling of right hand 08/10/2017   Skin abnormalities 06/13/2017   History of total hip arthroplasty 03/28/2017   Symptomatic anemia 02/22/2017   Failed total hip arthroplasty ([place]) 02/14/2017   Osteoarthritis of left hip 12/10/2016   Acute right ankle pain 11/07/2016   Acute pain of left shoulder 10/04/2016   Chest pain 07/30/2016   History of DVT (deep vein thrombosis) 07/30/2016   History of TIA (transient ischemic attack) 07/30/2016   Thrombocytopenia ([place]) 07/30/2016   Carotid arterial disease ([place]) 07/20/2016   Right sided weakness 06/27/2016   Slurred speech 06/27/2016   Left wrist pain 08/11/2015    Frequent headaches 06/22/2015   Prediabetes 02/11/2015   Peroneal tendonitis of right lower extremity 01/12/2014   Mass of right ankle 12/31/2013   Ankle gives way, right 12/31/2013   Solitary pulmonary nodule 08/08/2012   SPINAL STENOSIS, LUMBAR 04/06/2010   LOW BACK PAIN SYNDROME 11/02/2009   FOOT DROP, RIGHT 05/21/2008   Hyperlipidemia 12/25/2006   PROSTATE CANCER, HX OF 12/25/2006   POLYP, COLON 05/02/2006    Current Outpatient Medications on File Prior to Visit  Medication Sig Dispense Refill   acetaminophen (TYLENOL) 325 MG tablet Take 650 mg by mouth every 6 (six) hours as needed for moderate pain or headache.     atorvastatin (LIPITOR) 40 MG tablet TAKE ONE TABLET BY MOUTH EVERY MORNING 90 tablet 1   clopidogrel (PLAVIX) 75 MG tablet TAKE ONE TABLET BY MOUTH EVERY MORNING 90 tablet 5   gabapentin (NEURONTIN) 100 MG capsule Take 1 capsule (100 mg total) by mouth at bedtime. 90 capsule 3   Omega-3 Fatty Acids (FISH OIL) 500 MG CAPS Take 1 capsule by mouth daily.     vitamin C (ASCORBIC ACID) 250 MG tablet Take 250 mg by mouth daily.     No current facility-administered medications on file prior to visit.    Past Medical History:  Diagnosis Date   Cancer [facility]) 2004   prostate cancer, Dr. Risa Grill   Colon polyps    Deep venous thrombosis ([place]) 1997   Postop   Depression  GERD (gastroesophageal reflux disease)    Hyperlipidemia    Hypertension    Radial head fracture, closed 09/18/2013   Shoulder fracture, left 02/23/2011   immobilized in sling , Dr Norris(no surgery)    Past Surgical History:  Procedure Laterality Date   COLONOSCOPY  2006   Negative,Dr.Edwards   colonoscopy with polypectomy      PMH of   ESOPHAGOGASTRODUODENOSCOPY N/A 02/23/2017   Procedure: ESOPHAGOGASTRODUODENOSCOPY (EGD);  Surgeon: Otis Brace, MD;  Location: [facility];  Service: Gastroenterology;  Laterality: N/A;   JOINT REPLACEMENT  1997, 2001   Total Hip replacement X 2   LUMBAR  SPINE SURGERY  09/28/2010   Dr Rolena Infante; for spinal stenosis   MECKEL DIVERTICULUM EXCISION     Age 6   PROSTATECTOMY  2004   w/ radiation, Dr Risa Grill   TOTAL HIP REVISION Left 02/14/2017   Procedure: Left hip polyethylene EXCHANGE;  Surgeon: Gaynelle Arabian, MD;  Location: [facility];  Service: Orthopedics;  Laterality: Left;   VASECTOMY      Social History   Socioeconomic History   Marital status: Widowed    Spouse name: Not on file   Number of children: 2   Years of education: Not on file   Highest education level: Not on file  Occupational History   Occupation: retired  Tobacco Use   Smoking status: Former    Packs/day: 0.50    Years: 5.00    Pack years: 2.50    Types: Cigarettes    Quit date: 03/13/1988    Years since quitting: 32.7   Smokeless tobacco: Former   Tobacco comments:    Quit at about age 79 / chewed tobacca x 1 year  Vaping Use   Vaping Use: Never used  Substance and Sexual Activity   Alcohol use: No    Alcohol/week: 0.0 standard drinks   Drug use: No   Sexual activity: Never  Other Topics Concern   Not on file  Social History Narrative   Lives alone; Wife passed 2012-12-17;   Died of ovarian cancer; breast cancer; other cancer;      Social Determinants of Health   Financial Resource Strain: Low Risk    Difficulty of Paying Living Expenses: Not hard at all  Food Insecurity: No Food Insecurity   Worried About Charity fundraiser in the Last Year: Never true   Arboriculturist in the Last Year: Never true  Transportation Needs: No Transportation Needs   Lack of Transportation (Medical): No   Lack of Transportation (Non-Medical): No  Physical Activity: Sufficiently Active   Days of Exercise per Week: 5 days   Minutes of Exercise per Session: 30 min  Stress: No Stress Concern Present   Feeling of Stress : Not at all  Social Connections: Moderately Integrated   Frequency of Communication with Friends and Family: More than three times a week    Frequency of Social Gatherings with Friends and Family: More than three times a week   Attends Religious Services: More than 4 times per year   Active Member of [employer] or Organizations: Yes   Attends Archivist Meetings: More than 4 times per year   Marital Status: Widowed    Family History  Problem Relation Age of Onset   Prostate cancer Father    Testicular cancer Son     Review of Systems  Constitutional:  Negative for chills and fever.  Respiratory:  Negative for cough, shortness of breath and wheezing.  Cardiovascular:  Positive for chest pain (chronic - no change). Negative for palpitations and leg swelling.  Genitourinary:  Positive for frequency.  Neurological:  Negative for light-headedness and headaches.      Objective:   Vitals:   12/10/20 1038  BP: 138/82  Pulse: 94  Temp: 97.8 F (36.6 C)  SpO2: 96%   BP Readings from Last 3 Encounters:  12/10/20 138/82  08/17/20 140/80  06/15/20 (!) 144/72   Wt Readings from Last 3 Encounters:  12/10/20 186 lb (84.4 kg)  08/17/20 187 lb (84.8 kg)  06/15/20 189 lb (85.7 kg)   Body mass index is 23.88 kg/m.   Physical Exam    Constitutional: Appears well-developed and well-nourished. No distress.  Head: Normocephalic and atraumatic.  Neck: Neck supple. No tracheal deviation present. No thyromegaly present.  No cervical lymphadenopathy Cardiovascular: Normal rate, regular rhythm and normal heart sounds.  No murmur heard. No carotid bruit .  No edema Pulmonary/Chest: Effort normal and breath sounds normal. No respiratory distress. No has no wheezes. No rales.  Skin: Skin is warm and dry. Not diaphoretic.  Psychiatric: Normal mood and affect. Behavior is normal.       Assessment & Plan:    See Problem List for Assessment and Plan of chronic medical problems.

## 2020-12-10 ENCOUNTER — Other Ambulatory Visit: Payer: Self-pay

## 2020-12-10 ENCOUNTER — Ambulatory Visit (INDEPENDENT_AMBULATORY_CARE_PROVIDER_SITE_OTHER): Payer: Medicare Other | Admitting: Internal Medicine

## 2020-12-10 ENCOUNTER — Encounter: Payer: Self-pay | Admitting: Internal Medicine

## 2020-12-10 VITALS — BP 138/82 | HR 94 | Temp 97.8°F | Ht 74.0 in | Wt 186.0 lb

## 2020-12-10 DIAGNOSIS — Z8673 Personal history of transient ischemic attack (TIA), and cerebral infarction without residual deficits: Secondary | ICD-10-CM

## 2020-12-10 DIAGNOSIS — M533 Sacrococcygeal disorders, not elsewhere classified: Secondary | ICD-10-CM

## 2020-12-10 DIAGNOSIS — R7303 Prediabetes: Secondary | ICD-10-CM | POA: Diagnosis not present

## 2020-12-10 DIAGNOSIS — E7849 Other hyperlipidemia: Secondary | ICD-10-CM | POA: Diagnosis not present

## 2020-12-10 DIAGNOSIS — D696 Thrombocytopenia, unspecified: Secondary | ICD-10-CM | POA: Diagnosis not present

## 2020-12-10 DIAGNOSIS — R32 Unspecified urinary incontinence: Secondary | ICD-10-CM

## 2020-12-10 MED ORDER — MIRABEGRON ER 25 MG PO TB24: 25.0000 mg | ORAL_TABLET | Freq: Every day | ORAL | Status: DC | PRN

## 2020-12-10 NOTE — Assessment & Plan Note (Signed)
Chronic Urinary incontinence with frequent urination He feels the Myrbetriq helps, but he only takes it as needed Continue Myrbetriq 25 mg daily as needed

## 2020-12-10 NOTE — Assessment & Plan Note (Signed)
Chronic No new concerning symptoms Continue Plavix 75 mg daily and atorvastatin 40 mg daily CBC, CMP, lipids

## 2020-12-10 NOTE — Assessment & Plan Note (Signed)
Acute Related to a fall last week-pain has improved Likely contusion Will defer any further imaging since his pain is improving

## 2020-12-10 NOTE — Assessment & Plan Note (Signed)
Chronic CBC 

## 2020-12-10 NOTE — Assessment & Plan Note (Signed)
Chronic Regular exercise and healthy diet encouraged Check lipid panel  Continue atorvastatin 40 mg daily 

## 2020-12-14 ENCOUNTER — Ambulatory Visit: Payer: Medicare Other | Admitting: Internal Medicine

## 2021-01-11 ENCOUNTER — Other Ambulatory Visit: Payer: Self-pay | Admitting: Internal Medicine

## 2021-01-13 ENCOUNTER — Telehealth: Payer: Self-pay | Admitting: Pharmacist

## 2021-01-13 NOTE — Progress Notes (Signed)
    Chronic Care Management Pharmacy Assistant   Name: Ronald Stafford  MRN: [id number] DOB: 07-31-1930   Reason for Encounter: Medication Review    Recent office visits:  12/10/20 Ronald Rail, MD-PCP (Fall) Orders: labs, no med changes  Recent consult visits:  None ID  Hospital visits:  None in previous 6 months  Medications: Outpatient Encounter Medications as of 01/13/2021  Medication Sig   acetaminophen (TYLENOL) 325 MG tablet Take 650 mg by mouth every 6 (six) hours as needed for moderate pain or headache.   atorvastatin (LIPITOR) 40 MG tablet TAKE ONE TABLET BY MOUTH EVERY MORNING   clopidogrel (PLAVIX) 75 MG tablet TAKE ONE TABLET BY MOUTH EVERY MORNING   gabapentin (NEURONTIN) 100 MG capsule Take 1 capsule (100 mg total) by mouth at bedtime.   mirabegron ER (MYRBETRIQ) 25 MG TB24 tablet Take 1 tablet (25 mg total) by mouth daily as needed.   Omega-3 Fatty Acids (FISH OIL) 500 MG CAPS Take 1 capsule by mouth daily.   vitamin C (ASCORBIC ACID) 250 MG tablet Take 250 mg by mouth daily.   No facility-administered encounter medications on file as of 01/13/2021.    BP Readings from Last 3 Encounters:  12/10/20 138/82  08/17/20 140/80  06/15/20 (!) 144/72    Lab Results  Component Value Date   HGBA1C 6.1 12/24/2019      Last adherence delivery date:10/20/20      Patient is due for next adherence delivery on: 01/24/21  Spoke with patient on 02/10/21 reviewed medications and coordinated delivery.[name] wanted to change date of delivery. States he has plenty on hand to last to the end of the month  This delivery to include: Adherence Packaging  90 Days  Vitamin C 500 mg Tab Take one tab by mouth every morning Omega 3 300 mg Softgel Take one capsul by mouth every morning Clopidogrel 75 mg Take one tab by mouth every morning Atorvastatin 40 mg Take one tab by mouth every morning  Patient declined the following medications this month:  Any concerns about your  medications? No  How often do you forget or accidentally miss a dose?  Patient states that he has had so much come up with death in the family that he has forgotten to take his meds  Do you use a pillbox? No  Is patient in packaging Yes  If yes  What is the date on your next pill pack?01/24/21  Any concerns or issues with your packaging?No   Refills requested from providers include: Clopidogrel 75 mg-cpp to request  Confirmed delivery date of 02/10/21, advised patient that pharmacy will contact them the morning of delivery.  Recent blood pressure readings are as follows:NA  Annual wellness visit in last year? No Most Recent BP reading:138/82 on 12/10/20   Ethelene Hal Clinical Pharmacist Assistant [phone]

## 2021-04-17 NOTE — Progress Notes (Signed)
Subjective:    Patient ID: Ronald Stafford, male    DOB: [birth year], 88 y.o.   MRN: [id number]  This visit occurred during the SARS-CoV-2 public health emergency.  Safety protocols were in place, including screening questions prior to the visit, additional usage of staff PPE, and extensive cleaning of exam room while observing appropriate contact time as indicated for disinfecting solutions.    HPI The patient is here for an acute visit.   Tingling x 2 weeks - wakes up with it . Better during day. Two medial fingers worse - progressed to all fingers.  No numbness during day.  No weakness.   No right sided wrist, elbow, shoulder or neck pain.  He sometimes gets symptoms on the left side.    When further questions he does state he does occasionally have neck pain.  He sometimes rolls up a towel and places it on the neck to help with the pain.   Medications and allergies reviewed with patient and updated if appropriate.  Patient Active Problem List   Diagnosis Date Noted   Coccyx pain 12/10/2020   Acquired cyst of kidney 06/15/2020   Gross hematuria 06/15/2020   Cervical radiculopathy 06/01/2020   Memory difficulty 06/01/2020   Acute pain of right shoulder 07/10/2019   Lightheadedness 01/03/2019   Urinary incontinence 10/31/2018   Left-sided chest wall pain 06/04/2018   Numbness and tingling of right hand 08/10/2017   Skin abnormalities 06/13/2017   History of total hip arthroplasty 03/28/2017   Symptomatic anemia 02/22/2017   Failed total hip arthroplasty ([place]) 02/14/2017   Osteoarthritis of left hip 12/10/2016   Acute right ankle pain 11/07/2016   Acute pain of left shoulder 10/04/2016   Chest pain 07/30/2016   History of DVT (deep vein thrombosis) 07/30/2016   History of TIA (transient ischemic attack) 07/30/2016   Thrombocytopenia ([place]) 07/30/2016   Carotid arterial disease ([place]) 07/20/2016   Right sided weakness 06/27/2016   Slurred speech 06/27/2016   Left wrist pain  08/11/2015   Frequent headaches 06/22/2015   Prediabetes 02/11/2015   Peroneal tendonitis of right lower extremity 01/12/2014   Mass of right ankle 12/31/2013   Ankle gives way, right 12/31/2013   Solitary pulmonary nodule 08/08/2012   SPINAL STENOSIS, LUMBAR 04/06/2010   LOW BACK PAIN SYNDROME 11/02/2009   FOOT DROP, RIGHT 05/21/2008   Hyperlipidemia 12/25/2006   PROSTATE CANCER, HX OF 12/25/2006   POLYP, COLON 05/02/2006    Current Outpatient Medications on File Prior to Visit  Medication Sig Dispense Refill   acetaminophen (TYLENOL) 325 MG tablet Take 650 mg by mouth every 6 (six) hours as needed for moderate pain or headache.     atorvastatin (LIPITOR) 40 MG tablet TAKE ONE TABLET BY MOUTH EVERY MORNING 90 tablet 1   clopidogrel (PLAVIX) 75 MG tablet TAKE ONE TABLET BY MOUTH EVERY MORNING 90 tablet 5   gabapentin (NEURONTIN) 100 MG capsule Take 1 capsule (100 mg total) by mouth at bedtime. 90 capsule 3   mirabegron ER (MYRBETRIQ) 25 MG TB24 tablet Take 1 tablet (25 mg total) by mouth daily as needed. 30 tablet    Omega-3 Fatty Acids (FISH OIL) 500 MG CAPS Take 1 capsule by mouth daily.     vitamin C (ASCORBIC ACID) 250 MG tablet Take 250 mg by mouth daily.     No current facility-administered medications on file prior to visit.    Past Medical History:  Diagnosis Date   Cancer [facility]) 2004  prostate cancer, Dr. Risa Grill   Colon polyps    Deep venous thrombosis ([place]) 1997   Postop   Depression    GERD (gastroesophageal reflux disease)    Hyperlipidemia    Hypertension    Radial head fracture, closed 09/18/2013   Shoulder fracture, left 02/23/2011   immobilized in sling , Dr Norris(no surgery)    Past Surgical History:  Procedure Laterality Date   COLONOSCOPY  2006   Negative,Dr.Edwards   colonoscopy with polypectomy      PMH of   ESOPHAGOGASTRODUODENOSCOPY N/A 02/23/2017   Procedure: ESOPHAGOGASTRODUODENOSCOPY (EGD);  Surgeon: Otis Brace, MD;  Location: [facility];  Service: Gastroenterology;  Laterality: N/A;   JOINT REPLACEMENT  1997, 2001   Total Hip replacement X 2   LUMBAR SPINE SURGERY  09/28/2010   Dr Rolena Infante; for spinal stenosis   MECKEL DIVERTICULUM EXCISION     Age 65   PROSTATECTOMY  2004   w/ radiation, Dr Risa Grill   TOTAL HIP REVISION Left 02/14/2017   Procedure: Left hip polyethylene EXCHANGE;  Surgeon: Gaynelle Arabian, MD;  Location: [facility];  Service: Orthopedics;  Laterality: Left;   VASECTOMY      Social History   Socioeconomic History   Marital status: Widowed    Spouse name: Not on file   Number of children: 2   Years of education: Not on file   Highest education level: Not on file  Occupational History   Occupation: retired  Tobacco Use   Smoking status: Former    Packs/day: 0.50    Years: 5.00    Pack years: 2.50    Types: Cigarettes    Quit date: 03/13/1988    Years since quitting: 33.1   Smokeless tobacco: Former   Tobacco comments:    Quit at about age 34 / chewed tobacca x 1 year  Vaping Use   Vaping Use: Never used  Substance and Sexual Activity   Alcohol use: No    Alcohol/week: 0.0 standard drinks   Drug use: No   Sexual activity: Never  Other Topics Concern   Not on file  Social History Narrative   Lives alone; Wife passed 12-07-2012;   Died of ovarian cancer; breast cancer; other cancer;      Social Determinants of Health   Financial Resource Strain: Low Risk    Difficulty of Paying Living Expenses: Not hard at all  Food Insecurity: No Food Insecurity   Worried About Charity fundraiser in the Last Year: Never true   Arboriculturist in the Last Year: Never true  Transportation Needs: No Transportation Needs   Lack of Transportation (Medical): No   Lack of Transportation (Non-Medical): No  Physical Activity: Sufficiently Active   Days of Exercise per Week: 5 days   Minutes of Exercise per Session: 30 min  Stress: No Stress Concern Present   Feeling of Stress : Not at all   Social Connections: Moderately Integrated   Frequency of Communication with Friends and Family: More than three times a week   Frequency of Social Gatherings with Friends and Family: More than three times a week   Attends Religious Services: More than 4 times per year   Active Member of [employer] or Organizations: Yes   Attends Archivist Meetings: More than 4 times per year   Marital Status: Widowed    Family History  Problem Relation Age of Onset   Prostate cancer Father    Testicular cancer Son  Review of Systems     Objective:   Vitals:   04/18/21 1538  BP: 140/62  Pulse: 100  Temp: 97.9 F (36.6 C)  SpO2: 97%   BP Readings from Last 3 Encounters:  04/18/21 140/62  12/10/20 138/82  08/17/20 140/80   Wt Readings from Last 3 Encounters:  04/18/21 185 lb 3.2 oz (84 kg)  12/10/20 186 lb (84.4 kg)  08/17/20 187 lb (84.8 kg)   Body mass index is 23.78 kg/m.   Physical Exam Constitutional:      General: He is not in acute distress.    Appearance: Normal appearance. He is not ill-appearing.  HENT:     Head: Normocephalic and atraumatic.  Musculoskeletal:        General: No tenderness (No tenderness with palpation right wrist, hand or fingers.  No tenderness with palpation posterior neck). Normal range of motion.  Skin:    General: Skin is warm and dry.     Findings: No bruising or erythema.  Neurological:     Mental Status: He is alert.     Sensory: No sensory deficit.     Motor: No weakness.           Assessment & Plan:    See Problem List for Assessment and Plan of chronic medical problems.

## 2021-04-18 ENCOUNTER — Encounter: Payer: Self-pay | Admitting: Internal Medicine

## 2021-04-18 ENCOUNTER — Other Ambulatory Visit: Payer: Self-pay

## 2021-04-18 ENCOUNTER — Ambulatory Visit (INDEPENDENT_AMBULATORY_CARE_PROVIDER_SITE_OTHER): Payer: Medicare Other | Admitting: Internal Medicine

## 2021-04-18 VITALS — BP 140/62 | HR 100 | Temp 97.9°F | Ht 74.0 in | Wt 185.2 lb

## 2021-04-18 DIAGNOSIS — R202 Paresthesia of skin: Secondary | ICD-10-CM | POA: Diagnosis not present

## 2021-04-18 NOTE — Patient Instructions (Signed)
° ° °  A referral was ordered for orthopedics for your hand symptoms.       Someone from their office will call you to schedule an appointment.

## 2021-04-18 NOTE — Assessment & Plan Note (Addendum)
New Coming in with 2 weeks of tingling in right hand that is present when he first wakes up in the morning and resolves once he is up and moving around-no tingling during the day No weakness He has had this in the past about 3 years ago Does have occasional neck pain CT of the neck in the past showed arthritis and foraminal stenosis in multiple areas Likely cervical radiculopathy Will refer to orthopedics for further evaluation Not currently taking gabapentin, but has taken it before at night-advised that he can retry this if he would like

## 2021-04-23 ENCOUNTER — Other Ambulatory Visit: Payer: Self-pay | Admitting: Internal Medicine

## 2021-04-25 ENCOUNTER — Ambulatory Visit: Payer: Medicare Other | Admitting: Orthopedic Surgery

## 2021-04-25 ENCOUNTER — Ambulatory Visit: Payer: Self-pay

## 2021-04-25 ENCOUNTER — Other Ambulatory Visit: Payer: Self-pay

## 2021-04-25 ENCOUNTER — Encounter: Payer: Self-pay | Admitting: Orthopedic Surgery

## 2021-04-25 VITALS — BP 132/77 | HR 90

## 2021-04-25 DIAGNOSIS — R202 Paresthesia of skin: Secondary | ICD-10-CM | POA: Diagnosis not present

## 2021-04-25 DIAGNOSIS — M542 Cervicalgia: Secondary | ICD-10-CM | POA: Diagnosis not present

## 2021-04-25 DIAGNOSIS — M79641 Pain in right hand: Secondary | ICD-10-CM | POA: Diagnosis not present

## 2021-04-25 NOTE — Progress Notes (Signed)
Office Visit Note   Patient: Ronald Stafford           Date of Birth: 28-May-1930           MRN: [id number] Visit Date: 04/25/2021              Requested by: Binnie Rail, MD [place],  [place] [address] PCP: Binnie Rail, MD   Assessment & Plan: Visit Diagnoses:  1. Pain in right hand   2. Neck pain   3. Right hand paresthesia     Plan: Discussed with patient that his right hand numbness and paresthesias may be related to nerve entrapment at the wrist.  We discussed the nature of carpal tunnel syndrome, its diagnosis, and its treatment options.  We will get an EMG/nerve conduction study of the right upper extremity to further evaluate.  See him back in the office once the study is completed.  Follow-Up Instructions: No follow-ups on file.   Orders:  Orders Placed This Encounter  Procedures   XR Hand Complete Right   XR Cervical Spine 2 or 3 views   No orders of the defined types were placed in this encounter.     Procedures: No procedures performed   Clinical Data: No additional findings.   Subjective: Chief Complaint  Patient presents with   Right Hand - Follow-up    This is an 88 year old right-hand-dominant male who presents with numbness and tingling in the right hand.  Is been going on for at least a month.  He says his fingers are all numb at night.  This involves all his fingers.  This is worse when lying flat.  He has minimal symptoms during the day.  He has no weakness in this extremity.  He has no history of diabetes, thyroid disease, wrist trauma, or cervical spine pain.  He denies any radiculopathy.   Review of Systems   Objective: Vital Signs: BP 132/77 (BP Location: Left Arm, Patient Position: Sitting, Cuff Size: Large)    Pulse 90    SpO2 96%   Physical Exam Constitutional:      Appearance: Normal appearance.  Cardiovascular:     Rate and Rhythm: Normal rate.     Pulses: Normal pulses.  Pulmonary:     Effort: Pulmonary  effort is normal.  Skin:    General: Skin is warm and dry.     Capillary Refill: Capillary refill takes less than 2 seconds.  Neurological:     Mental Status: He is alert.    Right Hand Exam   Tenderness  The patient is experiencing no tenderness.   Range of Motion  The patient has normal right wrist ROM.   Other  Erythema: absent Sensation: normal Pulse: present  Comments:  + Tinel at wrist.  Negative Phalen.  Negative Tinel at elbow.  SILT throughout hand.  5/5 thenar motor strength without atrophy.      Specialty Comments:  No specialty comments available.  Imaging: No results found.   PMFS History: Patient Active Problem List   Diagnosis Date Noted   Coccyx pain 12/10/2020   Acquired cyst of kidney 06/15/2020   Gross hematuria 06/15/2020   Cervical radiculopathy 06/01/2020   Memory difficulty 06/01/2020   Acute pain of right shoulder 07/10/2019   Lightheadedness 01/03/2019   Urinary incontinence 10/31/2018   Left-sided chest wall pain 06/04/2018   Right hand paresthesia 08/10/2017   Skin abnormalities 06/13/2017   History of total hip arthroplasty 03/28/2017  Symptomatic anemia 02/22/2017   Failed total hip arthroplasty ([place]) 02/14/2017   Osteoarthritis of left hip 12/10/2016   Acute right ankle pain 11/07/2016   Acute pain of left shoulder 10/04/2016   Chest pain 07/30/2016   History of DVT (deep vein thrombosis) 07/30/2016   History of TIA (transient ischemic attack) 07/30/2016   Thrombocytopenia ([place]) 07/30/2016   Carotid arterial disease ([place]) 07/20/2016   Right sided weakness 06/27/2016   Slurred speech 06/27/2016   Left wrist pain 08/11/2015   Frequent headaches 06/22/2015   Prediabetes 02/11/2015   Peroneal tendonitis of right lower extremity 01/12/2014   Mass of right ankle 12/31/2013   Ankle gives way, right 12/31/2013   Solitary pulmonary nodule 08/08/2012   SPINAL STENOSIS, LUMBAR 04/06/2010   LOW BACK PAIN SYNDROME 11/02/2009    FOOT DROP, RIGHT 05/21/2008   Hyperlipidemia 12/25/2006   PROSTATE CANCER, HX OF 12/25/2006   POLYP, COLON 05/02/2006   Past Medical History:  Diagnosis Date   Cancer [facility]) 2004   prostate cancer, Dr. Risa Grill   Colon polyps    Deep venous thrombosis ([place]) 1997   Postop   Depression    GERD (gastroesophageal reflux disease)    Hyperlipidemia    Hypertension    Radial head fracture, closed 09/18/2013   Shoulder fracture, left 02/23/2011   immobilized in sling , Dr Norris(no surgery)    Family History  Problem Relation Age of Onset   Prostate cancer Father    Testicular cancer Son     Past Surgical History:  Procedure Laterality Date   COLONOSCOPY  2006   Negative,Dr.Edwards   colonoscopy with polypectomy      PMH of   ESOPHAGOGASTRODUODENOSCOPY N/A 02/23/2017   Procedure: ESOPHAGOGASTRODUODENOSCOPY (EGD);  Surgeon: Otis Brace, MD;  Location: [facility];  Service: Gastroenterology;  Laterality: N/A;   JOINT REPLACEMENT  1997, 2001   Total Hip replacement X 2   LUMBAR SPINE SURGERY  09/28/2010   Dr Rolena Infante; for spinal stenosis   MECKEL DIVERTICULUM EXCISION     Age 89   PROSTATECTOMY  2004   w/ radiation, Dr Risa Grill   TOTAL HIP REVISION Left 02/14/2017   Procedure: Left hip polyethylene EXCHANGE;  Surgeon: Gaynelle Arabian, MD;  Location: [facility];  Service: Orthopedics;  Laterality: Left;   VASECTOMY     Social History   Occupational History   Occupation: retired  Tobacco Use   Smoking status: Former    Packs/day: 0.50    Years: 5.00    Pack years: 2.50    Types: Cigarettes    Quit date: 03/13/1988    Years since quitting: 33.1   Smokeless tobacco: Former   Tobacco comments:    Quit at about age 34 / chewed tobacca x 1 year  Vaping Use   Vaping Use: Never used  Substance and Sexual Activity   Alcohol use: No    Alcohol/week: 0.0 standard drinks   Drug use: No   Sexual activity: Never

## 2021-05-03 ENCOUNTER — Telehealth: Payer: Self-pay | Admitting: Physical Medicine and Rehabilitation

## 2021-05-03 NOTE — Telephone Encounter (Signed)
Patient returned call asked for a call back    # [phone]

## 2021-05-17 ENCOUNTER — Ambulatory Visit (INDEPENDENT_AMBULATORY_CARE_PROVIDER_SITE_OTHER): Payer: Medicare Other | Admitting: Physical Medicine and Rehabilitation

## 2021-05-17 ENCOUNTER — Other Ambulatory Visit: Payer: Self-pay

## 2021-05-17 DIAGNOSIS — R202 Paresthesia of skin: Secondary | ICD-10-CM

## 2021-05-17 NOTE — Progress Notes (Signed)
? ?Ronald Stafford - 88 y.o. male MRN [id number]  Date of birth: 10/03/1930 ? ?Office Visit Note: ?Visit Date: 05/17/2021 ?PCP: Binnie Rail, MD ?Referred by: Sherilyn Cooter, MD ? ?Subjective: ?Chief Complaint  ?Patient presents with  ? Right Arm - Numbness  ? ?HPI:  Ronald Stafford is a 88 y.o. male who comes in today at the request of Dr. Sherilyn Cooter for electrodiagnostic study of the Right upper extremities.  Patient is Right hand dominant.  He reports the greatest severity has been over the last couple of months.  He reports that his whole hand will get numb particularly at night with nocturnal complaints.  Upon some questioning it is mostly the radial 3 digits but then at times he can get a dysesthesia in the fourth and fifth digit.[name]  He has no prior electrodiagnostic study.  He has no history of diabetes, thyroid disease, wrist trauma, or cervical spine pain.  He denies any radiculopathy. ? ?ROS Otherwise per HPI. ? ?Assessment & Plan: ?Visit Diagnoses:  ?  ICD-10-CM   ?1. Paresthesia of skin  R20.2 NCV with EMG (electromyography)  ?  ?  ?Plan: Impression: ?The above electrodiagnostic study is ABNORMAL and reveals evidence of: ? ?a severe right median nerve entrapment at the wrist (carpal tunnel syndrome) affecting sensory and motor components.  ? ?a moderate to severe right ulnar nerve entrapment at the elbow (cubital tunnel syndrome) affecting sensory and motor components.  ? ?There is no significant electrodiagnostic evidence of any other focal nerve entrapment, brachial plexopathy or cervical radiculopathy.  **This does appear to be more of a compression neuropathy and not a polyneuropathy. ? ?Recommendations: ?1.  Follow-up with referring physician. ?2.  Continue current management of symptoms. ?3.  Suggest surgical evaluation. ? ?Meds & Orders: No orders of the defined types were placed in this encounter. ?  ?Orders Placed This Encounter  ?Procedures  ? NCV with EMG (electromyography)  ?  ?Follow-up:  Return in about 2 weeks (around 05/31/2021) for Sherilyn Cooter, MD.  ? ?Procedures: ?No procedures performed  ?EMG & NCV Findings: ?Evaluation of the right median motor nerve showed prolonged distal onset latency (5.9 ms), reduced amplitude (3.2 mV), and decreased conduction velocity (Elbow-Wrist, 40 m/s).  The right ulnar motor nerve showed decreased conduction velocity (A Elbow-B Elbow, 40 m/s).  The right median (across palm) sensory nerve showed no response (Wrist) and no response (Palm).  The right ulnar sensory nerve showed prolonged distal peak latency (4.0 ms), reduced amplitude (8.3 ?V), and decreased conduction velocity (Wrist-5th Digit, 35 m/s).  All remaining nerves (as indicated in the following tables) were within normal limits.   ? ?Needle evaluation of the right abductor pollicis brevis muscle showed slightly increased spontaneous activity.  All remaining muscles (as indicated in the following table) showed no evidence of electrical instability.   ? ?Impression: ?The above electrodiagnostic study is ABNORMAL and reveals evidence of: ? ?a severe right median nerve entrapment at the wrist (carpal tunnel syndrome) affecting sensory and motor components.  ? ?a moderate to severe right ulnar nerve entrapment at the elbow (cubital tunnel syndrome) affecting sensory and motor components.  ? ?There is no significant electrodiagnostic evidence of any other focal nerve entrapment, brachial plexopathy or cervical radiculopathy.  **This does appear to be more of a compression neuropathy and not a polyneuropathy. ? ?Recommendations: ?1.  Follow-up with referring physician. ?2.  Continue current management of symptoms. ?3.  Suggest surgical evaluation. ? ?___________________________ ?Ronald Stafford FAAPMR ?Board  Certified, Tax adviser of Physical Medicine and Rehabilitation ? ? ? ?Nerve Conduction Studies ?Anti Sensory Summary Table ? ? Stim Site NR Peak (ms) Norm Peak (ms) P-T Amp (?V) Norm P-T Amp Site1 Site2  Delta-P (ms) Dist (cm) Vel (m/s) Norm Vel (m/s)  ?Right Median Acr Palm Anti Sensory (2nd Digit)  30?C  ?Wrist *NR  <3.6  >10 Wrist Palm  0.0    ?Palm *NR  <2.0          ?Right Radial Anti Sensory (Base 1st Digit)  29.6?C  ?Wrist    2.6 <3.1 4.8  Wrist Base 1st Digit 2.6 0.0    ?Right Ulnar Anti Sensory (5th Digit)  30.4?C  ?Wrist    *4.0 <3.7 *8.3 >15.0 Wrist 5th Digit 4.0 14.0 *35 >38  ? ?Motor Summary Table ? ? Stim Site NR Onset (ms) Norm Onset (ms) O-P Amp (mV) Norm O-P Amp Site1 Site2 Delta-0 (ms) Dist (cm) Vel (m/s) Norm Vel (m/s)  ?Right Median Motor (Abd Poll Brev)  30?C  ?Wrist    *5.9 <4.2 *3.2 >5 Elbow Wrist 6.3 25.0 *40 >50  ?Elbow    12.2  2.2         ?Right Ulnar Motor (Abd Dig Min)  30.1?C  ?Wrist    3.4 <4.2 7.5 >3 B Elbow Wrist 4.6 25.0 54 >53  ?B Elbow    8.0  6.2  A Elbow B Elbow 2.5 10.0 *40 >53  ?A Elbow    10.5  4.4         ? ?EMG ? ? Side Muscle Nerve Root Ins Act Fibs Psw Amp Dur Poly Recrt Int Fraser Din Comment  ?Right Abd Poll Brev Median C8-T1 Nml *1+ *1+ Nml Nml 0 Nml Nml   ?Right 1stDorInt Ulnar C8-T1 Nml Nml Nml Nml Nml 0 Nml Nml   ?Right PronatorTeres Median C6-7 Nml Nml Nml Nml Nml 0 Nml Nml   ?Right Biceps Musculocut C5-6 Nml Nml Nml Nml Nml 0 Nml Nml   ?Right Deltoid Axillary C5-6 Nml Nml Nml Nml Nml 0 Nml Nml   ? ? ?Nerve Conduction Studies ?Anti Sensory Left/Right Comparison ? ? Stim Site L Lat (ms) R Lat (ms) L-R Lat (ms) L Amp (?V) R Amp (?V) L-R Amp (%) Site1 Site2 L Vel (m/s) R Vel (m/s) L-R Vel (m/s)  ?Median Acr Palm Anti Sensory (2nd Digit)  30?C  ?Wrist       Wrist Palm     ?Palm             ?Radial Anti Sensory (Base 1st Digit)  29.6?C  ?Wrist  2.6   4.8  Wrist Base 1st Digit     ?Ulnar Anti Sensory (5th Digit)  30.4?C  ?Wrist  *4.0   *8.3  Wrist 5th Digit  *35   ? ?Motor Left/Right Comparison ? ? Stim Site L Lat (ms) R Lat (ms) L-R Lat (ms) L Amp (mV) R Amp (mV) L-R Amp (%) Site1 Site2 L Vel (m/s) R Vel (m/s) L-R Vel (m/s)  ?Median Motor (Abd Poll Brev)  30?C  ?Wrist  *5.9    *3.2  Elbow Wrist  *40   ?Elbow  12.2   2.2        ?Ulnar Motor (Abd Dig Min)  30.1?C  ?Wrist  3.4   7.5  B Elbow Wrist  54   ?B Elbow  8.0   6.2  A Elbow B Elbow  *40   ?A Elbow  10.5  4.4        ? ? ? ?Waveforms: ?    ? ?   ? ?  ? ?Clinical History: ?No specialty comments available.  ? ? ? ?Objective:  VS:  HT:    WT:   BMI:     BP:   HR: bpm  TEMP: ( )  RESP:  ?Physical Exam ?Musculoskeletal:     ?   General: No tenderness.  ?   Comments: Inspection reveals flattening of the right APB but no atrophy of the bilateral FDI or hand intrinsics. There is no swelling, color changes, allodynia or dystrophic changes. There is 5 out of 5 strength in the bilateral wrist extension, finger abduction and long finger flexion. There is impaired sensation to light touch in right median nerve distribution with dysesthesia in the ulnar distribution. There is a negative Hoffmann's test bilaterally.  ?Skin: ?   General: Skin is warm and dry.  ?   Findings: No erythema or rash.  ?Neurological:  ?   General: No focal deficit present.  ?   Mental Status: He is alert and oriented to person, place, and time.  ?   Sensory: No sensory deficit.  ?   Motor: No weakness or abnormal muscle tone.  ?   Coordination: Coordination normal.  ?   Gait: Gait normal.  ?Psychiatric:     ?   Mood and Affect: Mood normal.     ?   Behavior: Behavior normal.     ?   Thought Content: Thought content normal.  ?  ? ?Imaging: ?No results found. ?

## 2021-05-17 NOTE — Progress Notes (Signed)
Having numbness and tingling in first 3 digits of right hand. ?Mostly when sleeping. ?Supporting neck eases pain. ?Right hand dominant. ?Falls quite a bit. Has an issue with balance. ? ? ? ? ?Numeric Pain Rating Scale and Functional Assessment ?Average Pain 0 ? ? ?In the last MONTH (on 0-10 scale) has pain interfered with the following? Every day/ night when sleeping or when arms hang down. ? ?1. General activity like being  able to carry out your everyday physical activities such as walking, climbing stairs, carrying groceries, or moving a chair?  ?Rating(0) ? ? ?+Driver, -BT, -Dye Allergies. ? ?

## 2021-05-18 NOTE — Procedures (Signed)
EMG & NCV Findings: ?Evaluation of the right median motor nerve showed prolonged distal onset latency (5.9 ms), reduced amplitude (3.2 mV), and decreased conduction velocity (Elbow-Wrist, 40 m/s).  The right ulnar motor nerve showed decreased conduction velocity (A Elbow-B Elbow, 40 m/s).  The right median (across palm) sensory nerve showed no response (Wrist) and no response (Palm).  The right ulnar sensory nerve showed prolonged distal peak latency (4.0 ms), reduced amplitude (8.3 ?V), and decreased conduction velocity (Wrist-5th Digit, 35 m/s).  All remaining nerves (as indicated in the following tables) were within normal limits.   ? ?Needle evaluation of the right abductor pollicis brevis muscle showed slightly increased spontaneous activity.  All remaining muscles (as indicated in the following table) showed no evidence of electrical instability.   ? ?Impression: ?The above electrodiagnostic study is ABNORMAL and reveals evidence of: ? ?a severe right median nerve entrapment at the wrist (carpal tunnel syndrome) affecting sensory and motor components.  ? ?a moderate to severe right ulnar nerve entrapment at the elbow (cubital tunnel syndrome) affecting sensory and motor components.  ? ?There is no significant electrodiagnostic evidence of any other focal nerve entrapment, brachial plexopathy or cervical radiculopathy.  **This does appear to be more of a compression neuropathy and not a polyneuropathy. ? ?Recommendations: ?1.  Follow-up with referring physician. ?2.  Continue current management of symptoms. ?3.  Suggest surgical evaluation. ? ?___________________________ ?Laurence Spates FAAPMR ?Board Certified, Tax adviser of Physical Medicine and Rehabilitation ? ? ? ?Nerve Conduction Studies ?Anti Sensory Summary Table ? ? Stim Site NR Peak (ms) Norm Peak (ms) P-T Amp (?V) Norm P-T Amp Site1 Site2 Delta-P (ms) Dist (cm) Vel (m/s) Norm Vel (m/s)  ?Right Median Acr Palm Anti Sensory (2nd Digit)  30?C  ?Wrist  *NR  <3.6  >10 Wrist Palm  0.0    ?Palm *NR  <2.0          ?Right Radial Anti Sensory (Base 1st Digit)  29.6?C  ?Wrist    2.6 <3.1 4.8  Wrist Base 1st Digit 2.6 0.0    ?Right Ulnar Anti Sensory (5th Digit)  30.4?C  ?Wrist    *4.0 <3.7 *8.3 >15.0 Wrist 5th Digit 4.0 14.0 *35 >38  ? ?Motor Summary Table ? ? Stim Site NR Onset (ms) Norm Onset (ms) O-P Amp (mV) Norm O-P Amp Site1 Site2 Delta-0 (ms) Dist (cm) Vel (m/s) Norm Vel (m/s)  ?Right Median Motor (Abd Poll Brev)  30?C  ?Wrist    *5.9 <4.2 *3.2 >5 Elbow Wrist 6.3 25.0 *40 >50  ?Elbow    12.2  2.2         ?Right Ulnar Motor (Abd Dig Min)  30.1?C  ?Wrist    3.4 <4.2 7.5 >3 B Elbow Wrist 4.6 25.0 54 >53  ?B Elbow    8.0  6.2  A Elbow B Elbow 2.5 10.0 *40 >53  ?A Elbow    10.5  4.4         ? ?EMG ? ? Side Muscle Nerve Root Ins Act Fibs Psw Amp Dur Poly Recrt Int Fraser Din Comment  ?Right Abd Poll Brev Median C8-T1 Nml *1+ *1+ Nml Nml 0 Nml Nml   ?Right 1stDorInt Ulnar C8-T1 Nml Nml Nml Nml Nml 0 Nml Nml   ?Right PronatorTeres Median C6-7 Nml Nml Nml Nml Nml 0 Nml Nml   ?Right Biceps Musculocut C5-6 Nml Nml Nml Nml Nml 0 Nml Nml   ?Right Deltoid Axillary C5-6 Nml Nml Nml Nml Nml 0 Nml  Nml   ? ? ?Nerve Conduction Studies ?Anti Sensory Left/Right Comparison ? ? Stim Site L Lat (ms) R Lat (ms) L-R Lat (ms) L Amp (?V) R Amp (?V) L-R Amp (%) Site1 Site2 L Vel (m/s) R Vel (m/s) L-R Vel (m/s)  ?Median Acr Palm Anti Sensory (2nd Digit)  30?C  ?Wrist       Wrist Palm     ?Palm             ?Radial Anti Sensory (Base 1st Digit)  29.6?C  ?Wrist  2.6   4.8  Wrist Base 1st Digit     ?Ulnar Anti Sensory (5th Digit)  30.4?C  ?Wrist  *4.0   *8.3  Wrist 5th Digit  *35   ? ?Motor Left/Right Comparison ? ? Stim Site L Lat (ms) R Lat (ms) L-R Lat (ms) L Amp (mV) R Amp (mV) L-R Amp (%) Site1 Site2 L Vel (m/s) R Vel (m/s) L-R Vel (m/s)  ?Median Motor (Abd Poll Brev)  30?C  ?Wrist  *5.9   *3.2  Elbow Wrist  *40   ?Elbow  12.2   2.2        ?Ulnar Motor (Abd Dig Min)  30.1?C  ?Wrist  3.4   7.5  B  Elbow Wrist  54   ?B Elbow  8.0   6.2  A Elbow B Elbow  *40   ?A Elbow  10.5   4.4        ? ? ? ?Waveforms: ?    ? ?   ? ? ?

## 2021-05-24 ENCOUNTER — Ambulatory Visit: Payer: Medicare Other | Admitting: Orthopedic Surgery

## 2021-05-24 ENCOUNTER — Encounter: Payer: Self-pay | Admitting: Orthopedic Surgery

## 2021-05-24 ENCOUNTER — Other Ambulatory Visit: Payer: Self-pay

## 2021-05-24 DIAGNOSIS — G5621 Lesion of ulnar nerve, right upper limb: Secondary | ICD-10-CM | POA: Diagnosis not present

## 2021-05-24 DIAGNOSIS — G5601 Carpal tunnel syndrome, right upper limb: Secondary | ICD-10-CM

## 2021-05-24 NOTE — Progress Notes (Signed)
? ?Office Visit Note ?  ?Patient: Ronald Stafford           ?Date of Birth: 1930-06-14           ?MRN: [id number] ?Visit Date: 05/24/2021 ?             ?Requested by: Binnie Rail, MD ?[place],  [place] [address] ?PCP: Binnie Rail, MD ? ? ?Assessment & Plan: ?Visit Diagnoses: No diagnosis found. ? ?Plan: We discussed the diagnosis, prognosis, non-operative and operative treatment options for both carpal and cubital tunnel syndrome. ? ?After our discussion, the patient would like to proceed with a night splint for the right wrist.  He is not interested in discussing carpal or cubital tunnel releases at this point.  We reviewed the risks and benefits of conservative management.  The patient expressed understanding of the reasoning and strategy going forward. ? ?All patient questions and concerns were addressed.  ? ? ?Follow-Up Instructions: No follow-ups on file.  ? ?Orders:  ?No orders of the defined types were placed in this encounter. ? ?No orders of the defined types were placed in this encounter. ? ? ? ? Procedures: ?No procedures performed ? ? ?Clinical Data: ?No additional findings. ? ? ?Subjective: ?Chief Complaint  ?Patient presents with  ? Right Hand - Follow-up  ?  Review EMG/NCS  ? ? ?This is an 88 year old right-hand-dominant male who presents for follow up of numbness and tingling in the right hand.  His biggest complaint is numbness and tingling at night.  This usually involves the thumb, index, middle finger but will occasionally involve the ring and small finger.  He has minimal to no symptoms during the day.  This is not terribly bothersome for him.  He has worn a right wrist brace for another issue but has not worn this at night.  He recently underwent EMG/nerve conduction study which demonstrated a severe right median nerve entrapment at the wrist and a moderate to severe right ulnar entrapment at the elbow.  He is not interested in pursuing anything beyond splinting at this  time. ? ? ? ? ?Review of Systems ? ? ?Objective: ?Vital Signs: There were no vitals taken for this visit. ? ?Physical Exam ? ?Right Hand Exam  ? ?Tenderness  ?The patient is experiencing no tenderness.  ? ?Tests  ?Phalen?s sign: positive ?Tinel's sign (median nerve): negative ? ?Other  ?Erythema: absent ?Sensation: normal ?Pulse: present ? ? ?Right Elbow Exam  ? ?Tenderness  ?The patient is experiencing no tenderness.  ? ?Range of Motion  ?The patient has normal right elbow ROM. ? ?Muscle Strength  ?The patient has normal right elbow strength. ? ?Tests  ?Tinel's sign (cubital tunnel): negative ? ?Other  ?Erythema: absent ?Sensation: normal ?Pulse: present ? ? ? ? ?Specialty Comments:  ?No specialty comments available. ? ?Imaging: ?No results found. ? ? ?PMFS History: ?Patient Active Problem List  ? Diagnosis Date Noted  ? Coccyx pain 12/10/2020  ? Acquired cyst of kidney 06/15/2020  ? Gross hematuria 06/15/2020  ? Cervical radiculopathy 06/01/2020  ? Memory difficulty 06/01/2020  ? Acute pain of right shoulder 07/10/2019  ? Lightheadedness 01/03/2019  ? Urinary incontinence 10/31/2018  ? Left-sided chest wall pain 06/04/2018  ? Right hand paresthesia 08/10/2017  ? Skin abnormalities 06/13/2017  ? History of total hip arthroplasty 03/28/2017  ? Symptomatic anemia 02/22/2017  ? Failed total hip arthroplasty ([place]) 02/14/2017  ? Osteoarthritis of left hip 12/10/2016  ? Acute right  ankle pain 11/07/2016  ? Acute pain of left shoulder 10/04/2016  ? Chest pain 07/30/2016  ? History of DVT (deep vein thrombosis) 07/30/2016  ? History of TIA (transient ischemic attack) 07/30/2016  ? Thrombocytopenia ([place]) 07/30/2016  ? Carotid arterial disease ([place]) 07/20/2016  ? Right sided weakness 06/27/2016  ? Slurred speech 06/27/2016  ? Left wrist pain 08/11/2015  ? Frequent headaches 06/22/2015  ? Prediabetes 02/11/2015  ? Peroneal tendonitis of right lower extremity 01/12/2014  ? Mass of right ankle 12/31/2013  ? Ankle gives way,  right 12/31/2013  ? Solitary pulmonary nodule 08/08/2012  ? SPINAL STENOSIS, LUMBAR 04/06/2010  ? LOW BACK PAIN SYNDROME 11/02/2009  ? FOOT DROP, RIGHT 05/21/2008  ? Hyperlipidemia 12/25/2006  ? PROSTATE CANCER, HX OF 12/25/2006  ? POLYP, COLON 05/02/2006  ? ?Past Medical History:  ?Diagnosis Date  ? Cancer [facility]) 2004  ? prostate cancer, Dr. Risa Grill  ? Colon polyps   ? Deep venous thrombosis ([place]) 1997  ? Postop  ? Depression   ? GERD (gastroesophageal reflux disease)   ? Hyperlipidemia   ? Hypertension   ? Radial head fracture, closed 09/18/2013  ? Shoulder fracture, left 02/23/2011  ? immobilized in sling , Dr Norris(no surgery)  ?  ?Family History  ?Problem Relation Age of Onset  ? Prostate cancer Father   ? Testicular cancer Son   ?  ?Past Surgical History:  ?Procedure Laterality Date  ? COLONOSCOPY  2006  ? Negative,Dr.Edwards  ? colonoscopy with polypectomy    ?  PMH of  ? ESOPHAGOGASTRODUODENOSCOPY N/A 02/23/2017  ? Procedure: ESOPHAGOGASTRODUODENOSCOPY (EGD);  Surgeon: Otis Brace, MD;  Location: [facility];  Service: Gastroenterology;  Laterality: N/A;  ? JOINT REPLACEMENT  1997, 2001  ? Total Hip replacement X 2  ? LUMBAR SPINE SURGERY  09/28/2010  ? Dr Rolena Infante; for spinal stenosis  ? MECKEL DIVERTICULUM EXCISION    ? Age 58  ? PROSTATECTOMY  2004  ? w/ radiation, Dr Risa Grill  ? TOTAL HIP REVISION Left 02/14/2017  ? Procedure: Left hip polyethylene EXCHANGE;  Surgeon: Gaynelle Arabian, MD;  Location: [facility];  Service: Orthopedics;  Laterality: Left;  ? VASECTOMY    ? ?Social History  ? ?Occupational History  ? Occupation: retired  ?Tobacco Use  ? Smoking status: Former  ?  Packs/day: 0.50  ?  Years: 5.00  ?  Pack years: 2.50  ?  Types: Cigarettes  ?  Quit date: 03/13/1988  ?  Years since quitting: 33.2  ? Smokeless tobacco: Former  ? Tobacco comments:  ?  Quit at about age 32 / chewed tobacca x 1 year  ?Vaping Use  ? Vaping Use: Never used  ?Substance and Sexual Activity  ? Alcohol use: No  ?  Alcohol/week:  0.0 standard drinks  ? Drug use: No  ? Sexual activity: Never  ? ? ? ? ? ? ?

## 2021-06-01 ENCOUNTER — Encounter: Payer: Self-pay | Admitting: Internal Medicine

## 2021-06-01 ENCOUNTER — Telehealth: Payer: Self-pay | Admitting: Internal Medicine

## 2021-06-01 NOTE — Telephone Encounter (Signed)
No note needed 

## 2021-06-01 NOTE — Progress Notes (Signed)
? ? ? ? ?Subjective:  ? ? Patient ID: Ronald Stafford, male    DOB: Aug 08, 1930, 88 y.o.   MRN: [id number] ? ?This visit occurred during the SARS-CoV-2 public health emergency.  Safety protocols were in place, including screening questions prior to the visit, additional usage of staff PPE, and extensive cleaning of exam room while observing appropriate contact time as indicated for disinfecting solutions.   ? ? ?HPI ?Ronald Stafford is here for follow up of his chronic medical problems, including prediabetes, hld, h/o TIA, cervical radiculopathy/HA's ? ?Recent EMG - sever R  CTS, mod-sev R cubital tunnel.  He is using a brace and that is helping.  ? ?He has a pain in his left chest - it started a couple of days ago.  It is tender when he pushes on it.  He does falls frequently.  It hurts when he takes a deep breath.   ? ?When he stands and if he turns he will fall - he tries to be careful - it tends to occur in the yard.  He wears the brace on his right foot and it helps.   ? ?Medications and allergies reviewed with patient and updated if appropriate. ? ?Current Outpatient Medications on File Prior to Visit  ?Medication Sig Dispense Refill  ? acetaminophen (TYLENOL) 325 MG tablet Take 650 mg by mouth every 6 (six) hours as needed for moderate pain or headache.    ? atorvastatin (LIPITOR) 40 MG tablet TAKE ONE TABLET BY MOUTH EVERY MORNING 90 tablet 1  ? clopidogrel (PLAVIX) 75 MG tablet TAKE ONE TABLET BY MOUTH EVERY MORNING 90 tablet 5  ? Omega-3 Fatty Acids (FISH OIL) 500 MG CAPS Take 1 capsule by mouth daily.    ? vitamin C (ASCORBIC ACID) 250 MG tablet Take 250 mg by mouth daily.    ? mirabegron ER (MYRBETRIQ) 25 MG TB24 tablet Take 1 tablet (25 mg total) by mouth daily as needed. (Patient not taking: Reported on 05/17/2021) 30 tablet   ? ?No current facility-administered medications on file prior to visit.  ? ? ? ?Review of Systems  ?Constitutional:  Negative for fever.  ?Respiratory:  Negative for cough, shortness of breath  and wheezing.   ?Cardiovascular:  Positive for chest pain. Negative for palpitations and leg swelling.  ?Genitourinary:  Positive for frequency.  ?Neurological:  Positive for headaches (not as bad). Negative for light-headedness.  ? ?   ?Objective:  ? ?Vitals:  ? 06/02/21 1059  ?BP: 134/74  ?Pulse: 89  ?Temp: 98.1 ?F (36.7 ?C)  ?SpO2: 98%  ? ?BP Readings from Last 3 Encounters:  ?06/02/21 134/74  ?04/25/21 132/77  ?04/18/21 140/62  ? ?Wt Readings from Last 3 Encounters:  ?06/02/21 186 lb (84.4 kg)  ?04/18/21 185 lb 3.2 oz (84 kg)  ?12/10/20 186 lb (84.4 kg)  ? ?Body mass index is 23.88 kg/m?. ? ?  ?Physical Exam ?Constitutional:   ?   General: He is not in acute distress. ?   Appearance: Normal appearance. He is not ill-appearing.  ?HENT:  ?   Head: Normocephalic and atraumatic.  ?Eyes:  ?   Conjunctiva/sclera: Conjunctivae normal.  ?Cardiovascular:  ?   Rate and Rhythm: Normal rate and regular rhythm.  ?   Heart sounds: Normal heart sounds. No murmur heard. ?Pulmonary:  ?   Effort: Pulmonary effort is normal. No respiratory distress.  ?   Breath sounds: Normal breath sounds. No wheezing or rales.  ?Musculoskeletal:     ?  General: Tenderness (chest wall tenderness in lower chest just left of sternum) present.  ?   Right lower leg: No edema.  ?   Left lower leg: No edema.  ?Skin: ?   General: Skin is warm and dry.  ?   Findings: No rash.  ?Neurological:  ?   Mental Status: He is alert. Mental status is at baseline.  ?Psychiatric:     ?   Mood and Affect: Mood normal.  ? ?   ? ?Lab Results  ?Component Value Date  ? WBC 4.9 05/04/2020  ? HGB 12.4 (L) 05/04/2020  ? HCT 39.8 05/04/2020  ? PLT 123 (L) 05/04/2020  ? GLUCOSE 101 (H) 05/04/2020  ? CHOL 118 12/24/2019  ? TRIG 91.0 12/24/2019  ? HDL 46.90 12/24/2019  ? Luther 53 12/24/2019  ? ALT 17 12/24/2019  ? AST 21 12/24/2019  ? NA 141 05/04/2020  ? K 3.9 05/04/2020  ? CL 106 05/04/2020  ? CREATININE 0.83 05/04/2020  ? BUN 17 05/04/2020  ? CO2 25 05/04/2020  ? TSH 3.23  10/31/2018  ? PSA 0.00 (L) 10/31/2018  ? INR 1.09 02/23/2017  ? HGBA1C 6.1 12/24/2019  ? ? ? ?Assessment & Plan:  ? ? ?See Problem List for Assessment and Plan of chronic medical problems.  ? ? ?

## 2021-06-01 NOTE — Patient Instructions (Addendum)
? ? ? ?  Blood work was ordered.   ? ? ?Medications changes include :   none ? ? ? ? ?Return in about 6 months (around 12/03/2021) for follow up. ? ?

## 2021-06-02 ENCOUNTER — Other Ambulatory Visit: Payer: Self-pay

## 2021-06-02 ENCOUNTER — Ambulatory Visit (INDEPENDENT_AMBULATORY_CARE_PROVIDER_SITE_OTHER): Payer: Medicare Other | Admitting: Internal Medicine

## 2021-06-02 VITALS — BP 134/74 | HR 89 | Temp 98.1°F | Ht 74.0 in | Wt 186.0 lb

## 2021-06-02 DIAGNOSIS — R7303 Prediabetes: Secondary | ICD-10-CM | POA: Diagnosis not present

## 2021-06-02 DIAGNOSIS — Z8673 Personal history of transient ischemic attack (TIA), and cerebral infarction without residual deficits: Secondary | ICD-10-CM

## 2021-06-02 DIAGNOSIS — E7849 Other hyperlipidemia: Secondary | ICD-10-CM | POA: Diagnosis not present

## 2021-06-02 DIAGNOSIS — R0789 Other chest pain: Secondary | ICD-10-CM | POA: Diagnosis not present

## 2021-06-02 LAB — COMPREHENSIVE METABOLIC PANEL
ALT: 21 U/L (ref 0–53)
AST: 27 U/L (ref 0–37)
Albumin: 4.7 g/dL (ref 3.5–5.2)
Alkaline Phosphatase: 55 U/L (ref 39–117)
BUN: 14 mg/dL (ref 6–23)
CO2: 32 mEq/L (ref 19–32)
Calcium: 9.6 mg/dL (ref 8.4–10.5)
Chloride: 105 mEq/L (ref 96–112)
Creatinine, Ser: 1.07 mg/dL (ref 0.40–1.50)
GFR: 61.95 mL/min (ref >60.00)
Glucose, Bld: 93 mg/dL (ref 70–99)
Potassium: 4 mEq/L (ref 3.5–5.1)
Sodium: 143 mEq/L (ref 135–145)
Total Bilirubin: 0.8 mg/dL (ref 0.2–1.2)
Total Protein: 6.9 g/dL (ref 6.0–8.3)

## 2021-06-02 LAB — LIPID PANEL
Cholesterol: 118 mg/dL (ref 0–200)
HDL: 50.4 mg/dL (ref >39.00)
LDL Cholesterol: 44 mg/dL (ref 0–99)
NonHDL: 67.22
Total CHOL/HDL Ratio: 2
Triglycerides: 117 mg/dL (ref 0.0–149.0)
VLDL: 23.4 mg/dL (ref 0.0–40.0)

## 2021-06-02 LAB — CBC WITH DIFFERENTIAL/PLATELET
Basophils Absolute: 0.1 10*3/uL (ref 0.0–0.1)
Basophils Relative: 1.8 % (ref 0.0–3.0)
Eosinophils Absolute: 0 10*3/uL (ref 0.0–0.7)
Eosinophils Relative: 0.9 % (ref 0.0–5.0)
HCT: 39.4 % (ref 39.0–52.0)
Hemoglobin: 13 g/dL (ref 13.0–17.0)
Lymphocytes Relative: 14.5 % (ref 12.0–46.0)
Lymphs Abs: 0.8 10*3/uL (ref 0.7–4.0)
MCHC: 32.9 g/dL (ref 30.0–36.0)
MCV: 88.1 fl (ref 78.0–100.0)
Monocytes Absolute: 0.9 10*3/uL (ref 0.1–1.0)
Monocytes Relative: 15.8 % — ABNORMAL HIGH (ref 3.0–12.0)
Neutro Abs: 3.6 10*3/uL (ref 1.4–7.7)
Neutrophils Relative %: 67 % (ref 43.0–77.0)
Platelets: 126 10*3/uL — ABNORMAL LOW (ref 150.0–400.0)
RBC: 4.48 Mil/uL (ref 4.22–5.81)
RDW: 16 % — ABNORMAL HIGH (ref 11.5–15.5)
WBC: 5.4 10*3/uL (ref 4.0–10.5)

## 2021-06-02 LAB — HEMOGLOBIN A1C: Hgb A1c MFr Bld: 5.9 % (ref 4.6–6.5)

## 2021-06-02 NOTE — Assessment & Plan Note (Signed)
Chronic ?continue plavix 75 mg and atorvastatin 40 mg daily ?Cmp, cbc, lipids ?

## 2021-06-02 NOTE — Assessment & Plan Note (Signed)
Chronic Check lipid panel  Continue atorvastatin 40 mg daily Regular exercise and healthy diet encouraged  

## 2021-06-02 NOTE — Assessment & Plan Note (Signed)
Chronic Check a1c Low sugar / carb diet Stressed regular exercise  

## 2021-06-02 NOTE — Assessment & Plan Note (Signed)
Acute ?Chest wall pain - likely hurt himself when doing his yardwork ?Has improved ?Symptomatic treatment ?

## 2021-06-03 ENCOUNTER — Ambulatory Visit: Payer: Medicare Other | Admitting: Internal Medicine

## 2021-06-10 ENCOUNTER — Ambulatory Visit: Payer: Medicare Other | Admitting: Internal Medicine

## 2021-06-24 ENCOUNTER — Telehealth: Payer: Self-pay

## 2021-06-24 MED ORDER — BENZONATATE 100 MG PO CAPS: 100.0000 mg | ORAL_CAPSULE | Freq: Three times a day (TID) | ORAL | 0 refills | Status: DC | PRN

## 2021-06-24 NOTE — Telephone Encounter (Signed)
Tessalon Perles sent in.

## 2021-06-24 NOTE — Telephone Encounter (Signed)
NEW NOTE NOT NEEDED °

## 2021-06-24 NOTE — Telephone Encounter (Signed)
[facility] asked that this be entered into pt chart... ? ?-Original Message----- ?From: [email or web address]> ?Sent: Friday, June 24, 2021 3:40 PM ?To: [email or web address]> ?Subject: E-1 Escalation ? ?*Caution - External email - see footer for warnings* ? ?Nurse Name: Zara Council RN ?Patient Name: Ronald Stafford ?Patient Phone: [phone] ?DOB: 08/17/1930 ?Synopsis of Call: ?I spoke with the patient and he reports he has chest congestion with productive cough of dark yellow sputum since Tuesday ( 06/21/2021).  He denies any difficulty breathing but does have sinus pain in his head and nose.  He reports chest pain only when coughing.  He states his temperature is 99.  3. ? ?Protocol Used: Common Cold (Adult) ?Protocol-Based Disposition: Home Care ? ?Positive Triage Question: ?* Common cold with no complications ?* All higher-acuity triage questions were negative ? ?Care Advice Discussed: ?* Reassurance and Education - Common Cold Symptoms ?* For a [place] Your Nose ?* Nasal Washes for a Stuffy Nose ?* Nasal Washes - Step-By-Step Instructions ?* How to Make Saline [facility] Water) Nasal Wash ?* Medicines for Stuffy or Runny Nose ?* Nasal Decongestants for a Very Stuffy Nose ?* Nasal Decongestants - Extra Notes and Warnings ?* Treatment for Other Cold Symptoms ?* Humidifier ?* Contagiousness ?* Expected Course ?* Reasons To Call Back ? - Fever lasts over 3 days ? - Runny nose lasts over 10 days ? - You become short of breath ? - You become worse ? ?

## 2021-06-24 NOTE — Telephone Encounter (Signed)
Pt is calling requesting some medication for a cold. Pt reports it started last Tuesday.  ? ?I offered pt an appt his replied was "Can I not just talk to the nurse to see if they will call me in something for this cold."  ? ?Please advise ?

## 2021-07-03 ENCOUNTER — Encounter: Payer: Self-pay | Admitting: Internal Medicine

## 2021-07-03 NOTE — Progress Notes (Signed)
? ? ?Subjective:  ? ? Patient ID: Ronald Stafford, male    DOB: 11/11/1930, 88 y.o.   MRN: [id number] ? ?This visit occurred during the SARS-CoV-2 public health emergency.  Safety protocols were in place, including screening questions prior to the visit, additional usage of staff PPE, and extensive cleaning of exam room while observing appropriate contact time as indicated for disinfecting solutions. ? ? ? ?HPI ?Ronald Stafford is here for  ?Chief Complaint  ?Patient presents with  ? Cough  ?  Cough x 2 weeks (productive cough)  ? ? ? ?Chest pain, cold symptoms -  he has had cold symptoms for two weeks.  Right now he is experiencing sore throat, nasal congestion, PND, sinus pressure, headaches, dizziness. He has a cough that is worse in the morning.  ? ? ?Taking mucinex DM and nasal spray ? ?His symptoms are getting worse in the past few days.  ? ? ? ?Medications and allergies reviewed with patient and updated if appropriate. ? ?Current Outpatient Medications on File Prior to Visit  ?Medication Sig Dispense Refill  ? acetaminophen (TYLENOL) 325 MG tablet Take 650 mg by mouth every 6 (six) hours as needed for moderate pain or headache.    ? atorvastatin (LIPITOR) 40 MG tablet TAKE ONE TABLET BY MOUTH EVERY MORNING 90 tablet 1  ? clopidogrel (PLAVIX) 75 MG tablet TAKE ONE TABLET BY MOUTH EVERY MORNING 90 tablet 5  ? Omega-3 Fatty Acids (FISH OIL) 500 MG CAPS Take 1 capsule by mouth daily.    ? vitamin C (ASCORBIC ACID) 250 MG tablet Take 250 mg by mouth daily.    ? benzonatate (TESSALON) 100 MG capsule Take 1 capsule (100 mg total) by mouth 3 (three) times daily as needed for cough. (Patient not taking: Reported on 07/04/2021) 30 capsule 0  ? mirabegron ER (MYRBETRIQ) 25 MG TB24 tablet Take 1 tablet (25 mg total) by mouth daily as needed. (Patient not taking: Reported on 05/17/2021) 30 tablet   ? ?No current facility-administered medications on file prior to visit.  ? ? ?Review of Systems  ?Constitutional:  Negative for fever.  ?HENT:   Positive for congestion, postnasal drip, sinus pressure and sore throat. Negative for ear pain.   ?Respiratory:  Positive for cough (from PND). Negative for shortness of breath and wheezing.   ?Neurological:  Positive for dizziness and headaches.  ? ?   ?Objective:  ? ?Vitals:  ? 07/04/21 0832  ?BP: (!) 148/82  ?Pulse: 80  ?Temp: 98.2 ?F (36.8 ?C)  ?SpO2: 97%  ? ?BP Readings from Last 3 Encounters:  ?07/04/21 (!) 148/82  ?06/02/21 134/74  ?04/25/21 132/77  ? ?Wt Readings from Last 3 Encounters:  ?07/04/21 184 lb (83.5 kg)  ?06/02/21 186 lb (84.4 kg)  ?04/18/21 185 lb 3.2 oz (84 kg)  ? ?Body mass index is 23.62 kg/m?. ? ?  ?Physical Exam ?Constitutional:   ?   General: He is not in acute distress. ?   Appearance: Normal appearance. He is not ill-appearing.  ?HENT:  ?   Head: Normocephalic.  ?   Right Ear: Ear canal and external ear normal. There is impacted cerumen.  ?   Left Ear: Ear canal and external ear normal. There is impacted cerumen.  ?   Mouth/Throat:  ?   Mouth: Mucous membranes are moist.  ?   Pharynx: No oropharyngeal exudate or posterior oropharyngeal erythema.  ?Eyes:  ?   Conjunctiva/sclera: Conjunctivae normal.  ?Cardiovascular:  ?   Rate and  Rhythm: Normal rate and regular rhythm.  ?Pulmonary:  ?   Effort: Pulmonary effort is normal. No respiratory distress.  ?   Breath sounds: Normal breath sounds. No wheezing or rales.  ?Musculoskeletal:  ?   Cervical back: Neck supple. No tenderness.  ?Lymphadenopathy:  ?   Cervical: No cervical adenopathy.  ?Skin: ?   General: Skin is warm and dry.  ?   Findings: No rash.  ?Neurological:  ?   Mental Status: He is alert.  ? ?   ? ? ? ? ? ?Assessment & Plan:  ? ? ?See Problem List for Assessment and Plan of chronic medical problems.  ? ? ? ? ?

## 2021-07-04 ENCOUNTER — Ambulatory Visit (INDEPENDENT_AMBULATORY_CARE_PROVIDER_SITE_OTHER): Payer: Medicare Other | Admitting: Internal Medicine

## 2021-07-04 VITALS — BP 140/80 | HR 80 | Temp 98.2°F | Ht 74.0 in | Wt 184.0 lb

## 2021-07-04 DIAGNOSIS — R918 Other nonspecific abnormal finding of lung field: Secondary | ICD-10-CM

## 2021-07-04 DIAGNOSIS — J069 Acute upper respiratory infection, unspecified: Secondary | ICD-10-CM | POA: Diagnosis not present

## 2021-07-04 MED ORDER — MIRABEGRON ER 25 MG PO TB24: 25.0000 mg | ORAL_TABLET | Freq: Every day | ORAL | 1 refills | Status: DC | PRN

## 2021-07-04 MED ORDER — AMOXICILLIN-POT CLAVULANATE 875-125 MG PO TABS: 1.0000 | ORAL_TABLET | Freq: Two times a day (BID) | ORAL | 0 refills | Status: AC

## 2021-07-04 NOTE — Assessment & Plan Note (Signed)
Acute ?Symptoms for over 2 weeks and they seem to be getting worse ?This is concerning for possible bacterial cause given duration and current symptoms ?Start Augmentin 875-125 mg twice daily for 7 days ?Continue Mucinex, nasal spray, Nettie pot ?Advised to call if symptoms do not improve/resolve ?

## 2021-07-04 NOTE — Patient Instructions (Addendum)
? ? ? ? ? ?  Medications changes include :   Augmentin twice daily for one week ? ? ?Your prescription(s) have been sent to your pharmacy.  ? ? ?A Ct scan of your lungs was ordered to follow up the nodules.   Someone will call you to schedule an appointment.  ? ? ?Return if symptoms worsen or fail to improve. ? ?

## 2021-07-14 NOTE — Progress Notes (Signed)
? ? ?Subjective:  ? ? Patient ID: Ronald Stafford, male    DOB: [birth year], 88 y.o.   MRN: [id number] ? ?This visit occurred during the SARS-CoV-2 public health emergency.  Safety protocols were in place, including screening questions prior to the visit, additional usage of staff PPE, and extensive cleaning of exam room while observing appropriate contact time as indicated for disinfecting solutions. ? ? ? ?HPI ?Ronald Stafford is here for  ?Chief Complaint  ?Patient presents with  ? Fall  ?  Fall on Wednesday and Thursday (patient fell backwards but didn't hit his head)  ? ? ?He fell at home and is having left shoulder and hand pain.  He fell twice this past week in his yard. The first fall he hurt his sholder and the second fall he cut his hand.   ? ? ?Left shoulder - it hurt bad yesterday it is not too bad now.  He is taking tylneol.    He can move it good, but it pops at certain positions.   The pain is better than it was yesterday. ? ?Hand pain - left hand skin tear - no pain now.  ? ?He continues to have falls, especially when he is working outside knee bends over when he stands up.  He tends to go backwards.  He feels off balance or swimmy headed, but no true spinning sensation.  He feels like in the past he may have tried a medication that helped, but is not sure what it was. ? ?Medications and allergies reviewed with patient and updated if appropriate. ? ?Current Outpatient Medications on File Prior to Visit  ?Medication Sig Dispense Refill  ? acetaminophen (TYLENOL) 325 MG tablet Take 650 mg by mouth every 6 (six) hours as needed for moderate pain or headache.    ? atorvastatin (LIPITOR) 40 MG tablet TAKE ONE TABLET BY MOUTH EVERY MORNING 90 tablet 1  ? clopidogrel (PLAVIX) 75 MG tablet TAKE ONE TABLET BY MOUTH EVERY MORNING 90 tablet 5  ? mirabegron ER (MYRBETRIQ) 25 MG TB24 tablet Take 1 tablet (25 mg total) by mouth daily as needed. 90 tablet 1  ? Omega-3 Fatty Acids (FISH OIL) 500 MG CAPS Take 1 capsule by mouth  daily.    ? vitamin C (ASCORBIC ACID) 250 MG tablet Take 250 mg by mouth daily.    ? ?No current facility-administered medications on file prior to visit.  ? ? ?Review of Systems ? ?   ?Objective:  ? ?Vitals:  ? 07/15/21 1027 07/15/21 1045  ?BP: (!) 160/92 (!) 148/80  ?Pulse: 92   ?Temp: 98.5 ?F (36.9 ?C)   ?SpO2: 99%   ? ?BP Readings from Last 3 Encounters:  ?07/15/21 (!) 148/80  ?07/04/21 140/80  ?06/02/21 134/74  ? ?Wt Readings from Last 3 Encounters:  ?07/15/21 183 lb 12.8 oz (83.4 kg)  ?07/04/21 184 lb (83.5 kg)  ?06/02/21 186 lb (84.4 kg)  ? ?Body mass index is 23.6 kg/m?. ? ?  ?Physical Exam ?Constitutional:   ?   General: He is not in acute distress. ?   Appearance: Normal appearance.  ?HENT:  ?   Head: Normocephalic and atraumatic.  ?Musculoskeletal:  ?   Comments: No tenderness with palpation of shoulder joint or left upper arm.  He is able to use the arm and has slightly decreased range of motion.  He does have pain when lifting his arm up and feels a popping sensation.  ?Skin: ?   General: Skin is warm  and dry.  ?   Comments: Small skin tear left thenar process without surrounding erythema, swelling, bleeding or discharge  ?Neurological:  ?   Mental Status: He is alert.  ? ?   ? ? ? ? ? ?Assessment & Plan:  ? ? ?See Problem List for Assessment and Plan of chronic medical problems.  ? ? ? ? ?

## 2021-07-15 ENCOUNTER — Encounter: Payer: Self-pay | Admitting: Internal Medicine

## 2021-07-15 ENCOUNTER — Ambulatory Visit (INDEPENDENT_AMBULATORY_CARE_PROVIDER_SITE_OTHER): Payer: Medicare Other | Admitting: Internal Medicine

## 2021-07-15 DIAGNOSIS — M25512 Pain in left shoulder: Secondary | ICD-10-CM | POA: Diagnosis not present

## 2021-07-15 DIAGNOSIS — R42 Dizziness and giddiness: Secondary | ICD-10-CM

## 2021-07-15 DIAGNOSIS — S61419A Laceration without foreign body of unspecified hand, initial encounter: Secondary | ICD-10-CM | POA: Diagnosis not present

## 2021-07-15 MED ORDER — MECLIZINE HCL 12.5 MG PO TABS: 12.5000 mg | ORAL_TABLET | Freq: Three times a day (TID) | ORAL | 0 refills | Status: DC | PRN

## 2021-07-15 NOTE — Assessment & Plan Note (Signed)
Acute ?Pain in left shoulder as a result of a fall in his yard this week ?Pain has improved ?He has fairly good range of motion-slightly decreased ?He has a popping sensation when he lifts his arm ?Taking Tylenol ?He deferred further evaluation at this time ?If pain persists he will let me know so that I can refer him ?

## 2021-07-15 NOTE — Assessment & Plan Note (Signed)
Acute ?Mild skin tear left hand on Thenar process that occurred this week when he fell in the yard ?No evidence of infection ?Continue local wound care ?

## 2021-07-15 NOTE — Assessment & Plan Note (Signed)
Chronic, intermittent ?He states some sort of dizziness/lightheadedness/unbalanced feeling when he stands up, especially after bending over when working in the yard ?He has had several falls ?Do not think he is having orthostatic hypotension ?Possible dizziness-he thinks he did take a pill in the past and that seemed to help ?Trial of meclizine 12.5 mg 3 times daily as needed ?Discussed that this medication can cause drowsiness.  If he has any side effects or it is not effective he will stop using it ?Discussed being careful when changing positions and with head movements ?

## 2021-07-15 NOTE — Patient Instructions (Addendum)
? ? ? ? ? ?  Medications changes include :   meclizine 12.5 mg three times a day as needed for dizziness ? ? ? ? ?Your prescription(s) have been sent to your pharmacy.  ? ? ?

## 2021-07-25 ENCOUNTER — Ambulatory Visit (INDEPENDENT_AMBULATORY_CARE_PROVIDER_SITE_OTHER)
Admission: RE | Admit: 2021-07-25 | Discharge: 2021-07-25 | Disposition: A | Discharge status: Home / Self Care | Payer: Medicare Other | Source: Ambulatory Visit | Attending: Internal Medicine | Admitting: Internal Medicine

## 2021-07-25 DIAGNOSIS — R918 Other nonspecific abnormal finding of lung field: Secondary | ICD-10-CM

## 2021-07-25 DIAGNOSIS — R911 Solitary pulmonary nodule: Secondary | ICD-10-CM | POA: Diagnosis not present

## 2021-07-25 DIAGNOSIS — J439 Emphysema, unspecified: Secondary | ICD-10-CM | POA: Diagnosis not present

## 2021-07-29 ENCOUNTER — Other Ambulatory Visit: Payer: Self-pay

## 2021-07-29 MED ORDER — MECLIZINE HCL 12.5 MG PO TABS: 12.5000 mg | ORAL_TABLET | Freq: Three times a day (TID) | ORAL | 0 refills | Status: DC | PRN

## 2021-08-26 ENCOUNTER — Ambulatory Visit (INDEPENDENT_AMBULATORY_CARE_PROVIDER_SITE_OTHER): Payer: Medicare Other

## 2021-08-26 DIAGNOSIS — Z0000 Encounter for general adult medical examination without abnormal findings: Secondary | ICD-10-CM | POA: Diagnosis not present

## 2021-08-26 NOTE — Patient Instructions (Signed)
[name] , Thank you for taking time to come for your Medicare Wellness Visit. I appreciate your ongoing commitment to your health goals. Please review the following plan we discussed and let me know if I can assist you in the future.   Screening recommendations/referrals: Colonoscopy: no longer required  Recommended yearly ophthalmology/optometry visit for glaucoma screening and checkup Recommended yearly dental visit for hygiene and checkup  Vaccinations: Influenza vaccine: completed  Pneumococcal vaccine: completed  Tdap vaccine: 04/04/2016 Shingles vaccine: will consider     Advanced directives: yes   Conditions/risks identified: none   Next appointment: none   Preventive Care 2 Years and Older, Male Preventive care refers to lifestyle choices and visits with your health care provider that can promote health and wellness. What does preventive care include? A yearly physical exam. This is also called an annual well check. Dental exams once or twice a year. Routine eye exams. Ask your health care provider how often you should have your eyes checked. Personal lifestyle choices, including: Daily care of your teeth and gums. Regular physical activity. Eating a healthy diet. Avoiding tobacco and drug use. Limiting alcohol use. Practicing safe sex. Taking low doses of aspirin every day. Taking vitamin and mineral supplements as recommended by your health care provider. What happens during an annual well check? The services and screenings done by your health care provider during your annual well check will depend on your age, overall health, lifestyle risk factors, and family history of disease. Counseling  Your health care provider may ask you questions about your: Alcohol use. Tobacco use. Drug use. Emotional well-being. Home and relationship well-being. Sexual activity. Eating habits. History of falls. Memory and ability to understand (cognition). Work and work  Statistician. Screening  You may have the following tests or measurements: Height, weight, and BMI. Blood pressure. Lipid and cholesterol levels. These may be checked every 5 years, or more frequently if you are over 58 years old. Skin check. Lung cancer screening. You may have this screening every year starting at age 62 if you have a 30-pack-year history of smoking and currently smoke or have quit within the past 15 years. Fecal occult blood test (FOBT) of the stool. You may have this test every year starting at age 12. Flexible sigmoidoscopy or colonoscopy. You may have a sigmoidoscopy every 5 years or a colonoscopy every 10 years starting at age 50. Prostate cancer screening. Recommendations will vary depending on your family history and other risks. Hepatitis C blood test. Hepatitis B blood test. Sexually transmitted disease (STD) testing. Diabetes screening. This is done by checking your blood sugar (glucose) after you have not eaten for a while (fasting). You may have this done every 1-3 years. Abdominal aortic aneurysm (AAA) screening. You may need this if you are a current or former smoker. Osteoporosis. You may be screened starting at age 61 if you are at high risk. Talk with your health care provider about your test results, treatment options, and if necessary, the need for more tests. Vaccines  Your health care provider may recommend certain vaccines, such as: Influenza vaccine. This is recommended every year. Tetanus, diphtheria, and acellular pertussis (Tdap, Td) vaccine. You may need a Td booster every 10 years. Zoster vaccine. You may need this after age 35. Pneumococcal 13-valent conjugate (PCV13) vaccine. One dose is recommended after age 50. Pneumococcal polysaccharide (PPSV23) vaccine. One dose is recommended after age 92. Talk to your health care provider about which screenings and vaccines you need and  how often you need them. This information is not intended to replace  advice given to you by your health care provider. Make sure you discuss any questions you have with your health care provider. Document Released: 03/26/2015 Document Revised: 11/17/2015 Document Reviewed: 12/29/2014 Elsevier Interactive Patient Education  2017 [place] Prevention in the Home Falls can cause injuries. They can happen to people of all ages. There are many things you can do to make your home safe and to help prevent falls. What can I do on the outside of my home? Regularly fix the edges of walkways and driveways and fix any cracks. Remove anything that might make you trip as you walk through a door, such as a raised step or threshold. Trim any bushes or trees on the path to your home. Use bright outdoor lighting. Clear any walking paths of anything that might make someone trip, such as rocks or tools. Regularly check to see if handrails are loose or broken. Make sure that both sides of any steps have handrails. Any raised decks and porches should have guardrails on the edges. Have any leaves, snow, or ice cleared regularly. Use sand or salt on walking paths during winter. Clean up any spills in your garage right away. This includes oil or grease spills. What can I do in the bathroom? Use night lights. Install grab bars by the toilet and in the tub and shower. Do not use towel bars as grab bars. Use non-skid mats or decals in the tub or shower. If you need to sit down in the shower, use a plastic, non-slip stool. Keep the floor dry. Clean up any water that spills on the floor as soon as it happens. Remove soap buildup in the tub or shower regularly. Attach bath mats securely with double-sided non-slip rug tape. Do not have throw rugs and other things on the floor that can make you trip. What can I do in the bedroom? Use night lights. Make sure that you have a light by your bed that is easy to reach. Do not use any sheets or blankets that are too big for your bed.  They should not hang down onto the floor. Have a firm chair that has side arms. You can use this for support while you get dressed. Do not have throw rugs and other things on the floor that can make you trip. What can I do in the kitchen? Clean up any spills right away. Avoid walking on wet floors. Keep items that you use a lot in easy-to-reach places. If you need to reach something above you, use a strong step stool that has a grab bar. Keep electrical cords out of the way. Do not use floor polish or wax that makes floors slippery. If you must use wax, use non-skid floor wax. Do not have throw rugs and other things on the floor that can make you trip. What can I do with my stairs? Do not leave any items on the stairs. Make sure that there are handrails on both sides of the stairs and use them. Fix handrails that are broken or loose. Make sure that handrails are as long as the stairways. Check any carpeting to make sure that it is firmly attached to the stairs. Fix any carpet that is loose or worn. Avoid having throw rugs at the top or bottom of the stairs. If you do have throw rugs, attach them to the floor with carpet tape. Make sure that you have  a light switch at the top of the stairs and the bottom of the stairs. If you do not have them, ask someone to add them for you. What else can I do to help prevent falls? Wear shoes that: Do not have high heels. Have rubber bottoms. Are comfortable and fit you well. Are closed at the toe. Do not wear sandals. If you use a stepladder: Make sure that it is fully opened. Do not climb a closed stepladder. Make sure that both sides of the stepladder are locked into place. Ask someone to hold it for you, if possible. Clearly mark and make sure that you can see: Any grab bars or handrails. First and last steps. Where the edge of each step is. Use tools that help you move around (mobility aids) if they are needed. These  include: Canes. Walkers. Scooters. Crutches. Turn on the lights when you go into a dark area. Replace any light bulbs as soon as they burn out. Set up your furniture so you have a clear path. Avoid moving your furniture around. If any of your floors are uneven, fix them. If there are any pets around you, be aware of where they are. Review your medicines with your doctor. Some medicines can make you feel dizzy. This can increase your chance of falling. Ask your doctor what other things that you can do to help prevent falls. This information is not intended to replace advice given to you by your health care provider. Make sure you discuss any questions you have with your health care provider. Document Released: 12/24/2008 Document Revised: 08/05/2015 Document Reviewed: 04/03/2014 Elsevier Interactive Patient Education  2017 [employer].

## 2021-08-26 NOTE — Progress Notes (Signed)
Subjective:   Ronald Stafford is a 88 y.o. male who presents for an Subsequent Medicare Annual Wellness Visit.   I connected with Ronald Stafford  today by telephone and verified that I am speaking with the correct person using two identifiers. Location patient: home Location provider: work Persons participating in the virtual visit: patient, provider.   I discussed the limitations, risks, security and privacy concerns of performing an evaluation and management service by telephone and the availability of in person appointments. I also discussed with the patient that there may be a patient responsible charge related to this service. The patient expressed understanding and verbally consented to this telephonic visit.    Interactive audio and video telecommunications were attempted between this provider and patient, however failed, due to patient having technical difficulties OR patient did not have access to video capability.  We continued and completed visit with audio only.    Review of Systems    Cardiac Risk Factors include: advanced age (>11mn, >>70women);dyslipidemia     Objective:    Today's Vitals   There is no height or weight on file to calculate BMI.     08/26/2021   11:21 AM 08/17/2020   11:24 AM 03/25/2019    8:22 AM 04/18/2018    9:23 AM 02/22/2017    5:58 PM 02/22/2017    3:06 PM 02/14/2017    4:13 PM  Advanced Directives  Does Patient Have a Medical Advance Directive? Yes Yes No Yes Yes Yes Yes  Type of AParamedicof [place]askinLiving will Living will  [place]Living will HSt. Clair ShoresLiving will [place]adfordLiving will HOakesLiving will  Does patient want to make changes to medical advance directive?  No - Patient declined   No - Patient declined  No - Patient declined  Copy of [place] Chart? No - copy requested   No - copy requested No - copy requested  No - copy requested Yes  Would patient like information on creating a medical advance directive?   No - Patient declined        Current Medications (verified) Outpatient Encounter Medications as of 08/26/2021  Medication Sig   acetaminophen (TYLENOL) 325 MG tablet Take 650 mg by mouth every 6 (six) hours as needed for moderate pain or headache.   atorvastatin (LIPITOR) 40 MG tablet TAKE ONE TABLET BY MOUTH EVERY MORNING   clopidogrel (PLAVIX) 75 MG tablet TAKE ONE TABLET BY MOUTH EVERY MORNING   meclizine (ANTIVERT) 12.5 MG tablet Take 1 tablet (12.5 mg total) by mouth 3 (three) times daily as needed for dizziness.   mirabegron ER (MYRBETRIQ) 25 MG TB24 tablet Take 1 tablet (25 mg total) by mouth daily as needed.   Omega-3 Fatty Acids (FISH OIL) 500 MG CAPS Take 1 capsule by mouth daily.   vitamin C (ASCORBIC ACID) 250 MG tablet Take 250 mg by mouth daily.   No facility-administered encounter medications on file as of 08/26/2021.    Allergies (verified) Chicken allergy   History: Past Medical History:  Diagnosis Date   [place]   prostate cancer, Dr. GRisa Grill  Colon polyps    Deep venous thrombosis ([place] 1997   Postop   Depression    GERD (gastroesophageal reflux disease)    Hyperlipidemia    Hypertension    Radial head fracture, closed 09/18/2013   Shoulder fracture, left 02/23/2011   immobilized in sling ,  Dr Norris(no surgery)   Past Surgical History:  Procedure Laterality Date   COLONOSCOPY  2006   Negative,Dr.Edwards   colonoscopy with polypectomy      PMH of   ESOPHAGOGASTRODUODENOSCOPY N/A 02/23/2017   Procedure: ESOPHAGOGASTRODUODENOSCOPY (EGD);  Surgeon: Otis Brace, MD;  Location: [facility];  Service: Gastroenterology;  Laterality: N/A;   JOINT REPLACEMENT  1997, 2001   Total Hip replacement X 2   LUMBAR SPINE SURGERY  09/28/2010   Dr Rolena Infante; for spinal stenosis   MECKEL DIVERTICULUM EXCISION     Age 66   PROSTATECTOMY  2004   w/ radiation,  Dr Risa Grill   TOTAL HIP REVISION Left 02/14/2017   Procedure: Left hip polyethylene EXCHANGE;  Surgeon: Gaynelle Arabian, MD;  Location: [facility];  Service: Orthopedics;  Laterality: Left;   VASECTOMY     Family History  Problem Relation Age of Onset   Prostate cancer Father    Testicular cancer Son    Social History   Socioeconomic History   Marital status: Widowed    Spouse name: Not on file   Number of children: 2   Years of education: Not on file   Highest education level: Not on file  Occupational History   Occupation: retired  Tobacco Use   Smoking status: Former    Packs/day: 0.50    Years: 5.00    Total pack years: 2.50    Types: Cigarettes    Quit date: 03/13/1988    Years since quitting: 33.4   Smokeless tobacco: Former   Tobacco comments:    Quit at about age 54 / chewed tobacca x 1 year  Vaping Use   Vaping Use: Never used  Substance and Sexual Activity   Alcohol use: No    Alcohol/week: 0.0 standard drinks of alcohol   Drug use: No   Sexual activity: Never  Other Topics Concern   Not on file  Social History Narrative   Lives alone; Wife passed 12-26-2012;   Died of ovarian cancer; breast cancer; other cancer;      Social Determinants of Health   Financial Resource Strain: Low Risk  (08/26/2021)   Overall Financial Resource Strain (CARDIA)    Difficulty of Paying Living Expenses: Not hard at all  Food Insecurity: No Food Insecurity (08/26/2021)   Hunger Vital Sign    Worried About Running Out of Food in the Last Year: Never true    Ran Out of Food in the Last Year: Never true  Transportation Needs: No Transportation Needs (08/26/2021)   PRAPARE - Hydrologist (Medical): No    Lack of Transportation (Non-Medical): No  Physical Activity: Insufficiently Active (08/26/2021)   Exercise Vital Sign    Days of Exercise per Week: 3 days    Minutes of Exercise per Session: 30 min  Stress: No Stress Concern Present (08/26/2021)    [place]    Feeling of Stress : Not at all  Social Connections: Moderately Isolated (08/26/2021)   Social Connection and Isolation Panel [NHANES]    Frequency of Communication with Friends and Family: Three times a week    Frequency of Social Gatherings with Friends and Family: Three times a week    Attends Religious Services: More than 4 times per year    Active Member of Clubs or Organizations: No    Attends Archivist Meetings: Never    Marital Status: Widowed    Tobacco Counseling  Counseling given: Not Answered Tobacco comments: Quit at about age 82 / chewed tobacca x 1 year   Clinical Intake:  Pre-visit preparation completed: Yes  Pain : No/denies pain     Nutritional Risks: None Diabetes: No  How often do you need to have someone help you when you read instructions, pamphlets, or other written materials from your doctor or pharmacy?: 1 - Never What is the last grade level you completed in school?: High School  Diabetic?no   Interpreter Needed?: No  Information entered by :: L.Gift Rueckert,LPN   Activities of Daily Living    08/26/2021   11:28 AM  In your present state of health, do you have any difficulty performing the following activities:  Hearing? 0  Vision? 0  Difficulty concentrating or making decisions? 0  Walking or climbing stairs? 0  Dressing or bathing? 0  Doing errands, shopping? 0  Preparing Food and eating ? N  Using the Toilet? N  In the past six months, have you accidently leaked urine? N  Do you have problems with loss of bowel control? N  Managing your Medications? N  Managing your Finances? N  Housekeeping or managing your Housekeeping? N    Patient Care Team: Binnie Rail, MD as PCP - General (Internal Medicine) Rana Snare, MD (Inactive) as Consulting Physician (Urology) Marybelle Killings, MD as Consulting Physician (Orthopedic Surgery) Charlton Haws,  [facility] as Pharmacist (Pharmacist) Early, Arvilla Meres, MD as Consulting Physician (Vascular Surgery)  Indicate any recent Medical Services you may have received from other than [facility] providers in the past year (date may be approximate).     Assessment:   This is a routine wellness examination for [place].  Hearing/Vision screen Vision Screening - Comments:: Annual eye exams wear glasses  Dietary issues and exercise activities discussed: Current Exercise Habits: Home exercise routine, Type of exercise: treadmill;strength training/weights, Time (Minutes): 30, Frequency (Times/Week): 3, Weekly Exercise (Minutes/Week): 90, Intensity: Mild, Exercise limited by: None identified   Goals Addressed               This Visit's Progress     <enter goal here> (pt-stated)   On track     To continue to enjoy life/ Discussed walking everyday would help increase HDL        Depression Screen    08/26/2021   11:28 AM 08/26/2021   11:21 AM 08/26/2021   11:19 AM 07/04/2021   10:42 AM 08/17/2020   11:45 AM 06/18/2019    8:32 AM 04/18/2018    9:23 AM  PHQ 2/9 Scores  PHQ - 2 Score 0 0 0 0 0 0 0    Fall Risk    08/26/2021   11:22 AM 07/04/2021   10:42 AM 08/17/2020   11:16 AM 06/18/2019    8:31 AM 04/18/2018    9:23 AM  Fall Risk   Falls in the past year? '1 1 1 1 1  '$ Number falls in past yr: 0 1 0 1 0  Injury with Fall? 0 0 1 0 0  Risk for fall due to : Impaired balance/gait No Fall Risks  Impaired balance/gait;Orthopedic patient   Follow up Falls evaluation completed;Education provided Falls evaluation completed Falls evaluation completed Falls evaluation completed;Education provided;Falls prevention discussed Falls prevention discussed;Education provided    FALL RISK PREVENTION PERTAINING TO THE HOME:  Any stairs in or around the home? Yes  If so, are there any without handrails? No  Home free of loose throw rugs in walkways,  pet beds, electrical cords, etc? Yes  Adequate lighting in your home to reduce  risk of falls? Yes   ASSISTIVE DEVICES UTILIZED TO PREVENT FALLS:  Life alert? No  Use of a cane, walker or w/c? Yes  Grab bars in the bathroom? Yes  Shower chair or bench in shower? Yes  Elevated toilet seat or a handicapped toilet? Yes     Cognitive Function:Normal cognitive status assessed by telephone conversation  by this Nurse Health Advisor. No abnormalities found.      07/14/2014   10:13 AM  MMSE - Mini Mental State Exam  Not completed: Unable to complete        06/18/2019    8:33 AM  6CIT Screen  What Year? 0 points  What month? 0 points  What time? 0 points  Count back from 20 2 points  Months in reverse 2 points  Repeat phrase 0 points  Total Score 4 points    Immunizations Immunization History  Administered Date(s) Administered   Fluad Quad(high Dose 65+) 10/31/2018, 12/24/2019   Influenza Split 12/20/2010, 12/13/2011   Influenza Whole 12/06/2007, 01/05/2009, 12/06/2009   Influenza, High Dose Seasonal PF 12/18/2013, 12/07/2014, 12/17/2015, 12/14/2017   Influenza,inj,Quad PF,6+ Mos 01/01/2013   Influenza,inj,quad, With Preservative 12/11/2016   Influenza-Unspecified 11/27/2016, 11/18/2020   PFIZER(Purple Top)SARS-COV-2 Vaccination 04/03/2019, 04/24/2019, 12/26/2019, 07/23/2020   Pneumococcal Conjugate-13 07/14/2014   Pneumococcal Polysaccharide-23 02/28/2013   Tdap 06/06/2005, 04/04/2016   Zoster, Live 03/01/2011    TDAP status: Up to date  Flu Vaccine status: Up to date  Pneumococcal vaccine status: Up to date  Covid-19 vaccine status: Completed vaccines  Qualifies for Shingles Vaccine? Yes   Zostavax completed No   Shingrix Completed?: No.    Education has been provided regarding the importance of this vaccine. Patient has been advised to call insurance company to determine out of pocket expense if they have not yet received this vaccine. Advised may also receive vaccine at local pharmacy or Health Dept. Verbalized acceptance and  understanding.  Screening Tests Health Maintenance  Topic Date Due   Zoster Vaccines- Shingrix (1 of 2) Never done   COVID-19 Vaccine (5 - Pfizer series) 09/17/2020   INFLUENZA VACCINE  10/11/2021   TETANUS/TDAP  04/04/2026   Pneumonia Vaccine 48+ Years old  Completed   HPV VACCINES  Aged Out    Health Maintenance  Health Maintenance Due  Topic Date Due   Zoster Vaccines- Shingrix (1 of 2) Never done   COVID-19 Vaccine (5 - Pfizer series) 09/17/2020    Colorectal cancer screening: No longer required.   Lung Cancer Screening: (Low Dose CT Chest recommended if Age 50-80 years, 30 pack-year currently smoking OR have quit w/in 15years.) does not qualify.   Lung Cancer Screening Referral: n/a  Additional Screening:  Hepatitis C Screening: does not qualify;   Vision Screening: Recommended annual ophthalmology exams for early detection of glaucoma and other disorders of the eye. Is the patient up to date with their annual eye exam?  Yes  Who is the provider or what is the name of the office in which the patient attends annual eye exams? Unknown  If pt is not established with a provider, would they like to be referred to a provider to establish care? No .   Dental Screening: Recommended annual dental exams for proper oral hygiene  Community Resource Referral / Chronic Care Management: CRR required this visit?  No   CCM required this visit?  No  Plan:     I have personally reviewed and noted the following in the patient's chart:   Medical and social history Use of alcohol, tobacco or illicit drugs  Current medications and supplements including opioid prescriptions. Patient is not currently taking opioid prescriptions. Functional ability and status Nutritional status Physical activity Advanced directives List of other physicians Hospitalizations, surgeries, and ER visits in previous 12 months Vitals Screenings to include cognitive, depression, and  falls Referrals and appointments  In addition, I have reviewed and discussed with patient certain preventive protocols, quality metrics, and best practice recommendations. A written personalized care plan for preventive services as well as general preventive health recommendations were provided to patient.     Randel Pigg, LPN   09/21/5269   Nurse Notes: none

## 2021-09-30 DIAGNOSIS — H6122 Impacted cerumen, left ear: Secondary | ICD-10-CM | POA: Diagnosis not present

## 2021-11-12 ENCOUNTER — Other Ambulatory Visit: Payer: Self-pay | Admitting: Internal Medicine

## 2021-12-20 ENCOUNTER — Telehealth: Payer: Self-pay

## 2021-12-20 ENCOUNTER — Encounter: Payer: Self-pay | Admitting: Internal Medicine

## 2021-12-20 ENCOUNTER — Ambulatory Visit (INDEPENDENT_AMBULATORY_CARE_PROVIDER_SITE_OTHER): Payer: Medicare Other | Admitting: Internal Medicine

## 2021-12-20 VITALS — BP 120/84 | HR 103 | Temp 97.8°F | Ht 74.0 in | Wt 179.5 lb

## 2021-12-20 DIAGNOSIS — R0789 Other chest pain: Secondary | ICD-10-CM | POA: Diagnosis not present

## 2021-12-20 MED ORDER — PREDNISONE 20 MG PO TABS: 40.0000 mg | ORAL_TABLET | Freq: Every day | ORAL | 0 refills | Status: DC

## 2021-12-20 NOTE — Patient Instructions (Signed)
We have sent in prednisone to take 2 pills daily for 5 days.   If this does not help the shoulder let us know. We will get you in with sports medicine for a shot in the shoulder.

## 2021-12-20 NOTE — Progress Notes (Signed)
   Subjective:   Patient ID: Ronald Stafford, male    DOB: 05/14/1930, 88 y.o.   MRN: [id number]  HPI The patient is an 88 YO man coming in for left shoulder and back pain. Lifting heavy things recently.   Review of Systems  Constitutional: Negative.   HENT: Negative.    Eyes: Negative.   Respiratory:  Negative for cough, chest tightness and shortness of breath.   Cardiovascular:  Negative for chest pain, palpitations and leg swelling.  Gastrointestinal:  Negative for abdominal distention, abdominal pain, constipation, diarrhea, nausea and vomiting.  Musculoskeletal:  Positive for back pain.  Skin: Negative.   Neurological: Negative.   Psychiatric/Behavioral: Negative.      Objective:  Physical Exam Constitutional:      Appearance: He is well-developed.  HENT:     Head: Normocephalic and atraumatic.  Cardiovascular:     Rate and Rhythm: Normal rate and regular rhythm.  Pulmonary:     Effort: Pulmonary effort is normal. No respiratory distress.     Breath sounds: Normal breath sounds. No wheezing or rales.  Abdominal:     General: Bowel sounds are normal. There is no distension.     Palpations: Abdomen is soft.     Tenderness: There is no abdominal tenderness. There is no rebound.  Musculoskeletal:        General: Tenderness present.     Cervical back: Normal range of motion.     Comments: Left shoulder and upper back tenderness  Skin:    General: Skin is warm and dry.  Neurological:     Mental Status: He is alert and oriented to person, place, and time.     Coordination: Coordination normal.     Vitals:   12/20/21 1340  BP: 120/84  Pulse: (!) 103  Temp: 97.8 F (36.6 C)  TempSrc: Oral  SpO2: 94%  Weight: 179 lb 8 oz (81.4 kg)  Height: '6\' 2"'$  (1.88 m)   EKG: Rate 82, axis normal, interval normal, sinus, no st or t wave changes, no significant change compared to prior 2022  Assessment & Plan:

## 2021-12-23 NOTE — Assessment & Plan Note (Signed)
Mostly left shoulder and upper back. Some into the chest. EKG done today and no changes. Rx prednisone 5 day course. Suspect arthritis shoulder worsened by lifting heavy objects.

## 2021-12-29 ENCOUNTER — Encounter: Payer: Self-pay | Admitting: Internal Medicine

## 2021-12-29 NOTE — Progress Notes (Signed)
Subjective:    Patient ID: Ronald Stafford, male    DOB: Dec 29, 1930, 88 y.o.   MRN: [id number]      HPI Ronald Stafford is here for No chief complaint on file.   Breathing problems, pain in chest, , pain in shoulder and back  Left shoulder pain -he was seen 10/10 for back pain and left shoulder pain.  It was thought maybe it was related to lifting things heavy-he is very active.  He was prescribed 5 days of prednisone orally and unfortunately that did not improve the pain.  His pain is worse with movement and better with rest.  He was wondering about an injection to see if that helps.  He denies any previous shoulder injuries.  He does not know how he injured it.  For a while he has had breathing problems and he has been using a nebulizer with albuterol-ipratropium.  He uses it whenever he needs it and it works well and it takes care of some chest discomfort that he gets.  He needs a refill of this.  He denies any palpitations or shortness of breath.  Did not have any cold symptoms or coughing/wheezing.  Havng dizziness, which is not new.  He typically uses the meclizine as needed and needs a refill of this.  The medication does help.   Medications and allergies reviewed with patient and updated if appropriate.  Current Outpatient Medications on File Prior to Visit  Medication Sig Dispense Refill   acetaminophen (TYLENOL) 325 MG tablet Take 650 mg by mouth every 6 (six) hours as needed for moderate pain or headache.     atorvastatin (LIPITOR) 40 MG tablet TAKE ONE TABLET BY MOUTH EVERY MORNING 90 tablet 1   clopidogrel (PLAVIX) 75 MG tablet TAKE ONE TABLET BY MOUTH EVERY MORNING 90 tablet 5   meclizine (ANTIVERT) 12.5 MG tablet Take 1 tablet (12.5 mg total) by mouth 3 (three) times daily as needed for dizziness. 30 tablet 0   mirabegron ER (MYRBETRIQ) 25 MG TB24 tablet Take 1 tablet (25 mg total) by mouth daily as needed. 90 tablet 1   Omega-3 Fatty Acids (FISH OIL) 500 MG CAPS Take 1 capsule by  mouth daily.     predniSONE (DELTASONE) 20 MG tablet Take 2 tablets (40 mg total) by mouth daily with breakfast. 10 tablet 0   vitamin C (ASCORBIC ACID) 250 MG tablet Take 250 mg by mouth daily.     No current facility-administered medications on file prior to visit.    Review of Systems  Constitutional:  Negative for fever.  Respiratory:  Negative for cough, chest tightness, shortness of breath and wheezing.   Cardiovascular:  Positive for chest pain. Negative for palpitations and leg swelling.  Musculoskeletal:  Positive for arthralgias.  Neurological:  Positive for dizziness and headaches.       Objective:   Vitals:   12/30/21 1120  BP: 138/88  Pulse: (!) 102  Temp: 97.8 F (36.6 C)  SpO2: 94%   BP Readings from Last 3 Encounters:  12/30/21 138/88  12/20/21 120/84  07/15/21 (!) 148/80   Wt Readings from Last 3 Encounters:  12/30/21 184 lb (83.5 kg)  12/20/21 179 lb 8 oz (81.4 kg)  07/15/21 183 lb 12.8 oz (83.4 kg)   Body mass index is 23.62 kg/m.    Physical Exam Constitutional:      General: He is not in acute distress.    Appearance: Normal appearance. He is not ill-appearing.  HENT:  Head: Normocephalic and atraumatic.  Cardiovascular:     Rate and Rhythm: Normal rate and regular rhythm.  Pulmonary:     Effort: Pulmonary effort is normal. No respiratory distress.     Breath sounds: Normal breath sounds. No wheezing or rales.  Musculoskeletal:        General: Tenderness (Tenderness with palpation left shoulder.  Increased pain with movement of shoulder.  Decreased pain left breast) present. No deformity.  Skin:    General: Skin is warm and dry.  Neurological:     Mental Status: He is alert.     Sensory: No sensory deficit.     Motor: No weakness.            Assessment & Plan:    See Problem List for Assessment and Plan of chronic medical problems.

## 2021-12-30 ENCOUNTER — Encounter: Payer: Self-pay | Admitting: Internal Medicine

## 2021-12-30 ENCOUNTER — Ambulatory Visit (INDEPENDENT_AMBULATORY_CARE_PROVIDER_SITE_OTHER): Payer: Medicare Other | Admitting: Internal Medicine

## 2021-12-30 VITALS — BP 138/88 | HR 102 | Temp 97.8°F | Ht 74.0 in | Wt 184.0 lb

## 2021-12-30 DIAGNOSIS — R0789 Other chest pain: Secondary | ICD-10-CM | POA: Diagnosis not present

## 2021-12-30 DIAGNOSIS — M25512 Pain in left shoulder: Secondary | ICD-10-CM

## 2021-12-30 DIAGNOSIS — R42 Dizziness and giddiness: Secondary | ICD-10-CM | POA: Diagnosis not present

## 2021-12-30 MED ORDER — MECLIZINE HCL 12.5 MG PO TABS: 12.5000 mg | ORAL_TABLET | Freq: Three times a day (TID) | ORAL | 2 refills | Status: DC | PRN

## 2021-12-30 MED ORDER — METHYLPREDNISOLONE ACETATE 80 MG/ML IJ SUSP
80.0000 mg | Freq: Once | INTRAMUSCULAR | Status: AC
  Administered 2021-12-30: 80 mg via INTRAMUSCULAR

## 2021-12-30 MED ORDER — IPRATROPIUM-ALBUTEROL 0.5-2.5 (3) MG/3ML IN SOLN: 3.0000 mL | RESPIRATORY_TRACT | 2 refills | Status: DC | PRN

## 2021-12-30 NOTE — Assessment & Plan Note (Signed)
Acute on chronic He has had pain in his left shoulder in the past related to a fall earlier this year This may be a flare of an old injury or something new No relief with 5 days of oral prednisone We will give him Depo-Medrol 80 mg IM x1 today Advised if there is no improvement he should let us know so that we can arrange for him to see sports medicine next week

## 2021-12-30 NOTE — Assessment & Plan Note (Signed)
Has been experiencing left-sided sternum pain intermittently for a while He has been using the nebulizer treatment with ipratropium-albuterol as needed for this and it takes away the pain Discussed concern about possibility of cardiac pain and he does not feel like that is it and does not feel like he needs further evaluation at this time Agrees that if his pain continues he will let me know so we can evaluate this further Had EKG 10 days ago which was normal Continue albuterol-ipratropium neb treatments as needed

## 2021-12-30 NOTE — Assessment & Plan Note (Signed)
Chronic Intermittent Continue meclizine as needed-refilled today

## 2021-12-30 NOTE — Patient Instructions (Addendum)
      Your received a steroid injection    Medications changes include :   none      Return if symptoms worsen or fail to improve.

## 2022-01-11 ENCOUNTER — Ambulatory Visit (INDEPENDENT_AMBULATORY_CARE_PROVIDER_SITE_OTHER): Payer: Medicare Other

## 2022-01-11 ENCOUNTER — Ambulatory Visit (INDEPENDENT_AMBULATORY_CARE_PROVIDER_SITE_OTHER): Payer: Medicare Other | Admitting: Emergency Medicine

## 2022-01-11 ENCOUNTER — Encounter: Payer: Self-pay | Admitting: Emergency Medicine

## 2022-01-11 VITALS — BP 180/84 | HR 110 | Temp 98.3°F | Ht 74.0 in | Wt 178.5 lb

## 2022-01-11 DIAGNOSIS — R0989 Other specified symptoms and signs involving the circulatory and respiratory systems: Secondary | ICD-10-CM

## 2022-01-11 DIAGNOSIS — J22 Unspecified acute lower respiratory infection: Secondary | ICD-10-CM | POA: Diagnosis not present

## 2022-01-11 DIAGNOSIS — R059 Cough, unspecified: Secondary | ICD-10-CM

## 2022-01-11 DIAGNOSIS — I10 Essential (primary) hypertension: Secondary | ICD-10-CM

## 2022-01-11 LAB — POCT INFLUENZA A/B
Influenza A, POC: NEGATIVE
Influenza B, POC: NEGATIVE

## 2022-01-11 LAB — POC COVID19 BINAXNOW: SARS Coronavirus 2 Ag: NEGATIVE

## 2022-01-11 MED ORDER — VALSARTAN 80 MG PO TABS: 80.0000 mg | ORAL_TABLET | Freq: Every day | ORAL | 3 refills | Status: DC

## 2022-01-11 MED ORDER — AZITHROMYCIN 250 MG PO TABS: ORAL_TABLET | ORAL | 0 refills | Status: DC

## 2022-01-11 NOTE — Assessment & Plan Note (Signed)
May be developing secondary bacterial infection. May benefit from daily azithromycin for 5 days. No pneumonia on chest x-ray. Advised to rest and stay well-hydrated Advised to contact the office if no better or worse during the next several days.

## 2022-01-11 NOTE — Assessment & Plan Note (Signed)
Continue over-the-counter Mucinex DM. Advised to rest and stay well-hydrated Avoid over-the-counter decongestants due to hypertension concerns

## 2022-01-11 NOTE — Assessment & Plan Note (Signed)
Continue over-the-counter Mucinex DM and cough drops.

## 2022-01-11 NOTE — Assessment & Plan Note (Signed)
Elevated numbers in the office and at home. BP Readings from Last 3 Encounters:  01/11/22 (!) 180/84  12/30/21 138/88  12/20/21 120/84  Recommend to start valsartan 80 mg daily.

## 2022-01-11 NOTE — Progress Notes (Signed)
Ronald Stafford 88 y.o.   Chief Complaint  Patient presents with   Cough    Cough, congestion    HISTORY OF PRESENT ILLNESS: Acute problem visit today. This is a 88 y.o. male complaining of productive cough and congestion since last Saturday, 4 days ago.  Feeling worse today. Denies difficulty breathing.  Denies chest pain or any other associated symptoms. Has history of hypertension but not on medication at present time.  Cough Pertinent negatives include no chest pain, chills, fever, headaches, rash or shortness of breath.     Prior to Admission medications   Medication Sig Start Date End Date Taking? Authorizing Provider  atorvastatin (LIPITOR) 40 MG tablet TAKE ONE TABLET BY MOUTH EVERY MORNING 11/15/21  Yes Burns, Claudina Lick, MD  clopidogrel (PLAVIX) 75 MG tablet TAKE ONE TABLET BY MOUTH EVERY MORNING 01/11/21  Yes Burns, Claudina Lick, MD  meclizine (ANTIVERT) 12.5 MG tablet Take 1 tablet (12.5 mg total) by mouth 3 (three) times daily as needed for dizziness. 12/30/21  Yes Burns, Claudina Lick, MD  mirabegron ER (MYRBETRIQ) 25 MG TB24 tablet Take 1 tablet (25 mg total) by mouth daily as needed. 07/04/21  Yes Burns, Claudina Lick, MD  Omega-3 Fatty Acids (FISH OIL) 500 MG CAPS Take 1 capsule by mouth daily.   Yes [provider]  vitamin C (ASCORBIC ACID) 250 MG tablet Take 250 mg by mouth daily.   Yes [provider]    Allergies  Allergen Reactions   Chicken Allergy Nausea And Vomiting and Other (See Comments)    Digestive problems    Patient Active Problem List   Diagnosis Date Noted   Skin tear of hand without complication, initial encounter 07/15/2021   Chest wall discomfort 06/02/2021   Carpal tunnel syndrome, right upper limb 05/24/2021   Cubital tunnel syndrome on right 05/24/2021   Acquired cyst of kidney 06/15/2020   Gross hematuria 06/15/2020   Cervical radiculopathy 06/01/2020   Memory difficulty 06/01/2020   Acute pain of right shoulder 07/10/2019   Dizziness  01/03/2019   Urinary incontinence 10/31/2018   URI (upper respiratory infection) 06/04/2018   Right hand paresthesia 08/10/2017   Skin abnormalities 06/13/2017   History of total hip arthroplasty 03/28/2017   Failed total hip arthroplasty ([place]) 02/14/2017   Osteoarthritis of left hip 12/10/2016   Acute right ankle pain 11/07/2016   Left shoulder pain 10/04/2016   Atypical chest pain 07/30/2016   History of DVT (deep vein thrombosis) 07/30/2016   History of TIA (transient ischemic attack) 07/30/2016   Thrombocytopenia ([place]) 07/30/2016   Carotid arterial disease ([place]) 07/20/2016   Right sided weakness 06/27/2016   Left wrist pain 08/11/2015   Frequent headaches 06/22/2015   Prediabetes 02/11/2015   Peroneal tendonitis of right lower extremity 01/12/2014   Mass of right ankle 12/31/2013   Solitary pulmonary nodule 08/08/2012   SPINAL STENOSIS, LUMBAR 04/06/2010   FOOT DROP, RIGHT 05/21/2008   Hyperlipidemia 12/25/2006   PROSTATE CANCER, HX OF 12/25/2006   POLYP, COLON 05/02/2006    Past Medical History:  Diagnosis Date   Cancer [facility]) 2004   prostate cancer, Dr. Risa Grill   Colon polyps    Deep venous thrombosis ([place]) 1997   Postop   Depression    GERD (gastroesophageal reflux disease)    Hyperlipidemia    Hypertension    Radial head fracture, closed 09/18/2013   Shoulder fracture, left 02/23/2011   immobilized in sling , Dr Norris(no surgery)    Past Surgical History:  Procedure Laterality Date   COLONOSCOPY  2006   Negative,Dr.Edwards   colonoscopy with polypectomy      PMH of   ESOPHAGOGASTRODUODENOSCOPY N/A 02/23/2017   Procedure: ESOPHAGOGASTRODUODENOSCOPY (EGD);  Surgeon: Otis Brace, MD;  Location: [facility];  Service: Gastroenterology;  Laterality: N/A;   JOINT REPLACEMENT  1997, 2001   Total Hip replacement X 2   LUMBAR SPINE SURGERY  09/28/2010   Dr Rolena Infante; for spinal stenosis   MECKEL DIVERTICULUM EXCISION     Age 54   PROSTATECTOMY  2004   w/  radiation, Dr Risa Grill   TOTAL HIP REVISION Left 02/14/2017   Procedure: Left hip polyethylene EXCHANGE;  Surgeon: Gaynelle Arabian, MD;  Location: [facility];  Service: Orthopedics;  Laterality: Left;   VASECTOMY      Social History   Socioeconomic History   Marital status: Widowed    Spouse name: Not on file   Number of children: 2   Years of education: Not on file   Highest education level: Not on file  Occupational History   Occupation: retired  Tobacco Use   Smoking status: Former    Packs/day: 0.50    Years: 5.00    Total pack years: 2.50    Types: Cigarettes    Quit date: 03/13/1988    Years since quitting: 33.8   Smokeless tobacco: Former   Tobacco comments:    Quit at about age 77 / chewed tobacca x 1 year  Vaping Use   Vaping Use: Never used  Substance and Sexual Activity   Alcohol use: No    Alcohol/week: 0.0 standard drinks of alcohol   Drug use: No   Sexual activity: Never  Other Topics Concern   Not on file  Social History Narrative   Lives alone; Wife passed 12-26-12;   Died of ovarian cancer; breast cancer; other cancer;      Social Determinants of Health   Financial Resource Strain: Low Risk  (08/26/2021)   Overall Financial Resource Strain (CARDIA)    Difficulty of Paying Living Expenses: Not hard at all  Food Insecurity: No Food Insecurity (08/26/2021)   Hunger Vital Sign    Worried About Running Out of Food in the Last Year: Never true    Ran Out of Food in the Last Year: Never true  Transportation Needs: No Transportation Needs (08/26/2021)   PRAPARE - Hydrologist (Medical): No    Lack of Transportation (Non-Medical): No  Physical Activity: Insufficiently Active (08/26/2021)   Exercise Vital Sign    Days of Exercise per Week: 3 days    Minutes of Exercise per Session: 30 min  Stress: No Stress Concern Present (08/26/2021)   [place]    Feeling of  Stress : Not at all  Social Connections: Moderately Isolated (08/26/2021)   Social Connection and Isolation Panel [NHANES]    Frequency of Communication with Friends and Family: Three times a week    Frequency of Social Gatherings with Friends and Family: Three times a week    Attends Religious Services: More than 4 times per year    Active Member of Clubs or Organizations: No    Attends Archivist Meetings: Never    Marital Status: Widowed  Intimate Partner Violence: Not At Risk (08/26/2021)   Humiliation, Afraid, Rape, and Kick questionnaire    Fear of Current or Ex-Partner: No    Emotionally Abused: No    Physically  Abused: No    Sexually Abused: No    Family History  Problem Relation Age of Onset   Prostate cancer Father    Testicular cancer Son      Review of Systems  Constitutional: Negative.  Negative for chills and fever.  HENT:  Positive for congestion.   Respiratory:  Positive for cough. Negative for sputum production and shortness of breath.   Cardiovascular: Negative.  Negative for chest pain and palpitations.  Gastrointestinal:  Negative for abdominal pain, nausea and vomiting.  Genitourinary: Negative.   Skin: Negative.  Negative for rash.  Neurological: Negative.  Negative for dizziness and headaches.  All other systems reviewed and are negative.  Today's Vitals   01/11/22 0937 01/11/22 0942  BP: (!) 160/80 (!) 180/84  Pulse: (!) 110   Temp: 98.3 F (36.8 C)   TempSrc: Oral   SpO2: 92%   Weight: 178 lb 8 oz (81 kg)   Height: '6\' 2"'$  (1.88 m)    Body mass index is 22.92 kg/m.   Physical Exam Vitals reviewed.  Constitutional:      Appearance: Normal appearance.  HENT:     Head: Normocephalic.     Mouth/Throat:     Mouth: Mucous membranes are moist.     Pharynx: Oropharynx is clear.  Eyes:     Extraocular Movements: Extraocular movements intact.     Conjunctiva/sclera: Conjunctivae normal.     Pupils: Pupils are equal, round, and  reactive to light.  Cardiovascular:     Rate and Rhythm: Normal rate and regular rhythm.     Pulses: Normal pulses.     Heart sounds: Normal heart sounds.  Pulmonary:     Effort: Pulmonary effort is normal.     Breath sounds: Normal breath sounds.  Abdominal:     Palpations: Abdomen is soft.     Tenderness: There is no abdominal tenderness.  Musculoskeletal:     Cervical back: No tenderness.  Lymphadenopathy:     Cervical: No cervical adenopathy.  Skin:    General: Skin is warm and dry.     Capillary Refill: Capillary refill takes less than 2 seconds.  Neurological:     General: No focal deficit present.     Mental Status: He is alert and oriented to person, place, and time.  Psychiatric:        Mood and Affect: Mood normal.        Behavior: Behavior normal.   Results for orders placed or performed in visit on 01/11/22 (from the past 24 hour(s))  POC COVID-19     Status: Normal   Collection Time: 01/11/22 10:17 AM  Result Value Ref Range   SARS Coronavirus 2 Ag Negative Negative  POCT Influenza A/B     Status: Normal   Collection Time: 01/11/22 10:18 AM  Result Value Ref Range   Influenza A, POC Negative Negative   Influenza B, POC Negative Negative   DG Chest 2 View  Result Date: 01/11/2022 CLINICAL DATA:  Cough and congestion for several days, initial encounter EXAM: CHEST - 2 VIEW COMPARISON:  CT from 07/25/2021 FINDINGS: Cardiac shadow is within normal limits. Aortic calcifications are again seen. The lungs are well aerated bilaterally. No focal infiltrate or effusion is seen. Degenerative changes of the thoracic spine are noted. Old rib fractures are seen on the left. IMPRESSION: No acute abnormality noted. Electronically Signed   By: Inez Catalina M.D.   On: 01/11/2022 10:48     ASSESSMENT & PLAN: A  total of 43 minutes was spent with the patient and counseling/coordination of care regarding.  For this visit, review of available medical records, review of multiple  chronic medical conditions and their management, review of all medications, review of chest x-ray report, diagnosis of lower respiratory infection and treatment, prognosis, documentation, and need for follow-up.  Problem List Items Addressed This Visit       Cardiovascular and Mediastinum   Essential hypertension    Elevated numbers in the office and at home. BP Readings from Last 3 Encounters:  01/11/22 (!) 180/84  12/30/21 138/88  12/20/21 120/84  Recommend to start valsartan 80 mg daily.       Relevant Medications   valsartan (DIOVAN) 80 MG tablet     Respiratory   Lower respiratory infection - Primary    May be developing secondary bacterial infection. May benefit from daily azithromycin for 5 days. No pneumonia on chest x-ray. Advised to rest and stay well-hydrated Advised to contact the office if no better or worse during the next several days.      Relevant Medications   azithromycin (ZITHROMAX) 250 MG tablet   Chest congestion    Continue over-the-counter Mucinex DM. Advised to rest and stay well-hydrated Avoid over-the-counter decongestants due to hypertension concerns      Relevant Orders   POC COVID-19 (Completed)   POCT Influenza A/B (Completed)   DG Chest 2 View (Completed)     Other   Cough    Continue over-the-counter Mucinex DM and cough drops.      Relevant Orders   POC COVID-19 (Completed)   POCT Influenza A/B (Completed)   DG Chest 2 View (Completed)   Patient Instructions  Acute Bronchitis, Adult  Acute bronchitis is when air tubes in the lungs (bronchi) suddenly get swollen. The condition can make it hard for you to breathe. In adults, acute bronchitis usually goes away within 2 weeks. A cough caused by bronchitis may last up to 3 weeks. Smoking, allergies, and asthma can make the condition worse. What are the causes? Germs that cause cold and flu (viruses). The most common cause of this condition is the virus that causes the common  cold. Bacteria. Substances that bother (irritate) the lungs, including: Smoke from cigarettes and other types of tobacco. Dust and pollen. Fumes from chemicals, gases, or burned fuel. Indoor or outdoor air pollution. What increases the risk? A weak body's defense system. This is also called the immune system. Any condition that affects your lungs and breathing, such as asthma. What are the signs or symptoms? A cough. Coughing up clear, yellow, or green mucus. Making high-pitched whistling sounds when you breathe, most often when you breathe out (wheezing). Runny or stuffy nose. Having too much mucus in your lungs (chest congestion). Shortness of breath. Body aches. A sore throat. How is this treated? Acute bronchitis may go away over time without treatment. Your doctor may tell you to: Drink more fluids. This will help thin your mucus so it is easier to cough up. Use a device that gets medicine into your lungs (inhaler). Use a vaporizer or a humidifier. These are machines that add water to the air. This helps with coughing and poor breathing. Take a medicine that thins mucus and helps clear it from your lungs. Take a medicine that prevents or stops coughing. It is not common to take an antibiotic medicine for this condition. Follow these instructions at home:  Take over-the-counter and prescription medicines only as told by your doctor.  Use an inhaler, vaporizer, or humidifier as told by your doctor. Take two teaspoons (10 mL) of honey at bedtime. This helps lessen your coughing at night. Drink enough fluid to keep your pee (urine) pale yellow. Do not smoke or use any products that contain nicotine or tobacco. If you need help quitting, ask your doctor. Get a lot of rest. Return to your normal activities when your doctor says that it is safe. Keep all follow-up visits. How is this prevented?  Wash your hands often with soap and water for at least 20 seconds. If you cannot use  soap and water, use hand sanitizer. Avoid contact with people who have cold symptoms. Try not to touch your mouth, nose, or eyes with your hands. Avoid breathing in smoke or chemical fumes. Make sure to get the flu shot every year. Contact a doctor if: Your symptoms do not get better in 2 weeks. You have trouble coughing up the mucus. Your cough keeps you awake at night. You have a fever. Get help right away if: You cough up blood. You have chest pain. You have very bad shortness of breath. You faint or keep feeling like you are going to faint. You have a very bad headache. Your fever or chills get worse. These symptoms may be an emergency. Get help right away. Call your local emergency services (911 in the [place] Do not wait to see if the symptoms will go away. Do not drive yourself to the hospital. Summary Acute bronchitis is when air tubes in the lungs (bronchi) suddenly get swollen. In adults, acute bronchitis usually goes away within 2 weeks. Drink more fluids. This will help thin your mucus so it is easier to cough up. Take over-the-counter and prescription medicines only as told by your doctor. Contact a doctor if your symptoms do not improve after 2 weeks of treatment. This information is not intended to replace advice given to you by your health care provider. Make sure you discuss any questions you have with your health care provider. Document Revised: 06/30/2020 Document Reviewed: 06/30/2020 Elsevier Patient Education  [place], MD [facility]

## 2022-01-11 NOTE — Patient Instructions (Signed)
  Acute Bronchitis, Adult  Acute bronchitis is when air tubes in the lungs (bronchi) suddenly get swollen. The condition can make it hard for you to breathe. In adults, acute bronchitis usually goes away within 2 weeks. A cough caused by bronchitis may last up to 3 weeks. Smoking, allergies, and asthma can make the condition worse. What are the causes? Germs that cause cold and flu (viruses). The most common cause of this condition is the virus that causes the common cold. Bacteria. Substances that bother (irritate) the lungs, including: Smoke from cigarettes and other types of tobacco. Dust and pollen. Fumes from chemicals, gases, or burned fuel. Indoor or outdoor air pollution. What increases the risk? A weak body's defense system. This is also called the immune system. Any condition that affects your lungs and breathing, such as asthma. What are the signs or symptoms? A cough. Coughing up clear, yellow, or green mucus. Making high-pitched whistling sounds when you breathe, most often when you breathe out (wheezing). Runny or stuffy nose. Having too much mucus in your lungs (chest congestion). Shortness of breath. Body aches. A sore throat. How is this treated? Acute bronchitis may go away over time without treatment. Your doctor may tell you to: Drink more fluids. This will help thin your mucus so it is easier to cough up. Use a device that gets medicine into your lungs (inhaler). Use a vaporizer or a humidifier. These are machines that add water to the air. This helps with coughing and poor breathing. Take a medicine that thins mucus and helps clear it from your lungs. Take a medicine that prevents or stops coughing. It is not common to take an antibiotic medicine for this condition. Follow these instructions at home:  Take over-the-counter and prescription medicines only as told by your doctor. Use an inhaler, vaporizer, or humidifier as told by your doctor. Take two  teaspoons (10 mL) of honey at bedtime. This helps lessen your coughing at night. Drink enough fluid to keep your pee (urine) pale yellow. Do not smoke or use any products that contain nicotine or tobacco. If you need help quitting, ask your doctor. Get a lot of rest. Return to your normal activities when your doctor says that it is safe. Keep all follow-up visits. How is this prevented?  Wash your hands often with soap and water for at least 20 seconds. If you cannot use soap and water, use hand sanitizer. Avoid contact with people who have cold symptoms. Try not to touch your mouth, nose, or eyes with your hands. Avoid breathing in smoke or chemical fumes. Make sure to get the flu shot every year. Contact a doctor if: Your symptoms do not get better in 2 weeks. You have trouble coughing up the mucus. Your cough keeps you awake at night. You have a fever. Get help right away if: You cough up blood. You have chest pain. You have very bad shortness of breath. You faint or keep feeling like you are going to faint. You have a very bad headache. Your fever or chills get worse. These symptoms may be an emergency. Get help right away. Call your local emergency services (911 in the [place] Do not wait to see if the symptoms will go away. Do not drive yourself to the hospital. Summary Acute bronchitis is when air tubes in the lungs (bronchi) suddenly get swollen. In adults, acute bronchitis usually goes away within 2 weeks. Drink more fluids. This will help thin your mucus so it   is easier to cough up. Take over-the-counter and prescription medicines only as told by your doctor. Contact a doctor if your symptoms do not improve after 2 weeks of treatment. This information is not intended to replace advice given to you by your health care provider. Make sure you discuss any questions you have with your health care provider. Document Revised: 06/30/2020 Document Reviewed: 06/30/2020 Elsevier  Patient Education  2023 [employer]  

## 2022-02-13 NOTE — Telephone Encounter (Signed)
No notes needed closing encounter

## 2022-02-15 ENCOUNTER — Other Ambulatory Visit: Payer: Self-pay | Admitting: Internal Medicine

## 2022-04-06 ENCOUNTER — Other Ambulatory Visit: Payer: Self-pay

## 2022-04-06 ENCOUNTER — Observation Stay (HOSPITAL_COMMUNITY)
Admission: EM | Admit: 2022-04-06 | Discharge: 2022-04-07 | Disposition: A | Discharge status: Home / Self Care | Payer: Medicare Other | Attending: Internal Medicine | Admitting: Internal Medicine

## 2022-04-06 ENCOUNTER — Telehealth: Payer: Self-pay

## 2022-04-06 ENCOUNTER — Ambulatory Visit (INDEPENDENT_AMBULATORY_CARE_PROVIDER_SITE_OTHER): Payer: Medicare Other | Admitting: Nurse Practitioner

## 2022-04-06 ENCOUNTER — Other Ambulatory Visit (HOSPITAL_COMMUNITY): Payer: Self-pay

## 2022-04-06 ENCOUNTER — Ambulatory Visit
Admission: RE | Admit: 2022-04-06 | Discharge: 2022-04-06 | Disposition: A | Discharge status: Home / Self Care | Payer: Medicare Other | Source: Ambulatory Visit | Attending: Nurse Practitioner | Admitting: Nurse Practitioner

## 2022-04-06 VITALS — BP 154/72 | HR 114 | Temp 97.8°F | Ht 74.0 in | Wt 184.4 lb

## 2022-04-06 DIAGNOSIS — R29818 Other symptoms and signs involving the nervous system: Secondary | ICD-10-CM | POA: Diagnosis not present

## 2022-04-06 DIAGNOSIS — Z743 Need for continuous supervision: Secondary | ICD-10-CM | POA: Diagnosis not present

## 2022-04-06 DIAGNOSIS — Z87891 Personal history of nicotine dependence: Secondary | ICD-10-CM | POA: Diagnosis not present

## 2022-04-06 DIAGNOSIS — I214 Non-ST elevation (NSTEMI) myocardial infarction: Secondary | ICD-10-CM | POA: Diagnosis not present

## 2022-04-06 DIAGNOSIS — N281 Cyst of kidney, acquired: Secondary | ICD-10-CM | POA: Diagnosis not present

## 2022-04-06 DIAGNOSIS — R079 Chest pain, unspecified: Secondary | ICD-10-CM

## 2022-04-06 DIAGNOSIS — R109 Unspecified abdominal pain: Secondary | ICD-10-CM | POA: Insufficient documentation

## 2022-04-06 DIAGNOSIS — R42 Dizziness and giddiness: Secondary | ICD-10-CM | POA: Diagnosis not present

## 2022-04-06 DIAGNOSIS — Z8546 Personal history of malignant neoplasm of prostate: Secondary | ICD-10-CM

## 2022-04-06 DIAGNOSIS — R6889 Other general symptoms and signs: Secondary | ICD-10-CM | POA: Diagnosis not present

## 2022-04-06 DIAGNOSIS — I499 Cardiac arrhythmia, unspecified: Secondary | ICD-10-CM | POA: Diagnosis not present

## 2022-04-06 DIAGNOSIS — R7989 Other specified abnormal findings of blood chemistry: Principal | ICD-10-CM | POA: Diagnosis present

## 2022-04-06 DIAGNOSIS — E785 Hyperlipidemia, unspecified: Secondary | ICD-10-CM | POA: Diagnosis present

## 2022-04-06 DIAGNOSIS — Z79899 Other long term (current) drug therapy: Secondary | ICD-10-CM | POA: Diagnosis not present

## 2022-04-06 DIAGNOSIS — Z7902 Long term (current) use of antithrombotics/antiplatelets: Secondary | ICD-10-CM | POA: Insufficient documentation

## 2022-04-06 DIAGNOSIS — Z8673 Personal history of transient ischemic attack (TIA), and cerebral infarction without residual deficits: Secondary | ICD-10-CM | POA: Diagnosis not present

## 2022-04-06 DIAGNOSIS — I714 Abdominal aortic aneurysm, without rupture, unspecified: Secondary | ICD-10-CM | POA: Diagnosis not present

## 2022-04-06 DIAGNOSIS — Z86718 Personal history of other venous thrombosis and embolism: Secondary | ICD-10-CM | POA: Diagnosis not present

## 2022-04-06 DIAGNOSIS — I739 Peripheral vascular disease, unspecified: Secondary | ICD-10-CM | POA: Diagnosis present

## 2022-04-06 DIAGNOSIS — I672 Cerebral atherosclerosis: Secondary | ICD-10-CM | POA: Diagnosis not present

## 2022-04-06 DIAGNOSIS — Z96642 Presence of left artificial hip joint: Secondary | ICD-10-CM | POA: Insufficient documentation

## 2022-04-06 DIAGNOSIS — I10 Essential (primary) hypertension: Secondary | ICD-10-CM | POA: Diagnosis not present

## 2022-04-06 DIAGNOSIS — E7800 Pure hypercholesterolemia, unspecified: Secondary | ICD-10-CM | POA: Diagnosis present

## 2022-04-06 DIAGNOSIS — I779 Disorder of arteries and arterioles, unspecified: Secondary | ICD-10-CM | POA: Diagnosis present

## 2022-04-06 DIAGNOSIS — R404 Transient alteration of awareness: Secondary | ICD-10-CM | POA: Diagnosis not present

## 2022-04-06 DIAGNOSIS — R413 Other amnesia: Secondary | ICD-10-CM | POA: Diagnosis present

## 2022-04-06 DIAGNOSIS — R100 Acute abdomen: Secondary | ICD-10-CM | POA: Diagnosis not present

## 2022-04-06 LAB — CBC
HCT: 39.2 % (ref 39.0–52.0)
HCT: 39.4 % (ref 39.0–52.0)
Hemoglobin: 12.8 g/dL — ABNORMAL LOW (ref 13.0–17.0)
Hemoglobin: 12.3 g/dL — ABNORMAL LOW (ref 13.0–17.0)
MCH: 28.4 pg (ref 26.0–34.0)
MCHC: 32.6 g/dL (ref 30.0–36.0)
MCHC: 31.2 g/dL (ref 30.0–36.0)
MCV: 86.7 fl (ref 78.0–100.0)
MCV: 91 fL (ref 80.0–100.0)
Platelets: 110 10*3/uL — ABNORMAL LOW (ref 150.0–400.0)
Platelets: 106 10*3/uL — ABNORMAL LOW (ref 150–400)
RBC: 4.53 Mil/uL (ref 4.22–5.81)
RBC: 4.33 MIL/uL (ref 4.22–5.81)
RDW: 16.6 % — ABNORMAL HIGH (ref 11.5–15.5)
RDW: 15.3 % (ref 11.5–15.5)
WBC: 4.7 10*3/uL (ref 4.0–10.5)
WBC: 3.7 10*3/uL — ABNORMAL LOW (ref 4.0–10.5)
nRBC: 0 % (ref 0.0–0.2)

## 2022-04-06 LAB — BASIC METABOLIC PANEL
Anion gap: 9 (ref 5–15)
BUN: 18 mg/dL (ref 8–23)
CO2: 25 mmol/L (ref 22–32)
Calcium: 9.2 mg/dL (ref 8.9–10.3)
Chloride: 107 mmol/L (ref 98–111)
Creatinine, Ser: 1.02 mg/dL (ref 0.61–1.24)
GFR, Estimated: >60 mL/min (ref >60)
Glucose, Bld: 144 mg/dL — ABNORMAL HIGH (ref 70–99)
Potassium: 3.5 mmol/L (ref 3.5–5.1)
Sodium: 141 mmol/L (ref 135–145)

## 2022-04-06 LAB — COMPREHENSIVE METABOLIC PANEL
ALT: 27 U/L (ref 0–53)
AST: 27 U/L (ref 0–37)
Albumin: 4.3 g/dL (ref 3.5–5.2)
Alkaline Phosphatase: 60 U/L (ref 39–117)
BUN: 16 mg/dL (ref 6–23)
CO2: 31 mEq/L (ref 19–32)
Calcium: 9.8 mg/dL (ref 8.4–10.5)
Chloride: 106 mEq/L (ref 96–112)
Creatinine, Ser: 0.86 mg/dL (ref 0.40–1.50)
GFR: 76.84 mL/min (ref >60.00)
Glucose, Bld: 101 mg/dL — ABNORMAL HIGH (ref 70–99)
Potassium: 4.4 mEq/L (ref 3.5–5.1)
Sodium: 144 mEq/L (ref 135–145)
Total Bilirubin: 0.8 mg/dL (ref 0.2–1.2)
Total Protein: 7 g/dL (ref 6.0–8.3)

## 2022-04-06 LAB — TROPONIN I (HIGH SENSITIVITY)
High Sens Troponin I: 68 ng/L (ref 2–17)
Troponin I (High Sensitivity): 72 ng/L — ABNORMAL HIGH (ref <18)

## 2022-04-06 LAB — POC COVID19 BINAXNOW: SARS Coronavirus 2 Ag: NEGATIVE

## 2022-04-06 LAB — CBG MONITORING, ED: Glucose-Capillary: 141 mg/dL — ABNORMAL HIGH (ref 70–99)

## 2022-04-06 LAB — POCT RESPIRATORY SYNCYTIAL VIRUS: RSV Rapid Ag: NEGATIVE

## 2022-04-06 LAB — POCT INFLUENZA A/B
Influenza A, POC: NEGATIVE
Influenza B, POC: NEGATIVE

## 2022-04-06 MED ORDER — SODIUM CHLORIDE (PF) 0.9 % IJ SOLN
INTRAMUSCULAR | Status: AC
  Filled 2022-04-06: qty 50

## 2022-04-06 NOTE — Telephone Encounter (Signed)
Called both number provided, went to voice mail   Called [phone] twice, went to voice mail, left voice mail for him to call us back    [facility] Ronald Stafford, twice, voice mail, Left voice mail to let [name] give [place] a call back

## 2022-04-06 NOTE — ED Provider Triage Note (Signed)
Emergency Medicine Provider Triage Evaluation Note  Ronald Stafford , a 89 y.o. male  was evaluated in triage.  Pt complains of lightheadedness, especially when standing and walking.  Patient went to his doctor's office earlier today with a complaint of falls, and epigastric and chest pain for the past 2 days.  He denies any current chest pain but states that he does feel like he is going to fall backwards every time he stands up.  He was called by his PCP this afternoon and directed to go to the ER for elevated troponin.  He was found to be hypertensive and tachycardic with EMS.  He has been off his valsartan for "a while".   Review of Systems  Positive: Lightheadedness Negative: Chest pain, shortness of breath, syncope  Physical Exam  BP (!) 158/84 (BP Location: Left Wrist)   Pulse 96   Temp 98 F (36.7 C) (Oral)   Resp 16   SpO2 97%  Gen:   Awake, no distress   Resp:  Normal effort  MSK:   Moves extremities without difficulty  Other:    Medical Decision Making  Medically screening exam initiated at 9:52 PM.  Appropriate orders placed.  Ronald Stafford was informed that the remainder of the evaluation will be completed by another provider, this initial triage assessment does not replace that evaluation, and the importance of remaining in the ED until their evaluation is complete.     Ronald Stafford, [place] 04/06/22 2153

## 2022-04-06 NOTE — Telephone Encounter (Signed)
Spoke to pt and made aware of lab result and per provider advice to go to the ED, pt stated that he would

## 2022-04-06 NOTE — Assessment & Plan Note (Signed)
Concern for possible ACS versus GERD versus acute appendicitis.  Recommended patient proceed to the emergency department, however patient is asking if any testing can be done outside of the emergency department.  We did discuss that doing outpatient workup can result in delayed diagnosis and treatment which may result in poor outcomes including possible death.  Patient reports understanding and would prefer to still undergo evaluation in the outpatient setting.  Will order stat troponin as well as CBC, CMP, and stat abdominal and pelvis CT scan.  Further recommendations may be made based upon the results.

## 2022-04-06 NOTE — Telephone Encounter (Signed)
Update: upon chart review I did not see where patient had checked in to the emergency department. I called the patient and he stated he is feeling much better and decided not to go to the emergency department. He was told that his labs indicated that he is having a heart attack and that the risk of not going to the emergency department for emergent treatment includes death. Additionally, he was told that time is of the essence as the longer he waits to be treated the more potential permanent damage to his heart may occur. Patient states his understanding and will consider my recommendation and information.

## 2022-04-06 NOTE — Telephone Encounter (Signed)
Patient's troponin level came back elevated concerning for ACS. Patient called by myself this afternoon and notified of result he was told to call 911 and proceed to the ER. He reports his understanding and plan to proceed to ER today.

## 2022-04-06 NOTE — ED Triage Notes (Addendum)
Presents from home via [facility] for hypertension (230/120, off valsartan for some time), dizziness intermittently "for a while", fall on tues from dizziness for which he was seen by PCP today.   Was called by PCP this afternoon and directed to go the ER for elevated troponin.  230/120, 102bpm, CBG 170, 100%.

## 2022-04-06 NOTE — Telephone Encounter (Signed)
Called pt and left voice mail to go to the ER and call our office back

## 2022-04-06 NOTE — Telephone Encounter (Signed)
Update: I called patient back to again emphasize the importance of evaluation regarding his elevated troponin. I offered to call 911 on his behalf so the EMS could come and evaluate patient and transport him to the ER. He stated he would be agreeable to that. I have called 911, I was able to stay on the phone with patient until they arrived. Patient's son also came to his home and patient allowed me to update his son regarding the current situation. Marya Amsler reported his understanding and phone call was ended once EMS arrived to assess patient.

## 2022-04-06 NOTE — Telephone Encounter (Signed)
CRITICAL VALUE STICKER  CRITICAL VALUE: Troponin 49   RECEIVER (on-site recipient of call):Dusty Raczkowski  DATE & TIME NOTIFIED: 04/06/22  MESSENGER (representative from lab):elm  MD NOTIFIED:  Yes  TIME OF NOTIFICATION: 1:05  RESPONSE:  call pt to go to the ER

## 2022-04-06 NOTE — Telephone Encounter (Signed)
Called pt @ 1:10 To go to the ER and call our office back

## 2022-04-06 NOTE — ED Provider Notes (Signed)
[place] EMERGENCY DEPARTMENT AT [facility] Provider Note   CSN: [id number] Arrival date & time: 04/06/22  2140     History  Chief Complaint  Patient presents with   Dizziness    Ronald Stafford is a 89 y.o. male.  The history is provided by the patient and medical records.  Dizziness Ronald Stafford is a 89 y.o. male who presents to the Emergency Department complaining of chest pain, elevated troponin.  He presents to the emergency department upon referral from his PCP for elevated troponin.  He states that on Monday he had a fall and hit his left shoulder in the driveway.  He does have a history of frequent falls.  He did not have any preceding symptoms.  On Tuesday he developed a squeezing central chest pain that lasted for about 15 to 20 minutes.  He also had some shoulder discomfort associated with this.  No fever, diaphoresis, nausea, vomiting.  He does report chills on Monday.  He does have some mild lower abdominal pain for 2 to 3 days as well as discomfort with urination.  He reports dizziness since this summer that waxes and wanes and feels like an unsteady off-balance sensation. No current chest pain.  History of hypertension, PAD.  No tobacco, alcohol, drug use.     Home Medications Prior to Admission medications   Medication Sig Start Date End Date Taking? Authorizing Provider  atorvastatin (LIPITOR) 40 MG tablet TAKE ONE TABLET BY MOUTH EVERY MORNING 11/15/21   Binnie Rail, MD  clopidogrel (PLAVIX) 75 MG tablet TAKE ONE TABLET BY MOUTH EVERY MORNING 02/15/22   Burns, Claudina Lick, MD  meclizine (ANTIVERT) 12.5 MG tablet Take 1 tablet (12.5 mg total) by mouth 3 (three) times daily as needed for dizziness. 12/30/21   Binnie Rail, MD  mirabegron ER (MYRBETRIQ) 25 MG TB24 tablet Take 1 tablet (25 mg total) by mouth daily as needed. 07/04/21   Binnie Rail, MD  Omega-3 Fatty Acids (FISH OIL) 500 MG CAPS Take 1 capsule by mouth daily.    [provider]   valsartan (DIOVAN) 80 MG tablet Take 1 tablet (80 mg total) by mouth daily. 01/11/22   Horald Pollen, MD  vitamin C (ASCORBIC ACID) 250 MG tablet Take 250 mg by mouth daily.    [provider]      Allergies    Chicken allergy    Review of Systems   Review of Systems  Neurological:  Positive for dizziness.  All other systems reviewed and are negative.   Physical Exam Updated Vital Signs BP 135/63   Pulse 70   Temp 97.7 F (36.5 C) (Oral)   Resp 16   Wt 81.6 kg   SpO2 94%   BMI 23.11 kg/m  Physical Exam Vitals and nursing note reviewed.  Constitutional:      Appearance: He is well-developed.  HENT:     Head: Normocephalic and atraumatic.  Cardiovascular:     Rate and Rhythm: Normal rate and regular rhythm.     Heart sounds: No murmur heard. Pulmonary:     Effort: Pulmonary effort is normal. No respiratory distress.     Breath sounds: Normal breath sounds.  Abdominal:     Palpations: Abdomen is soft.     Tenderness: There is no abdominal tenderness. There is no guarding or rebound.  Musculoskeletal:        General: No swelling or tenderness.     Comments: 2+ DP  pulse in LLE, faint DP pulse in RLE, 2+ radial pulses bilaterally.   Ecchymosis to the right 2, 3, 4 digit without tenderness.  Brisk cap refill in feet bilaterally.  Feet are warm to touch.   Skin:    General: Skin is warm and dry.  Neurological:     Mental Status: He is alert and oriented to person, place, and time.     Comments: No asymmetry of facial movements.  Visual fields grossly intact.  5/5 strength in all four extremities.   Psychiatric:        Behavior: Behavior normal.     ED Results / Procedures / Treatments   Labs (all labs ordered are listed, but only abnormal results are displayed) Labs Reviewed  BASIC METABOLIC PANEL - Abnormal; Notable for the following components:      Result Value   Glucose, Bld 144 (*)    All other components within normal limits  URINALYSIS,  ROUTINE W REFLEX MICROSCOPIC - Abnormal; Notable for the following components:   Leukocytes,Ua TRACE (*)    Bacteria, UA FEW (*)    All other components within normal limits  CBC - Abnormal; Notable for the following components:   WBC 3.7 (*)    Hemoglobin 12.3 (*)    Platelets 106 (*)    All other components within normal limits  CBG MONITORING, ED - Abnormal; Notable for the following components:   Glucose-Capillary 141 (*)    All other components within normal limits  TROPONIN I (HIGH SENSITIVITY) - Abnormal; Notable for the following components:   Troponin I (High Sensitivity) 72 (*)    All other components within normal limits  TROPONIN I (HIGH SENSITIVITY) - Abnormal; Notable for the following components:   Troponin I (High Sensitivity) 71 (*)    All other components within normal limits  APTT  PROTIME-INR  HEPARIN LEVEL (UNFRACTIONATED)    EKG EKG Interpretation  Date/Time:  Thursday April 06 2022 23:50:38 EST Ventricular Rate:  77 PR Interval:  164 QRS Duration: 93 QT Interval:  372 QTC Calculation: 421 R Axis:   25 Text Interpretation: Sinus rhythm Confirmed by Quintella Reichert [phone]) on 04/06/2022 11:54:48 PM  Radiology CT Angio Chest/Abd/Pel for Dissection W and/or W/WO  Result Date: 04/07/2022 CLINICAL DATA:  Acute aortic syndrome (AAS) suspected CP/AP, elevated troponin. Pt does have hx/o DVT - please try to bolus contrast to eval for major central PE if possible as well. EXAM: CT ANGIOGRAPHY CHEST, ABDOMEN AND PELVIS TECHNIQUE: Non-contrast CT of the chest was initially obtained. Multidetector CT imaging through the chest, abdomen and pelvis was performed using the standard protocol during bolus administration of intravenous contrast. Multiplanar reconstructed images and MIPs were obtained and reviewed to evaluate the vascular anatomy. RADIATION DOSE REDUCTION: This exam was performed according to the departmental dose-optimization program which includes automated  exposure control, adjustment of the mA and/or kV according to patient size and/or use of iterative reconstruction technique. CONTRAST:  130m OMNIPAQUE IOHEXOL 350 MG/ML SOLN COMPARISON:  CT abdomen pelvis 04/06/2022 FINDINGS: CTA CHEST FINDINGS Cardiovascular: Preferential opacification of the thoracic aorta. No evidence of thoracic aortic aneurysm or dissection. Normal heart size. No significant pericardial effusion. Severe atherosclerotic plaque of the thoracic aorta. Two vessel coronary artery calcifications. The main pulmonary artery is normal in caliber. No central or segmental pulmonary embolus. Mediastinum/Nodes: Right hilar lymph node enlarged measuring up to 1.5 cm. No left hilar lymphadenopathy. No enlarged mediastinal, hilar, or axillary lymph nodes. Thyroid gland, trachea, and esophagus demonstrate  no significant findings. Lungs/Pleura: At least mild paraseptal and moderate central emphysematous changes. No focal consolidation. Subpleural 8 mm nodule within right lower lobe. No pulmonary mass. No pleural effusion. No pneumothorax. Musculoskeletal: No chest wall abnormality. No suspicious lytic or blastic osseous lesions. No acute displaced fracture. Multilevel degenerative changes of the spine. Bilateral shoulder degenerative changes. Review of the MIP images confirms the above findings. CTA ABDOMEN AND PELVIS FINDINGS VASCULAR Aorta: Normal caliber aorta without aneurysm, dissection, vasculitis or significant stenosis. 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 Distally the celiac opacified and is slightly dilated. Associated collaterals. Otherwise the spleen patent without evidence of aneurysm, dissection, vasculitis or significant stenosis. SMA: Severe narrowing of the origin and proximal superior mesenteric artery with opacification noted distally. Tortuosity of the distal superior mesenteric artery. No dissection or aneurysm.  Renals: Both renal arteries are patent without evidence of aneurysm, dissection, vasculitis, fibromuscular dysplasia or significant stenosis. IMA: Patent without evidence of aneurysm, dissection, vasculitis or significant stenosis. Inflow: Patent without evidence of aneurysm, dissection, vasculitis or significant stenosis. Veins: No obvious venous abnormality within the limitations of this arterial phase study. Review of the MIP images confirms the above findings. NON-VASCULAR Hepatobiliary: No focal liver abnormality. No gallstones, gallbladder wall thickening, or pericholecystic fluid. No biliary dilatation. Pancreas: No focal lesion. Normal pancreatic contour. No surrounding inflammatory changes. No main pancreatic ductal dilatation. Spleen: Normal in size without focal abnormality. Adrenals/Urinary Tract: No adrenal nodule bilaterally. Bilateral kidneys enhance symmetrically. Fluid density lesions likely represent simple renal cysts. Simple renal cysts, in the absence of clinically indicated signs/symptoms, require no independent follow-up. Subcentimeter hypodensities are too small to characterize-no further follow-up indicated. No hydronephrosis. No hydroureter. The urinary bladder is unremarkable. Stomach/Bowel: Stomach is within normal limits. No evidence of bowel wall thickening or dilatation. Pneumatosis. Colonic diverticulosis. The appendix is not definitely identified with no inflammatory changes in the right lower quadrant to suggest acute appendicitis. Lymphatic: No lymphadenopathy. Reproductive: Prostate is unremarkable. Other: No intraperitoneal free fluid. No intraperitoneal free gas. No organized fluid collection. Musculoskeletal: No abdominal wall hernia or abnormality. No suspicious lytic or blastic osseous lesions. No acute displaced fracture. Multilevel degenerative changes of the spine. Grade 1 anterolisthesis of L3 on L4. L4 laminectomy. Total left hip arthroplasty partially visualized. Review  of the MIP images confirms the above findings. IMPRESSION: 1. No acute thoracic or abdominal aorta abnormality. 2. Aortic Atherosclerosis (ICD10-I70.0) - severe with severe (likely chronic) narrowing of the origin/proximal celiac and superior mesenteric arteries with opacification distally. No findings to suggest associated ischemia of the abdominal or pelvic organs. 3. Tortuosity of the distal superior mesenteric artery. 4. Right lower lobe 8 mm subpleural nodule. Non-contrast chest CT at 6-12 months is recommended. If the nodule is stable at time of repeat CT, then future CT at 18-24 months (from today's scan) is considered optional for low-risk patients, but is recommended for high-risk patients. This recommendation follows the consensus statement: Guidelines for Management of Incidental Pulmonary Nodules Detected on CT Images: From the Fleischner Society 2017; Radiology 2017; 284:228-243. 5. Nonspecific 1.5 cm right hilar enlarged lymph node. Attention on follow-up. 6. Colonic diverticulosis with no acute diverticulitis. 7. Emphysema (ICD10-J43.9). Electronically Signed   By: Iven Finn M.D.   On: 04/07/2022 01:16   CT Head Wo Contrast  Result Date: 04/07/2022 CLINICAL DATA:  Neuro deficit, acute, stroke suspected vertigo EXAM: CT HEAD WITHOUT CONTRAST TECHNIQUE: Contiguous axial images were obtained from the  base of the skull through the vertex without intravenous contrast. RADIATION DOSE REDUCTION: This exam was performed according to the departmental dose-optimization program which includes automated exposure control, adjustment of the mA and/or kV according to patient size and/or use of iterative reconstruction technique. COMPARISON:  CT head 10/31/2019 FINDINGS: Brain: Cerebral ventricle sizes are concordant with the degree of cerebral volume loss. Patchy and confluent areas of decreased attenuation are noted throughout the deep and periventricular white matter of the cerebral hemispheres  bilaterally, compatible with chronic microvascular ischemic disease. No evidence of large-territorial acute infarction. No parenchymal hemorrhage. No mass lesion. No extra-axial collection. No mass effect or midline shift. No hydrocephalus. Basilar cisterns are patent. Vascular: No hyperdense vessel. Atherosclerotic calcifications are present within the cavernous internal carotid and vertebral arteries. Skull: No acute fracture or focal lesion. Sinuses/Orbits: Paranasal sinuses and mastoid air cells are clear. The orbits are unremarkable. Other: None. IMPRESSION: No acute intracranial abnormality. Electronically Signed   By: Iven Finn M.D.   On: 04/07/2022 00:57   CT Abdomen Pelvis Wo Contrast  Result Date: 04/06/2022 CLINICAL DATA:  Fall 3 days ago. Chest and abdominal pain. History of prostate cancer. EXAM: CT ABDOMEN AND PELVIS WITHOUT CONTRAST TECHNIQUE: Multidetector CT imaging of the abdomen and pelvis was performed following the standard protocol without IV contrast. RADIATION DOSE REDUCTION: This exam was performed according to the departmental dose-optimization program which includes automated exposure control, adjustment of the mA and/or kV according to patient size and/or use of iterative reconstruction technique. COMPARISON:  05/04/2020 FINDINGS: Lower chest: Coronary and aortic atherosclerosis. Mild bibasilar scarring. Hepatobiliary: Unremarkable Pancreas: Unremarkable Spleen: Unremarkable Adrenals/Urinary Tract: Both adrenal glands appear normal. Fluid density left renal cysts. No further imaging workup of these lesions is indicated. 2 mm left mid upper kidney calculus. Subtle 5 by 2 by 5 mm density in the left mid kidney on image 31 series 9 is technically nonspecific although statistically likely to be a tiny complex cyst. Measured internal density about 46 Hounsfield units. Not readily apparent on MRI of 07/03/2020. Stomach/Bowel: Sigmoid colon diverticulosis. Vascular/Lymphatic:  Atherosclerosis is present, including aortoiliac atherosclerotic disease. No pathologic adenopathy. Reproductive: Fiducials noted along the fairly small volume prostate gland. Other: No supplemental non-categorized findings. Musculoskeletal: Left hip prosthesis. Posterior decompression at L4-5. 7 mm degenerative anterolisthesis at L3-4 with resulting bilateral foraminal impingement and central narrowing of the thecal sac. Multilevel lumbar impingement due to spondylosis and degenerative disc disease. Small umbilical hernia contains adipose tissue. IMPRESSION: 1. A specific cause for the patient's acute abdominal pain is not identified. 2. 2 mm nonobstructive left mid upper kidney calculus. 3. 5 by 2 by 5 mm density in the left mid kidney is technically nonspecific although statistically likely to be a tiny complex cyst. This is not readily apparent on MRI of 07/03/2020. If the patient has hematuria or if otherwise clinically warranted, renal protocol MRI with and without contrast could be utilized to further assess this lesion, although the small size the lesion may make definitive characterization difficult. 4. Multilevel lumbar impingement due to spondylosis and degenerative disc disease. 5. Sigmoid colon diverticulosis. 6. Small umbilical hernia contains adipose tissue. 7. Coronary and aortic atherosclerosis. Aortic Atherosclerosis (ICD10-I70.0). Electronically Signed   By: Van Clines M.D.   On: 04/06/2022 14:44    Procedures Procedures   CRITICAL CARE Performed by: Quintella Reichert   Total critical care time: 35 minutes  Critical care time was exclusive of separately billable procedures and treating other patients.  Critical care was  necessary to treat or prevent imminent or life-threatening deterioration.  Critical care was time spent personally by me on the following activities: development of treatment plan with patient and/or surrogate as well as nursing, discussions with consultants,  evaluation of patient's response to treatment, examination of patient, obtaining history from patient or surrogate, ordering and performing treatments and interventions, ordering and review of laboratory studies, ordering and review of radiographic studies, pulse oximetry and re-evaluation of patient's condition.  Medications Ordered in ED Medications  heparin ADULT infusion 100 units/mL (25000 units/245m) (950 Units/hr Intravenous New Bag/Given 04/07/22 0239)  sodium chloride (PF) 0.9 % injection (  Given by Other 04/07/22 0024)  iohexol (OMNIPAQUE) 350 MG/ML injection 100 mL (100 mLs Intravenous Contrast Given 04/07/22 0024)  labetalol (NORMODYNE) injection 10 mg (10 mg Intravenous Given 04/07/22 0236)  aspirin chewable tablet 324 mg (324 mg Oral Given 04/07/22 0226)  heparin bolus via infusion 4,000 Units (4,000 Units Intravenous Bolus from Bag 04/07/22 0244)    ED Course/ Medical Decision Making/ A&P                             Medical Decision Making Amount and/or Complexity of Data Reviewed Labs: ordered. Radiology: ordered.  Risk OTC drugs. Prescription drug management. Decision regarding hospitalization.   Patient with history of hypertension and peripheral arterial disease here for evaluation of elevated troponin on outpatient testing.  He did have chest pain earlier this week.  EKG is without acute ischemic changes.  Troponin is mildly elevated in the 70s, slightly uptrending from earlier troponin at PCPs office.  Patient is noted to be slightly tachycardic, hypertensive at time of ED arrival.  He does have a remote history of DVT but is not on anticoagulation.  He also has asymmetric pedal pulses.  He does have good perfusion in his right lower extremity, do not suspect acute right lower extremity ischemia.  Given his severe hypertension, tachycardia as well as elevated troponin a CTA dissection was obtained, which is negative for dissection or significant PE - images personally  reviewed and interpreted, agree with radiologist interpretation.  CT does demonstrate severe atherosclerosis.  Blood pressures continue to be elevated but did improve in the emergency department after labetalol administration.  Discussed with cardiologist on-call-recommends admission to medicine service[facility].  Concern for NSTEMI given his recent chest pain and severe atherosclerosis but patient also does have uncontrolled blood pressures, which may be contributing to troponin leak.  Medicine consulted for admission for ongoing care.  Patient was treated with aspirin for potential non-ST elevation MI as well as heparin drip.  Medicine consulted for admission.        Final Clinical Impression(s) / ED Diagnoses Final diagnoses:  NSTEMI (non-ST elevated myocardial infarction) ([facility]    Rx / DJacksonOrders ED Discharge Orders     None         RQuintella Reichert MD 04/07/22 [phone]

## 2022-04-06 NOTE — Progress Notes (Addendum)
Established Patient Office Visit  Subjective   Patient ID: Ronald Stafford, male    DOB: [birth year]  Age: 89 y.o. MRN: [id number]  Chief Complaint  Patient presents with   Chest Pain    Patient had a fall 3 days ago, witnessed by son. Did not hit head, was able to get up and go out to dinner. The next day experienced abdominal pain and chills.  Reports no appetite.  Over the last 2 days has been also experiencing constant epigastric chest pain.  Denies any history of heartburn.  Also having dizziness.  Consulted with patient's PCP, patient has frequent falls and history of dizziness in the past.  Patient denies any cough, but does report new intermittent shortness of breath.    Review of Systems  Constitutional:  Positive for chills.  Respiratory:  Positive for shortness of breath (intermittent). Negative for cough.   Cardiovascular:  Positive for chest pain.  Gastrointestinal:  Positive for abdominal pain (now subsided; epigastric). Negative for diarrhea, heartburn and vomiting.  Neurological:  Positive for dizziness.      Objective:     BP (!) 154/72   Pulse (!) 114   Temp 97.8 F (36.6 C) (Temporal)   Ht '6\' 2"'$  (1.88 m)   Wt 184 lb 6 oz (83.6 kg)   SpO2 94%   BMI 23.67 kg/m    Physical Exam Vitals reviewed.  Constitutional:      Appearance: Normal appearance.  HENT:     Head: Normocephalic and atraumatic.  Cardiovascular:     Rate and Rhythm: Normal rate and regular rhythm.  Pulmonary:     Effort: Pulmonary effort is normal.     Breath sounds: Normal breath sounds.  Abdominal:     Tenderness: There is abdominal tenderness in the right lower quadrant. There is no guarding or rebound.  Musculoskeletal:     Cervical back: Neck supple.  Skin:    General: Skin is warm and dry.  Neurological:     Mental Status: He is alert and oriented to person, place, and time.  Psychiatric:        Mood and Affect: Mood normal.        Behavior: Behavior normal.        Thought  Content: Thought content normal.        Judgment: Judgment normal.    EKG: Normal sinus rhythm no ST segment elevation, heart rate of 85  Results for orders placed or performed in visit on 04/06/22  POC COVID-19  Result Value Ref Range   SARS Coronavirus 2 Ag Negative Negative  POCT respiratory syncytial virus  Result Value Ref Range   RSV Rapid Ag negative   POCT Influenza A/B  Result Value Ref Range   Influenza A, POC Negative Negative   Influenza B, POC Negative Negative      The ASCVD Risk score (Arnett DK, et al., 2019) failed to calculate for the following reasons:   The 2019 ASCVD risk score is only valid for ages 72 to 35    Assessment & Plan:   Problem List Items Addressed This Visit       Other   Chest pain - Primary    Concern for possible ACS versus GERD versus acute appendicitis.  Recommended patient proceed to the emergency department, however patient is asking if any testing can be done outside of the emergency department.  We did discuss that doing outpatient workup can result in delayed diagnosis and treatment which may  result in poor outcomes including possible death.  Patient reports understanding and would prefer to still undergo evaluation in the outpatient setting.  Will order stat troponin as well as CBC, CMP, and stat abdominal and pelvis CT scan.  Further recommendations may be made based upon the results.  Patient educated and reports understanding that if symptoms worsen at all he needs to call 911.      Relevant Orders   Troponin I (High Sensitivity)   Comprehensive metabolic panel   CBC   Urinalysis with Culture, if indicated   POC COVID-19 (Completed)   POCT respiratory syncytial virus (Completed)   POCT Influenza A/B (Completed)   Abdominal pain    Concern for possible ACS versus GERD versus acute appendicitis.  Recommended patient proceed to the emergency department, however patient is asking if any testing can be done outside of the  emergency department.  We did discuss that doing outpatient workup can result in delayed diagnosis and treatment which may result in poor outcomes including possible death.  Patient reports understanding and would prefer to still undergo evaluation in the outpatient setting.  Will order stat troponin as well as CBC, CMP, and stat abdominal and pelvis CT scan.  Further recommendations may be made based upon the results.      Relevant Orders   Troponin I (High Sensitivity)   Comprehensive metabolic panel   CBC   Urinalysis with Culture, if indicated   CT Abdomen Pelvis Wo Contrast   POC COVID-19 (Completed)   POCT respiratory syncytial virus (Completed)   POCT Influenza A/B (Completed)    Return if symptoms worsen or fail to improve.  Total time spent on encounter was 37 minutes including face-to-face evaluation, chart review, consultation with supervising physician, and development/discussion of treatment plan.   Ailene Ards, NP

## 2022-04-06 NOTE — Assessment & Plan Note (Addendum)
Concern for possible ACS versus GERD versus acute appendicitis.  Recommended patient proceed to the emergency department, however patient is asking if any testing can be done outside of the emergency department.  We did discuss that doing outpatient workup can result in delayed diagnosis and treatment which may result in poor outcomes including possible death.  Patient reports understanding and would prefer to still undergo evaluation in the outpatient setting.  Will order stat troponin as well as CBC, CMP, and stat abdominal and pelvis CT scan.  Further recommendations may be made based upon the results.  Patient educated and reports understanding that if symptoms worsen at all he needs to call 911.

## 2022-04-07 ENCOUNTER — Encounter (HOSPITAL_COMMUNITY): Payer: Self-pay

## 2022-04-07 ENCOUNTER — Inpatient Hospital Stay (HOSPITAL_BASED_OUTPATIENT_CLINIC_OR_DEPARTMENT_OTHER): Payer: Medicare Other

## 2022-04-07 ENCOUNTER — Other Ambulatory Visit (HOSPITAL_COMMUNITY): Payer: Medicare Other

## 2022-04-07 ENCOUNTER — Emergency Department (HOSPITAL_COMMUNITY): Payer: Medicare Other

## 2022-04-07 DIAGNOSIS — I10 Essential (primary) hypertension: Secondary | ICD-10-CM | POA: Diagnosis not present

## 2022-04-07 DIAGNOSIS — Z87891 Personal history of nicotine dependence: Secondary | ICD-10-CM | POA: Diagnosis not present

## 2022-04-07 DIAGNOSIS — R7989 Other specified abnormal findings of blood chemistry: Secondary | ICD-10-CM | POA: Diagnosis present

## 2022-04-07 DIAGNOSIS — R29818 Other symptoms and signs involving the nervous system: Secondary | ICD-10-CM | POA: Diagnosis not present

## 2022-04-07 DIAGNOSIS — Z96642 Presence of left artificial hip joint: Secondary | ICD-10-CM | POA: Diagnosis not present

## 2022-04-07 DIAGNOSIS — E7849 Other hyperlipidemia: Secondary | ICD-10-CM

## 2022-04-07 DIAGNOSIS — I714 Abdominal aortic aneurysm, without rupture, unspecified: Secondary | ICD-10-CM | POA: Diagnosis not present

## 2022-04-07 DIAGNOSIS — I779 Disorder of arteries and arterioles, unspecified: Secondary | ICD-10-CM | POA: Diagnosis not present

## 2022-04-07 DIAGNOSIS — Z8673 Personal history of transient ischemic attack (TIA), and cerebral infarction without residual deficits: Secondary | ICD-10-CM | POA: Diagnosis not present

## 2022-04-07 DIAGNOSIS — R413 Other amnesia: Secondary | ICD-10-CM

## 2022-04-07 DIAGNOSIS — I739 Peripheral vascular disease, unspecified: Secondary | ICD-10-CM | POA: Diagnosis not present

## 2022-04-07 DIAGNOSIS — I160 Hypertensive urgency: Secondary | ICD-10-CM | POA: Diagnosis not present

## 2022-04-07 DIAGNOSIS — Z7902 Long term (current) use of antithrombotics/antiplatelets: Secondary | ICD-10-CM | POA: Diagnosis not present

## 2022-04-07 DIAGNOSIS — I214 Non-ST elevation (NSTEMI) myocardial infarction: Secondary | ICD-10-CM

## 2022-04-07 DIAGNOSIS — R42 Dizziness and giddiness: Secondary | ICD-10-CM | POA: Diagnosis not present

## 2022-04-07 DIAGNOSIS — I672 Cerebral atherosclerosis: Secondary | ICD-10-CM | POA: Diagnosis not present

## 2022-04-07 DIAGNOSIS — Z8546 Personal history of malignant neoplasm of prostate: Secondary | ICD-10-CM

## 2022-04-07 DIAGNOSIS — Z86718 Personal history of other venous thrombosis and embolism: Secondary | ICD-10-CM | POA: Diagnosis not present

## 2022-04-07 DIAGNOSIS — Z79899 Other long term (current) drug therapy: Secondary | ICD-10-CM | POA: Diagnosis not present

## 2022-04-07 LAB — CBC WITH DIFFERENTIAL/PLATELET
Abs Immature Granulocytes: 0.01 10*3/uL (ref 0.00–0.07)
Basophils Absolute: 0.1 10*3/uL (ref 0.0–0.1)
Basophils Relative: 2 %
Eosinophils Absolute: 0.1 10*3/uL (ref 0.0–0.5)
Eosinophils Relative: 1 %
HCT: 36.1 % — ABNORMAL LOW (ref 39.0–52.0)
Hemoglobin: 11.1 g/dL — ABNORMAL LOW (ref 13.0–17.0)
Immature Granulocytes: 0 %
Lymphocytes Relative: 24 %
Lymphs Abs: 1 10*3/uL (ref 0.7–4.0)
MCH: 27.8 pg (ref 26.0–34.0)
MCHC: 30.7 g/dL (ref 30.0–36.0)
MCV: 90.5 fL (ref 80.0–100.0)
Monocytes Absolute: 0.8 10*3/uL (ref 0.1–1.0)
Monocytes Relative: 19 %
Neutro Abs: 2.2 10*3/uL (ref 1.7–7.7)
Neutrophils Relative %: 54 %
Platelets: 104 10*3/uL — ABNORMAL LOW (ref 150–400)
RBC: 3.99 MIL/uL — ABNORMAL LOW (ref 4.22–5.81)
RDW: 15.4 % (ref 11.5–15.5)
WBC: 4 10*3/uL (ref 4.0–10.5)
nRBC: 0 % (ref 0.0–0.2)

## 2022-04-07 LAB — BASIC METABOLIC PANEL
Anion gap: 8 (ref 5–15)
BUN: 17 mg/dL (ref 8–23)
CO2: 28 mmol/L (ref 22–32)
Calcium: 8.8 mg/dL — ABNORMAL LOW (ref 8.9–10.3)
Chloride: 105 mmol/L (ref 98–111)
Creatinine, Ser: 0.86 mg/dL (ref 0.61–1.24)
GFR, Estimated: >60 mL/min (ref >60)
Glucose, Bld: 114 mg/dL — ABNORMAL HIGH (ref 70–99)
Potassium: 3.6 mmol/L (ref 3.5–5.1)
Sodium: 141 mmol/L (ref 135–145)

## 2022-04-07 LAB — ECHOCARDIOGRAM COMPLETE
AR max vel: 2.22 cm2
AV Area VTI: 2.29 cm2
AV Area mean vel: 2.19 cm2
AV Mean grad: 3 mmHg
AV Peak grad: 4.8 mmHg
Ao pk vel: 1.09 m/s
Area-P 1/2: 3.91 cm2
Height: 74 in
S' Lateral: 2.8 cm
Weight: 2880 oz

## 2022-04-07 LAB — URINALYSIS, ROUTINE W REFLEX MICROSCOPIC
Bilirubin Urine: NEGATIVE
Glucose, UA: NEGATIVE mg/dL
Hgb urine dipstick: NEGATIVE
Ketones, ur: NEGATIVE mg/dL
Nitrite: NEGATIVE
Protein, ur: NEGATIVE mg/dL
Specific Gravity, Urine: 1.016 (ref 1.005–1.030)
pH: 6 (ref 5.0–8.0)

## 2022-04-07 LAB — PROTIME-INR
INR: 1.1 (ref 0.8–1.2)
Prothrombin Time: 14.1 seconds (ref 11.4–15.2)

## 2022-04-07 LAB — LIPID PANEL
Cholesterol: 102 mg/dL (ref 0–200)
HDL: 37 mg/dL — ABNORMAL LOW (ref >40)
LDL Cholesterol: 54 mg/dL (ref 0–99)
Total CHOL/HDL Ratio: 2.8 RATIO
Triglycerides: 55 mg/dL (ref <150)
VLDL: 11 mg/dL (ref 0–40)

## 2022-04-07 LAB — MAGNESIUM: Magnesium: 1.7 mg/dL (ref 1.7–2.4)

## 2022-04-07 LAB — APTT: aPTT: 31 seconds (ref 24–36)

## 2022-04-07 LAB — TROPONIN I (HIGH SENSITIVITY): Troponin I (High Sensitivity): 71 ng/L — ABNORMAL HIGH (ref <18)

## 2022-04-07 MED ORDER — HEPARIN (PORCINE) 25000 UT/250ML-% IV SOLN
950.0000 [IU]/h | INTRAVENOUS | Status: DC
  Administered 2022-04-07: 950 [IU]/h via INTRAVENOUS
  Filled 2022-04-07: qty 250

## 2022-04-07 MED ORDER — METOPROLOL TARTRATE 5 MG/5ML IV SOLN
5.0000 mg | INTRAVENOUS | Status: DC | PRN
  Administered 2022-04-07: 5 mg via INTRAVENOUS
  Filled 2022-04-07: qty 5

## 2022-04-07 MED ORDER — ASPIRIN 81 MG PO CHEW
324.0000 mg | CHEWABLE_TABLET | Freq: Once | ORAL | Status: AC
  Administered 2022-04-07: 324 mg via ORAL
  Filled 2022-04-07: qty 4

## 2022-04-07 MED ORDER — CARVEDILOL 6.25 MG PO TABS: 6.2500 mg | ORAL_TABLET | Freq: Two times a day (BID) | ORAL | 2 refills | Status: DC

## 2022-04-07 MED ORDER — NITROGLYCERIN 0.4 MG SL SUBL: 0.4000 mg | SUBLINGUAL_TABLET | SUBLINGUAL | Status: DC | PRN

## 2022-04-07 MED ORDER — HEPARIN BOLUS VIA INFUSION
4000.0000 [IU] | Freq: Once | INTRAVENOUS | Status: AC
  Administered 2022-04-07: 4000 [IU] via INTRAVENOUS
  Filled 2022-04-07: qty 4000

## 2022-04-07 MED ORDER — ACETAMINOPHEN 325 MG PO TABS: 650.0000 mg | ORAL_TABLET | ORAL | Status: DC | PRN

## 2022-04-07 MED ORDER — ONDANSETRON HCL 4 MG/2ML IJ SOLN: 4.0000 mg | Freq: Four times a day (QID) | INTRAMUSCULAR | Status: DC | PRN

## 2022-04-07 MED ORDER — ATORVASTATIN CALCIUM 80 MG PO TABS
80.0000 mg | ORAL_TABLET | Freq: Every morning | ORAL | Status: DC
  Administered 2022-04-07: 80 mg via ORAL
  Filled 2022-04-07: qty 2

## 2022-04-07 MED ORDER — ASPIRIN 81 MG PO TBEC
81.0000 mg | DELAYED_RELEASE_TABLET | Freq: Every day | ORAL | Status: DC
  Administered 2022-04-07: 81 mg via ORAL
  Filled 2022-04-07: qty 1

## 2022-04-07 MED ORDER — IRBESARTAN 150 MG PO TABS
150.0000 mg | ORAL_TABLET | Freq: Every day | ORAL | Status: DC
  Administered 2022-04-07: 150 mg via ORAL
  Filled 2022-04-07: qty 1

## 2022-04-07 MED ORDER — IOHEXOL 350 MG/ML SOLN
100.0000 mL | Freq: Once | INTRAVENOUS | Status: AC | PRN
  Administered 2022-04-07: 100 mL via INTRAVENOUS

## 2022-04-07 MED ORDER — LABETALOL HCL 5 MG/ML IV SOLN
10.0000 mg | Freq: Once | INTRAVENOUS | Status: AC
  Administered 2022-04-07: 10 mg via INTRAVENOUS
  Filled 2022-04-07: qty 4

## 2022-04-07 MED ORDER — CLOPIDOGREL BISULFATE 75 MG PO TABS
75.0000 mg | ORAL_TABLET | Freq: Every morning | ORAL | Status: DC
  Administered 2022-04-07: 75 mg via ORAL
  Filled 2022-04-07 (×2): qty 1

## 2022-04-07 MED ORDER — CARVEDILOL 6.25 MG PO TABS
6.2500 mg | ORAL_TABLET | Freq: Two times a day (BID) | ORAL | Status: DC
  Administered 2022-04-07: 6.25 mg via ORAL
  Filled 2022-04-07: qty 2

## 2022-04-07 NOTE — Care Management CC44 (Signed)
Condition Code 44 Documentation Completed  Patient Details  Name: Ronald Stafford MRN: [id number] Date of Birth: 12/08/1930   Condition Code 44 given:  Yes Patient signature on Condition Code 44 notice:  Yes Documentation of 2 MD's agreement:  Yes Code 44 added to claim:  Yes    Bethena Roys, RN 04/07/2022, 3:30 PM

## 2022-04-07 NOTE — Consult Note (Signed)
Cardiology Consultation   Patient ID: THOR NANNINI MRN: [id number]; DOB: 12/08/1930  Admit date: 04/06/2022 Date of Consult: 04/07/2022  PCP:  Binnie Rail, MD   [place] Providers Cardiologist:  None        Patient Profile:   Ronald Stafford is a 89 y.o. male with a hx of DVT (remote), hyperlipidemia, PAD, TIA, hypertension, and prostate cancer who is being seen 04/07/2022 for the evaluation of chest pain and elevated troponin at the request of Dr. Claria Dice.  History of Present Illness:   Mr. Willetts presented to the [facility] ED on 1/25 after being advised of elevated troponin by his PCP. Patient had been seen earlier in the day by Jeralyn Ruths, NP following a fall that occurred on Monday 1/22. This was a witnessed fall in which patient fell onto his left side after he lost his balance walking to his son's car, no head impact. He was able to get up after the fall and felt well enough to go out to dinner. The next day, patient reports that he had brief but severe upper abdominal pain with accompanying chills.  This occurred spontaneously while patient was sitting and watching TV.  Patient says that when this pain occurred again briefly on Wednesday, 1/24, he reached out to his PCP.  He says that he has not experienced any recurrence of this pain since Wednesday.  This morning in the emergency department, patient says that he feels completely at his baseline, and he is without chest pain, shortness of breath, palpitations.  Patient is very active at home, still works out in his yard on a frequent basis.  He denies any change to his exertional tolerance and has never experienced chest pain or shortness of breath while exerting himself.  Patient's history is notable for several recent falls.  By patient's description of falls, appear to be primarily mechanical.  Example: says that he fell backwards while dragging limbs to the curb this past summer.  Patient does have a history of DVT, says  that this followed a surgery in the late 1990s.  He was anticoagulated for a period of time, and then discontinued.  No recurrence of DVT.  Initial workup in the ED notable for significant hypertension, 193/95. Troponin 72->71. Labs otherwise unremarkable. CTA chest/abd/pelvis notable for 2 vessel coronary calcifications.  Past Medical History:  Diagnosis Date   Cancer [facility]) 2004   prostate cancer, Dr. Risa Grill   Colon polyps    Deep venous thrombosis ([place]) 1997   Postop   Depression    GERD (gastroesophageal reflux disease)    Hyperlipidemia    Hypertension    Radial head fracture, closed 09/18/2013   Shoulder fracture, left 02/23/2011   immobilized in sling , Dr Norris(no surgery)    Past Surgical History:  Procedure Laterality Date   COLONOSCOPY  2006   Negative,Dr.Edwards   colonoscopy with polypectomy      PMH of   ESOPHAGOGASTRODUODENOSCOPY N/A 02/23/2017   Procedure: ESOPHAGOGASTRODUODENOSCOPY (EGD);  Surgeon: Otis Brace, MD;  Location: [facility];  Service: Gastroenterology;  Laterality: N/A;   JOINT REPLACEMENT  1997, 2001   Total Hip replacement X 2   LUMBAR SPINE SURGERY  09/28/2010   Dr Rolena Infante; for spinal stenosis   MECKEL DIVERTICULUM EXCISION     Age 62   PROSTATECTOMY  2004   w/ radiation, Dr Risa Grill   TOTAL HIP REVISION Left 02/14/2017   Procedure: Left hip polyethylene EXCHANGE;  Surgeon: Gaynelle Arabian,  MD;  Location: [facility];  Service: Orthopedics;  Laterality: Left;   VASECTOMY       Home Medications:  Prior to Admission medications   Medication Sig Start Date End Date Taking? Authorizing Provider  acetaminophen (TYLENOL) 500 MG tablet Take 1,000 mg by mouth every 6 (six) hours as needed for mild pain.   Yes [provider]  atorvastatin (LIPITOR) 40 MG tablet TAKE ONE TABLET BY MOUTH EVERY MORNING Patient taking differently: Take 40 mg by mouth daily. 11/15/21  Yes Burns, Claudina Lick, MD  clopidogrel (PLAVIX) 75 MG tablet TAKE ONE TABLET BY  MOUTH EVERY MORNING Patient taking differently: Take 75 mg by mouth daily. 02/15/22  Yes Burns, Claudina Lick, MD  ipratropium-albuterol (DUONEB) 0.5-2.5 (3) MG/3ML SOLN Take 3 mLs by nebulization every 6 (six) hours as needed (shortness of breath).   Yes [provider]  meclizine (ANTIVERT) 12.5 MG tablet Take 1 tablet (12.5 mg total) by mouth 3 (three) times daily as needed for dizziness. 12/30/21  Yes Burns, Claudina Lick, MD  mirabegron ER (MYRBETRIQ) 25 MG TB24 tablet Take 1 tablet (25 mg total) by mouth daily as needed. Patient taking differently: Take 25 mg by mouth daily as needed (bladder control). 07/04/21  Yes Burns, Claudina Lick, MD  Omega-3 Fatty Acids (FISH OIL) 500 MG CAPS Take 500 mg by mouth daily.   Yes [provider]  valsartan (DIOVAN) 80 MG tablet Take 1 tablet (80 mg total) by mouth daily. 01/11/22  Yes Sagardia, Ines Bloomer, MD  vitamin C (ASCORBIC ACID) 250 MG tablet Take 250 mg by mouth daily.   Yes [provider]    Inpatient Medications: Scheduled Meds:  aspirin EC  81 mg Oral Daily   atorvastatin  80 mg Oral q morning   carvedilol  6.25 mg Oral BID WC   clopidogrel  75 mg Oral q morning   irbesartan  150 mg Oral Daily   Continuous Infusions:  heparin 950 Units/hr (04/07/22 0239)   PRN Meds: acetaminophen, metoprolol tartrate, nitroGLYCERIN, ondansetron (ZOFRAN) IV  Allergies:    Allergies  Allergen Reactions   Chicken Allergy Nausea And Vomiting and Other (See Comments)    Digestive problems    Social History:   Social History   Socioeconomic History   Marital status: Widowed    Spouse name: Not on file   Number of children: 2   Years of education: Not on file   Highest education level: Not on file  Occupational History   Occupation: retired  Tobacco Use   Smoking status: Former    Packs/day: 0.50    Years: 5.00    Total pack years: 2.50    Types: Cigarettes    Quit date: 03/13/1988    Years since quitting: 34.0   Smokeless  tobacco: Former   Tobacco comments:    Quit at about age 89 / chewed tobacca x 1 year  Vaping Use   Vaping Use: Never used  Substance and Sexual Activity   Alcohol use: No    Alcohol/week: 0.0 standard drinks of alcohol   Drug use: No   Sexual activity: Never  Other Topics Concern   Not on file  Social History Narrative   Lives alone; Wife passed 21-Dec-2012;   Died of ovarian cancer; breast cancer; other cancer;      Social Determinants of Health   Financial Resource Strain: Low Risk  (08/26/2021)   Overall Financial Resource Strain (CARDIA)    Difficulty of Paying  Living Expenses: Not hard at all  Food Insecurity: No Food Insecurity (08/26/2021)   Hunger Vital Sign    Worried About Running Out of Food in the Last Year: Never true    Ran Out of Food in the Last Year: Never true  Transportation Needs: No Transportation Needs (08/26/2021)   PRAPARE - Hydrologist (Medical): No    Lack of Transportation (Non-Medical): No  Physical Activity: Insufficiently Active (08/26/2021)   Exercise Vital Sign    Days of Exercise per Week: 3 days    Minutes of Exercise per Session: 30 min  Stress: No Stress Concern Present (08/26/2021)   [place]    Feeling of Stress : Not at all  Social Connections: Moderately Isolated (08/26/2021)   Social Connection and Isolation Panel [NHANES]    Frequency of Communication with Friends and Family: Three times a week    Frequency of Social Gatherings with Friends and Family: Three times a week    Attends Religious Services: More than 4 times per year    Active Member of Clubs or Organizations: No    Attends Archivist Meetings: Never    Marital Status: Widowed  Intimate Partner Violence: Not At Risk (08/26/2021)   Humiliation, Afraid, Rape, and Kick questionnaire    Fear of Current or Ex-Partner: No    Emotionally Abused: No    Physically Abused:  No    Sexually Abused: No    Family History:    Family History  Problem Relation Age of Onset   Prostate cancer Father    Testicular cancer Son      ROS:  Please see the history of present illness.   All other ROS reviewed and negative.     Physical Exam/Data:   Vitals:   04/07/22 0500 04/07/22 0515 04/07/22 0530 04/07/22 0830  BP: 115/63 (!) 107/59 124/62 127/74  Pulse: 69 66 71 65  Resp: '14 12 13 13  '$ Temp:   98 F (36.7 C)   TempSrc:   Oral   SpO2: 95% 97% 94% 96%  Weight:        Intake/Output Summary (Last 24 hours) at 04/07/2022 0907 Last data filed at 04/07/2022 0416 Gross per 24 hour  Intake --  Output 450 ml  Net -450 ml      04/06/2022    9:54 PM 04/06/2022   11:32 AM 01/11/2022    9:37 AM  Last 3 Weights  Weight (lbs) 180 lb 184 lb 6 oz 178 lb 8 oz  Weight (kg) 81.647 kg 83.632 kg 80.967 kg     Body mass index is 23.11 kg/m.  General:  Well nourished, well developed, in no acute distress HEENT: normal Neck: no JVD Vascular: No carotid bruits; Distal pulses 2+ bilaterally Cardiac:  normal S1, S2; RRR; no murmur  Lungs:  clear to auscultation bilaterally, no wheezing, rhonchi or rales  Abd: soft, nontender, no hepatomegaly  Ext: no edema Musculoskeletal:  No deformities, BUE and BLE strength normal and equal Skin: warm and dry  Neuro:  CNs 2-12 intact, no focal abnormalities noted Psych:  Normal affect   EKG:  The EKG was personally reviewed and demonstrates:  sinus rhythm Telemetry:  Telemetry was personally reviewed and demonstrates:  sinus rhythm  Relevant CV Studies:  07/26/2016 TTE  Study Conclusions   - Left ventricle: The cavity size was normal. Systolic function was    normal. The estimated ejection fraction  was in the range of 55%    to 60%. Wall motion was normal; there were no regional wall    motion abnormalities.  - Atrial septum: No defect or patent foramen ovale was identified.    -------------------------------------------------------------------  Study data:  No prior study was available for comparison.  Study  status:  Routine.  Procedure:  The patient reported no pain pre or  post test. Transthoracic echocardiography. Image quality was  adequate.  Study completion:  There were no complications.  Transthoracic echocardiography.  M-mode, complete 2D, spectral  Doppler, and color Doppler.  Birthdate:  Patient birthdate:  [birth year].  Age:  Patient is 89 yr old.  Sex:  Gender: male.  BMI: 23.6 kg/m^2.  Blood pressure:     112/52  Patient status:  Outpatient.  Study date:  Study date: 07/26/2016. Study time: 03:27  PM.  Location:  [facility]   -------------------------------------------------------------------   -------------------------------------------------------------------  Left ventricle:  The cavity size was normal. Systolic function was  normal. The estimated ejection fraction was in the range of 55% to  60%. Wall motion was normal; there were no regional wall motion  abnormalities.   -------------------------------------------------------------------  Aortic valve:   Trileaflet; normal thickness leaflets. Mobility was  not restricted.  Doppler:  Transvalvular velocity was within the  normal range. There was no stenosis. There was no regurgitation.    -------------------------------------------------------------------  Aorta: The aorta was normal, not dilated, and non-diseased. Aortic  root: The aortic root was normal in size.   -------------------------------------------------------------------  Mitral valve:   Mildly thickened leaflets . Mobility was not  restricted.  Doppler:  Transvalvular velocity was within the normal  range. There was no evidence for stenosis. There was trivial  regurgitation.    Valve area by pressure half-time: 3.61 cm^2.  Indexed valve area by pressure half-time: 1.73 cm^2/m^2.    -------------------------------------------------------------------  Left atrium:  The atrium was normal in size.   -------------------------------------------------------------------  Atrial septum:  No defect or patent foramen ovale was identified.    -------------------------------------------------------------------  Right ventricle:  The cavity size was normal. Wall thickness was  normal. Systolic function was normal.   -------------------------------------------------------------------  Pulmonic valve:    Doppler:  Transvalvular velocity was within the  normal range. There was no evidence for stenosis. There was trivial  regurgitation.   -------------------------------------------------------------------  Tricuspid valve:   Structurally normal valve.    Doppler:  Transvalvular velocity was within the normal range. There was mild  regurgitation.   -------------------------------------------------------------------  Pulmonary artery:   The main pulmonary artery was normal-sized.  Systolic pressure was within the normal range.   -------------------------------------------------------------------  Right atrium:  The atrium was normal in size.   -------------------------------------------------------------------  Pericardium: The pericardium was normal in appearance. There was  no pericardial effusion.   -------------------------------------------------------------------  Systemic veins:  Inferior vena cava: The vessel was normal in size. The  respirophasic diameter changes were in the normal range (>= 50%),  consistent with normal central venous pressure.   -------------------------------------------------------------------  Post procedure conclusions  Ascending Aorta:   - The aorta was normal, not dilated, and non-diseased.   Laboratory Data:  High Sensitivity Troponin:   Recent Labs  Lab 04/06/22 2150 04/06/22 2355  TROPONINIHS 72* 71*     Chemistry Recent  Labs  Lab 04/06/22 1211 04/06/22 2150 04/07/22 0517  NA 144 141 141  K 4.4 3.5 3.6  CL 106 107 105  CO2 '31 25 28  '$ GLUCOSE 101* 144* 114*  BUN  $'16 18 17  'K$ CREATININE 0.86 1.02 0.86  CALCIUM 9.8 9.2 8.8*  MG  --   --  1.7  GFRNONAA  --  >60 >60  ANIONGAP  --  9 8    Recent Labs  Lab 04/06/22 1211  PROT 7.0  ALBUMIN 4.3  AST 27  ALT 27  ALKPHOS 60  BILITOT 0.8   Lipids  Recent Labs  Lab 04/07/22 0517  CHOL 102  TRIG 55  HDL 37*  LDLCALC 54  CHOLHDL 2.8    Hematology Recent Labs  Lab 04/06/22 1211 04/06/22 2150 04/07/22 0517  WBC 4.7 3.7* 4.0  RBC 4.53 4.33 3.99*  HGB 12.8* 12.3* 11.1*  HCT 39.2 39.4 36.1*  MCV 86.7 91.0 90.5  MCH  --  28.4 27.8  MCHC 32.6 31.2 30.7  RDW 16.6* 15.3 15.4  PLT 110.0* 106* 104*   Thyroid No results for input(s): "TSH", "FREET4" in the last 168 hours.  BNPNo results for input(s): "BNP", "PROBNP" in the last 168 hours.  DDimer No results for input(s): "DDIMER" in the last 168 hours.   Radiology/Studies:  CT Angio Chest/Abd/Pel for Dissection W and/or W/WO  Result Date: 04/07/2022 CLINICAL DATA:  Acute aortic syndrome (AAS) suspected CP/AP, elevated troponin. Pt does have hx/o DVT - please try to bolus contrast to eval for major central PE if possible as well. EXAM: CT ANGIOGRAPHY CHEST, ABDOMEN AND PELVIS TECHNIQUE: Non-contrast CT of the chest was initially obtained. Multidetector CT imaging through the chest, abdomen and pelvis was performed using the standard protocol during bolus administration of intravenous contrast. Multiplanar reconstructed images and MIPs were obtained and reviewed to evaluate the vascular anatomy. RADIATION DOSE REDUCTION: This exam was performed according to the departmental dose-optimization program which includes automated exposure control, adjustment of the mA and/or kV according to patient size and/or use of iterative reconstruction technique. CONTRAST:  112m OMNIPAQUE IOHEXOL 350 MG/ML SOLN  COMPARISON:  CT abdomen pelvis 04/06/2022 FINDINGS: CTA CHEST FINDINGS Cardiovascular: Preferential opacification of the thoracic aorta. No evidence of thoracic aortic aneurysm or dissection. Normal heart size. No significant pericardial effusion. Severe atherosclerotic plaque of the thoracic aorta. Two vessel coronary artery calcifications. The main pulmonary artery is normal in caliber. No central or segmental pulmonary embolus. Mediastinum/Nodes: Right hilar lymph node enlarged measuring up to 1.5 cm. No left hilar lymphadenopathy. No enlarged mediastinal, hilar, or axillary lymph nodes. Thyroid gland, trachea, and esophagus demonstrate no significant findings. Lungs/Pleura: At least mild paraseptal and moderate central emphysematous changes. No focal consolidation. Subpleural 8 mm nodule within right lower lobe. No pulmonary mass. No pleural effusion. No pneumothorax. Musculoskeletal: No chest wall abnormality. No suspicious lytic or blastic osseous lesions. No acute displaced fracture. Multilevel degenerative changes of the spine. Bilateral shoulder degenerative changes. Review of the MIP images confirms the above findings. CTA ABDOMEN AND PELVIS FINDINGS VASCULAR Aorta: Normal caliber aorta without aneurysm, dissection, vasculitis or significant stenosis. 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 Distally the celiac opacified and is slightly dilated. Associated collaterals. Otherwise the spleen patent without evidence of aneurysm, dissection, vasculitis or significant stenosis. SMA: Severe narrowing of the origin and proximal superior mesenteric artery with opacification noted distally. Tortuosity of the distal superior mesenteric artery. No dissection or aneurysm. Renals: Both renal arteries are patent without evidence of aneurysm, dissection, vasculitis, fibromuscular dysplasia or significant stenosis. IMA: Patent without evidence of  aneurysm, dissection, vasculitis or significant stenosis. Inflow: Patent without evidence  of aneurysm, dissection, vasculitis or significant stenosis. Veins: No obvious venous abnormality within the limitations of this arterial phase study. Review of the MIP images confirms the above findings. NON-VASCULAR Hepatobiliary: No focal liver abnormality. No gallstones, gallbladder wall thickening, or pericholecystic fluid. No biliary dilatation. Pancreas: No focal lesion. Normal pancreatic contour. No surrounding inflammatory changes. No main pancreatic ductal dilatation. Spleen: Normal in size without focal abnormality. Adrenals/Urinary Tract: No adrenal nodule bilaterally. Bilateral kidneys enhance symmetrically. Fluid density lesions likely represent simple renal cysts. Simple renal cysts, in the absence of clinically indicated signs/symptoms, require no independent follow-up. Subcentimeter hypodensities are too small to characterize-no further follow-up indicated. No hydronephrosis. No hydroureter. The urinary bladder is unremarkable. Stomach/Bowel: Stomach is within normal limits. No evidence of bowel wall thickening or dilatation. Pneumatosis. Colonic diverticulosis. The appendix is not definitely identified with no inflammatory changes in the right lower quadrant to suggest acute appendicitis. Lymphatic: No lymphadenopathy. Reproductive: Prostate is unremarkable. Other: No intraperitoneal free fluid. No intraperitoneal free gas. No organized fluid collection. Musculoskeletal: No abdominal wall hernia or abnormality. No suspicious lytic or blastic osseous lesions. No acute displaced fracture. Multilevel degenerative changes of the spine. Grade 1 anterolisthesis of L3 on L4. L4 laminectomy. Total left hip arthroplasty partially visualized. Review of the MIP images confirms the above findings. IMPRESSION: 1. No acute thoracic or abdominal aorta abnormality. 2. Aortic Atherosclerosis (ICD10-I70.0) - severe with severe  (likely chronic) narrowing of the origin/proximal celiac and superior mesenteric arteries with opacification distally. No findings to suggest associated ischemia of the abdominal or pelvic organs. 3. Tortuosity of the distal superior mesenteric artery. 4. Right lower lobe 8 mm subpleural nodule. Non-contrast chest CT at 6-12 months is recommended. If the nodule is stable at time of repeat CT, then future CT at 18-24 months (from today's scan) is considered optional for low-risk patients, but is recommended for high-risk patients. This recommendation follows the consensus statement: Guidelines for Management of Incidental Pulmonary Nodules Detected on CT Images: From the Fleischner Society 2017; Radiology 2017; 284:228-243. 5. Nonspecific 1.5 cm right hilar enlarged lymph node. Attention on follow-up. 6. Colonic diverticulosis with no acute diverticulitis. 7. Emphysema (ICD10-J43.9). Electronically Signed   By: Iven Finn M.D.   On: 04/07/2022 01:16   CT Head Wo Contrast  Result Date: 04/07/2022 CLINICAL DATA:  Neuro deficit, acute, stroke suspected vertigo EXAM: CT HEAD WITHOUT CONTRAST TECHNIQUE: Contiguous axial images were obtained from the base of the skull through the vertex without intravenous contrast. RADIATION DOSE REDUCTION: This exam was performed according to the departmental dose-optimization program which includes automated exposure control, adjustment of the mA and/or kV according to patient size and/or use of iterative reconstruction technique. COMPARISON:  CT head 10/31/2019 FINDINGS: Brain: Cerebral ventricle sizes are concordant with the degree of cerebral volume loss. Patchy and confluent areas of decreased attenuation are noted throughout the deep and periventricular white matter of the cerebral hemispheres bilaterally, compatible with chronic microvascular ischemic disease. No evidence of large-territorial acute infarction. No parenchymal hemorrhage. No mass lesion. No extra-axial  collection. No mass effect or midline shift. No hydrocephalus. Basilar cisterns are patent. Vascular: No hyperdense vessel. Atherosclerotic calcifications are present within the cavernous internal carotid and vertebral arteries. Skull: No acute fracture or focal lesion. Sinuses/Orbits: Paranasal sinuses and mastoid air cells are clear. The orbits are unremarkable. Other: None. IMPRESSION: No acute intracranial abnormality. Electronically Signed   By: Iven Finn M.D.   On: 04/07/2022 00:57   CT Abdomen Pelvis Wo Contrast  Result  Date: 04/06/2022 CLINICAL DATA:  Fall 3 days ago. Chest and abdominal pain. History of prostate cancer. EXAM: CT ABDOMEN AND PELVIS WITHOUT CONTRAST TECHNIQUE: Multidetector CT imaging of the abdomen and pelvis was performed following the standard protocol without IV contrast. RADIATION DOSE REDUCTION: This exam was performed according to the departmental dose-optimization program which includes automated exposure control, adjustment of the mA and/or kV according to patient size and/or use of iterative reconstruction technique. COMPARISON:  05/04/2020 FINDINGS: Lower chest: Coronary and aortic atherosclerosis. Mild bibasilar scarring. Hepatobiliary: Unremarkable Pancreas: Unremarkable Spleen: Unremarkable Adrenals/Urinary Tract: Both adrenal glands appear normal. Fluid density left renal cysts. No further imaging workup of these lesions is indicated. 2 mm left mid upper kidney calculus. Subtle 5 by 2 by 5 mm density in the left mid kidney on image 31 series 9 is technically nonspecific although statistically likely to be a tiny complex cyst. Measured internal density about 46 Hounsfield units. Not readily apparent on MRI of 07/03/2020. Stomach/Bowel: Sigmoid colon diverticulosis. Vascular/Lymphatic: Atherosclerosis is present, including aortoiliac atherosclerotic disease. No pathologic adenopathy. Reproductive: Fiducials noted along the fairly small volume prostate gland. Other: No  supplemental non-categorized findings. Musculoskeletal: Left hip prosthesis. Posterior decompression at L4-5. 7 mm degenerative anterolisthesis at L3-4 with resulting bilateral foraminal impingement and central narrowing of the thecal sac. Multilevel lumbar impingement due to spondylosis and degenerative disc disease. Small umbilical hernia contains adipose tissue. IMPRESSION: 1. A specific cause for the patient's acute abdominal pain is not identified. 2. 2 mm nonobstructive left mid upper kidney calculus. 3. 5 by 2 by 5 mm density in the left mid kidney is technically nonspecific although statistically likely to be a tiny complex cyst. This is not readily apparent on MRI of 07/03/2020. If the patient has hematuria or if otherwise clinically warranted, renal protocol MRI with and without contrast could be utilized to further assess this lesion, although the small size the lesion may make definitive characterization difficult. 4. Multilevel lumbar impingement due to spondylosis and degenerative disc disease. 5. Sigmoid colon diverticulosis. 6. Small umbilical hernia contains adipose tissue. 7. Coronary and aortic atherosclerosis. Aortic Atherosclerosis (ICD10-I70.0). Electronically Signed   By: Van Clines M.D.   On: 04/06/2022 14:44     Assessment and Plan:  KAVISH LAFITTE is a 89 y.o. male with a hx of DVT (remote), hyperlipidemia, PAD, TIA, hypertension, and prostate cancer who is being seen 04/07/2022 for the evaluation of chest pain and elevated troponin at the request of Dr. Claria Dice.   Atypical chest pain Elevated troponin Hyperlipidemia  Patient with 2 isolated episodes of epigastric pain, first occurring the day after a mechanical fall with a second occurrence on Wednesday 1/24. Troponin checked by PCP yesterday, elevated to 68 and patient sent to the ED. Repeat troponins here have been 72->71.  Patient is highly functional at baseline and is tolerant of fairly significant exertion without  difficulty.  CT angiography chest/abdomen/pelvis noted two-vessel coronary artery calcifications.  Given flat troponin, reassuring ECG, and rather atypical symptoms, I do not believe aggressive ischemic evaluation is warranted at this time.  Agree with assessing for wall motion abnormalities on echocardiogram.  If this imaging appears benign, would consider Lexiscan stress test inpatient vs outpatient.  If echo abnormal, given patient's history of PAD, would be more likely to consider heart catheterization. Continue heparin infusion for now, though overall low suspicion for ACS at this time.  Can stop if his echocardiogram looks benign. Continue Plavix 75 mg daily Continue atorvastatin 40 mg daily  Hypertension  Patient was initially quite hypertensive on presentation to the ED, with systolic BP as high as 256 mmHg.  His blood pressure is now within normal range after receiving a irbesartan overnight.  Patient does not routinely check his blood pressures at home, possible that his elevated troponin is secondary to hypertension induced demand ischemia.  Continue irbesartan 150 mg once daily Given patient's increased fall frequency, would avoid beta-blockers.  PAD  Continue daily plavix.  Risk Assessment/Risk Scores:     TIMI Risk Score for Unstable Angina or Non-ST Elevation MI:   The patient's TIMI risk score is 3, which indicates a 13% risk of all cause mortality, new or recurrent myocardial infarction or need for urgent revascularization in the next 14 days.          For questions or updates, please contact [place] Please consult [email or web address] for contact info under    Signed, Lily Kocher, PA-C  04/07/2022 9:07 AM

## 2022-04-07 NOTE — Progress Notes (Signed)
Discharge instructions reviewed with pt and his son.  Copy of instructions given to pt. Informed new med script sent to his pharmacy for pick up. Pt d/c'd via wheelchair with belongings, with his son.          Escorted by staff.

## 2022-04-07 NOTE — Progress Notes (Signed)
ANTICOAGULATION CONSULT NOTE - Initial Consult  Pharmacy Consult for heparin Indication: chest pain/ACS  Allergies  Allergen Reactions   Chicken Allergy Nausea And Vomiting and Other (See Comments)    Digestive problems    Patient Measurements: Weight: 81.6 kg (180 lb) Heparin Dosing Weight: 81.6kg  Vital Signs: Temp: 98 F (36.7 C) (01/25 2146) Temp Source: Oral (01/25 2146) BP: 174/70 (01/26 0015) Pulse Rate: 79 (01/26 0015)  Labs: Recent Labs    04/06/22 1211 04/06/22 2150 04/06/22 2355  HGB 12.8* 12.3*  --   HCT 39.2 39.4  --   PLT 110.0* 106*  --   CREATININE 0.86 1.02  --   TROPONINIHS  --  72* 71*    Estimated Creatinine Clearance: 56.7 mL/min (by C-G formula based on SCr of 1.02 mg/dL).   Medical History: Past Medical History:  Diagnosis Date   Cancer [facility]) 2004   prostate cancer, Dr. Risa Grill   Colon polyps    Deep venous thrombosis ([place]) 1997   Postop   Depression    GERD (gastroesophageal reflux disease)    Hyperlipidemia    Hypertension    Radial head fracture, closed 09/18/2013   Shoulder fracture, left 02/23/2011   immobilized in sling , Dr Norris(no surgery)     Assessment: 89 yo male presents with hypertension and dizziness, seen by PCP this afternoon and directed to go the ER for elevated troponin.  Pharmacy to dose heparin drip to ACS/STEMI.  No prior AC noted  Hgb 12.3, Plts 106, trop 71, Scr 1.02  Goal of Therapy:  Heparin level 0.3-0.7 units/ml Monitor platelets by anticoagulation protocol: Yes   Plan:  Baseline labs STAT Heparin bolus 4000 units x 1 Start heparin drip at 950 units/hr Heparin level in 8 hours Daily CBC  Dolly Rias RPh 04/07/2022, 1:40 AM

## 2022-04-07 NOTE — H&P (Signed)
PCP:   Binnie Rail, MD   Chief Complaint:  Elevated troponin  HPI: This is a 89 year old male with past medical history of DVT, hyperlipidemia, PAD, history of TIA, hypertension and prostate cancer.  On Monday he fell on his left side.  On Tuesday he had an episode of severe left-sided chest pains that lasted for few minutes.  He had no other symptomatology.  He was watching TV at the time.  He had no shortness of breath, no palpitations, no nausea and no radiation of his pain.  Because this was so different and unusual he made an appointment to see his PCP.  Blood work done showed a troponin of 60.  She directed him to the ER.  Our ER patient's troponin is 70.  His blood pressure is uncontrolled at 193/95.  Patient has cardiac risk factors include PAD, hyperlipidemia, hypertension and history of TIA.  Cardiology was consulted by EDP, rec recommendation admit at [facility] with cardiology consult.   Review of Systems:  The patient denies anorexia, fever, weight loss,, vision loss, decreased hearing, hoarseness, chest pain, syncope, dyspnea on exertion, peripheral edema, balance deficits, hemoptysis, abdominal pain, melena, hematochezia, severe indigestion/heartburn, hematuria, incontinence, genital sores, muscle weakness, suspicious skin lesions, transient blindness, difficulty walking, depression, unusual weight change, abnormal bleeding, enlarged lymph nodes, angioedema, and breast masses. Positive: Chest pain  Past Medical History: Past Medical History:  Diagnosis Date   Cancer [facility]) 2004   prostate cancer, Dr. Risa Grill   Colon polyps    Deep venous thrombosis ([place]) 1997   Postop   Depression    GERD (gastroesophageal reflux disease)    Hyperlipidemia    Hypertension    Radial head fracture, closed 09/18/2013   Shoulder fracture, left 02/23/2011   immobilized in sling , Dr Norris(no surgery)   Past Surgical History:  Procedure Laterality Date   COLONOSCOPY  2006    Negative,Dr.Edwards   colonoscopy with polypectomy      PMH of   ESOPHAGOGASTRODUODENOSCOPY N/A 02/23/2017   Procedure: ESOPHAGOGASTRODUODENOSCOPY (EGD);  Surgeon: Otis Brace, MD;  Location: [facility];  Service: Gastroenterology;  Laterality: N/A;   JOINT REPLACEMENT  1997, 2001   Total Hip replacement X 2   LUMBAR SPINE SURGERY  09/28/2010   Dr Rolena Infante; for spinal stenosis   MECKEL DIVERTICULUM EXCISION     Age 72   PROSTATECTOMY  2004   w/ radiation, Dr Risa Grill   TOTAL HIP REVISION Left 02/14/2017   Procedure: Left hip polyethylene EXCHANGE;  Surgeon: Gaynelle Arabian, MD;  Location: [facility];  Service: Orthopedics;  Laterality: Left;   VASECTOMY      Medications: Prior to Admission medications   Medication Sig Start Date End Date Taking? Authorizing Provider  atorvastatin (LIPITOR) 40 MG tablet TAKE ONE TABLET BY MOUTH EVERY MORNING 11/15/21   Binnie Rail, MD  clopidogrel (PLAVIX) 75 MG tablet TAKE ONE TABLET BY MOUTH EVERY MORNING 02/15/22   Burns, Claudina Lick, MD  meclizine (ANTIVERT) 12.5 MG tablet Take 1 tablet (12.5 mg total) by mouth 3 (three) times daily as needed for dizziness. 12/30/21   Binnie Rail, MD  mirabegron ER (MYRBETRIQ) 25 MG TB24 tablet Take 1 tablet (25 mg total) by mouth daily as needed. 07/04/21   Binnie Rail, MD  Omega-3 Fatty Acids (FISH OIL) 500 MG CAPS Take 1 capsule by mouth daily.    [provider]  valsartan (DIOVAN) 80 MG tablet Take 1 tablet (80 mg total) by mouth daily. 01/11/22  Horald Pollen, MD  vitamin C (ASCORBIC ACID) 250 MG tablet Take 250 mg by mouth daily.    [provider]    Allergies:   Allergies  Allergen Reactions   Chicken Allergy Nausea And Vomiting and Other (See Comments)    Digestive problems    Social History:  reports that he quit smoking about 34 years ago. His smoking use included cigarettes. He has a 2.50 pack-year smoking history. He has quit using smokeless tobacco. He reports that he  does not drink alcohol and does not use drugs.  Family History: Family History  Problem Relation Age of Onset   Prostate cancer Father    Testicular cancer Son     Physical Exam: Vitals:   04/07/22 0145 04/07/22 0200 04/07/22 0207 04/07/22 0215  BP: (!) 183/86 (!) 159/78 (!) 159/78 (!) 171/81  Pulse: 95 77 78 78  Resp:   16   Temp:   97.7 F (36.5 C)   TempSrc:   Oral   SpO2: 94% 94% 98% 96%  Weight:        General:  Alert and oriented times three, well developed and nourished, no acute distress Eyes: PERRLA, pink conjunctiva, no scleral icterus ENT: Moist oral mucosa, neck supple, no thyromegaly Lungs: clear to ascultation, no wheeze, no crackles, no use of accessory muscles Cardiovascular: regular rate and rhythm, no regurgitation, no gallops, no murmurs. No carotid bruits, no JVD Abdomen: soft, positive BS, non-tender, non-distended, no organomegaly, not an acute abdomen GU: not examined Neuro: CN II - XII grossly intact, sensation intact Musculoskeletal: strength 5/5 all extremities, no clubbing, cyanosis or edema Skin: no rash, no subcutaneous crepitation, no decubitus Psych: appropriate patient Circulatory.  No pedal pulse regular extremity   Labs on Admission:  Recent Labs    04/06/22 1211 04/06/22 2150  NA 144 141  K 4.4 3.5  CL 106 107  CO2 31 25  GLUCOSE 101* 144*  BUN 16 18  CREATININE 0.86 1.02  CALCIUM 9.8 9.2   Recent Labs    04/06/22 1211  AST 27  ALT 27  ALKPHOS 60  BILITOT 0.8  PROT 7.0  ALBUMIN 4.3    Recent Labs    04/06/22 1211 04/06/22 2150  WBC 4.7 3.7*  HGB 12.8* 12.3*  HCT 39.2 39.4  MCV 86.7 91.0  PLT 110.0* 106*    Radiological Exams on Admission: CT Angio Chest/Abd/Pel for Dissection W and/or W/WO  Result Date: 04/07/2022 CLINICAL DATA:  Acute aortic syndrome (AAS) suspected CP/AP, elevated troponin. Pt does have hx/o DVT - please try to bolus contrast to eval for major central PE if possible as well. EXAM: CT  ANGIOGRAPHY CHEST, ABDOMEN AND PELVIS TECHNIQUE: Non-contrast CT of the chest was initially obtained. Multidetector CT imaging through the chest, abdomen and pelvis was performed using the standard protocol during bolus administration of intravenous contrast. Multiplanar reconstructed images and MIPs were obtained and reviewed to evaluate the vascular anatomy. RADIATION DOSE REDUCTION: This exam was performed according to the departmental dose-optimization program which includes automated exposure control, adjustment of the mA and/or kV according to patient size and/or use of iterative reconstruction technique. CONTRAST:  163m OMNIPAQUE IOHEXOL 350 MG/ML SOLN COMPARISON:  CT abdomen pelvis 04/06/2022 FINDINGS: CTA CHEST FINDINGS Cardiovascular: Preferential opacification of the thoracic aorta. No evidence of thoracic aortic aneurysm or dissection. Normal heart size. No significant pericardial effusion. Severe atherosclerotic plaque of the thoracic aorta. Two vessel coronary artery calcifications. The main pulmonary artery is normal in  caliber. No central or segmental pulmonary embolus. Mediastinum/Nodes: Right hilar lymph node enlarged measuring up to 1.5 cm. No left hilar lymphadenopathy. No enlarged mediastinal, hilar, or axillary lymph nodes. Thyroid gland, trachea, and esophagus demonstrate no significant findings. Lungs/Pleura: At least mild paraseptal and moderate central emphysematous changes. No focal consolidation. Subpleural 8 mm nodule within right lower lobe. No pulmonary mass. No pleural effusion. No pneumothorax. Musculoskeletal: No chest wall abnormality. No suspicious lytic or blastic osseous lesions. No acute displaced fracture. Multilevel degenerative changes of the spine. Bilateral shoulder degenerative changes. Review of the MIP images confirms the above findings. CTA ABDOMEN AND PELVIS FINDINGS VASCULAR Aorta: Normal caliber aorta without aneurysm, dissection, vasculitis or significant  stenosis. 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 Distally the celiac opacified and is slightly dilated. Associated collaterals. Otherwise the spleen patent without evidence of aneurysm, dissection, vasculitis or significant stenosis. SMA: Severe narrowing of the origin and proximal superior mesenteric artery with opacification noted distally. Tortuosity of the distal superior mesenteric artery. No dissection or aneurysm. Renals: Both renal arteries are patent without evidence of aneurysm, dissection, vasculitis, fibromuscular dysplasia or significant stenosis. IMA: Patent without evidence of aneurysm, dissection, vasculitis or significant stenosis. Inflow: Patent without evidence of aneurysm, dissection, vasculitis or significant stenosis. Veins: No obvious venous abnormality within the limitations of this arterial phase study. Review of the MIP images confirms the above findings. NON-VASCULAR Hepatobiliary: No focal liver abnormality. No gallstones, gallbladder wall thickening, or pericholecystic fluid. No biliary dilatation. Pancreas: No focal lesion. Normal pancreatic contour. No surrounding inflammatory changes. No main pancreatic ductal dilatation. Spleen: Normal in size without focal abnormality. Adrenals/Urinary Tract: No adrenal nodule bilaterally. Bilateral kidneys enhance symmetrically. Fluid density lesions likely represent simple renal cysts. Simple renal cysts, in the absence of clinically indicated signs/symptoms, require no independent follow-up. Subcentimeter hypodensities are too small to characterize-no further follow-up indicated. No hydronephrosis. No hydroureter. The urinary bladder is unremarkable. Stomach/Bowel: Stomach is within normal limits. No evidence of bowel wall thickening or dilatation. Pneumatosis. Colonic diverticulosis. The appendix is not definitely identified with no inflammatory changes in the right  lower quadrant to suggest acute appendicitis. Lymphatic: No lymphadenopathy. Reproductive: Prostate is unremarkable. Other: No intraperitoneal free fluid. No intraperitoneal free gas. No organized fluid collection. Musculoskeletal: No abdominal wall hernia or abnormality. No suspicious lytic or blastic osseous lesions. No acute displaced fracture. Multilevel degenerative changes of the spine. Grade 1 anterolisthesis of L3 on L4. L4 laminectomy. Total left hip arthroplasty partially visualized. Review of the MIP images confirms the above findings. IMPRESSION: 1. No acute thoracic or abdominal aorta abnormality. 2. Aortic Atherosclerosis (ICD10-I70.0) - severe with severe (likely chronic) narrowing of the origin/proximal celiac and superior mesenteric arteries with opacification distally. No findings to suggest associated ischemia of the abdominal or pelvic organs. 3. Tortuosity of the distal superior mesenteric artery. 4. Right lower lobe 8 mm subpleural nodule. Non-contrast chest CT at 6-12 months is recommended. If the nodule is stable at time of repeat CT, then future CT at 18-24 months (from today's scan) is considered optional for low-risk patients, but is recommended for high-risk patients. This recommendation follows the consensus statement: Guidelines for Management of Incidental Pulmonary Nodules Detected on CT Images: From the Fleischner Society 2017; Radiology 2017; 284:228-243. 5. Nonspecific 1.5 cm right hilar enlarged lymph node. Attention on follow-up. 6. Colonic diverticulosis with no acute diverticulitis. 7. Emphysema (ICD10-J43.9). Electronically Signed   By: Clelia Croft.D.  On: 04/07/2022 01:16   CT Head Wo Contrast  Result Date: 04/07/2022 CLINICAL DATA:  Neuro deficit, acute, stroke suspected vertigo EXAM: CT HEAD WITHOUT CONTRAST TECHNIQUE: Contiguous axial images were obtained from the base of the skull through the vertex without intravenous contrast. RADIATION DOSE REDUCTION: This  exam was performed according to the departmental dose-optimization program which includes automated exposure control, adjustment of the mA and/or kV according to patient size and/or use of iterative reconstruction technique. COMPARISON:  CT head 10/31/2019 FINDINGS: Brain: Cerebral ventricle sizes are concordant with the degree of cerebral volume loss. Patchy and confluent areas of decreased attenuation are noted throughout the deep and periventricular white matter of the cerebral hemispheres bilaterally, compatible with chronic microvascular ischemic disease. No evidence of large-territorial acute infarction. No parenchymal hemorrhage. No mass lesion. No extra-axial collection. No mass effect or midline shift. No hydrocephalus. Basilar cisterns are patent. Vascular: No hyperdense vessel. Atherosclerotic calcifications are present within the cavernous internal carotid and vertebral arteries. Skull: No acute fracture or focal lesion. Sinuses/Orbits: Paranasal sinuses and mastoid air cells are clear. The orbits are unremarkable. Other: None. IMPRESSION: No acute intracranial abnormality. Electronically Signed   By: Iven Finn M.D.   On: 04/07/2022 00:57   CT Abdomen Pelvis Wo Contrast  Result Date: 04/06/2022 CLINICAL DATA:  Fall 3 days ago. Chest and abdominal pain. History of prostate cancer. EXAM: CT ABDOMEN AND PELVIS WITHOUT CONTRAST TECHNIQUE: Multidetector CT imaging of the abdomen and pelvis was performed following the standard protocol without IV contrast. RADIATION DOSE REDUCTION: This exam was performed according to the departmental dose-optimization program which includes automated exposure control, adjustment of the mA and/or kV according to patient size and/or use of iterative reconstruction technique. COMPARISON:  05/04/2020 FINDINGS: Lower chest: Coronary and aortic atherosclerosis. Mild bibasilar scarring. Hepatobiliary: Unremarkable Pancreas: Unremarkable Spleen: Unremarkable  Adrenals/Urinary Tract: Both adrenal glands appear normal. Fluid density left renal cysts. No further imaging workup of these lesions is indicated. 2 mm left mid upper kidney calculus. Subtle 5 by 2 by 5 mm density in the left mid kidney on image 31 series 9 is technically nonspecific although statistically likely to be a tiny complex cyst. Measured internal density about 46 Hounsfield units. Not readily apparent on MRI of 07/03/2020. Stomach/Bowel: Sigmoid colon diverticulosis. Vascular/Lymphatic: Atherosclerosis is present, including aortoiliac atherosclerotic disease. No pathologic adenopathy. Reproductive: Fiducials noted along the fairly small volume prostate gland. Other: No supplemental non-categorized findings. Musculoskeletal: Left hip prosthesis. Posterior decompression at L4-5. 7 mm degenerative anterolisthesis at L3-4 with resulting bilateral foraminal impingement and central narrowing of the thecal sac. Multilevel lumbar impingement due to spondylosis and degenerative disc disease. Small umbilical hernia contains adipose tissue. IMPRESSION: 1. A specific cause for the patient's acute abdominal pain is not identified. 2. 2 mm nonobstructive left mid upper kidney calculus. 3. 5 by 2 by 5 mm density in the left mid kidney is technically nonspecific although statistically likely to be a tiny complex cyst. This is not readily apparent on MRI of 07/03/2020. If the patient has hematuria or if otherwise clinically warranted, renal protocol MRI with and without contrast could be utilized to further assess this lesion, although the small size the lesion may make definitive characterization difficult. 4. Multilevel lumbar impingement due to spondylosis and degenerative disc disease. 5. Sigmoid colon diverticulosis. 6. Small umbilical hernia contains adipose tissue. 7. Coronary and aortic atherosclerosis. Aortic Atherosclerosis (ICD10-I70.0). Electronically Signed   By: Van Clines M.D.   On: 04/06/2022  14:44  Assessment/Plan Present on Admission:  NSTEMI (non-ST elevated myocardial infarction) (HCC)/uncontrolled hypertension -Admit to [facility] -ACS order set, initiated.  Heparin drip started in the ER, continued.  Continue aspirin, Lipitor at 80 mg, continue Plavix.  Add Coreg twice daily -As needed Lopressor initiated -2D echo -Elevated troponin may be secondary to uncontrolled hypertension as opposed to NSTEMI.  However given patient's cardiac risk factors including patient's pulseless right lower extremity pedal pulse, history of TIA, carotid stenosis, hypertension, dyslipidemia, thought prudent to transfer patient to the cardiology consult.   PAD (peripheral artery disease) (HCC)/ Carotid arterial disease (HCC)  -Good perfusion lower extremity   Hyperlipidemia -Lipid panel ordered, continue statin   Essential hypertension, uncontrolled -Patient's Avapro resumed, Coreg 6.25 mg twice daily added -Lopressor PRN -Better blood pressure control needed   History of TIA -Continue.  Plavix, Lipitor   Thrombocytopenia -Aware, monitor   History of DVT  History of prostate cancer   Tiffiney Sparrow 04/07/2022, 2:31 AM

## 2022-04-07 NOTE — Care Management Obs Status (Signed)
[facility] NOTIFICATION   Patient Details  Name: KENYON EICHELBERGER MRN: [id number] Date of Birth: 1930/05/08   Medicare Observation Status Notification Given:  Yes    Bethena Roys, RN 04/07/2022, 3:29 PM

## 2022-04-07 NOTE — ED Notes (Signed)
Contacted [facility] to give report to have pt transported to [facility]. Report also given to accepting RN.

## 2022-04-07 NOTE — Progress Notes (Signed)
Echocardiogram 2D Echocardiogram has been performed.  Ronald Stafford 04/07/2022, 1:28 PM

## 2022-04-07 NOTE — Discharge Summary (Signed)
Physician Discharge Summary  Ronald Stafford EGB:[id number] DOB: 10-16-1930 DOA: 04/06/2022  PCP: Binnie Rail, MD  Admit date: 04/06/2022 Discharge date: 04/08/2022  Admitted From: Home  Discharge disposition: Home   Recommendations for Outpatient Follow-Up:   Follow up with your primary care provider in one week.  Check CBC, BMP, magnesium in the next visit Follow-up with cardiology in 1 to 2 weeks.  Discharge Diagnosis:   Principal Problem:   Elevated troponin Active Problems:   Hyperlipidemia   Essential hypertension   PROSTATE CANCER, HX OF   Carotid arterial disease (HCC)   History of TIA (transient ischemic attack)   Memory difficulty   PAD (peripheral artery disease) ([place])   Discharge Condition: Improved.  Diet recommendation: Low sodium, heart healthy.    Wound care: None.  Code status: Full.   History of Present Illness:   Patient is a 89 year old male with past medical history of DVT, hyperlipidemia, PAD, history of TIA, hypertension, and prostate cancer presented hospital with chest pain.  In the ED, patient had elevated troponin at 70, blood pressure  was uncontrolled at 193/95.  Cardiology was notified from the ED and recommended admission for further evaluation and treatment.  Hospital Course:   Following conditions were addressed during hospitalization as listed below,  Elevated troponins.  Nonspecific. Did have atypical chest pain.  Patient was initially started on heparin drip.  Seen by cardiology.    Continue aspirin plavix coreg.  Thought to be atypical chest pain.  2D echocardiogram showed normal wall motion.  Cardiology recommends outpatient follow-up.  PAD (peripheral artery disease)/Carotid arterial disease continue Plavix Lipitor   Hyperlipidemia Continue statins.     Essential hypertension, uncontrolled Continue Avapro, Coreg.    History of TIA Continue Plavix, Lipitor   Thrombocytopenia Mild.   History of DVT/History of  prostate cancer Not on anticoagulation at home.  Currently on aspirin and Plavix.  Disposition.  At this time, patient is stable for disposition home with outpatient PCP and cardiology follow-up.  Medical Consultants:   Cardiology  Procedures:    2D echocardiogram Subjective:   Today, patient was seen and examined at bedside.  Wants to go home.  Denies any chest pain, shortness of breath, dyspnea, dizziness lightheadedness.  Discharge Exam:   Vitals:   04/07/22 1226 04/07/22 1420  BP: 126/85 (!) 153/80  Pulse: 70 72  Resp: 14 16  Temp:  98.6 F (37 C)  SpO2: 94% 95%   Vitals:   04/07/22 1200 04/07/22 1215 04/07/22 1226 04/07/22 1420  BP:   126/85 (!) 153/80  Pulse: 74 70 70 72  Resp: '11 14 14 16  '$ Temp:    98.6 F (37 C)  TempSrc:    Oral  SpO2: 97% 94% 94% 95%  Weight:        General: Alert awake, not in obvious distress, elderly male, alert awake and oriented HENT: pupils equally reacting to light,  No scleral pallor or icterus noted. Oral mucosa is moist.  Chest:  Clear breath sounds.  Diminished breath sounds bilaterally. No crackles or wheezes.  CVS: S1 &S2 heard. No murmur.  Regular rate and rhythm. Abdomen: Soft, nontender, nondistended.  Bowel sounds are heard.   Extremities: No cyanosis, clubbing or edema.  Peripheral pulses are palpable. Psych: Alert, awake and oriented, normal mood CNS:  No cranial nerve deficits.  Power equal in all extremities.   Skin: Warm and dry.  No rashes noted.  The results of significant diagnostics from this hospitalization (  including imaging, microbiology, ancillary and laboratory) are listed below for reference.     Diagnostic Studies:   ECHOCARDIOGRAM COMPLETE  Result Date: 04/07/2022    ECHOCARDIOGRAM REPORT   Patient Name:   Ronald Stafford Greeno Date of Exam: 04/07/2022 Medical Rec #:  [id number]    Height:       74.0 in Accession #:    [id number]   Weight:       180.0 lb Date of Birth:  01-Aug-1930     BSA:          2.078 m  Patient Age:    89 years     BP:           124/62 mmHg Patient Gender: M            HR:           74 bpm. Exam Location:  Inpatient Procedure: 2D Echo, Cardiac Doppler and Color Doppler Indications:    NSTEMI I21.4  History:        Patient has no prior history of Echocardiogram examinations.                 Previous Myocardial Infarction, TIA; Risk Factors:Hypertension                 and Dyslipidemia.  Sonographer:    Ronny Flurry Referring Phys: [phone] DEBBY CROSLEY  Sonographer Comments: Suboptimal parasternal window and suboptimal apical window. Image acquisition challenging due to respiratory motion. IMPRESSIONS  1. Overall poor quality images and no definity used.  2. Left ventricular ejection fraction, by estimation, is 60 to 65%. The left ventricle has normal function. The left ventricle has no regional wall motion abnormalities. Left ventricular diastolic parameters are consistent with Grade I diastolic dysfunction (impaired relaxation).  3. Right ventricular systolic function is normal. The right ventricular size is normal.  4. Likley shadowing artifact in RA in subcostal views May be from lipomatous hypertrophy Can consider chest CTA to further r/o mass in RA.  5. The mitral valve is normal in structure. No evidence of mitral valve regurgitation. No evidence of mitral stenosis.  6. The aortic valve was not well visualized. There is mild calcification of the aortic valve. There is mild thickening of the aortic valve. Aortic valve regurgitation is not visualized. Aortic valve sclerosis is present, with no evidence of aortic valve  stenosis.  7. The inferior vena cava is normal in size with greater than 50% respiratory variability, suggesting right atrial pressure of 3 mmHg. FINDINGS  Left Ventricle: Left ventricular ejection fraction, by estimation, is 60 to 65%. The left ventricle has normal function. The left ventricle has no regional wall motion abnormalities. The left ventricular internal cavity size  was normal in size. There is  no left ventricular hypertrophy. Left ventricular diastolic parameters are consistent with Grade I diastolic dysfunction (impaired relaxation). Right Ventricle: The right ventricular size is normal. No increase in right ventricular wall thickness. Right ventricular systolic function is normal. Left Atrium: Left atrial size was normal in size. Right Atrium: Likley shadowing artifact in RA in subcostal views May be from lipomatous hypertrophy Can consider chest CTA to further r/o mass in RA. Right atrial size was normal in size. Pericardium: There is no evidence of pericardial effusion. Mitral Valve: The mitral valve is normal in structure. No evidence of mitral valve regurgitation. No evidence of mitral valve stenosis. Tricuspid Valve: The tricuspid valve is normal in structure. Tricuspid valve regurgitation is not demonstrated. No evidence of  tricuspid stenosis. Aortic Valve: The aortic valve was not well visualized. There is mild calcification of the aortic valve. There is mild thickening of the aortic valve. Aortic valve regurgitation is not visualized. Aortic valve sclerosis is present, with no evidence of aortic valve stenosis. Aortic valve mean gradient measures 3.0 mmHg. Aortic valve peak gradient measures 4.8 mmHg. Aortic valve area, by VTI measures 2.29 cm. Pulmonic Valve: The pulmonic valve was normal in structure. Pulmonic valve regurgitation is not visualized. No evidence of pulmonic stenosis. Aorta: The aortic root is normal in size and structure. Venous: The inferior vena cava is normal in size with greater than 50% respiratory variability, suggesting right atrial pressure of 3 mmHg. IAS/Shunts: No atrial level shunt detected by color flow Doppler. Additional Comments: Overall poor quality images and no definity used.  LEFT VENTRICLE PLAX 2D LVIDd:         4.30 cm   Diastology LVIDs:         2.80 cm   LV e' medial:   5.98 cm/s LV PW:         0.90 cm   LV E/e' medial: 8.3  LV IVS:        0.80 cm LVOT diam:     2.10 cm LV SV:         52 LV SV Index:   25 LVOT Area:     3.46 cm  IVC IVC diam: 1.60 cm LEFT ATRIUM         Index LA diam:    3.00 cm 1.44 cm/m  AORTIC VALVE AV Area (Vmax):    2.22 cm AV Area (Vmean):   2.19 cm AV Area (VTI):     2.29 cm AV Vmax:           109.00 cm/s AV Vmean:          75.500 cm/s AV VTI:            0.228 m AV Peak Grad:      4.8 mmHg AV Mean Grad:      3.0 mmHg LVOT Vmax:         69.85 cm/s LVOT Vmean:        47.800 cm/s LVOT VTI:          0.150 m LVOT/AV VTI ratio: 0.66  AORTA Ao Root diam: 3.40 cm MITRAL VALVE MV Area (PHT): 3.91 cm    SHUNTS MV Decel Time: 194 msec    Systemic VTI:  0.15 m MV E velocity: 49.70 cm/s  Systemic Diam: 2.10 cm MV A velocity: 72.00 cm/s MV E/A ratio:  0.69 Jenkins Rouge MD Electronically signed by Jenkins Rouge MD Signature Date/Time: 04/07/2022/2:08:42 PM    Final    CT Angio Chest/Abd/Pel for Dissection W and/or W/WO  Result Date: 04/07/2022 CLINICAL DATA:  Acute aortic syndrome (AAS) suspected CP/AP, elevated troponin. Pt does have hx/o DVT - please try to bolus contrast to eval for major central PE if possible as well. EXAM: CT ANGIOGRAPHY CHEST, ABDOMEN AND PELVIS TECHNIQUE: Non-contrast CT of the chest was initially obtained. Multidetector CT imaging through the chest, abdomen and pelvis was performed using the standard protocol during bolus administration of intravenous contrast. Multiplanar reconstructed images and MIPs were obtained and reviewed to evaluate the vascular anatomy. RADIATION DOSE REDUCTION: This exam was performed according to the departmental dose-optimization program which includes automated exposure control, adjustment of the mA and/or kV according to patient size and/or use of iterative reconstruction technique. CONTRAST:  166m OMNIPAQUE  IOHEXOL 350 MG/ML SOLN COMPARISON:  CT abdomen pelvis 04/06/2022 FINDINGS: CTA CHEST FINDINGS Cardiovascular: Preferential opacification of the thoracic  aorta. No evidence of thoracic aortic aneurysm or dissection. Normal heart size. No significant pericardial effusion. Severe atherosclerotic plaque of the thoracic aorta. Two vessel coronary artery calcifications. The main pulmonary artery is normal in caliber. No central or segmental pulmonary embolus. Mediastinum/Nodes: Right hilar lymph node enlarged measuring up to 1.5 cm. No left hilar lymphadenopathy. No enlarged mediastinal, hilar, or axillary lymph nodes. Thyroid gland, trachea, and esophagus demonstrate no significant findings. Lungs/Pleura: At least mild paraseptal and moderate central emphysematous changes. No focal consolidation. Subpleural 8 mm nodule within right lower lobe. No pulmonary mass. No pleural effusion. No pneumothorax. Musculoskeletal: No chest wall abnormality. No suspicious lytic or blastic osseous lesions. No acute displaced fracture. Multilevel degenerative changes of the spine. Bilateral shoulder degenerative changes. Review of the MIP images confirms the above findings. CTA ABDOMEN AND PELVIS FINDINGS VASCULAR Aorta: Normal caliber aorta without aneurysm, dissection, vasculitis or significant stenosis. 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 Distally the celiac opacified and is slightly dilated. Associated collaterals. Otherwise the spleen patent without evidence of aneurysm, dissection, vasculitis or significant stenosis. SMA: Severe narrowing of the origin and proximal superior mesenteric artery with opacification noted distally. Tortuosity of the distal superior mesenteric artery. No dissection or aneurysm. Renals: Both renal arteries are patent without evidence of aneurysm, dissection, vasculitis, fibromuscular dysplasia or significant stenosis. IMA: Patent without evidence of aneurysm, dissection, vasculitis or significant stenosis. Inflow: Patent without evidence of aneurysm, dissection, vasculitis or  significant stenosis. Veins: No obvious venous abnormality within the limitations of this arterial phase study. Review of the MIP images confirms the above findings. NON-VASCULAR Hepatobiliary: No focal liver abnormality. No gallstones, gallbladder wall thickening, or pericholecystic fluid. No biliary dilatation. Pancreas: No focal lesion. Normal pancreatic contour. No surrounding inflammatory changes. No main pancreatic ductal dilatation. Spleen: Normal in size without focal abnormality. Adrenals/Urinary Tract: No adrenal nodule bilaterally. Bilateral kidneys enhance symmetrically. Fluid density lesions likely represent simple renal cysts. Simple renal cysts, in the absence of clinically indicated signs/symptoms, require no independent follow-up. Subcentimeter hypodensities are too small to characterize-no further follow-up indicated. No hydronephrosis. No hydroureter. The urinary bladder is unremarkable. Stomach/Bowel: Stomach is within normal limits. No evidence of bowel wall thickening or dilatation. Pneumatosis. Colonic diverticulosis. The appendix is not definitely identified with no inflammatory changes in the right lower quadrant to suggest acute appendicitis. Lymphatic: No lymphadenopathy. Reproductive: Prostate is unremarkable. Other: No intraperitoneal free fluid. No intraperitoneal free gas. No organized fluid collection. Musculoskeletal: No abdominal wall hernia or abnormality. No suspicious lytic or blastic osseous lesions. No acute displaced fracture. Multilevel degenerative changes of the spine. Grade 1 anterolisthesis of L3 on L4. L4 laminectomy. Total left hip arthroplasty partially visualized. Review of the MIP images confirms the above findings. IMPRESSION: 1. No acute thoracic or abdominal aorta abnormality. 2. Aortic Atherosclerosis (ICD10-I70.0) - severe with severe (likely chronic) narrowing of the origin/proximal celiac and superior mesenteric arteries with opacification distally. No  findings to suggest associated ischemia of the abdominal or pelvic organs. 3. Tortuosity of the distal superior mesenteric artery. 4. Right lower lobe 8 mm subpleural nodule. Non-contrast chest CT at 6-12 months is recommended. If the nodule is stable at time of repeat CT, then future CT at 18-24 months (from today's scan) is considered optional for low-risk patients, but is recommended for high-risk patients. This  recommendation follows the consensus statement: Guidelines for Management of Incidental Pulmonary Nodules Detected on CT Images: From the Fleischner Society 2017; Radiology 2017; 284:228-243. 5. Nonspecific 1.5 cm right hilar enlarged lymph node. Attention on follow-up. 6. Colonic diverticulosis with no acute diverticulitis. 7. Emphysema (ICD10-J43.9). Electronically Signed   By: Iven Finn M.D.   On: 04/07/2022 01:16   CT Head Wo Contrast  Result Date: 04/07/2022 CLINICAL DATA:  Neuro deficit, acute, stroke suspected vertigo EXAM: CT HEAD WITHOUT CONTRAST TECHNIQUE: Contiguous axial images were obtained from the base of the skull through the vertex without intravenous contrast. RADIATION DOSE REDUCTION: This exam was performed according to the departmental dose-optimization program which includes automated exposure control, adjustment of the mA and/or kV according to patient size and/or use of iterative reconstruction technique. COMPARISON:  CT head 10/31/2019 FINDINGS: Brain: Cerebral ventricle sizes are concordant with the degree of cerebral volume loss. Patchy and confluent areas of decreased attenuation are noted throughout the deep and periventricular white matter of the cerebral hemispheres bilaterally, compatible with chronic microvascular ischemic disease. No evidence of large-territorial acute infarction. No parenchymal hemorrhage. No mass lesion. No extra-axial collection. No mass effect or midline shift. No hydrocephalus. Basilar cisterns are patent. Vascular: No hyperdense vessel.  Atherosclerotic calcifications are present within the cavernous internal carotid and vertebral arteries. Skull: No acute fracture or focal lesion. Sinuses/Orbits: Paranasal sinuses and mastoid air cells are clear. The orbits are unremarkable. Other: None. IMPRESSION: No acute intracranial abnormality. Electronically Signed   By: Iven Finn M.D.   On: 04/07/2022 00:57   CT Abdomen Pelvis Wo Contrast  Result Date: 04/06/2022 CLINICAL DATA:  Fall 3 days ago. Chest and abdominal pain. History of prostate cancer. EXAM: CT ABDOMEN AND PELVIS WITHOUT CONTRAST TECHNIQUE: Multidetector CT imaging of the abdomen and pelvis was performed following the standard protocol without IV contrast. RADIATION DOSE REDUCTION: This exam was performed according to the departmental dose-optimization program which includes automated exposure control, adjustment of the mA and/or kV according to patient size and/or use of iterative reconstruction technique. COMPARISON:  05/04/2020 FINDINGS: Lower chest: Coronary and aortic atherosclerosis. Mild bibasilar scarring. Hepatobiliary: Unremarkable Pancreas: Unremarkable Spleen: Unremarkable Adrenals/Urinary Tract: Both adrenal glands appear normal. Fluid density left renal cysts. No further imaging workup of these lesions is indicated. 2 mm left mid upper kidney calculus. Subtle 5 by 2 by 5 mm density in the left mid kidney on image 31 series 9 is technically nonspecific although statistically likely to be a tiny complex cyst. Measured internal density about 46 Hounsfield units. Not readily apparent on MRI of 07/03/2020. Stomach/Bowel: Sigmoid colon diverticulosis. Vascular/Lymphatic: Atherosclerosis is present, including aortoiliac atherosclerotic disease. No pathologic adenopathy. Reproductive: Fiducials noted along the fairly small volume prostate gland. Other: No supplemental non-categorized findings. Musculoskeletal: Left hip prosthesis. Posterior decompression at L4-5. 7 mm  degenerative anterolisthesis at L3-4 with resulting bilateral foraminal impingement and central narrowing of the thecal sac. Multilevel lumbar impingement due to spondylosis and degenerative disc disease. Small umbilical hernia contains adipose tissue. IMPRESSION: 1. A specific cause for the patient's acute abdominal pain is not identified. 2. 2 mm nonobstructive left mid upper kidney calculus. 3. 5 by 2 by 5 mm density in the left mid kidney is technically nonspecific although statistically likely to be a tiny complex cyst. This is not readily apparent on MRI of 07/03/2020. If the patient has hematuria or if otherwise clinically warranted, renal protocol MRI with and without contrast could be utilized to further assess this lesion, although the  small size the lesion may make definitive characterization difficult. 4. Multilevel lumbar impingement due to spondylosis and degenerative disc disease. 5. Sigmoid colon diverticulosis. 6. Small umbilical hernia contains adipose tissue. 7. Coronary and aortic atherosclerosis. Aortic Atherosclerosis (ICD10-I70.0). Electronically Signed   By: Van Clines M.D.   On: 04/06/2022 14:44     Labs:   Basic Metabolic Panel: Recent Labs  Lab 04/06/22 1211 04/06/22 2150 04/07/22 0517  NA 144 141 141  K 4.4 3.5 3.6  CL 106 107 105  CO2 '31 25 28  '$ GLUCOSE 101* 144* 114*  BUN '16 18 17  '$ CREATININE 0.86 1.02 0.86  CALCIUM 9.8 9.2 8.8*  MG  --   --  1.7   GFR Estimated Creatinine Clearance: 67.2 mL/min (by C-G formula based on SCr of 0.86 mg/dL). Liver Function Tests: Recent Labs  Lab 04/06/22 1211  AST 27  ALT 27  ALKPHOS 60  BILITOT 0.8  PROT 7.0  ALBUMIN 4.3   No results for input(s): "LIPASE", "AMYLASE" in the last 168 hours. No results for input(s): "AMMONIA" in the last 168 hours. Coagulation profile Recent Labs  Lab 04/07/22 0234  INR 1.1    CBC: Recent Labs  Lab 04/06/22 1211 04/06/22 2150 04/07/22 0517  WBC 4.7 3.7* 4.0   NEUTROABS  --   --  2.2  HGB 12.8* 12.3* 11.1*  HCT 39.2 39.4 36.1*  MCV 86.7 91.0 90.5  PLT 110.0* 106* 104*   Cardiac Enzymes: No results for input(s): "CKTOTAL", "CKMB", "CKMBINDEX", "TROPONINI" in the last 168 hours. BNP: Invalid input(s): "POCBNP" CBG: Recent Labs  Lab 04/06/22 2204  GLUCAP 141*   D-Dimer No results for input(s): "DDIMER" in the last 72 hours. Hgb A1c No results for input(s): "HGBA1C" in the last 72 hours. Lipid Profile Recent Labs    04/07/22 0517  CHOL 102  HDL 37*  LDLCALC 54  TRIG 55  CHOLHDL 2.8   Thyroid function studies No results for input(s): "TSH", "T4TOTAL", "T3FREE", "THYROIDAB" in the last 72 hours.  Invalid input(s): "FREET3" Anemia work up No results for input(s): "VITAMINB12", "FOLATE", "FERRITIN", "TIBC", "IRON", "RETICCTPCT" in the last 72 hours. Microbiology No results found for this or any previous visit (from the past 240 hour(s)).   Discharge Instructions:   Discharge Instructions     Diet - low sodium heart healthy   Complete by: As directed    Discharge instructions   Complete by: As directed    Follow-up with your primary care provider in 1 week.  Seek medical attention for worsening symptoms.  No overexertion.  Take medications as prescribed. Follow up with cardiology as outpatient.   Increase activity slowly   Complete by: As directed       Allergies as of 04/07/2022       Reactions   Chicken Allergy Nausea And Vomiting, Other (See Comments)   Digestive problems        Medication List     TAKE these medications    acetaminophen 500 MG tablet Commonly known as: TYLENOL Take 1,000 mg by mouth every 6 (six) hours as needed for mild pain.   atorvastatin 40 MG tablet Commonly known as: LIPITOR TAKE ONE TABLET BY MOUTH EVERY MORNING What changed: when to take this   carvedilol 6.25 MG tablet Commonly known as: COREG Take 1 tablet (6.25 mg total) by mouth 2 (two) times daily with a meal.    clopidogrel 75 MG tablet Commonly known as: PLAVIX TAKE ONE TABLET BY MOUTH EVERY  MORNING What changed: when to take this   Fish Oil 500 MG Caps Take 500 mg by mouth daily.   ipratropium-albuterol 0.5-2.5 (3) MG/3ML Soln Commonly known as: DUONEB Take 3 mLs by nebulization every 6 (six) hours as needed (shortness of breath).   meclizine 12.5 MG tablet Commonly known as: ANTIVERT Take 1 tablet (12.5 mg total) by mouth 3 (three) times daily as needed for dizziness.   mirabegron ER 25 MG Tb24 tablet Commonly known as: MYRBETRIQ Take 1 tablet (25 mg total) by mouth daily as needed. What changed: reasons to take this   valsartan 80 MG tablet Commonly known as: DIOVAN Take 1 tablet (80 mg total) by mouth daily.   vitamin C 250 MG tablet Commonly known as: ASCORBIC ACID Take 250 mg by mouth daily.        Follow-up Information     Binnie Rail, MD Follow up in 1 week(s).   Specialty: Internal Medicine Contact information: [place] [address] [phone]         Buford Dresser, MD. Go in 1 week(s).   Specialty: Cardiology Why: as per cardiology office Contact information: [place] [address] [phone]                  Time coordinating discharge: 39 minutes  Signed:  Kelissa Merlin  [facility] 04/08/2022, 11:49 AM

## 2022-04-08 LAB — LIPOPROTEIN A (LPA): Lipoprotein (a): <8.4 nmol/L (ref <75.0)

## 2022-04-09 ENCOUNTER — Encounter: Payer: Self-pay | Admitting: Internal Medicine

## 2022-04-09 NOTE — Patient Instructions (Signed)
      Blood work was ordered.   The lab is on the first floor.    Medications changes include :       A referral was ordered for XXX.     Someone will call you to schedule an appointment.    No follow-ups on file.

## 2022-04-09 NOTE — Progress Notes (Signed)
Subjective:    Patient ID: Ronald Stafford, male    DOB: [birth year], 89 y.o.   MRN: [id number]     HPI Ronald Stafford is here for follow up from the ED.  He was seen here by Judson Roch 1/25 for chest pain.  Also had abdominal pain, chills, dec appetite.  Past 2 days had constant epigastric chest pain.    EKG unchanged. Ct abd/pelvis w/o acute finding to explain abd pain. Troponin was high - other labs and urine negative.  Advised to go to ED.  Hospital - BP was very elevated.    Elevated troponins treated with heparin drip, cardiology saw him.  Echo showed normal wall motion.  Cardio advised outpatient f/u Htn: continued on avapro, coreg HLD:  on lipitor PAD, h/o TIA -  continued plavix, lipitor      Medications and allergies reviewed with patient and updated if appropriate.  Current Outpatient Medications on File Prior to Visit  Medication Sig Dispense Refill   acetaminophen (TYLENOL) 500 MG tablet Take 1,000 mg by mouth every 6 (six) hours as needed for mild pain.     atorvastatin (LIPITOR) 40 MG tablet TAKE ONE TABLET BY MOUTH EVERY MORNING (Patient taking differently: Take 40 mg by mouth daily.) 90 tablet 1   carvedilol (COREG) 6.25 MG tablet Take 1 tablet (6.25 mg total) by mouth 2 (two) times daily with a meal. 60 tablet 2   clopidogrel (PLAVIX) 75 MG tablet TAKE ONE TABLET BY MOUTH EVERY MORNING (Patient taking differently: Take 75 mg by mouth daily.) 90 tablet 5   ipratropium-albuterol (DUONEB) 0.5-2.5 (3) MG/3ML SOLN Take 3 mLs by nebulization every 6 (six) hours as needed (shortness of breath).     meclizine (ANTIVERT) 12.5 MG tablet Take 1 tablet (12.5 mg total) by mouth 3 (three) times daily as needed for dizziness. 60 tablet 2   mirabegron ER (MYRBETRIQ) 25 MG TB24 tablet Take 1 tablet (25 mg total) by mouth daily as needed. (Patient taking differently: Take 25 mg by mouth daily as needed (bladder control).) 90 tablet 1   Omega-3 Fatty Acids (FISH OIL) 500 MG CAPS Take 500 mg by  mouth daily.     valsartan (DIOVAN) 80 MG tablet Take 1 tablet (80 mg total) by mouth daily. 90 tablet 3   vitamin C (ASCORBIC ACID) 250 MG tablet Take 250 mg by mouth daily.     No current facility-administered medications on file prior to visit.     Review of Systems     Objective:  There were no vitals filed for this visit. BP Readings from Last 3 Encounters:  04/07/22 (!) 153/80  04/06/22 (!) 154/72  01/11/22 (!) 180/84   Wt Readings from Last 3 Encounters:  04/06/22 180 lb (81.6 kg)  04/06/22 184 lb 6 oz (83.6 kg)  01/11/22 178 lb 8 oz (81 kg)   There is no height or weight on file to calculate BMI.    Physical Exam     Lab Results  Component Value Date   WBC 4.0 04/07/2022   HGB 11.1 (L) 04/07/2022   HCT 36.1 (L) 04/07/2022   PLT 104 (L) 04/07/2022   GLUCOSE 114 (H) 04/07/2022   CHOL 102 04/07/2022   TRIG 55 04/07/2022   HDL 37 (L) 04/07/2022   LDLCALC 54 04/07/2022   ALT 27 04/06/2022   AST 27 04/06/2022   NA 141 04/07/2022   K 3.6 04/07/2022   CL 105 04/07/2022   CREATININE 0.86  04/07/2022   BUN 17 04/07/2022   CO2 28 04/07/2022   TSH 3.23 10/31/2018   PSA 0.00 (L) 10/31/2018   INR 1.1 04/07/2022   HGBA1C 5.9 06/02/2021     Assessment & Plan:    See Problem List for Assessment and Plan of chronic medical problems.

## 2022-04-10 ENCOUNTER — Telehealth: Payer: Self-pay | Admitting: Internal Medicine

## 2022-04-10 ENCOUNTER — Ambulatory Visit (INDEPENDENT_AMBULATORY_CARE_PROVIDER_SITE_OTHER): Payer: Medicare Other | Admitting: Internal Medicine

## 2022-04-10 VITALS — BP 130/58 | HR 80 | Temp 98.3°F | Ht 74.0 in | Wt 180.0 lb

## 2022-04-10 DIAGNOSIS — I10 Essential (primary) hypertension: Secondary | ICD-10-CM

## 2022-04-10 DIAGNOSIS — N281 Cyst of kidney, acquired: Secondary | ICD-10-CM

## 2022-04-10 DIAGNOSIS — R911 Solitary pulmonary nodule: Secondary | ICD-10-CM

## 2022-04-10 DIAGNOSIS — R0789 Other chest pain: Secondary | ICD-10-CM

## 2022-04-10 LAB — BASIC METABOLIC PANEL
BUN: 23 mg/dL (ref 6–23)
CO2: 30 mEq/L (ref 19–32)
Calcium: 9.9 mg/dL (ref 8.4–10.5)
Chloride: 109 mEq/L (ref 96–112)
Creatinine, Ser: 0.94 mg/dL (ref 0.40–1.50)
GFR: 71.93 mL/min (ref >60.00)
Glucose, Bld: 107 mg/dL — ABNORMAL HIGH (ref 70–99)
Potassium: 4.5 mEq/L (ref 3.5–5.1)
Sodium: 146 mEq/L — ABNORMAL HIGH (ref 135–145)

## 2022-04-10 LAB — MAGNESIUM: Magnesium: 2.1 mg/dL (ref 1.5–2.5)

## 2022-04-10 LAB — CBC WITH DIFFERENTIAL/PLATELET
Basophils Absolute: 0.1 10*3/uL (ref 0.0–0.1)
Basophils Relative: 2.1 % (ref 0.0–3.0)
Eosinophils Absolute: 0.1 10*3/uL (ref 0.0–0.7)
Eosinophils Relative: 1.6 % (ref 0.0–5.0)
HCT: 38.9 % — ABNORMAL LOW (ref 39.0–52.0)
Hemoglobin: 12.7 g/dL — ABNORMAL LOW (ref 13.0–17.0)
Lymphocytes Relative: 18.2 % (ref 12.0–46.0)
Lymphs Abs: 1.1 10*3/uL (ref 0.7–4.0)
MCHC: 32.6 g/dL (ref 30.0–36.0)
MCV: 87.3 fl (ref 78.0–100.0)
Monocytes Absolute: 1 10*3/uL (ref 0.1–1.0)
Monocytes Relative: 16.9 % — ABNORMAL HIGH (ref 3.0–12.0)
Neutro Abs: 3.6 10*3/uL (ref 1.4–7.7)
Neutrophils Relative %: 61.2 % (ref 43.0–77.0)
Platelets: 161 10*3/uL (ref 150.0–400.0)
RBC: 4.46 Mil/uL (ref 4.22–5.81)
RDW: 16.2 % — ABNORMAL HIGH (ref 11.5–15.5)
WBC: 5.9 10*3/uL (ref 4.0–10.5)

## 2022-04-10 MED ORDER — VALSARTAN 80 MG PO TABS: 80.0000 mg | ORAL_TABLET | Freq: Every day | ORAL | 3 refills | Status: DC

## 2022-04-10 MED ORDER — ATORVASTATIN CALCIUM 40 MG PO TABS: 40.0000 mg | ORAL_TABLET | Freq: Every day | ORAL | 2 refills | Status: DC

## 2022-04-10 MED ORDER — CLOPIDOGREL BISULFATE 75 MG PO TABS: 75.0000 mg | ORAL_TABLET | Freq: Every morning | ORAL | 5 refills | Status: DC

## 2022-04-10 MED ORDER — CARVEDILOL 6.25 MG PO TABS: 6.2500 mg | ORAL_TABLET | Freq: Two times a day (BID) | ORAL | 2 refills | Status: DC

## 2022-04-10 NOTE — Assessment & Plan Note (Signed)
New Recent CT of the abdomen pelvis that was without contrast showed possible very small left renal cyst-complex cyst Radiology recommended possible MRI to evaluate further, especially if had certain symptoms Will hold off on follow-up since MRI still may not show what this is Will be getting CT scans of his chest over the next year to monitor his lung nodule-will see if that evaluates the renal cyst-if not may consider MRI but not right now

## 2022-04-10 NOTE — Assessment & Plan Note (Signed)
CT scan from the hospital showed 8 mm RLL subpleural nodule-repeat CT recommended 6-12 months-will order and should be done in August 2024 Also has nonspecific 1.5 cm right hilar enlarged lymph node

## 2022-04-10 NOTE — Telephone Encounter (Signed)
Spoke with patient and gave him number to call GI to reschedule to August.

## 2022-04-10 NOTE — Assessment & Plan Note (Signed)
In the emergency room found to have slightly low magnesium Recheck magnesium level today

## 2022-04-10 NOTE — Assessment & Plan Note (Signed)
Left-sided chest pain He does state chest pain here today He does have some tenderness with palpation left lower sternal border-he thinks this pain is related to the fall He has pain in the lateral left chest, which he thinks is related to his fall Troponin is high in the ED-echo without wall motion abnormality BP was very elevated-much better controlled now Sees cardiology in February for follow-up

## 2022-04-10 NOTE — Telephone Encounter (Signed)
Please call him -    His blood work all looks good.   His Ct scan of his chest looks like it is schedule for 05/04/22 and is supposed to be scheduled for August -- I am not sure if they called him about this or not  but this is supposed to be in August ( as stated on the order)

## 2022-04-10 NOTE — Assessment & Plan Note (Signed)
Chronic Blood pressure well-controlled here today Discussed goal of blood pressure-do not want the blood pressure to drop too low-he will monitor BP at home Continue Coreg 6.25 mg twice daily, valsartan 80 mg daily

## 2022-04-13 ENCOUNTER — Encounter: Payer: Self-pay | Admitting: *Deleted

## 2022-04-13 ENCOUNTER — Telehealth: Payer: Self-pay | Admitting: *Deleted

## 2022-04-13 NOTE — Patient Outreach (Signed)
Care Coordination TOC Note Transition Care Management Follow-up Telephone Call Date of discharge and from where: 04/06/22- [facility]; chest pain; ED EMMI Red Alert notification: "sad, hopeless, anxious empty" How have you been since you were released from the hospital? "I am fine; not having any problems with depression-- I just sometimes get lonely, especially in the winter when it is so dark and cold, I just don't like the weather.  I have talked to Dr. Quay Burow about the way I feel-- I know it will get better after the winter leaves [place] and I can get out more.  I have a girlfriend and we go to church and hang out and have fun together, and my sons come by every Monday night and take me out to eat.  If I start feeling like I am struggling with these issues, I will call you and I will talk to Dr. Quay Burow about it.  Sometimes feeling sad is just a normal part of life" Any questions or concerns? No -- depression and social connection screening completed- no concerns identified; confirmed patient is not socially isolated; patient laughs and communicates easily throughout phone call and he declines need to speak with LCSW or PCP; reports his sadness is "just a part of who I am in the winter months"  Items Reviewed: Did the pt receive and understand the discharge instructions provided? Yes  Medications obtained and verified? No  not indicated for EMMI Red Alert; confirmed patient self-manages medications and denies concerns/ questions around medications today Other? No  Any new allergies since your discharge? No  Dietary orders reviewed? Yes Do you have support at home? Yes  reports lives alone and is independent in self-care activities; local sons assist as inidcated  Home Care and Equipment/Supplies: Were home health services ordered? not applicable If so, what is the name of the agency? N/A  Has the agency set up a time to come to the patient's home? not applicable Were any new equipment or  medical supplies ordered?  No What is the name of the medical supply agency? N/A Were you able to get the supplies/equipment? not applicable Do you have any questions related to the use of the equipment or supplies? No N/A  Functional Questionnaire: (I = Independent and D = Dependent) ADLs: I  Bathing/Dressing- I  Meal Prep- I  Eating- I  Maintaining continence- I  Transferring/Ambulation- I  Managing Meds- I  Follow up appointments reviewed:  PCP Hospital f/u appt confirmed? Yes  confirmed patient attended scheduled PCP office visit post- recent ED visit on 04/10/22- reviewed PCP office visit with patient briefly [facility] f/u appt confirmed? Yes  Scheduled to see cardiology provider on Monday 04/17/22 @ 2:20 pm Are transportation arrangements needed? No  If their condition worsens, is the pt aware to call PCP or go to the Emergency Dept.? Yes Was the patient provided with contact information for the PCP's office or ED? No- patient declined; reports already has contact information for all care providers Was to pt encouraged to call back with questions or concerns? Yes- provided my direct contact number should questions/ concerns/ needs arise in the future  SDOH assessments and interventions completed:   Yes SDOH Interventions Today    Flowsheet Row Most Recent Value  SDOH Interventions   Food Insecurity Interventions Intervention Not Indicated  Transportation Interventions Intervention Not Indicated  [sons provide transportation]  Social Connections Interventions Intervention Not Indicated      Interventions Today    Flowsheet  Row Most Recent Value  Mental Health Interventions   Mental Health Discussed/Reviewed Mental Health Discussed       Care Coordination Interventions:  Depression/ social isolation screening completed    Encounter Outcome:  Pt. Visit Completed    Oneta Rack, RN, BSN, CCRN Alumnus RN CM Care Coordination/ Transition of [facility] Management [phone]: direct office

## 2022-04-16 NOTE — Progress Notes (Signed)
 O  Cardiology Office Note:    Date:  04/17/2022   ID:  Ronald Stafford, DOB 12-10-1930, MRN [id number]  PCP:  Geofm Glade PARAS, MD   [facility] Providers Cardiologist:  Shelda Bruckner, MD     Referring MD: Geofm Glade PARAS, MD   Chief Complaint  Patient presents with   Follow-up    History of Present Illness:    Ronald Stafford is a 89 y.o. male with a hx of hypertension, carotid artery disease, PAD, HLD, TIA, history of DVT.   He presented to the hospital on 04/07/2022, following a fall he had on Monday, on Tuesday he had severe left-sided chest pain that lasted for a few minutes.  He had no other associated cardiac symptoms.  Because of this he made an appointment to see his primary care provider, his troponin level was checked and it was elevated at 60.  He was advised  to go the ED. In the ED, troponin was 70, BP was 193/95. Coronary CT was obtained which showed prominent aortic atherosclerosis. Echo obtained, largely normal and no further indication for inpatient ischemic evaluation.   Evaluated by his PCP on 04/10/22, BP was well controlled 130/58. Repeat lab work was overall stable.  He presents today for follow up of his recent admission. He has been feeling well overall, states he has recently gotten my appetite back. He has had two other episodes of chest pain since his discharge, both lasting ~ 5 minutes, substernal, sharp in nature, resolves with time. He has been checking his BP daily and states that it is mostly 120-130's, with occasional 156. Denies any chest pain, palpitations, dyspnea, pnd, orthopnea, n, v, dizziness, syncope, edema, weight gain, or early satiety.  Past Medical History:  Diagnosis Date   Cancer [facility]) 2004   prostate cancer, Dr. Alline   Colon polyps    Deep venous thrombosis (HCC) 1997   Postop   Depression    GERD (gastroesophageal reflux disease)    Hyperlipidemia    Hypertension    Radial head fracture, closed 09/18/2013   Shoulder  fracture, left 02/23/2011   immobilized in sling , Dr Norris(no surgery)    Past Surgical History:  Procedure Laterality Date   COLONOSCOPY  2006   Negative,Dr.Edwards   colonoscopy with polypectomy      PMH of   ESOPHAGOGASTRODUODENOSCOPY N/A 02/23/2017   Procedure: ESOPHAGOGASTRODUODENOSCOPY (EGD);  Surgeon: Elicia Claw, MD;  Location: [facility];  Service: Gastroenterology;  Laterality: N/A;   JOINT REPLACEMENT  1997, 2001   Total Hip replacement X 2   LUMBAR SPINE SURGERY  09/28/2010   Dr Burnetta; for spinal stenosis   MECKEL DIVERTICULUM EXCISION     Age 66   PROSTATECTOMY  2004   w/ radiation, Dr Alline   TOTAL HIP REVISION Left 02/14/2017   Procedure: Left hip polyethylene EXCHANGE;  Surgeon: Melodi Lerner, MD;  Location: [facility];  Service: Orthopedics;  Laterality: Left;   VASECTOMY      Current Medications: Current Meds  Medication Sig   acetaminophen  (TYLENOL ) 500 MG tablet Take 1,000 mg by mouth every 6 (six) hours as needed for mild pain.   atorvastatin  (LIPITOR) 40 MG tablet Take 1 tablet (40 mg total) by mouth daily.   carvedilol  (COREG ) 6.25 MG tablet Take 1 tablet (6.25 mg total) by mouth 2 (two) times daily with a meal.   clopidogrel  (PLAVIX ) 75 MG tablet Take 1 tablet (75 mg total) by mouth every morning.  ipratropium-albuterol  (DUONEB) 0.5-2.5 (3) MG/3ML SOLN Take 3 mLs by nebulization every 6 (six) hours as needed (shortness of breath).   meclizine  (ANTIVERT ) 12.5 MG tablet Take 1 tablet (12.5 mg total) by mouth 3 (three) times daily as needed for dizziness.   mirabegron  ER (MYRBETRIQ ) 25 MG TB24 tablet Take 1 tablet (25 mg total) by mouth daily as needed. (Patient taking differently: Take 25 mg by mouth daily as needed (bladder control).)   Omega-3 Fatty Acids (FISH OIL) 500 MG CAPS Take 500 mg by mouth daily.   valsartan  (DIOVAN ) 80 MG tablet Take 1 tablet (80 mg total) by mouth daily.   vitamin C (ASCORBIC ACID) 250 MG tablet Take 250 mg by mouth  daily.     Allergies:   Chicken allergy   Social History   Socioeconomic History   Marital status: Widowed    Spouse name: Not on file   Number of children: 2   Years of education: Not on file   Highest education level: Not on file  Occupational History   Occupation: retired  Tobacco Use   Smoking status: Former    Packs/day: 0.50    Years: 5.00    Total pack years: 2.50    Types: Cigarettes    Quit date: 03/13/1988    Years since quitting: 34.1   Smokeless tobacco: Former   Tobacco comments:    Quit at about age 84 / chewed tobacca x 1 year  Vaping Use   Vaping Use: Never used  Substance and Sexual Activity   Alcohol use: No    Alcohol/week: 0.0 standard drinks of alcohol   Drug use: No   Sexual activity: Never  Other Topics Concern   Not on file  Social History Narrative   Lives alone; Wife passed 12/18/12;   Died of ovarian cancer; breast cancer; other cancer;      Social Determinants of Health   Financial Resource Strain: Low Risk  (08/26/2021)   Overall Financial Resource Strain (CARDIA)    Difficulty of Paying Living Expenses: Not hard at all  Food Insecurity: No Food Insecurity (04/13/2022)   Hunger Vital Sign    Worried About Running Out of Food in the Last Year: Never true    Ran Out of Food in the Last Year: Never true  Transportation Needs: No Transportation Needs (04/13/2022)   PRAPARE - Administrator, Civil Service (Medical): No    Lack of Transportation (Non-Medical): No  Physical Activity: Insufficiently Active (08/26/2021)   Exercise Vital Sign    Days of Exercise per Week: 3 days    Minutes of Exercise per Session: 30 min  Stress: No Stress Concern Present (08/26/2021)   [employer] of Occupational Health - Occupational Stress Questionnaire    Feeling of Stress : Not at all  Social Connections: Moderately Isolated (04/13/2022)   Social Connection and Isolation Panel [NHANES]    Frequency of Communication with Friends and  Family: More than three times a week    Frequency of Social Gatherings with Friends and Family: Three times a week    Attends Religious Services: More than 4 times per year    Active Member of Clubs or Organizations: No    Attends Banker Meetings: Never    Marital Status: Widowed     Family History: The patient's family history includes Prostate cancer in his father; Testicular cancer in his son.  ROS:   Please see the history of present illness.  All other systems reviewed and are negative.  EKGs/Labs/Other Studies Reviewed:    The following studies were reviewed today:   04/07/22 echo complete -EF 60 to 65%, grade 1 diastolic dysfunction.  Mild calcification of the aortic valve.  Mild thickening of the aortic valve, sclerosis is present without evidence of stenosis.  04/07/22 CT angio chest/ABD/PEL -  1. No acute thoracic or abdominal aorta abnormality. 2. Aortic Atherosclerosis (ICD10-I70.0) - severe with severe (likely chronic) narrowing of the origin/proximal celiac and superior mesenteric arteries with opacification distally. No findings to suggest associated ischemia of the abdominal or pelvic organs. 3. Tortuosity of the distal superior mesenteric artery. 4. Right lower lobe 8 mm subpleural nodule. Non-contrast chest CT at 6-12 months is recommended. If the nodule is stable at time of repeat CT, then future CT at 18-24 months (from today's scan) is considered optional for low-risk patients, but is recommended for high-risk patients  EKG:  EKG is not ordered today. EKG reviewed from hospital admission, SR, normal EKG.   Recent Labs: 04/06/2022: ALT 27 04/10/2022: BUN 23; Creatinine, Ser 0.94; Hemoglobin 12.7; Magnesium 2.1; Platelets 161.0; Potassium 4.5; Sodium 146  Recent Lipid Panel    Component Value Date/Time   CHOL 102 04/07/2022 0517   TRIG 55 04/07/2022 0517   HDL 37 (L) 04/07/2022 0517   CHOLHDL 2.8 04/07/2022 0517   VLDL 11 04/07/2022 0517    LDLCALC 54 04/07/2022 0517     Risk Assessment/Calculations:      HYPERTENSION CONTROL Vitals:   04/17/22 1415 04/17/22 1450  BP: (!) 150/76 (!) 160/78    The patient's blood pressure is elevated above target today.  In order to address the patient's elevated BP: Blood pressure will be monitored at home to determine if medication changes need to be made.            Physical Exam:    VS:  BP (!) 160/78   Pulse 76   Ht 6' 2 (1.88 m)   Wt 183 lb (83 kg)   BMI 23.50 kg/m     Wt Readings from Last 3 Encounters:  04/17/22 183 lb (83 kg)  04/10/22 180 lb (81.6 kg)  04/06/22 180 lb (81.6 kg)     GEN:  Well nourished, well developed in no acute distress HEENT: Normal NECK: No JVD; No carotid bruits LYMPHATICS: No lymphadenopathy CARDIAC: RRR, no murmurs, rubs, gallops RESPIRATORY:  Clear to auscultation without rales, wheezing or rhonchi  ABDOMEN: Soft, non-tender, non-distended MUSCULOSKELETAL:  No edema; No deformity  SKIN: Warm and dry NEUROLOGIC:  Alert and oriented x 3 PSYCHIATRIC:  Normal affect   ASSESSMENT:    1. CAD in native artery   2. Chest pain of uncertain etiology   3. Essential hypertension   4. PAD (peripheral artery disease) (HCC)    PLAN:    In order of problems listed above:  CAD/chest pain - chest pain has mixed features, pain is intermittent and occurs at rest, however he has other risk factors and noted atherosclerosis on his chest CT. Discussed options for ischemic testing and he is amenable to proceed with Lexiscan . On Plavix  (for PAD), on carvedilol .  HTN - BP today 150/76, repeat BP 160/78. Daily BP check, discussed proper techniques for monitoring. We will call him in 1 week to check on his BP readings. Continue Diovan , and carvedilol . Could consider increasing his Diovan  if his BP remains sub optimal.  PAD - denies claudication, on Plavix  per vascular.  Shared Decision Making/Informed Consent The risks [chest pain, shortness  of breath, cardiac arrhythmias, dizziness, blood pressure fluctuations, myocardial infarction, stroke/transient ischemic attack, nausea, vomiting, allergic reaction, radiation exposure, metallic taste sensation and life-threatening complications (estimated to be 1 in 10,000)], benefits (risk stratification, diagnosing coronary artery disease, treatment guidance) and alternatives of a nuclear stress test were discussed in detail with Mr. Banfill and he agrees to proceed.    Disposition - Lexiscan , return in 6-8 weeks with Dr. Lonni.    Medication Adjustments/Labs and Tests Ordered: Current medicines are reviewed at length with the patient today.  Concerns regarding medicines are outlined above.  Orders Placed This Encounter  Procedures   MYOCARDIAL PERFUSION IMAGING   No orders of the defined types were placed in this encounter.   Patient Instructions  Medication Instructions:  Your Physician recommend you continue on your current medication as directed.    *If you need a refill on your cardiac medications before your next appointment, please call your pharmacy*  Testing/Procedures: Your Physician has requested you have a Myocardial Perfusion Imaging Study.  Please arrive 15 minutes prior to your appointment time for registration and insurance purposes.   The test will take approximately 3 to 4 hours to complete; you may bring reading material. If someone comes with you to your appointment, they will need to remain in the main lobby due to limited testing space in the testing area. **If you are pregnant or breastfeeding, please notify the nuclear lab prior to your appointment**   How to prepare for your test:  - Do not eat or drink 3 hours prior to your test, except you may have water  - Do not consume products containing caffeine ( regular or decaf) 12 hours prior to your test ( coffee, chocolate, sodas or teas)  - Do bring a list of your current medications with you. If not listed  below, you may take your medications as normal (Hold beta blocker- 24 hour prior for exercise myoview )   - Do wear comfortable clothes (no dresses or overalls) and walking shoes ( tennis shoes preferred), no heel or open toe shoes are allowed - Do not wear cologne, perfume, aftershave, or lotions ( deodorant is allowed)  - If these instructions are not followed, your test will be rescheduled   If you cannot keep your appointment, please provide 24 hours notice to the Nuclear Lab, to avoid a possible $50 charge to your account!   Follow-Up: At [facility], you and your health needs are our priority.  As part of our continuing mission to provide you with exceptional heart care, we have created designated Provider Care Teams.  These Care Teams include your primary Cardiologist (physician) and Advanced Practice Providers (APPs -  Physician Assistants and Nurse Practitioners) who all work together to provide you with the care you need, when you need it.  We recommend signing up for the patient portal called MyChart.  Sign up information is provided on this After Visit Summary.  MyChart is used to connect with patients for Virtual Visits (Telemedicine).  Patients are able to view lab/test results, encounter notes, upcoming appointments, etc.  Non-urgent messages can be sent to your provider as well.   To learn more about what you can do with MyChart, go to [email or web address].    Your next appointment:   6-8 week(s)  Provider:   Shelda Lonni, MD or Reche Finder, NP    Other Instructions Erma will call you in one week to check  on your blood pressure readings!   Heart Healthy Diet Recommendations: A low-salt diet is recommended. Meats should be grilled, baked, or boiled. Avoid fried foods. Focus on lean protein sources like fish or chicken with vegetables and fruits. The American Heart Association is a Chief Technology Officer!  American Heart Association Diet and Lifeystyle  Recommendations   Exercise recommendations: The American Heart Association recommends 150 minutes of moderate intensity exercise weekly. Try 30 minutes of moderate intensity exercise 4-5 times per week. This could include walking, jogging, or swimming.     Signed, Delon JAYSON Hoover, NP  04/17/2022 3:07 PM    [facility]

## 2022-04-17 ENCOUNTER — Encounter (HOSPITAL_BASED_OUTPATIENT_CLINIC_OR_DEPARTMENT_OTHER): Payer: Self-pay | Admitting: Cardiology

## 2022-04-17 ENCOUNTER — Ambulatory Visit (HOSPITAL_BASED_OUTPATIENT_CLINIC_OR_DEPARTMENT_OTHER): Payer: Medicare Other | Admitting: Cardiology

## 2022-04-17 VITALS — BP 160/78 | HR 76 | Ht 74.0 in | Wt 183.0 lb

## 2022-04-17 DIAGNOSIS — I10 Essential (primary) hypertension: Secondary | ICD-10-CM | POA: Diagnosis not present

## 2022-04-17 DIAGNOSIS — R079 Chest pain, unspecified: Secondary | ICD-10-CM | POA: Diagnosis not present

## 2022-04-17 DIAGNOSIS — I2510 Atherosclerotic heart disease of native coronary artery without angina pectoris: Secondary | ICD-10-CM | POA: Diagnosis not present

## 2022-04-17 DIAGNOSIS — I739 Peripheral vascular disease, unspecified: Secondary | ICD-10-CM | POA: Diagnosis not present

## 2022-04-17 NOTE — Patient Instructions (Signed)
Medication Instructions:  Your Physician recommend you continue on your current medication as directed.    *If you need a refill on your cardiac medications before your next appointment, please call your pharmacy*  Testing/Procedures: Your Physician has requested you have a Myocardial Perfusion Imaging Study.  Please arrive 15 minutes prior to your appointment time for registration and insurance purposes.   The test will take approximately 3 to 4 hours to complete; you may bring reading material. If someone comes with you to your appointment, they will need to remain in the main lobby due to limited testing space in the testing area. **If you are pregnant or breastfeeding, please notify the nuclear lab prior to your appointment**   How to prepare for your test:  - Do not eat or drink 3 hours prior to your test, except you may have water  - Do not consume products containing caffeine ( regular or decaf) 12 hours prior to your test ( coffee, chocolate, sodas or teas)  - Do bring a list of your current medications with you. If not listed below, you may take your medications as normal (Hold beta blocker- 24 hour prior for exercise myoview)   - Do wear comfortable clothes (no dresses or overalls) and walking shoes ( tennis shoes preferred), no heel or open toe shoes are allowed - Do not wear cologne, perfume, aftershave, or lotions ( deodorant is allowed)  - If these instructions are not followed, your test will be rescheduled   If you cannot keep your appointment, please provide 24 hours notice to the Nuclear Lab, to avoid a possible $50 charge to your account!   Follow-Up: At [facility], you and your health needs are our priority.  As part of our continuing mission to provide you with exceptional heart care, we have created designated Provider Care Teams.  These Care Teams include your primary Cardiologist (physician) and Advanced Practice Providers (APPs -  Physician Assistants and  Nurse Practitioners) who all work together to provide you with the care you need, when you need it.  We recommend signing up for the patient portal called "MyChart".  Sign up information is provided on this After Visit Summary.  MyChart is used to connect with patients for Virtual Visits (Telemedicine).  Patients are able to view lab/test results, encounter notes, upcoming appointments, etc.  Non-urgent messages can be sent to your provider as well.   To learn more about what you can do with MyChart, go to [email or web address].    Your next appointment:   6-8 week(s)  Provider:   Buford Dresser, MD or Laurann Montana, NP    Other Instructions Daphene Jaeger will call you in one week to check on your blood pressure readings!   Heart Healthy Diet Recommendations: A low-salt diet is recommended. Meats should be grilled, baked, or boiled. Avoid fried foods. Focus on lean protein sources like fish or chicken with vegetables and fruits. The American Heart Association is a Microbiologist!  American Heart Association Diet and Lifeystyle Recommendations   Exercise recommendations: The American Heart Association recommends 150 minutes of moderate intensity exercise weekly. Try 30 minutes of moderate intensity exercise 4-5 times per week. This could include walking, jogging, or swimming.

## 2022-04-19 NOTE — Addendum Note (Signed)
Addended by: Ferol Luz, Kennethia Lynes P on: 04/19/2022 04:52 PM   Modules accepted: Orders

## 2022-04-20 ENCOUNTER — Telehealth (HOSPITAL_COMMUNITY): Payer: Self-pay | Admitting: *Deleted

## 2022-04-20 NOTE — Telephone Encounter (Signed)
Patient given detailed instructions per Myocardial Perfusion Study Information Sheet for the test on 04/24/2022 at 1:00. Patient notified to arrive 15 minutes early and that it is imperative to arrive on time for appointment to keep from having the test rescheduled.  If you need to cancel or reschedule your appointment, please call the office within 24 hours of your appointment. . Patient verbalized understanding.Ronald Stafford

## 2022-04-24 ENCOUNTER — Ambulatory Visit (HOSPITAL_COMMUNITY): Payer: Medicare Other | Attending: Internal Medicine

## 2022-04-24 ENCOUNTER — Encounter (HOSPITAL_BASED_OUTPATIENT_CLINIC_OR_DEPARTMENT_OTHER): Payer: Self-pay

## 2022-04-24 DIAGNOSIS — I2510 Atherosclerotic heart disease of native coronary artery without angina pectoris: Secondary | ICD-10-CM | POA: Insufficient documentation

## 2022-04-24 LAB — MYOCARDIAL PERFUSION IMAGING
LV dias vol: 76 mL (ref 62–150)
LV sys vol: 33 mL
Nuc Stress EF: 57 %
Peak HR: 93 {beats}/min
Rest HR: 68 {beats}/min
Rest Nuclear Isotope Dose: 10.1 mCi
SDS: 1
SRS: 1
SSS: 2
ST Depression (mm): 0 mm
Stress Nuclear Isotope Dose: 30.4 mCi
TID: 1.03

## 2022-04-24 MED ORDER — TECHNETIUM TC 99M TETROFOSMIN IV KIT
30.4000 | PACK | Freq: Once | INTRAVENOUS | Status: AC | PRN
  Administered 2022-04-24: 30.4 via INTRAVENOUS

## 2022-04-24 MED ORDER — TECHNETIUM TC 99M TETROFOSMIN IV KIT
10.1000 | PACK | Freq: Once | INTRAVENOUS | Status: AC | PRN
  Administered 2022-04-24: 10.1 via INTRAVENOUS

## 2022-04-24 MED ORDER — REGADENOSON 0.4 MG/5ML IV SOLN
0.4000 mg | Freq: Once | INTRAVENOUS | Status: AC
  Administered 2022-04-24: 0.4 mg via INTRAVENOUS

## 2022-05-03 ENCOUNTER — Emergency Department (HOSPITAL_COMMUNITY): Payer: Medicare Other

## 2022-05-03 ENCOUNTER — Other Ambulatory Visit: Payer: Self-pay

## 2022-05-03 ENCOUNTER — Emergency Department (HOSPITAL_COMMUNITY)
Admission: EM | Admit: 2022-05-03 | Discharge: 2022-05-03 | Disposition: A | Discharge status: Home / Self Care | Payer: Medicare Other | Attending: Emergency Medicine | Admitting: Emergency Medicine

## 2022-05-03 ENCOUNTER — Encounter (HOSPITAL_COMMUNITY): Payer: Self-pay

## 2022-05-03 DIAGNOSIS — M545 Low back pain, unspecified: Secondary | ICD-10-CM | POA: Diagnosis not present

## 2022-05-03 DIAGNOSIS — S7001XA Contusion of right hip, initial encounter: Secondary | ICD-10-CM | POA: Insufficient documentation

## 2022-05-03 DIAGNOSIS — Y92002 Bathroom of unspecified non-institutional (private) residence single-family (private) house as the place of occurrence of the external cause: Secondary | ICD-10-CM | POA: Insufficient documentation

## 2022-05-03 DIAGNOSIS — R600 Localized edema: Secondary | ICD-10-CM | POA: Diagnosis not present

## 2022-05-03 DIAGNOSIS — W19XXXA Unspecified fall, initial encounter: Secondary | ICD-10-CM

## 2022-05-03 DIAGNOSIS — I10 Essential (primary) hypertension: Secondary | ICD-10-CM | POA: Insufficient documentation

## 2022-05-03 DIAGNOSIS — Z79899 Other long term (current) drug therapy: Secondary | ICD-10-CM | POA: Diagnosis not present

## 2022-05-03 DIAGNOSIS — W182XXA Fall in (into) shower or empty bathtub, initial encounter: Secondary | ICD-10-CM | POA: Diagnosis not present

## 2022-05-03 DIAGNOSIS — R6889 Other general symptoms and signs: Secondary | ICD-10-CM | POA: Diagnosis not present

## 2022-05-03 DIAGNOSIS — T148XXA Other injury of unspecified body region, initial encounter: Secondary | ICD-10-CM

## 2022-05-03 DIAGNOSIS — Z7902 Long term (current) use of antithrombotics/antiplatelets: Secondary | ICD-10-CM | POA: Diagnosis not present

## 2022-05-03 DIAGNOSIS — Z743 Need for continuous supervision: Secondary | ICD-10-CM | POA: Diagnosis not present

## 2022-05-03 DIAGNOSIS — Z96641 Presence of right artificial hip joint: Secondary | ICD-10-CM | POA: Diagnosis not present

## 2022-05-03 DIAGNOSIS — M25551 Pain in right hip: Secondary | ICD-10-CM | POA: Diagnosis not present

## 2022-05-03 NOTE — ED Notes (Signed)
Patient transported to X-ray 

## 2022-05-03 NOTE — ED Provider Triage Note (Signed)
Emergency Medicine Provider Triage Evaluation Note  Ronald Stafford , a 89 y.o. male  was evaluated in triage.  Pt complains of right hip pain.  Pt reports he fell while getting out of tub.  Pt has pain in right hip and difficulty walking    Review of Systems  Positive: fall Negative: No impact of head no loc Physical Exam  BP (!) 146/67 (BP Location: Left Arm)   Pulse 75   Temp 97.7 F (36.5 C) (Oral)   Resp 20   Ht 6' 2"$  (1.88 m)   Wt 83 kg   SpO2 98%   BMI 23.50 kg/m  Gen:   Awake, no distress   Resp:  Normal effort  MSK:   Pain right hip, pain with movement,    Other: Diffculty ambulating   Medical Decision Making  Medically screening exam initiated at 2:41 PM.  Appropriate orders placed.  Ronald Stafford was informed that the remainder of the evaluation will be completed by another provider, this initial triage assessment does not replace that evaluation, and the importance of remaining in the ED until their evaluation is complete.     Fransico Meadow, [place] 05/03/22 1443

## 2022-05-03 NOTE — ED Provider Notes (Signed)
[facility] Provider Note   CSN: [id number] Arrival date & time: 05/03/22  1403     History  Chief Complaint  Patient presents with   Fall   Hip Pain    Ronald Stafford is a 89 y.o. male.   Fall  Hip Pain     89 year old male presenting to the emergency department after a fall.  The patient has a history of hypertension, hyperlipidemia, DVT not on anticoagulation patient states he is on Plavix who presents to the emergency department after a fall.  The patient states he had a fall a week ago and landed on his buttocks.  He was attempting to rest in a hot tub earlier tonight and fell while getting out of the hot tub landing on his right thigh and hip.  He endorses pain on attempts at range of motion of the hip.  He denies any head trauma or loss of consciousness.  He has been ambulatory and has been able to bear weight since the fall.  He arrived to the emergency department GCS 15, ABC intact.  Home Medications Prior to Admission medications   Medication Sig Start Date End Date Taking? Authorizing Provider  acetaminophen (TYLENOL) 500 MG tablet Take 1,000 mg by mouth every 6 (six) hours as needed for mild pain.    [provider]  atorvastatin (LIPITOR) 40 MG tablet Take 1 tablet (40 mg total) by mouth daily. 04/10/22   Binnie Rail, MD  carvedilol (COREG) 6.25 MG tablet Take 1 tablet (6.25 mg total) by mouth 2 (two) times daily with a meal. 04/10/22   Burns, Claudina Lick, MD  clopidogrel (PLAVIX) 75 MG tablet Take 1 tablet (75 mg total) by mouth every morning. 04/10/22   Burns, Claudina Lick, MD  ipratropium-albuterol (DUONEB) 0.5-2.5 (3) MG/3ML SOLN Take 3 mLs by nebulization every 6 (six) hours as needed (shortness of breath).    [provider]  meclizine (ANTIVERT) 12.5 MG tablet Take 1 tablet (12.5 mg total) by mouth 3 (three) times daily as needed for dizziness. 12/30/21   Binnie Rail, MD  mirabegron ER (MYRBETRIQ) 25 MG  TB24 tablet Take 1 tablet (25 mg total) by mouth daily as needed. Patient taking differently: Take 25 mg by mouth daily as needed (bladder control). 07/04/21   Binnie Rail, MD  Omega-3 Fatty Acids (FISH OIL) 500 MG CAPS Take 500 mg by mouth daily.    [provider]  valsartan (DIOVAN) 80 MG tablet Take 1 tablet (80 mg total) by mouth daily. 04/10/22   Binnie Rail, MD  vitamin C (ASCORBIC ACID) 250 MG tablet Take 250 mg by mouth daily.    [provider]      Allergies    Chicken allergy    Review of Systems   Review of Systems  All other systems reviewed and are negative.   Physical Exam Updated Vital Signs BP 136/66 (BP Location: Right Arm)   Pulse (!) 103   Temp 97.9 F (36.6 C)   Resp 15   Ht 6' 2"$  (1.88 m)   Wt 83 kg   SpO2 99%   BMI 23.50 kg/m  Physical Exam Vitals and nursing note reviewed.  Constitutional:      General: He is not in acute distress. HENT:     Head: Normocephalic and atraumatic.  Eyes:     Conjunctiva/sclera: Conjunctivae normal.     Pupils: Pupils are equal, round, and reactive to  light.  Cardiovascular:     Rate and Rhythm: Normal rate and regular rhythm.  Pulmonary:     Effort: Pulmonary effort is normal. No respiratory distress.  Abdominal:     General: There is no distension.     Tenderness: There is no guarding.  Musculoskeletal:        General: No deformity or signs of injury.     Cervical back: Neck supple.     Comments: Tenderness to palpation about the right buttocks and right quadriceps.  Intact range of motion of the right hip, right knee. 2+ DP pulses.  Skin:    Findings: No lesion or rash.  Neurological:     General: No focal deficit present.     Mental Status: He is alert. Mental status is at baseline.     ED Results / Procedures / Treatments   Labs (all labs ordered are listed, but only abnormal results are displayed) Labs Reviewed - No data to display  EKG None  Radiology CT Hip Right Wo  Contrast  Result Date: 05/03/2022 CLINICAL DATA:  [place] out of bathtub, right hip pain, negative x-ray EXAM: CT OF THE RIGHT HIP WITHOUT CONTRAST TECHNIQUE: Multidetector CT imaging of the right hip was performed according to the standard protocol. Multiplanar CT image reconstructions were also generated. RADIATION DOSE REDUCTION: This exam was performed according to the departmental dose-optimization program which includes automated exposure control, adjustment of the mA and/or kV according to patient size and/or use of iterative reconstruction technique. COMPARISON:  05/03/2022 FINDINGS: Bones/Joint/Cartilage There are no acute displaced fractures. Alignment is anatomic. There is moderate right hip osteoarthritis, with joint space narrowing and marginal osteophyte formation noted. Visualized portions of the pelvis are unremarkable. Ligaments Suboptimally assessed by CT. Muscles and Tendons There is likely intramuscular hematoma involving the inferolateral aspect of the gluteus maximus muscle, which measures 4.7 cm in thickness reference image 63/10. Remaining muscular structures are unremarkable. Soft tissues Subcutaneous edema is seen overlying the posterolateral aspect of the right hip. Visualized intrapelvic structures are grossly unremarkable. Prior prostatectomy. Reconstructed images demonstrate no additional findings. IMPRESSION: 1. Likely intramuscular hematoma involving the inferolateral aspect of the right gluteus maximus muscle. There is overlying subcutaneous edema consistent with direct trauma. 2. No acute displaced fracture. 3. Moderate right hip osteoarthritis. Electronically Signed   By: Randa Ngo M.D.   On: 05/03/2022 17:49   DG Hip Unilat  With Pelvis 2-3 Views Right  Result Date: 05/03/2022 CLINICAL DATA:  89 year old male with history of right hip pain after fall. EXAM: DG HIP (WITH OR WITHOUT PELVIS) 2-3V RIGHT COMPARISON:  02/14/2017 FINDINGS: There is no evidence of hip fracture or  dislocation. There is no evidence of arthropathy or other focal bone abnormality. Postsurgical changes after left total hip arthroplasty, partially visualized, without complicating features. IMPRESSION: No acute fracture or malalignment. Electronically Signed   By: Ruthann Cancer M.D.   On: 05/03/2022 14:34    Procedures Procedures    Medications Ordered in ED Medications - No data to display  ED Course/ Medical Decision Making/ A&P                             Medical Decision Making Amount and/or Complexity of Data Reviewed Radiology: ordered.    89 year old male presenting to the emergency department after a fall.  The patient has a history of hypertension, hyperlipidemia, DVT not on anticoagulation patient states he is on Plavix who presents to  the emergency department after a fall.  The patient states he had a fall a week ago and landed on his buttocks.  He was attempting to rest in a hot tub earlier tonight and fell while getting out of the hot tub landing on his right thigh and hip.  He endorses pain on attempts at range of motion of the hip.  He denies any head trauma or loss of consciousness.  He has been ambulatory and has been able to bear weight since the fall.  He arrived to the emergency department GCS 15, ABC intact.  On arrival, the patient was vitally stable, physical exam as per above significant for tenderness of the right quadriceps and right buttocks.  Differential diagnosis includes pelvic fracture, femur fracture, hip fracture.  X-ray imaging of the hips and pelvis was obtained and was negative for acute fracture.  CT of the right hip was also obtained and results are as follows: IMPRESSION:  1. Likely intramuscular hematoma involving the inferolateral aspect  of the right gluteus maximus muscle. There is overlying subcutaneous  edema consistent with direct trauma.  2. No acute displaced fracture.  3. Moderate right hip osteoarthritis.    Patient informed of his  diagnosis of intramuscular hematoma.  No evidence of hip fracture or skeletal injury and the patient is able to bear weight and ambulate in the emergency department.  Recommended rest, ice and heat, elevation of the extremity, Tylenol and NSAIDs for pain control and outpatient follow-up with his PCP as needed.  Stable for discharge.  Final Clinical Impression(s) / ED Diagnoses Final diagnoses:  Fall, initial encounter  Hematoma    Rx / DC Orders ED Discharge Orders     None         Regan Lemming, MD 05/03/22 1859

## 2022-05-03 NOTE — Discharge Instructions (Signed)
Recommend rest, elevation of the extremity, ice and heat, tylenol and nsaids for pain control

## 2022-05-03 NOTE — ED Triage Notes (Signed)
Per EMS, Pt, from home, c/o R hip pain following a slip and fall while getting out of the shower.  Pain score 10/10 w/ ambulation.  No shortening or rotation noted.  Pt is on Plavix.  Denies hitting head, neck, and back.

## 2022-05-03 NOTE — ED Notes (Signed)
Patient transported to CT 

## 2022-05-04 ENCOUNTER — Other Ambulatory Visit: Payer: Medicare Other

## 2022-05-07 ENCOUNTER — Encounter: Payer: Self-pay | Admitting: Internal Medicine

## 2022-05-07 NOTE — Progress Notes (Unsigned)
Subjective:    Patient ID: Ronald Stafford, male    DOB: 1930-06-18, 89 y.o.   MRN: [id number]     HPI Ronald Stafford is here for follow up from the hospital  ED 2/21 after fall at home c/o hip pain.  He had fallen one week prior and landed on his buttocks.  The day he went to the ED he fell getting out of a hot tub and landed on his right thigh and hip.  He denied head trauma or LOC.  He was ambulatory prior to go to the ED.  Xray of hip showed no fx.  Ct hip showed muscle hematoma of inferolateral aspect of right gluteus maximus muscle with overlying subcutaneous edema. No fx.  Moderate right hip OA.  He was advised, rest, ice, heat, elevation, tylenol and nsaids.     He is taking tylenol and it is helping.  He is doing ice, heat.  His pain is better and improving daily.  He uses a cane outside the house and a walker inside the house to ambulate.   Medications and allergies reviewed with patient and updated if appropriate.  Current Outpatient Medications on File Prior to Visit  Medication Sig Dispense Refill   acetaminophen (TYLENOL) 500 MG tablet Take 1,000 mg by mouth every 6 (six) hours as needed for mild pain.     atorvastatin (LIPITOR) 40 MG tablet Take 1 tablet (40 mg total) by mouth daily. 90 tablet 2   carvedilol (COREG) 6.25 MG tablet Take 1 tablet (6.25 mg total) by mouth 2 (two) times daily with a meal. 60 tablet 2   clopidogrel (PLAVIX) 75 MG tablet Take 1 tablet (75 mg total) by mouth every morning. 90 tablet 5   ipratropium-albuterol (DUONEB) 0.5-2.5 (3) MG/3ML SOLN Take 3 mLs by nebulization every 6 (six) hours as needed (shortness of breath).     meclizine (ANTIVERT) 12.5 MG tablet Take 1 tablet (12.5 mg total) by mouth 3 (three) times daily as needed for dizziness. 60 tablet 2   mirabegron ER (MYRBETRIQ) 25 MG TB24 tablet Take 1 tablet (25 mg total) by mouth daily as needed. (Patient taking differently: Take 25 mg by mouth daily as needed (bladder control).) 90 tablet 1    Omega-3 Fatty Acids (FISH OIL) 500 MG CAPS Take 500 mg by mouth daily.     valsartan (DIOVAN) 80 MG tablet Take 1 tablet (80 mg total) by mouth daily. 90 tablet 3   vitamin C (ASCORBIC ACID) 250 MG tablet Take 250 mg by mouth daily.     No current facility-administered medications on file prior to visit.     Review of Systems  Constitutional:  Negative for fever.  Musculoskeletal:  Negative for back pain.  Neurological:  Positive for light-headedness (improved) and numbness (occ).       Objective:   Vitals:   05/09/22 0957  BP: 130/72  Pulse: 73  Temp: 97.7 F (36.5 C)  SpO2: 92%   BP Readings from Last 3 Encounters:  05/09/22 130/72  05/03/22 136/66  04/17/22 (!) 160/78   Wt Readings from Last 3 Encounters:  05/09/22 184 lb (83.5 kg)  05/03/22 183 lb (83 kg)  04/24/22 183 lb (83 kg)   Body mass index is 23.62 kg/m.    Physical Exam Constitutional:      General: He is not in acute distress.    Appearance: Normal appearance. He is not ill-appearing.  HENT:     Head: Normocephalic and  atraumatic.  Eyes:     Conjunctiva/sclera: Conjunctivae normal.  Cardiovascular:     Rate and Rhythm: Normal rate and regular rhythm.     Heart sounds: Normal heart sounds.  Pulmonary:     Effort: Pulmonary effort is normal. No respiratory distress.     Breath sounds: Normal breath sounds. No wheezing or rales.  Musculoskeletal:     Right lower leg: No edema.     Left lower leg: No edema.  Skin:    General: Skin is warm and dry.     Findings: No rash.  Neurological:     Mental Status: He is alert. Mental status is at baseline.  Psychiatric:        Mood and Affect: Mood normal.        Lab Results  Component Value Date   WBC 5.9 04/10/2022   HGB 12.7 (L) 04/10/2022   HCT 38.9 (L) 04/10/2022   PLT 161.0 04/10/2022   GLUCOSE 107 (H) 04/10/2022   CHOL 102 04/07/2022   TRIG 55 04/07/2022   HDL 37 (L) 04/07/2022   LDLCALC 54 04/07/2022   ALT 27 04/06/2022   AST 27  04/06/2022   NA 146 (H) 04/10/2022   K 4.5 04/10/2022   CL 109 04/10/2022   CREATININE 0.94 04/10/2022   BUN 23 04/10/2022   CO2 30 04/10/2022   TSH 3.23 10/31/2018   PSA 0.00 (L) 10/31/2018   INR 1.1 04/07/2022   HGBA1C 5.9 06/02/2021     Assessment & Plan:    See Problem List for Assessment and Plan of chronic medical problems.

## 2022-05-09 ENCOUNTER — Ambulatory Visit (INDEPENDENT_AMBULATORY_CARE_PROVIDER_SITE_OTHER): Payer: Medicare Other | Admitting: Internal Medicine

## 2022-05-09 ENCOUNTER — Telehealth: Payer: Self-pay | Admitting: *Deleted

## 2022-05-09 VITALS — BP 130/72 | HR 73 | Temp 97.7°F | Ht 74.0 in | Wt 184.0 lb

## 2022-05-09 DIAGNOSIS — R296 Repeated falls: Secondary | ICD-10-CM | POA: Diagnosis not present

## 2022-05-09 DIAGNOSIS — I10 Essential (primary) hypertension: Secondary | ICD-10-CM | POA: Diagnosis not present

## 2022-05-09 DIAGNOSIS — S300XXA Contusion of lower back and pelvis, initial encounter: Secondary | ICD-10-CM | POA: Diagnosis not present

## 2022-05-09 DIAGNOSIS — T148XXA Other injury of unspecified body region, initial encounter: Secondary | ICD-10-CM | POA: Insufficient documentation

## 2022-05-09 NOTE — Assessment & Plan Note (Signed)
Chronic BP well controlled Continue coreg 6.25 mg bid, valsartan 80 mg daily

## 2022-05-09 NOTE — Assessment & Plan Note (Signed)
Has had frequent falls Using a walker inside the house and cane outside the house Advised to consider walker all the time He will no longer go in the bathtub - just the shower He is taking precautions to prevent falls

## 2022-05-09 NOTE — Patient Instructions (Addendum)
    Medications changes include :   none    

## 2022-05-09 NOTE — Assessment & Plan Note (Addendum)
Acute Hematoma right gluteus maximus after fall earlier this month In the ED CT of the hip showed muscle hematoma at the inferior lateral aspect of right gluteus maximus muscle with overlying subcutaneous edema-no fracture Continue rest, ice, heat, elevation and Tylenol as needed Did not examine area - per patient improved swelling, pain - bruising is still significant F/u if symptoms do not continue to improve or worsen

## 2022-05-09 NOTE — Transitions of Care (Post Inpatient/ED Visit) (Signed)
   05/09/2022  Name: Ronald Stafford MRN: [id number] DOB: 1931/01/03  Today's TOC FU Call Status: Today's TOC FU Call Status:: Successful TOC FU Call Competed TOC FU Call Complete Date: 05/09/22  Transition Care Management Follow-up Telephone Call Date of Discharge: 05/03/22 Discharge Facility: [facility]) Type of Discharge: Emergency Department Reason for ED Visit: Other: (fall) How have you been since you were released from the hospital?: Better (still a little sore/ the ice. hear and tylenol are helping) Any questions or concerns?: No  Items Reviewed: Did you receive and understand the discharge instructions provided?: Yes Medications obtained and verified?: Yes (Medications Reviewed) Any new allergies since your discharge?: No Dietary orders reviewed?: No Do you have support at home?: Yes People in Home: significant other Name of Support/Comfort Primary Source: Girlfriend  [employer] and Equipment/Supplies: [place] Ordered?: No Any new equipment or medical supplies ordered?: No  Functional Questionnaire: Do you need assistance with bathing/showering or dressing?: No Do you need assistance with meal preparation?: No Do you need assistance with eating?: No Do you have difficulty maintaining continence: No Do you need assistance with getting out of bed/getting out of a chair/moving?: No Do you have difficulty managing or taking your medications?: No  Folllow up appointments reviewed: PCP Follow-up appointment confirmed?: Yes Date of PCP follow-up appointment?: 05/09/22 Follow-up Provider: [facility] Follow-up appointment confirmed?: NA Do you need transportation to your follow-up appointment?: No Do you understand care options if your condition(s) worsen?: Yes-patient verbalized understanding  SDOH Interventions Today    Flowsheet Row Most Recent Value  SDOH Interventions   Food Insecurity Interventions Intervention Not Indicated   Housing Interventions Intervention Not Indicated  Transportation Interventions Intervention Not Indicated       [facility] [phone]

## 2022-05-29 ENCOUNTER — Encounter: Payer: Self-pay | Admitting: Internal Medicine

## 2022-05-29 NOTE — Progress Notes (Signed)
Subjective:    Patient ID: Ronald Stafford, male    DOB: 1930-06-22, 89 y.o.   MRN: [id number]      HPI Ronald Stafford is here for  Chief Complaint  Patient presents with   Pain    Right hip pain started 2 weeks ago , had a fall in the tub and was in the ER when he got out his hip was fine now is having some pain     Right hip pain - it hurt after his more recent fall last month.  Then got better.  In the middle of the night last night he sat down hard on the toilet seat and the pain started again.  He thinks he just landed too far-he states he kind of misjudged how far the seat was any kind of fell onto the seat.  Yesterday he could hardly walk when he got up because of the pain.  He does have a bruise there.  He has been applying a salve and has been using heat which helped more than ice.  There is no other injury.  He does not think he broke anything.  He is concerned about the pain and if he needs a hip replacement.  He does have a little bit of pain that radiates down the leg.  It is not as bad as when he fell the first time.     Medications and allergies reviewed with patient and updated if appropriate.  Current Outpatient Medications on File Prior to Visit  Medication Sig Dispense Refill   acetaminophen (TYLENOL) 500 MG tablet Take 1,000 mg by mouth every 6 (six) hours as needed for mild pain.     atorvastatin (LIPITOR) 40 MG tablet Take 1 tablet (40 mg total) by mouth daily. 90 tablet 2   carvedilol (COREG) 6.25 MG tablet Take 1 tablet (6.25 mg total) by mouth 2 (two) times daily with a meal. 60 tablet 2   clopidogrel (PLAVIX) 75 MG tablet Take 1 tablet (75 mg total) by mouth every morning. 90 tablet 5   ipratropium-albuterol (DUONEB) 0.5-2.5 (3) MG/3ML SOLN Take 3 mLs by nebulization every 6 (six) hours as needed (shortness of breath).     meclizine (ANTIVERT) 12.5 MG tablet Take 1 tablet (12.5 mg total) by mouth 3 (three) times daily as needed for dizziness. 60 tablet 2    mirabegron ER (MYRBETRIQ) 25 MG TB24 tablet Take 1 tablet (25 mg total) by mouth daily as needed. (Patient taking differently: Take 25 mg by mouth daily as needed (bladder control).) 90 tablet 1   Omega-3 Fatty Acids (FISH OIL) 500 MG CAPS Take 500 mg by mouth daily.     valsartan (DIOVAN) 80 MG tablet Take 1 tablet (80 mg total) by mouth daily. 90 tablet 3   vitamin C (ASCORBIC ACID) 250 MG tablet Take 250 mg by mouth daily.     No current facility-administered medications on file prior to visit.    Review of Systems  Neurological:  Negative for weakness and numbness.  Hematological:  Bruises/bleeds easily.       Objective:   Vitals:   05/30/22 1416  BP: 124/76  Pulse: 88  Temp: 97.8 F (36.6 C)  SpO2: 98%   BP Readings from Last 3 Encounters:  05/30/22 124/76  05/09/22 130/72  05/03/22 136/66   Wt Readings from Last 3 Encounters:  05/30/22 186 lb (84.4 kg)  05/09/22 184 lb (83.5 kg)  05/03/22 183 lb (83 kg)   Body  mass index is 23.88 kg/m.    Physical Exam Constitutional:      General: He is not in acute distress.    Appearance: Normal appearance. He is not ill-appearing.  HENT:     Head: Normocephalic and atraumatic.  Musculoskeletal:        General: Tenderness (Very tender to mild palpation posterior right trochanter to her and lateral but not region.  No anterior hip pain.  No leg pain with palpation.) present.  Skin:    General: Skin is warm and dry.     Findings: No bruising (Per patient).  Neurological:     Mental Status: He is alert.     Sensory: No sensory deficit.     Motor: No weakness.     Gait: Gait abnormal (Chronic).            Assessment & Plan:    See Problem List for Assessment and Plan of chronic medical problems.

## 2022-05-30 ENCOUNTER — Encounter: Payer: Self-pay | Admitting: Internal Medicine

## 2022-05-30 ENCOUNTER — Ambulatory Visit (INDEPENDENT_AMBULATORY_CARE_PROVIDER_SITE_OTHER): Payer: Medicare Other | Admitting: Internal Medicine

## 2022-05-30 VITALS — BP 124/76 | HR 88 | Temp 97.8°F | Ht 74.0 in | Wt 186.0 lb

## 2022-05-30 DIAGNOSIS — M25551 Pain in right hip: Secondary | ICD-10-CM | POA: Diagnosis not present

## 2022-05-30 DIAGNOSIS — I10 Essential (primary) hypertension: Secondary | ICD-10-CM | POA: Diagnosis not present

## 2022-05-30 DIAGNOSIS — M7918 Myalgia, other site: Secondary | ICD-10-CM | POA: Insufficient documentation

## 2022-05-30 MED ORDER — KETOROLAC TROMETHAMINE 30 MG/ML IJ SOLN
30.0000 mg | Freq: Once | INTRAMUSCULAR | Status: AC
  Administered 2022-05-30: 30 mg via INTRAVENOUS

## 2022-05-30 NOTE — Patient Instructions (Addendum)
     You received a pain medication shot today (toradol)     Return if symptoms worsen or fail to improve.

## 2022-05-30 NOTE — Assessment & Plan Note (Addendum)
Acute He fell last month and did go to the hospital and a CT showed insure muscular hematoma of the lateral gluteal muscle When he sat down last night I think he probably reinjured this-does have some bruising and is very sensitive in the area The bruising has not expanded so I do not think he is continuing to bleed-okay to continue Plavix Continue heat, okay to apply ointment He is requesting an injection today to help with the pain-will give Toradol 30 mg IM x 1 Hold off on imaging unless his pain is not improving or worsening Advised Tylenol Discussed I would like to avoid any stronger pain medication because of his frequent falls and risk of falls-he agrees

## 2022-05-30 NOTE — Assessment & Plan Note (Signed)
Chronic BP well controlled Continue coreg 6.25 mg bid, valsartan 80 mg daily  

## 2022-06-01 DIAGNOSIS — H35372 Puckering of macula, left eye: Secondary | ICD-10-CM | POA: Diagnosis not present

## 2022-06-05 ENCOUNTER — Encounter (HOSPITAL_BASED_OUTPATIENT_CLINIC_OR_DEPARTMENT_OTHER): Payer: Self-pay | Admitting: Family

## 2022-06-05 ENCOUNTER — Ambulatory Visit (HOSPITAL_BASED_OUTPATIENT_CLINIC_OR_DEPARTMENT_OTHER): Payer: Medicare Other | Admitting: Family

## 2022-06-05 VITALS — BP 146/88 | HR 97 | Ht 74.0 in | Wt 186.0 lb

## 2022-06-05 DIAGNOSIS — E785 Hyperlipidemia, unspecified: Secondary | ICD-10-CM | POA: Diagnosis not present

## 2022-06-05 DIAGNOSIS — I10 Essential (primary) hypertension: Secondary | ICD-10-CM | POA: Diagnosis not present

## 2022-06-05 DIAGNOSIS — I25118 Atherosclerotic heart disease of native coronary artery with other forms of angina pectoris: Secondary | ICD-10-CM | POA: Diagnosis not present

## 2022-06-05 DIAGNOSIS — I739 Peripheral vascular disease, unspecified: Secondary | ICD-10-CM | POA: Diagnosis not present

## 2022-06-05 NOTE — Progress Notes (Signed)
Office Visit    Patient Name: Ronald Stafford Date of Encounter: 06/05/2022  PCP:  Binnie Rail, MD   [place]  Cardiologist:  Buford Dresser, MD  Advanced Practice Provider:  No care team member to display Electrophysiologist:  None      Chief Complaint    Ronald Stafford is a 89 y.o. male presents today for follow-up after Leane Call  Past Medical History    Past Medical History:  Diagnosis Date   Cancer [facility]) 2004   prostate cancer, Dr. Risa Grill   Colon polyps    Deep venous thrombosis ([place]) 1997   Postop   Depression    GERD (gastroesophageal reflux disease)    Hyperlipidemia    Hypertension    Radial head fracture, closed 09/18/2013   Shoulder fracture, left 02/23/2011   immobilized in sling , Dr Norris(no surgery)   Past Surgical History:  Procedure Laterality Date   COLONOSCOPY  2006   Negative,Dr.Edwards   colonoscopy with polypectomy      PMH of   ESOPHAGOGASTRODUODENOSCOPY N/A 02/23/2017   Procedure: ESOPHAGOGASTRODUODENOSCOPY (EGD);  Surgeon: Otis Brace, MD;  Location: [facility];  Service: Gastroenterology;  Laterality: N/A;   JOINT REPLACEMENT  1997, 2001   Total Hip replacement X 2   LUMBAR SPINE SURGERY  09/28/2010   Dr Rolena Infante; for spinal stenosis   MECKEL DIVERTICULUM EXCISION     Age 78   PROSTATECTOMY  2004   w/ radiation, Dr Risa Grill   TOTAL HIP REVISION Left 02/14/2017   Procedure: Left hip polyethylene EXCHANGE;  Surgeon: Gaynelle Arabian, MD;  Location: [facility];  Service: Orthopedics;  Laterality: Left;   VASECTOMY      Allergies  Allergies  Allergen Reactions   Chicken Allergy Nausea And Vomiting and Other (See Comments)    Digestive problems    History of Present Illness    Ronald Stafford is a 89 y.o. male with a hx of hypertension, coronary artery calcification on CT, aortic atherosclerosis, carotid artery disease, PAD, HLD, TIA, history of DVT last seen 04/17/2022 by Venia Carbon,  NP.  Presented to the hospital 04/07/2022 following a fall the day prior with severe left-sided chest pain.  He had seen primary care and troponin elevated at 60 and advised to present to ED.  CT chest revealed severe aortic atherosclerosis and two-vessel coronary calcification.  Inadvertent finding of RLL 8 mm subpleural nodule recommended for repeat noncontrast chest CT in 6 to 12 months.  And again 18 to 24 months.  Echocardiogram LVEF 60 to 123456, grade 1 diastolic dysfunction, mild calcification of aortic valve.  He saw Venia Carbon, NP 04/17/2022.  He had 2 episodes of chest pain since discharge with lasting approximately 5 minutes which were substernal sharp in nature and resolved with time.  Subsequent Lexiscan Myoview 04/24/2022 was low risk with no evidence of ischemia.  ED visit 05/03/2022 after a fall while getting out of the hot tub landing on his right hip and thigh.  Noted to have intramuscular hematoma but no hip fracture or skeletal injury.  Recommended for rest, ice, heat, elevation, Tylenol/NSAID and follow-up with PCP.  He presents today for follow-up independently. Notes right hip is painful but improved since toradol injection with PCP. BP at home 120-130 most often. Enjoys spending time at his church, with girlfriend and his sons. Reports no shortness of breath nor dyspnea on exertion. Reports no chest pain, pressure, or tightness. No edema, orthopnea, PND. Reports no  palpitations.    EKGs/Labs/Other Studies Reviewed:   The following studies were reviewed today: Cardiac Studies & Procedures     STRESS TESTS  MYOCARDIAL PERFUSION IMAGING 04/24/2022  Narrative   The study is normal. The study is low risk.   No ST deviation was noted.   Left ventricular function is normal. Nuclear stress EF: 57 %. The left ventricular ejection fraction is normal (55-65%). End diastolic cavity size is normal.   Prior study not available for comparison.   ECHOCARDIOGRAM  ECHOCARDIOGRAM COMPLETE  04/07/2022  Narrative ECHOCARDIOGRAM REPORT    Patient Name:   Ronald Stafford Goth Date of Exam: 04/07/2022 Medical Rec #:  [id number]    Height:       74.0 in Accession #:    [id number]   Weight:       180.0 lb Date of Birth:  1930-06-14     BSA:          2.078 m Patient Age:    31 years     BP:           124/62 mmHg Patient Gender: M            HR:           74 bpm. Exam Location:  Inpatient  Procedure: 2D Echo, Cardiac Doppler and Color Doppler  Indications:    NSTEMI I21.4  History:        Patient has no prior history of Echocardiogram examinations. Previous Myocardial Infarction, TIA; Risk Factors:Hypertension and Dyslipidemia.  Sonographer:    Ronny Flurry Referring Phys: [phone] DEBBY CROSLEY   Sonographer Comments: Suboptimal parasternal window and suboptimal apical window. Image acquisition challenging due to respiratory motion. IMPRESSIONS   1. Overall poor quality images and no definity used. 2. Left ventricular ejection fraction, by estimation, is 60 to 65%. The left ventricle has normal function. The left ventricle has no regional wall motion abnormalities. Left ventricular diastolic parameters are consistent with Grade I diastolic dysfunction (impaired relaxation). 3. Right ventricular systolic function is normal. The right ventricular size is normal. 4. Likley shadowing artifact in RA in subcostal views May be from lipomatous hypertrophy Can consider chest CTA to further r/o mass in RA. 5. The mitral valve is normal in structure. No evidence of mitral valve regurgitation. No evidence of mitral stenosis. 6. The aortic valve was not well visualized. There is mild calcification of the aortic valve. There is mild thickening of the aortic valve. Aortic valve regurgitation is not visualized. Aortic valve sclerosis is present, with no evidence of aortic valve stenosis. 7. The inferior vena cava is normal in size with greater than 50% respiratory variability, suggesting right  atrial pressure of 3 mmHg.  FINDINGS Left Ventricle: Left ventricular ejection fraction, by estimation, is 60 to 65%. The left ventricle has normal function. The left ventricle has no regional wall motion abnormalities. The left ventricular internal cavity size was normal in size. There is no left ventricular hypertrophy. Left ventricular diastolic parameters are consistent with Grade I diastolic dysfunction (impaired relaxation).  Right Ventricle: The right ventricular size is normal. No increase in right ventricular wall thickness. Right ventricular systolic function is normal.  Left Atrium: Left atrial size was normal in size.  Right Atrium: Likley shadowing artifact in RA in subcostal views May be from lipomatous hypertrophy Can consider chest CTA to further r/o mass in RA. Right atrial size was normal in size.  Pericardium: There is no evidence of pericardial effusion.  Mitral Valve: The mitral  valve is normal in structure. No evidence of mitral valve regurgitation. No evidence of mitral valve stenosis.  Tricuspid Valve: The tricuspid valve is normal in structure. Tricuspid valve regurgitation is not demonstrated. No evidence of tricuspid stenosis.  Aortic Valve: The aortic valve was not well visualized. There is mild calcification of the aortic valve. There is mild thickening of the aortic valve. Aortic valve regurgitation is not visualized. Aortic valve sclerosis is present, with no evidence of aortic valve stenosis. Aortic valve mean gradient measures 3.0 mmHg. Aortic valve peak gradient measures 4.8 mmHg. Aortic valve area, by VTI measures 2.29 cm.  Pulmonic Valve: The pulmonic valve was normal in structure. Pulmonic valve regurgitation is not visualized. No evidence of pulmonic stenosis.  Aorta: The aortic root is normal in size and structure.  Venous: The inferior vena cava is normal in size with greater than 50% respiratory variability, suggesting right atrial pressure of 3  mmHg.  IAS/Shunts: No atrial level shunt detected by color flow Doppler.  Additional Comments: Overall poor quality images and no definity used.   LEFT VENTRICLE PLAX 2D LVIDd:         4.30 cm   Diastology LVIDs:         2.80 cm   LV e' medial:   5.98 cm/s LV PW:         0.90 cm   LV E/e' medial: 8.3 LV IVS:        0.80 cm LVOT diam:     2.10 cm LV SV:         52 LV SV Index:   25 LVOT Area:     3.46 cm   IVC IVC diam: 1.60 cm  LEFT ATRIUM         Index LA diam:    3.00 cm 1.44 cm/m AORTIC VALVE AV Area (Vmax):    2.22 cm AV Area (Vmean):   2.19 cm AV Area (VTI):     2.29 cm AV Vmax:           109.00 cm/s AV Vmean:          75.500 cm/s AV VTI:            0.228 m AV Peak Grad:      4.8 mmHg AV Mean Grad:      3.0 mmHg LVOT Vmax:         69.85 cm/s LVOT Vmean:        47.800 cm/s LVOT VTI:          0.150 m LVOT/AV VTI ratio: 0.66  AORTA Ao Root diam: 3.40 cm  MITRAL VALVE MV Area (PHT): 3.91 cm    SHUNTS MV Decel Time: 194 msec    Systemic VTI:  0.15 m MV E velocity: 49.70 cm/s  Systemic Diam: 2.10 cm MV A velocity: 72.00 cm/s MV E/A ratio:  0.69  Jenkins Rouge MD Electronically signed by Jenkins Rouge MD Signature Date/Time: 04/07/2022/2:08:42 PM    Final              EKG:  EKG is not ordered today.  Recent Labs: 04/06/2022: ALT 27 04/10/2022: BUN 23; Creatinine, Ser 0.94; Hemoglobin 12.7; Magnesium 2.1; Platelets 161.0; Potassium 4.5; Sodium 146  Recent Lipid Panel    Component Value Date/Time   CHOL 102 04/07/2022 0517   TRIG 55 04/07/2022 0517   HDL 37 (L) 04/07/2022 0517   CHOLHDL 2.8 04/07/2022 0517   VLDL 11 04/07/2022 0517   LDLCALC 54 04/07/2022 0517  Home Medications   Current Meds  Medication Sig   acetaminophen (TYLENOL) 500 MG tablet Take 1,000 mg by mouth every 6 (six) hours as needed for mild pain.   atorvastatin (LIPITOR) 40 MG tablet Take 1 tablet (40 mg total) by mouth daily.   carvedilol (COREG) 6.25 MG tablet Take 1  tablet (6.25 mg total) by mouth 2 (two) times daily with a meal.   clopidogrel (PLAVIX) 75 MG tablet Take 1 tablet (75 mg total) by mouth every morning.   ipratropium-albuterol (DUONEB) 0.5-2.5 (3) MG/3ML SOLN Take 3 mLs by nebulization every 6 (six) hours as needed (shortness of breath).   meclizine (ANTIVERT) 12.5 MG tablet Take 1 tablet (12.5 mg total) by mouth 3 (three) times daily as needed for dizziness.   mirabegron ER (MYRBETRIQ) 25 MG TB24 tablet Take 1 tablet (25 mg total) by mouth daily as needed. (Patient taking differently: Take 25 mg by mouth daily as needed (bladder control).)   Omega-3 Fatty Acids (FISH OIL) 500 MG CAPS Take 500 mg by mouth daily.   valsartan (DIOVAN) 80 MG tablet Take 1 tablet (80 mg total) by mouth daily.   vitamin C (ASCORBIC ACID) 250 MG tablet Take 250 mg by mouth daily.     Review of Systems      All other systems reviewed and are otherwise negative except as noted above.  Physical Exam    VS:  BP (!) 146/88   Pulse 97   Ht 6\' 2"  (1.88 m)   Wt 186 lb (84.4 kg)   BMI 23.88 kg/m  , BMI Body mass index is 23.88 kg/m.  Wt Readings from Last 3 Encounters:  06/05/22 186 lb (84.4 kg)  05/30/22 186 lb (84.4 kg)  05/09/22 184 lb (83.5 kg)     GEN: Well nourished, well developed, in no acute distress. HEENT: normal. Neck: Supple, no JVD, carotid bruits, or masses. Cardiac: RRR, no murmurs, rubs, or gallops. No clubbing, cyanosis, edema.  Radials/PT 2+ and equal bilaterally.  Respiratory:  Respirations regular and unlabored, clear to auscultation bilaterally. GI: Soft, nontender, nondistended. MS: No deformity or atrophy. Skin: Warm and dry, no rash. Neuro:  Strength and sensation are intact. Psych: Normal affect.  Assessment & Plan    Coronary calcification on CT/hyperlipidemia/aortic atherosclerosis-prior CT with two-vessel coronary calcification.  Myoview 04/2018 for low risk study with no ischemia.  Continue GDMT Plavix, aspirin, atorvastatin.  Heart healthy diet and regular cardiovascular exercise encouraged.    Hypertension- BP elevated in clinic however recent clinic visits with PCP 124/76 and 130/72. BP at home 120s-130s. Hesitant to over-treat given hx of falls. Continue Coreg 6.25mg  BID, Valsartan 80mg  QD. Discussed to monitor BP at home at least 2 hours after medications and sitting for 5-10 minutes. He will contact us if consistently >130/80.  PAD-continue Plavix per [facility]  HYPERTENSION CONTROL Vitals:   06/05/22 1327 06/05/22 1342  BP: (!) 156/90 (!) 146/88    The patient's blood pressure is elevated above target today.  In order to address the patient's elevated BP: Blood pressure will be monitored at home to determine if medication changes need to be made.         Disposition: Follow up  in 6-9 months  with Buford Dresser, MD or APP.  Signed, Loel Dubonnet, NP 06/05/2022, 2:00 PM [place]

## 2022-06-05 NOTE — Patient Instructions (Addendum)
Medication Instructions:  Continue your current medications.   *If you need a refill on your cardiac medications before your next appointment, please call your pharmacy*  Lab Work/Testing/Procedures: Your stress test was low risk and showed no evidence of reduced blood flow which is good!  Follow-Up: At [facility], you and your health needs are our priority.  As part of our continuing mission to provide you with exceptional heart care, we have created designated Provider Care Teams.  These Care Teams include your primary Cardiologist (physician) and Advanced Practice Providers (APPs -  Physician Assistants and Nurse Practitioners) who all work together to provide you with the care you need, when you need it.  We recommend signing up for the patient portal called "MyChart".  Sign up information is provided on this After Visit Summary.  MyChart is used to connect with patients for Virtual Visits (Telemedicine).  Patients are able to view lab/test results, encounter notes, upcoming appointments, etc.  Non-urgent messages can be sent to your provider as well.   To learn more about what you can do with MyChart, go to [email or web address].    Your next appointment:   6-9 month(s)  Provider:   Buford Dresser, MD or Laurann Montana, NP   Other Instructions Tips to Measure your Blood Pressure Correctly  Call our office if your blood pressure is consistently more than 130/80 at home.  Here's what you can do to ensure a correct reading:  Don't drink a caffeinated beverage or smoke during the 30 minutes before the test.  Sit quietly for five minutes before the test begins.  During the measurement, sit in a chair with your feet on the floor and your arm supported so your elbow is at about heart level.  The inflatable part of the cuff should completely cover at least 80% of your upper arm, and the cuff should be placed on bare skin, not over a shirt.  Don't talk during the  measurement.  Have your blood pressure measured twice, with a brief break in between. If the readings are different by 5 points or more, have it done a third time.   Blood pressure categories  Blood pressure category SYSTOLIC (upper number)  DIASTOLIC (lower number)  Normal Less than 120 mm Hg and Less than 80 mm Hg  Elevated 120-129 mm Hg and Less than 80 mm Hg  High blood pressure: Stage 1 hypertension 130-139 mm Hg or 80-89 mm Hg  High blood pressure: Stage 2 hypertension 140 mm Hg or higher or 90 mm Hg or higher  Hypertensive crisis (consult your doctor immediately) Higher than 180 mm Hg and/or Higher than 120 mm Hg  Source: American Heart Association and American Stroke Association. For more on getting your blood pressure under control, buy Controlling Your Blood Pressure, a Special Health Report from [facility].   Blood Pressure Log   Date   Time  Blood Pressure  Example: Nov 1 9 AM 124/78

## 2022-07-11 ENCOUNTER — Other Ambulatory Visit: Payer: Self-pay | Admitting: Internal Medicine

## 2022-07-14 DIAGNOSIS — I10 Essential (primary) hypertension: Secondary | ICD-10-CM | POA: Diagnosis not present

## 2022-07-14 DIAGNOSIS — H2513 Age-related nuclear cataract, bilateral: Secondary | ICD-10-CM | POA: Diagnosis not present

## 2022-07-14 DIAGNOSIS — H2512 Age-related nuclear cataract, left eye: Secondary | ICD-10-CM | POA: Diagnosis not present

## 2022-08-28 ENCOUNTER — Ambulatory Visit (INDEPENDENT_AMBULATORY_CARE_PROVIDER_SITE_OTHER): Payer: Medicare Other

## 2022-08-28 VITALS — Ht 74.0 in | Wt 180.0 lb

## 2022-08-28 DIAGNOSIS — Z0000 Encounter for general adult medical examination without abnormal findings: Secondary | ICD-10-CM

## 2022-08-28 NOTE — Patient Instructions (Addendum)
Ronald Stafford , Thank you for taking time to come for your Medicare Wellness Visit. I appreciate your ongoing commitment to your health goals. Please review the following plan we discussed and let me know if I can assist you in the future.   These are the goals we discussed:  Goals      My goal for 2024 is to maintain my health, enjoy life and stay happy.        This is a list of the screening recommended for you and due dates:  Health Maintenance  Topic Date Due   Zoster (Shingles) Vaccine (1 of 2) Never done   COVID-19 Vaccine (5 - 2023-24 season) 11/11/2021   Flu Shot  10/12/2022   Medicare Annual Wellness Visit  08/28/2023   DTaP/Tdap/Td vaccine (3 - Td or Tdap) 04/04/2026   Pneumonia Vaccine  Completed   HPV Vaccine  Aged Out   Immunization History  Administered Date(s) Administered   Fluad Quad(high Dose 65+) 10/31/2018, 12/24/2019   Influenza Split 12/20/2010, 12/13/2011   Influenza Whole 12/06/2007, 01/05/2009, 12/06/2009   Influenza, High Dose Seasonal PF 12/18/2013, 12/07/2014, 12/17/2015, 12/14/2017, 12/15/2021   Influenza,inj,Quad PF,6+ Mos 01/01/2013   Influenza,inj,quad, With Preservative 12/11/2016   Influenza-Unspecified 11/27/2016, 11/18/2020   PFIZER(Purple Top)SARS-COV-2 Vaccination 04/03/2019, 04/24/2019, 12/26/2019, 07/23/2020   Pneumococcal Conjugate-13 07/14/2014   Pneumococcal Polysaccharide-23 02/28/2013   Respiratory Syncytial Virus Vaccine,Recomb Aduvanted(Arexvy) 12/13/2021   Tdap 06/06/2005, 04/04/2016   Zoster, Live 03/01/2011   Advanced directives: Yes; Please bring a copy of your health care power of attorney and living will to the office at your convenience.  Conditions/risks identified: Yes  Next appointment:  09/03/2023 at 2:00 p.m. telephone visit with Percell Miller, Nurse Health Advisor.  If you need to reschedule or cancel, please call [phone].  Preventive Care 85 Years and Older, Male  Preventive care refers to lifestyle choices and visits  with your health care provider that can promote health and wellness. What does preventive care include? A yearly physical exam. This is also called an annual well check. Dental exams once or twice a year. Routine eye exams. Ask your health care provider how often you should have your eyes checked. Personal lifestyle choices, including: Daily care of your teeth and gums. Regular physical activity. Eating a healthy diet. Avoiding tobacco and drug use. Limiting alcohol use. Practicing safe sex. Taking low doses of aspirin every day. Taking vitamin and mineral supplements as recommended by your health care provider. What happens during an annual well check? The services and screenings done by your health care provider during your annual well check will depend on your age, overall health, lifestyle risk factors, and family history of disease. Counseling  Your health care provider may ask you questions about your: Alcohol use. Tobacco use. Drug use. Emotional well-being. Home and relationship well-being. Sexual activity. Eating habits. History of falls. Memory and ability to understand (cognition). Work and work Astronomer. Screening  You may have the following tests or measurements: Height, weight, and BMI. Blood pressure. Lipid and cholesterol levels. These may be checked every 5 years, or more frequently if you are over 33 years old. Skin check. Lung cancer screening. You may have this screening every year starting at age 41 if you have a 30-pack-year history of smoking and currently smoke or have quit within the past 15 years. Fecal occult blood test (FOBT) of the stool. You may have this test every year starting at age 23. Flexible sigmoidoscopy or colonoscopy. You may have a sigmoidoscopy  every 5 years or a colonoscopy every 10 years starting at age 35. Prostate cancer screening. Recommendations will vary depending on your family history and other risks. Hepatitis C blood  test. Hepatitis B blood test. Sexually transmitted disease (STD) testing. Diabetes screening. This is done by checking your blood sugar (glucose) after you have not eaten for a while (fasting). You may have this done every 1-3 years. Abdominal aortic aneurysm (AAA) screening. You may need this if you are a current or former smoker. Osteoporosis. You may be screened starting at age 38 if you are at high risk. Talk with your health care provider about your test results, treatment options, and if necessary, the need for more tests. Vaccines  Your health care provider may recommend certain vaccines, such as: Influenza vaccine. This is recommended every year. Tetanus, diphtheria, and acellular pertussis (Tdap, Td) vaccine. You may need a Td booster every 10 years. Zoster vaccine. You may need this after age 48. Pneumococcal 13-valent conjugate (PCV13) vaccine. One dose is recommended after age 58. Pneumococcal polysaccharide (PPSV23) vaccine. One dose is recommended after age 59. Talk to your health care provider about which screenings and vaccines you need and how often you need them. This information is not intended to replace advice given to you by your health care provider. Make sure you discuss any questions you have with your health care provider. Document Released: 03/26/2015 Document Revised: 11/17/2015 Document Reviewed: 12/29/2014 Elsevier Interactive Patient Education  2017 [employer].  Fall Prevention in the Home Falls can cause injuries. They can happen to people of all ages. There are many things you can do to make your home safe and to help prevent falls. What can I do on the outside of my home? Regularly fix the edges of walkways and driveways and fix any cracks. Remove anything that might make you trip as you walk through a door, such as a raised step or threshold. Trim any bushes or trees on the path to your home. Use bright outdoor lighting. Clear any walking paths of  anything that might make someone trip, such as rocks or tools. Regularly check to see if handrails are loose or broken. Make sure that both sides of any steps have handrails. Any raised decks and porches should have guardrails on the edges. Have any leaves, snow, or ice cleared regularly. Use sand or salt on walking paths during winter. Clean up any spills in your garage right away. This includes oil or grease spills. What can I do in the bathroom? Use night lights. Install grab bars by the toilet and in the tub and shower. Do not use towel bars as grab bars. Use non-skid mats or decals in the tub or shower. If you need to sit down in the shower, use a plastic, non-slip stool. Keep the floor dry. Clean up any water that spills on the floor as soon as it happens. Remove soap buildup in the tub or shower regularly. Attach bath mats securely with double-sided non-slip rug tape. Do not have throw rugs and other things on the floor that can make you trip. What can I do in the bedroom? Use night lights. Make sure that you have a light by your bed that is easy to reach. Do not use any sheets or blankets that are too big for your bed. They should not hang down onto the floor. Have a firm chair that has side arms. You can use this for support while you get dressed. Do not have  throw rugs and other things on the floor that can make you trip. What can I do in the kitchen? Clean up any spills right away. Avoid walking on wet floors. Keep items that you use a lot in easy-to-reach places. If you need to reach something above you, use a strong step stool that has a grab bar. Keep electrical cords out of the way. Do not use floor polish or wax that makes floors slippery. If you must use wax, use non-skid floor wax. Do not have throw rugs and other things on the floor that can make you trip. What can I do with my stairs? Do not leave any items on the stairs. Make sure that there are handrails on both  sides of the stairs and use them. Fix handrails that are broken or loose. Make sure that handrails are as long as the stairways. Check any carpeting to make sure that it is firmly attached to the stairs. Fix any carpet that is loose or worn. Avoid having throw rugs at the top or bottom of the stairs. If you do have throw rugs, attach them to the floor with carpet tape. Make sure that you have a light switch at the top of the stairs and the bottom of the stairs. If you do not have them, ask someone to add them for you. What else can I do to help prevent falls? Wear shoes that: Do not have high heels. Have rubber bottoms. Are comfortable and fit you well. Are closed at the toe. Do not wear sandals. If you use a stepladder: Make sure that it is fully opened. Do not climb a closed stepladder. Make sure that both sides of the stepladder are locked into place. Ask someone to hold it for you, if possible. Clearly mark and make sure that you can see: Any grab bars or handrails. First and last steps. Where the edge of each step is. Use tools that help you move around (mobility aids) if they are needed. These include: Canes. Walkers. Scooters. Crutches. Turn on the lights when you go into a dark area. Replace any light bulbs as soon as they burn out. Set up your furniture so you have a clear path. Avoid moving your furniture around. If any of your floors are uneven, fix them. If there are any pets around you, be aware of where they are. Review your medicines with your doctor. Some medicines can make you feel dizzy. This can increase your chance of falling. Ask your doctor what other things that you can do to help prevent falls. This information is not intended to replace advice given to you by your health care provider. Make sure you discuss any questions you have with your health care provider. Document Released: 12/24/2008 Document Revised: 08/05/2015 Document Reviewed: 04/03/2014 Elsevier  Interactive Patient Education  2017 [employer].

## 2022-08-28 NOTE — Progress Notes (Signed)
I connected with  Ronald Stafford on 08/28/22 by a audio enabled telemedicine application and verified that I am speaking with the correct person using two identifiers.  Patient Location: Home  Provider Location: Office/Clinic  I discussed the limitations of evaluation and management by telemedicine. The patient expressed understanding and agreed to proceed.  Subjective:   Ronald Stafford is a 89 y.o. male who presents for Medicare Annual/Subsequent preventive examination.  Review of Systems     Cardiac Risk Factors include: advanced age (>47men, >34 women);dyslipidemia;hypertension;male gender     Objective:    Today's Vitals   08/28/22 1447 08/28/22 1448  Weight: 180 lb (81.6 kg)   Height: 6\' 2"  (1.88 m)   PainSc: 7  7   PainLoc: Back    Body mass index is 23.11 kg/m.     08/28/2022    2:50 PM 05/03/2022    2:06 PM 04/06/2022    9:55 PM 08/26/2021   11:21 AM 08/17/2020   11:24 AM 03/25/2019    8:22 AM 04/18/2018    9:23 AM  Advanced Directives  Does Patient Have a Medical Advance Directive? Yes Yes No Yes Yes No Yes  Type of Estate agent of [place];Living will Living will  Healthcare Power of [place];Living will Living will  Healthcare Power of [place];Living will  Does patient want to make changes to medical advance directive?     No - Patient declined    Copy of Healthcare Power of Attorney in Chart? No - copy requested   No - copy requested   No - copy requested  Would patient like information on creating a medical advance directive?   No - Patient declined   No - Patient declined     Current Medications (verified) Outpatient Encounter Medications as of 08/28/2022  Medication Sig   acetaminophen (TYLENOL) 500 MG tablet Take 1,000 mg by mouth every 6 (six) hours as needed for mild pain.   atorvastatin (LIPITOR) 40 MG tablet Take 1 tablet (40 mg total) by mouth daily.   carvedilol (COREG) 6.25 MG tablet Take 1 tablet (6.25 mg total) by mouth 2 (two)  times daily with a meal.   clopidogrel (PLAVIX) 75 MG tablet Take 1 tablet (75 mg total) by mouth every morning.   ipratropium-albuterol (DUONEB) 0.5-2.5 (3) MG/3ML SOLN Take 3 mLs by nebulization every 6 (six) hours as needed (shortness of breath).   meclizine (ANTIVERT) 12.5 MG tablet Take 1 tablet (12.5 mg total) by mouth 3 (three) times daily as needed for dizziness.   MYRBETRIQ 25 MG TB24 tablet TAKE 1 TABLET BY MOUTH ONCE DAILY AS NEEDED   Omega-3 Fatty Acids (FISH OIL) 500 MG CAPS Take 500 mg by mouth daily.   valsartan (DIOVAN) 80 MG tablet Take 1 tablet (80 mg total) by mouth daily.   vitamin C (ASCORBIC ACID) 250 MG tablet Take 250 mg by mouth daily.   No facility-administered encounter medications on file as of 08/28/2022.    Allergies (verified) Chicken allergy   History: Past Medical History:  Diagnosis Date   Cancer (HCC) 2004   prostate cancer, Dr. Isabel Caprice   Colon polyps    Deep venous thrombosis (HCC) 1997   Postop   Depression    GERD (gastroesophageal reflux disease)    Hyperlipidemia    Hypertension    Radial head fracture, closed 09/18/2013   Shoulder fracture, left 02/23/2011   immobilized in sling , Dr Norris(no surgery)   Past Surgical History:  Procedure Laterality  Date   COLONOSCOPY  2006   Negative,Dr.Edwards   colonoscopy with polypectomy      PMH of   ESOPHAGOGASTRODUODENOSCOPY N/A 02/23/2017   Procedure: ESOPHAGOGASTRODUODENOSCOPY (EGD);  Surgeon: Kathi Der, MD;  Location: [facility];  Service: Gastroenterology;  Laterality: N/A;   JOINT REPLACEMENT  1997, 2001   Total Hip replacement X 2   LUMBAR SPINE SURGERY  09/28/2010   Dr Shon Baton; for spinal stenosis   MECKEL DIVERTICULUM EXCISION     Age 89   PROSTATECTOMY  2004   w/ radiation, Dr Isabel Caprice   TOTAL HIP REVISION Left 02/14/2017   Procedure: Left hip polyethylene EXCHANGE;  Surgeon: Ollen Gross, MD;  Location: [facility];  Service: Orthopedics;  Laterality: Left;   VASECTOMY      Family History  Problem Relation Age of Onset   Prostate cancer Father    Testicular cancer Son    Social History   Socioeconomic History   Marital status: Widowed    Spouse name: Not on file   Number of children: 2   Years of education: Not on file   Highest education level: Not on file  Occupational History   Occupation: retired  Tobacco Use   Smoking status: Former    Packs/day: 0.50    Years: 5.00    Additional pack years: 0.00    Total pack years: 2.50    Types: Cigarettes    Quit date: 03/13/1988    Years since quitting: 34.4   Smokeless tobacco: Former   Tobacco comments:    Quit at about age 33 / chewed tobacca x 1 year  Vaping Use   Vaping Use: Never used  Substance and Sexual Activity   Alcohol use: No    Alcohol/week: 0.0 standard drinks of alcohol   Drug use: No   Sexual activity: Never  Other Topics Concern   Not on file  Social History Narrative   Lives alone; Wife passed 05-Dec-2012;   Died of ovarian cancer; breast cancer; other cancer;      Social Determinants of Health   Financial Resource Strain: Low Risk  (08/28/2022)   Overall Financial Resource Strain (CARDIA)    Difficulty of Paying Living Expenses: Not hard at all  Food Insecurity: No Food Insecurity (08/28/2022)   Hunger Vital Sign    Worried About Running Out of Food in the Last Year: Never true    Ran Out of Food in the Last Year: Never true  Transportation Needs: No Transportation Needs (08/28/2022)   PRAPARE - Administrator, Civil Service (Medical): No    Lack of Transportation (Non-Medical): No  Physical Activity: Sufficiently Active (08/28/2022)   Exercise Vital Sign    Days of Exercise per Week: 5 days    Minutes of Exercise per Session: 30 min  Stress: No Stress Concern Present (08/28/2022)   [employer] of Occupational Health - Occupational Stress Questionnaire    Feeling of Stress : Not at all  Social Connections: Moderately Integrated (08/28/2022)    Social Connection and Isolation Panel [NHANES]    Frequency of Communication with Friends and Family: More than three times a week    Frequency of Social Gatherings with Friends and Family: More than three times a week    Attends Religious Services: More than 4 times per year    Active Member of [employer] or Organizations: Yes    Attends Banker Meetings: More than 4 times per year    Marital Status: Widowed  Recent Concern: Social Connections - Moderately Isolated (08/28/2022)   Social Connection and Isolation Panel [NHANES]    Frequency of Communication with Friends and Family: More than three times a week    Frequency of Social Gatherings with Friends and Family: Three times a week    Attends Religious Services: More than 4 times per year    Active Member of Clubs or Organizations: No    Attends Banker Meetings: Never    Marital Status: Widowed    Tobacco Counseling Counseling given: Not Answered Tobacco comments: Quit at about age 89 / chewed tobacca x 1 year   Clinical Intake:  Pre-visit preparation completed: Yes  Pain : 0-10 Pain Score: 7  Pain Type: Chronic pain Pain Location: Back Pain Orientation: Lower     BMI - recorded: 23.11 Nutritional Risks: None Diabetes: No  How often do you need to have someone help you when you read instructions, pamphlets, or other written materials from your doctor or pharmacy?: 1 - Never What is the last grade level you completed in school?: HSG  Diabetic? No  Interpreter Needed?: No  Information entered by :: Susie Cassette, LPN.   Activities of Daily Living    08/28/2022    2:56 PM  In your present state of health, do you have any difficulty performing the following activities:  Hearing? 0  Vision? 0  Difficulty concentrating or making decisions? 0  Walking or climbing stairs? 0  Dressing or bathing? 0  Doing errands, shopping? 0  Preparing Food and eating ? N  Using the Toilet? N  In the  past six months, have you accidently leaked urine? N  Do you have problems with loss of bowel control? N  Managing your Medications? N  Managing your Finances? N  Housekeeping or managing your Housekeeping? N    Patient Care Team: Pincus Sanes, MD as PCP - General (Internal Medicine) Jodelle Red, MD as PCP - Cardiology (Cardiology) Barron Alvine, MD (Inactive) as Consulting Physician (Urology) Eldred Manges, MD as Consulting Physician (Orthopedic Surgery) Kathyrn Sheriff, [facility] as Pharmacist (Pharmacist) Early, Kristen Loader, MD as Consulting Physician (Vascular Surgery)  Indicate any recent Medical Services you may have received from other than [facility] providers in the past year (date may be approximate).     Assessment:   This is a routine wellness examination for [place].  Hearing/Vision screen Hearing Screening - Comments:: Denies hearing difficulties.   Vision Screening - Comments:: Wears rx glasses - up to date with routine eye exams with    Dietary issues and exercise activities discussed: Current Exercise Habits: Home exercise routine, Type of exercise: walking;treadmill;strength training/weights;Other - see comments (working in the yard), Time (Minutes): 30, Frequency (Times/Week): 5, Weekly Exercise (Minutes/Week): 150, Intensity: Moderate, Exercise limited by: orthopedic condition(s);Other - see comments (hx of falls)   Goals Addressed             This Visit's Progress    My goal for 2024 is to maintain my health, enjoy life and stay happy.        Depression Screen    08/28/2022    2:55 PM 05/30/2022    2:16 PM 05/09/2022   10:05 AM 04/13/2022    1:39 PM 04/10/2022   10:00 AM 04/06/2022   11:34 AM 01/11/2022    9:42 AM  PHQ 2/9 Scores  PHQ - 2 Score 0 0 0 0 0 0 0  PHQ- 9 Score 0 0 0  0 0  Fall Risk    08/28/2022    2:59 PM 05/09/2022   10:05 AM 04/10/2022    9:59 AM 04/06/2022   11:34 AM 01/11/2022    9:42 AM  Fall Risk   Falls in the past year? 1  1 1 1  0  Number falls in past yr: 1 0 0 0 0  Injury with Fall? 1 1 0 0 0  Risk for fall due to : History of fall(s);Impaired balance/gait;Orthopedic patient Impaired mobility No Fall Risks History of fall(s) No Fall Risks  Risk for fall due to: Comment    cane   Follow up Education provided;Falls prevention discussed Falls evaluation completed Falls evaluation completed  Falls evaluation completed    FALL RISK PREVENTION PERTAINING TO THE HOME:  Any stairs in or around the home? Yes  If so, are there any without handrails? No  Home free of loose throw rugs in walkways, pet beds, electrical cords, etc? Yes  Adequate lighting in your home to reduce risk of falls? Yes   ASSISTIVE DEVICES UTILIZED TO PREVENT FALLS:  Life alert? No  Use of a cane, walker or w/c? Yes  Grab bars in the bathroom? Yes  Shower chair or bench in shower? Yes  Elevated toilet seat or a handicapped toilet? Yes   TIMED UP AND GO:  Was the test performed? No . Telephonic Visit   Cognitive Function:    07/14/2014   10:13 AM  MMSE - Mini Mental State Exam  Not completed: Unable to complete        08/28/2022    2:55 PM 06/18/2019    8:33 AM  6CIT Screen  What Year? 0 points 0 points  What month? 0 points 0 points  What time? 0 points 0 points  Count back from 20 0 points 2 points  Months in reverse 0 points 2 points  Repeat phrase 0 points 0 points  Total Score 0 points 4 points    Immunizations Immunization History  Administered Date(s) Administered   Fluad Quad(high Dose 65+) 10/31/2018, 12/24/2019   Influenza Split 12/20/2010, 12/13/2011   Influenza Whole 12/06/2007, 01/05/2009, 12/06/2009   Influenza, High Dose Seasonal PF 12/18/2013, 12/07/2014, 12/17/2015, 12/14/2017, 12/15/2021   Influenza,inj,Quad PF,6+ Mos 01/01/2013   Influenza,inj,quad, With Preservative 12/11/2016   Influenza-Unspecified 11/27/2016, 11/18/2020   PFIZER(Purple Top)SARS-COV-2 Vaccination 04/03/2019, 04/24/2019,  12/26/2019, 07/23/2020   Pneumococcal Conjugate-13 07/14/2014   Pneumococcal Polysaccharide-23 02/28/2013   Respiratory Syncytial Virus Vaccine,Recomb Aduvanted(Arexvy) 12/13/2021   Tdap 06/06/2005, 04/04/2016   Zoster, Live 03/01/2011    TDAP status: Up to date  Flu Vaccine status: Up to date  Pneumococcal vaccine status: Up to date  Covid-19 vaccine status: Completed vaccines  Qualifies for Shingles Vaccine? Yes   Zostavax completed Yes   Shingrix Completed?: No.    Education has been provided regarding the importance of this vaccine. Patient has been advised to call insurance company to determine out of pocket expense if they have not yet received this vaccine. Advised may also receive vaccine at local pharmacy or Health Dept. Verbalized acceptance and understanding.  Screening Tests Health Maintenance  Topic Date Due   Zoster Vaccines- Shingrix (1 of 2) Never done   COVID-19 Vaccine (5 - 2023-24 season) 11/11/2021   INFLUENZA VACCINE  10/12/2022   Medicare Annual Wellness (AWV)  08/28/2023   DTaP/Tdap/Td (3 - Td or Tdap) 04/04/2026   Pneumonia Vaccine 58+ Years old  Completed   HPV VACCINES  Aged Out    Health Maintenance  Health Maintenance Due  Topic Date Due   Zoster Vaccines- Shingrix (1 of 2) Never done   COVID-19 Vaccine (5 - 2023-24 season) 11/11/2021    Colorectal cancer screening: No longer required.   Lung Cancer Screening: (Low Dose CT Chest recommended if Age 53-80 years, 30 pack-year currently smoking OR have quit w/in 15years.) does not qualify.   Lung Cancer Screening Referral: no  Additional Screening:  Hepatitis C Screening: does not qualify; Completed: no  Vision Screening: Recommended annual ophthalmology exams for early detection of glaucoma and other disorders of the eye. Is the patient up to date with their annual eye exam?  Yes  Who is the provider or what is the name of the office in which the patient attends annual eye exams? Patient  could not remember the name.  Patient will be having cataracts removed 09/11/2022 and 10/12/2022. If pt is not established with a provider, would they like to be referred to a provider to establish care? No .   Dental Screening: Recommended annual dental exams for proper oral hygiene  Community Resource Referral / Chronic Care Management: CRR required this visit?  No   CCM required this visit?  No      Plan:     I have personally reviewed and noted the following in the patient's chart:   Medical and social history Use of alcohol, tobacco or illicit drugs  Current medications and supplements including opioid prescriptions. Patient is not currently taking opioid prescriptions. Functional ability and status Nutritional status Physical activity Advanced directives List of other physicians Hospitalizations, surgeries, and ER visits in previous 12 months Vitals Screenings to include cognitive, depression, and falls Referrals and appointments  In addition, I have reviewed and discussed with patient certain preventive protocols, quality metrics, and best practice recommendations. A written personalized care plan for preventive services as well as general preventive health recommendations were provided to patient.     Mickeal Needy, LPN   1/61/0960   Nurse Notes: Normal cognitive status assessed by direct observation via telephone conversation by this Nurse Health Advisor. No abnormalities found.

## 2022-09-11 DIAGNOSIS — H2512 Age-related nuclear cataract, left eye: Secondary | ICD-10-CM | POA: Diagnosis not present

## 2022-09-11 DIAGNOSIS — H2511 Age-related nuclear cataract, right eye: Secondary | ICD-10-CM | POA: Diagnosis not present

## 2022-10-08 ENCOUNTER — Encounter: Payer: Self-pay | Admitting: Internal Medicine

## 2022-10-08 NOTE — Patient Instructions (Addendum)
      Blood work was ordered.   The lab is on the first floor.    Medications changes include :   none     Return in about 6 months (around 04/11/2023) for Physical Exam.

## 2022-10-08 NOTE — Progress Notes (Signed)
Subjective:    Patient ID: Ronald Stafford, male    DOB: 1930/12/25, 89 y.o.   MRN: [id number]     HPI Ronald Stafford is here for follow up of his chronic medical problems.    Medications and allergies reviewed with patient and updated if appropriate.  Current Outpatient Medications on File Prior to Visit  Medication Sig Dispense Refill  . acetaminophen (TYLENOL) 500 MG tablet Take 1,000 mg by mouth every 6 (six) hours as needed for mild pain.    [name] atorvastatin (LIPITOR) 40 MG tablet Take 1 tablet (40 mg total) by mouth daily. 90 tablet 2  . carvedilol (COREG) 6.25 MG tablet Take 1 tablet (6.25 mg total) by mouth 2 (two) times daily with a meal. 60 tablet 2  . clopidogrel (PLAVIX) 75 MG tablet Take 1 tablet (75 mg total) by mouth every morning. 90 tablet 5  . ipratropium-albuterol (DUONEB) 0.5-2.5 (3) MG/3ML SOLN Take 3 mLs by nebulization every 6 (six) hours as needed (shortness of breath).    . MYRBETRIQ 25 MG TB24 tablet TAKE 1 TABLET BY MOUTH ONCE DAILY AS NEEDED 90 tablet 0  . Omega-3 Fatty Acids (FISH OIL) 500 MG CAPS Take 500 mg by mouth daily.    . valsartan (DIOVAN) 80 MG tablet Take 1 tablet (80 mg total) by mouth daily. 90 tablet 3   No current facility-administered medications on file prior to visit.     Review of Systems     Objective:  There were no vitals filed for this visit. BP Readings from Last 3 Encounters:  12/04/22 (!) 120/50  11/17/22 (!) 180/80  06/05/22 (!) 146/88   Wt Readings from Last 3 Encounters:  12/04/22 177 lb (80.3 kg)  11/17/22 179 lb (81.2 kg)  08/28/22 180 lb (81.6 kg)   There is no height or weight on file to calculate BMI.    Physical Exam     Lab Results  Component Value Date   WBC 5.9 04/10/2022   HGB 12.7 (L) 04/10/2022   HCT 38.9 (L) 04/10/2022   PLT 161.0 04/10/2022   GLUCOSE 107 (H) 04/10/2022   CHOL 102 04/07/2022   TRIG 55 04/07/2022   HDL 37 (L) 04/07/2022   LDLCALC 54 04/07/2022   ALT 27 04/06/2022   AST 27  04/06/2022   NA 146 (H) 04/10/2022   K 4.5 04/10/2022   CL 109 04/10/2022   CREATININE 0.94 04/10/2022   BUN 23 04/10/2022   CO2 30 04/10/2022   TSH 3.23 10/31/2018   PSA 0.00 (L) 10/31/2018   INR 1.1 04/07/2022   HGBA1C 5.9 06/02/2021     Assessment & Plan:    See Problem List for Assessment and Plan of chronic medical problems.    This encounter was created in error - please disregard.

## 2022-10-09 ENCOUNTER — Encounter: Payer: Medicare Other | Admitting: Internal Medicine

## 2022-10-09 DIAGNOSIS — E7849 Other hyperlipidemia: Secondary | ICD-10-CM

## 2022-10-09 DIAGNOSIS — I739 Peripheral vascular disease, unspecified: Secondary | ICD-10-CM

## 2022-10-09 DIAGNOSIS — R7303 Prediabetes: Secondary | ICD-10-CM

## 2022-10-09 DIAGNOSIS — I10 Essential (primary) hypertension: Secondary | ICD-10-CM

## 2022-10-09 DIAGNOSIS — Z8673 Personal history of transient ischemic attack (TIA), and cerebral infarction without residual deficits: Secondary | ICD-10-CM

## 2022-10-09 NOTE — Assessment & Plan Note (Signed)
Chronic BP well controlled CMP, CBC Continue coreg 6.25 mg bid, valsartan 80 mg daily

## 2022-10-09 NOTE — Assessment & Plan Note (Signed)
Chronic Check lipid panel  Continue atorvastatin 40 mg daily Regular exercise and healthy diet encouraged  

## 2022-10-09 NOTE — Assessment & Plan Note (Signed)
Chronic Check a1c Low sugar / carb diet Stressed regular exercise  

## 2022-10-09 NOTE — Assessment & Plan Note (Signed)
Chronic Continue Plavix 75 mg daily, atorvastatin 40 mg daily

## 2022-10-09 NOTE — Assessment & Plan Note (Signed)
Chronic ?continue plavix 75 mg and atorvastatin 40 mg daily ?Cmp, cbc, lipids ?

## 2022-10-09 NOTE — Assessment & Plan Note (Signed)
Chronic Urinary incontinence with frequent urination He feels the Myrbetriq helps, but he only takes it as needed Continue Myrbetriq 25 mg daily as needed

## 2022-10-12 DIAGNOSIS — H2513 Age-related nuclear cataract, bilateral: Secondary | ICD-10-CM | POA: Diagnosis not present

## 2022-10-12 DIAGNOSIS — H2511 Age-related nuclear cataract, right eye: Secondary | ICD-10-CM | POA: Diagnosis not present

## 2022-11-17 ENCOUNTER — Ambulatory Visit: Payer: Medicare Other | Admitting: Urgent Care

## 2022-11-17 ENCOUNTER — Encounter: Payer: Self-pay | Admitting: Internal Medicine

## 2022-11-17 ENCOUNTER — Ambulatory Visit (INDEPENDENT_AMBULATORY_CARE_PROVIDER_SITE_OTHER): Payer: Medicare Other | Admitting: Internal Medicine

## 2022-11-17 VITALS — BP 180/80 | HR 87 | Temp 97.8°F | Ht 74.0 in | Wt 179.0 lb

## 2022-11-17 DIAGNOSIS — R42 Dizziness and giddiness: Secondary | ICD-10-CM

## 2022-11-17 DIAGNOSIS — M25512 Pain in left shoulder: Secondary | ICD-10-CM

## 2022-11-17 DIAGNOSIS — I6523 Occlusion and stenosis of bilateral carotid arteries: Secondary | ICD-10-CM

## 2022-11-17 MED ORDER — PREDNISONE 20 MG PO TABS: 40.0000 mg | ORAL_TABLET | Freq: Every day | ORAL | 0 refills | Status: DC

## 2022-11-17 MED ORDER — MECLIZINE HCL 12.5 MG PO TABS: 12.5000 mg | ORAL_TABLET | Freq: Three times a day (TID) | ORAL | 2 refills | Status: DC | PRN

## 2022-11-17 MED ORDER — TIZANIDINE HCL 2 MG PO TABS: 2.0000 mg | ORAL_TABLET | Freq: Every day | ORAL | 0 refills | Status: DC | PRN

## 2022-11-17 NOTE — Progress Notes (Signed)
   Subjective:   Patient ID: Ronald Stafford, male    DOB: [birth year], 89 y.o.   MRN: [id number]  HPI The patient is an 89 YO man coming in for shoulder pain 2-3 weeks since he had 11 trees cut down and helped with clean up of this. No weakness or numbness. Needs refill on meclizine.  Review of Systems  Constitutional: Negative.   HENT: Negative.    Eyes: Negative.   Respiratory:  Negative for cough, chest tightness and shortness of breath.   Cardiovascular:  Negative for chest pain, palpitations and leg swelling.  Gastrointestinal:  Negative for abdominal distention, abdominal pain, constipation, diarrhea, nausea and vomiting.  Musculoskeletal:  Positive for arthralgias.  Skin: Negative.   Neurological: Negative.   Psychiatric/Behavioral: Negative.      Objective:  Physical Exam Constitutional:      Appearance: He is well-developed.  HENT:     Head: Normocephalic and atraumatic.  Cardiovascular:     Rate and Rhythm: Normal rate and regular rhythm.  Pulmonary:     Effort: Pulmonary effort is normal. No respiratory distress.     Breath sounds: Normal breath sounds. No wheezing or rales.  Abdominal:     General: Bowel sounds are normal. There is no distension.     Palpations: Abdomen is soft.     Tenderness: There is no abdominal tenderness. There is no rebound.  Musculoskeletal:        General: Tenderness present.     Cervical back: Normal range of motion.     Comments: Pain in the triceps region   Skin:    General: Skin is warm and dry.  Neurological:     Mental Status: He is alert and oriented to person, place, and time.     Coordination: Coordination normal.     Vitals:   11/17/22 0903 11/17/22 0905  BP: (!) 180/80 (!) 180/80  Pulse: 87   Temp: 97.8 F (36.6 C)   TempSrc: Oral   SpO2: 96%   Weight: 179 lb (81.2 kg)   Height: 6\' 2"  (1.88 m)     Assessment & Plan:

## 2022-11-17 NOTE — Assessment & Plan Note (Signed)
Minimal disease in 2018 which has not been rechecked. Offered with dizziness on standing but he declines at this time. Is taking lipitor 40 mg daily and plavix daily which he will continue.

## 2022-11-17 NOTE — Assessment & Plan Note (Addendum)
Description sounds to be more related to standing and moving too quickly. Discussed compression stockings and being well hydrated. He does rarely use meclizine and feels this is helpful. Refilled today. His falls have all been without a cane/walking stick or walker so he is advised to use this at all times. Advised to stand slowly and start to walk slowly.

## 2022-11-17 NOTE — Patient Instructions (Signed)
We have sent in prednisone to take 2 pills daily for 5 days then to help.  We have sent in tizanidine to use daily as needed for pain this is a muscle relaxer.   We have refilled the meclizine.

## 2022-11-17 NOTE — Assessment & Plan Note (Signed)
Rx prednisone 5 day course and tizanidine for muscle relaxer. Can use tylenol for pain as well.

## 2022-12-04 ENCOUNTER — Encounter: Payer: Self-pay | Admitting: Family Medicine

## 2022-12-04 ENCOUNTER — Ambulatory Visit (INDEPENDENT_AMBULATORY_CARE_PROVIDER_SITE_OTHER): Payer: Medicare Other | Admitting: Family Medicine

## 2022-12-04 VITALS — BP 120/50 | HR 78 | Temp 98.0°F | Resp 20 | Ht 74.0 in | Wt 177.0 lb

## 2022-12-04 DIAGNOSIS — I951 Orthostatic hypotension: Secondary | ICD-10-CM | POA: Diagnosis not present

## 2022-12-04 DIAGNOSIS — Z23 Encounter for immunization: Secondary | ICD-10-CM

## 2022-12-04 NOTE — Progress Notes (Signed)
Assessment & Plan:  1. Orthostatic hypotension Education provided on orthostatic hypotension.  Encourage patient to increase his water intake to 4 16 oz water bottles per day.  Advised that he start wearing compression hose to be applied in the morning and removed at night.  Asked that he schedule an appointment with his cardiologist to further discuss orthostatic hypotension.  We also discussed taking his time when going from lying to sitting and sitting to standing to prevent falls.  2. Encounter for immunization - Flu Vaccine Trivalent High Dose (Fluad)   Follow up plan: Return if symptoms worsen or fail to improve.  Deliah Boston, MSN, APRN, FNP-C  Subjective:  HPI: Ronald Stafford is a 89 y.o. male presenting on 12/04/2022 for Dizziness (Going on for about a month. Was seen on 9/6, given Meclizine, only helps a little )  Patient is here with concerns about ongoing dizziness upon standing.  He was seen on 11/17/2022 at which time he declined reassessing his carotid arterial disease; his meclizine was refilled.  Previously his meclizine was helpful, however he reports today it only helps a little bit.  He was also advised to wear compression stockings and stay well-hydrated. He has not been wearing compression hose. One week ago he started drinking more water and less coffee. He is drinking 2-3 16oz bottles of water per day.     ROS: Negative unless specifically indicated above in HPI.   Relevant past medical history reviewed and updated as indicated.   Allergies and medications reviewed and updated.   Current Outpatient Medications:    acetaminophen (TYLENOL) 500 MG tablet, Take 1,000 mg by mouth every 6 (six) hours as needed for mild pain., Disp: , Rfl:    atorvastatin (LIPITOR) 40 MG tablet, Take 1 tablet (40 mg total) by mouth daily., Disp: 90 tablet, Rfl: 2   carvedilol (COREG) 6.25 MG tablet, Take 1 tablet (6.25 mg total) by mouth 2 (two) times daily with a meal., Disp: 60  tablet, Rfl: 2   clopidogrel (PLAVIX) 75 MG tablet, Take 1 tablet (75 mg total) by mouth every morning., Disp: 90 tablet, Rfl: 5   ipratropium-albuterol (DUONEB) 0.5-2.5 (3) MG/3ML SOLN, Take 3 mLs by nebulization every 6 (six) hours as needed (shortness of breath)., Disp: , Rfl:    meclizine (ANTIVERT) 12.5 MG tablet, Take 1 tablet (12.5 mg total) by mouth 3 (three) times daily as needed for dizziness., Disp: 60 tablet, Rfl: 2   MYRBETRIQ 25 MG TB24 tablet, TAKE 1 TABLET BY MOUTH ONCE DAILY AS NEEDED, Disp: 90 tablet, Rfl: 0   Omega-3 Fatty Acids (FISH OIL) 500 MG CAPS, Take 500 mg by mouth daily., Disp: , Rfl:    tiZANidine (ZANAFLEX) 2 MG tablet, Take 1 tablet (2 mg total) by mouth daily as needed for muscle spasms., Disp: 15 tablet, Rfl: 0   valsartan (DIOVAN) 80 MG tablet, Take 1 tablet (80 mg total) by mouth daily., Disp: 90 tablet, Rfl: 3  Allergies  Allergen Reactions   Chicken Allergy Nausea And Vomiting and Other (See Comments)    Digestive problems    Objective:   BP (!) 120/50   Pulse 78   Temp 98 F (36.7 C)   Resp 20   Ht 6\' 2"  (1.88 m)   Wt 177 lb (80.3 kg)   BMI 22.73 kg/m    Physical Exam Vitals reviewed.  Constitutional:      General: He is not in acute distress.    Appearance: Normal appearance.  He is not ill-appearing, toxic-appearing or diaphoretic.  HENT:     Head: Normocephalic and atraumatic.  Eyes:     General: No scleral icterus.       Right eye: No discharge.        Left eye: No discharge.     Conjunctiva/sclera: Conjunctivae normal.  Cardiovascular:     Rate and Rhythm: Normal rate and regular rhythm.     Heart sounds: Normal heart sounds. No murmur heard.    No friction rub. No gallop.  Pulmonary:     Effort: Pulmonary effort is normal. No respiratory distress.     Breath sounds: Normal breath sounds. No stridor. No wheezing, rhonchi or rales.  Musculoskeletal:        General: Normal range of motion.     Cervical back: Normal range of  motion.  Skin:    General: Skin is warm and dry.  Neurological:     Mental Status: He is alert and oriented to person, place, and time. Mental status is at baseline.  Psychiatric:        Mood and Affect: Mood normal.        Behavior: Behavior normal.        Thought Content: Thought content normal.        Judgment: Judgment normal.

## 2022-12-04 NOTE — Patient Instructions (Addendum)
Goal water for the day is 4 water bottles.  Start wearing compression hose - apply in the morning, remove at night.  Schedule an appointment with cardiology to discuss orthostatic hypotension.

## 2022-12-24 ENCOUNTER — Encounter: Payer: Self-pay | Admitting: Internal Medicine

## 2022-12-24 NOTE — Patient Instructions (Incomplete)
      Blood work was ordered.   The lab is on the first floor.    Medications changes include :   none    A Ct scan of your chest was ordered for [facility] was ordered and someone will call you to schedule an appointment.   A referral was ordered for physical therapy for your dizziness and a referral was ordered for sports medicine for your shoulder.    Return in about 6 months (around 06/25/2023) for follow up.

## 2022-12-24 NOTE — Progress Notes (Unsigned)
Subjective:    Patient ID: Ronald Stafford, male    DOB: 23-Dec-1930, 89 y.o.   MRN: [id number]      HPI Ronald Stafford is here for No chief complaint on file.        Medications and allergies reviewed with patient and updated if appropriate.  Current Outpatient Medications on File Prior to Visit  Medication Sig Dispense Refill   acetaminophen (TYLENOL) 500 MG tablet Take 1,000 mg by mouth every 6 (six) hours as needed for mild pain.     atorvastatin (LIPITOR) 40 MG tablet Take 1 tablet (40 mg total) by mouth daily. 90 tablet 2   carvedilol (COREG) 6.25 MG tablet Take 1 tablet (6.25 mg total) by mouth 2 (two) times daily with a meal. 60 tablet 2   clopidogrel (PLAVIX) 75 MG tablet Take 1 tablet (75 mg total) by mouth every morning. 90 tablet 5   ipratropium-albuterol (DUONEB) 0.5-2.5 (3) MG/3ML SOLN Take 3 mLs by nebulization every 6 (six) hours as needed (shortness of breath).     meclizine (ANTIVERT) 12.5 MG tablet Take 1 tablet (12.5 mg total) by mouth 3 (three) times daily as needed for dizziness. 60 tablet 2   MYRBETRIQ 25 MG TB24 tablet TAKE 1 TABLET BY MOUTH ONCE DAILY AS NEEDED 90 tablet 0   Omega-3 Fatty Acids (FISH OIL) 500 MG CAPS Take 500 mg by mouth daily.     tiZANidine (ZANAFLEX) 2 MG tablet Take 1 tablet (2 mg total) by mouth daily as needed for muscle spasms. 15 tablet 0   valsartan (DIOVAN) 80 MG tablet Take 1 tablet (80 mg total) by mouth daily. 90 tablet 3   No current facility-administered medications on file prior to visit.    Review of Systems     Objective:  There were no vitals filed for this visit. BP Readings from Last 3 Encounters:  12/04/22 (!) 120/50  11/17/22 (!) 180/80  06/05/22 (!) 146/88   Wt Readings from Last 3 Encounters:  12/04/22 177 lb (80.3 kg)  11/17/22 179 lb (81.2 kg)  08/28/22 180 lb (81.6 kg)   There is no height or weight on file to calculate BMI.    Physical Exam         Assessment & Plan:    See Problem List for  Assessment and Plan of chronic medical problems.

## 2022-12-25 ENCOUNTER — Ambulatory Visit (INDEPENDENT_AMBULATORY_CARE_PROVIDER_SITE_OTHER): Payer: Medicare Other | Admitting: Internal Medicine

## 2022-12-25 VITALS — BP 148/80 | HR 80 | Temp 98.4°F | Ht 74.0 in | Wt 177.0 lb

## 2022-12-25 DIAGNOSIS — R918 Other nonspecific abnormal finding of lung field: Secondary | ICD-10-CM | POA: Diagnosis not present

## 2022-12-25 DIAGNOSIS — R42 Dizziness and giddiness: Secondary | ICD-10-CM | POA: Diagnosis not present

## 2022-12-25 DIAGNOSIS — M25512 Pain in left shoulder: Secondary | ICD-10-CM

## 2022-12-25 DIAGNOSIS — E7849 Other hyperlipidemia: Secondary | ICD-10-CM

## 2022-12-25 DIAGNOSIS — R7303 Prediabetes: Secondary | ICD-10-CM

## 2022-12-25 DIAGNOSIS — I10 Essential (primary) hypertension: Secondary | ICD-10-CM | POA: Diagnosis not present

## 2022-12-25 DIAGNOSIS — H8110 Benign paroxysmal vertigo, unspecified ear: Secondary | ICD-10-CM

## 2022-12-25 LAB — CBC WITH DIFFERENTIAL/PLATELET
Basophils Absolute: 0.1 10*3/uL (ref 0.0–0.1)
Basophils Relative: 2.5 % (ref 0.0–3.0)
Eosinophils Absolute: 0.1 10*3/uL (ref 0.0–0.7)
Eosinophils Relative: 1.8 % (ref 0.0–5.0)
HCT: 42.5 % (ref 39.0–52.0)
Hemoglobin: 14.1 g/dL (ref 13.0–17.0)
Lymphocytes Relative: 16.3 % (ref 12.0–46.0)
Lymphs Abs: 0.8 10*3/uL (ref 0.7–4.0)
MCHC: 33.3 g/dL (ref 30.0–36.0)
MCV: 94.5 fL (ref 78.0–100.0)
Monocytes Absolute: 0.7 10*3/uL (ref 0.1–1.0)
Monocytes Relative: 14.9 % — ABNORMAL HIGH (ref 3.0–12.0)
Neutro Abs: 3.2 10*3/uL (ref 1.4–7.7)
Neutrophils Relative %: 64.5 % (ref 43.0–77.0)
Platelets: 141 10*3/uL — ABNORMAL LOW (ref 150.0–400.0)
RBC: 4.5 Mil/uL (ref 4.22–5.81)
RDW: 15.6 % — ABNORMAL HIGH (ref 11.5–15.5)
WBC: 4.9 10*3/uL (ref 4.0–10.5)

## 2022-12-25 LAB — COMPREHENSIVE METABOLIC PANEL WITH GFR
ALT: 22 U/L (ref 0–53)
AST: 22 U/L (ref 0–37)
Albumin: 4.3 g/dL (ref 3.5–5.2)
Alkaline Phosphatase: 57 U/L (ref 39–117)
BUN: 15 mg/dL (ref 6–23)
CO2: 26 meq/L (ref 19–32)
Calcium: 9.6 mg/dL (ref 8.4–10.5)
Chloride: 108 meq/L (ref 96–112)
Creatinine, Ser: 0.81 mg/dL (ref 0.40–1.50)
GFR: 77.85 mL/min
Glucose, Bld: 89 mg/dL (ref 70–99)
Potassium: 3.9 meq/L (ref 3.5–5.1)
Sodium: 142 meq/L (ref 135–145)
Total Bilirubin: 1.2 mg/dL (ref 0.2–1.2)
Total Protein: 6.9 g/dL (ref 6.0–8.3)

## 2022-12-25 LAB — LIPID PANEL
Cholesterol: 103 mg/dL (ref 0–200)
HDL: 41.1 mg/dL (ref >39.00)
LDL Cholesterol: 45 mg/dL (ref 0–99)
NonHDL: 62.03
Total CHOL/HDL Ratio: 3
Triglycerides: 85 mg/dL (ref 0.0–149.0)
VLDL: 17 mg/dL (ref 0.0–40.0)

## 2022-12-25 LAB — HEMOGLOBIN A1C: Hgb A1c MFr Bld: 5.6 % (ref 4.6–6.5)

## 2022-12-25 NOTE — Assessment & Plan Note (Signed)
Chronic Check a1c Low sugar / carb diet

## 2022-12-25 NOTE — Assessment & Plan Note (Signed)
Chronic BP adequately controlled Concern with possible orthostatic lightheadedness so will not adjust medications-overall seems to be reasonably controlled CMP, CBC Continue coreg 6.25 mg bid, valsartan 80 mg daily

## 2022-12-25 NOTE — Assessment & Plan Note (Signed)
Chronic Likely has a combination of lightheadedness and dizziness He does have symptoms consistent with BPPV-will refer to physical therapy to see if that helps Has tried meclizine in the past Typically stays hydrated Blood pressure is reasonably controlled here today and not too low Was suggested that he try compression socks which he has not done-encouraged for him to try that to see if that helps Fall precautions discussed Often uses a cane-advised using a walker more

## 2022-12-25 NOTE — Assessment & Plan Note (Signed)
Chronic Check lipid panel  Continue atorvastatin 40 mg daily

## 2022-12-25 NOTE — Assessment & Plan Note (Addendum)
Chronic Has had left shoulder pain for a while with intermittent flares related to activity or falls Has not had official evaluation in the past Referral ordered to sports medicine

## 2022-12-29 ENCOUNTER — Ambulatory Visit (HOSPITAL_BASED_OUTPATIENT_CLINIC_OR_DEPARTMENT_OTHER)
Admission: RE | Admit: 2022-12-29 | Discharge: 2022-12-29 | Disposition: A | Discharge status: Home / Self Care | Payer: Medicare Other | Source: Ambulatory Visit | Attending: Internal Medicine | Admitting: Internal Medicine

## 2022-12-29 DIAGNOSIS — J432 Centrilobular emphysema: Secondary | ICD-10-CM | POA: Diagnosis not present

## 2022-12-29 DIAGNOSIS — R918 Other nonspecific abnormal finding of lung field: Secondary | ICD-10-CM | POA: Insufficient documentation

## 2022-12-29 NOTE — Progress Notes (Unsigned)
Rubin Payor, PhD, LAT, ATC acting as a scribe for Clementeen Graham, MD.  Ronald Stafford is a 89 y.o. male who presents to [facility] at [facility] today for L shoulder pain ongoing since last weekend. Pain was intermittent and flares. He suffered a fall, falling backwards, landing on the posterior aspect of his L shoulder. He notes he feels off balance when he 1st transitions to stand. Pt locates pain to all over the L GH joint.   He estimates that in the last 6 months he has had 4 falls.  He uses a cane to ambulate.  He does have a walker but does not use it.  Dx imaging: 04/25/21 C-spine XR  01/03/19 L shoulder XR  Pertinent review of systems: No fevers or chills  Relevant historical information: PAD.  Carotid artery disease.  Hypertension.   Exam:  BP (!) 148/80   Pulse 80   Ht 6\' 2"  (1.88 m)   Wt 180 lb (81.6 kg)   SpO2 96%   BMI 23.11 kg/m  General: Well Developed, well nourished, and in no acute distress.   MSK: Left shoulder normal appearing. Intact range of motion pain with abduction. Strength 4/5 abduction and external rotation. Pulses cap refill and sensation are intact distally.    Lab and Radiology Results  Procedure: Real-time Ultrasound Guided Injection of left shoulder glenohumeral joint posterior approach Device: Philips Affiniti 50G/GE Logiq Images permanently stored and available for review in PACS Verbal informed consent obtained.  Discussed risks and benefits of procedure. Warned about infection, bleeding, hyperglycemia damage to structures among others. Patient expresses understanding and agreement Time-out conducted.   Noted no overlying erythema, induration, or other signs of local infection.   Skin prepped in a sterile fashion.   Local anesthesia: Topical Ethyl chloride.   With sterile technique and under real time ultrasound guidance: 40 mg of Kenalog and 2 mL of Marcaine injected into glenohumeral joint. Fluid seen entering the  joint capsule.   Completed without difficulty   Pain moderately  resolved suggesting accurate placement of the medication.   Advised to call if fevers/chills, erythema, induration, drainage, or persistent bleeding.   Images permanently stored and available for review in the ultrasound unit.  Impression: Technically successful ultrasound guided injection.    X-ray images left shoulder obtained today personally and independently interpreted. Glenohumeral DJD.  No acute fractures are visible. Await formal radiology review     Assessment and Plan: 89 y.o. male with left shoulder pain after fall thought to be due to exacerbation of DJD.  He may have a rotator cuff tear as well but is not a good surgical candidate for rotator cuff repair.  Plan for steroid injection and physical therapy.  Physical therapy should help his shoulder pain.  However more importantly I would like physical therapy to continue to work on fall prevention.  Recommend using a walker all the time.  Check with physical therapy and recheck back with me in 6 weeks.  Could transition to home health physical therapy if needed.  He is driving some but is in the process of giving up driving.   PDMP not reviewed this encounter. Orders Placed This Encounter  Procedures   [place] LIMITED JOINT SPACE STRUCTURES UP BILAT(NO LINKED CHARGES)    Order Specific Question:   Reason for Exam (SYMPTOM  OR DIAGNOSIS REQUIRED)    Answer:   left shoulder pain    Order Specific Question:   Preferred imaging location?  Answer:   [facility] Shoulder Left    Standing Status:   Future    Number of Occurrences:   1    Standing Expiration Date:   02/01/2023    Order Specific Question:   Reason for Exam (SYMPTOM  OR DIAGNOSIS REQUIRED)    Answer:   left shoulder pain    Order Specific Question:   Preferred imaging location?    Answer:   [facility]   Ambulatory referral to Physical Therapy    Referral  Priority:   Routine    Referral Type:   Physical Medicine    Referral Reason:   Specialty Services Required    Requested Specialty:   Physical Therapy    Number of Visits Requested:   1   No orders of the defined types were placed in this encounter.    Discussed warning signs or symptoms. Please see discharge instructions. Patient expresses understanding.   The above documentation has been reviewed and is accurate and complete Clementeen Graham, M.D.

## 2023-01-01 ENCOUNTER — Other Ambulatory Visit: Payer: Self-pay

## 2023-01-01 ENCOUNTER — Ambulatory Visit: Payer: Medicare Other | Admitting: Family Medicine

## 2023-01-01 ENCOUNTER — Ambulatory Visit (INDEPENDENT_AMBULATORY_CARE_PROVIDER_SITE_OTHER): Payer: Medicare Other

## 2023-01-01 VITALS — BP 148/80 | HR 80 | Ht 74.0 in | Wt 180.0 lb

## 2023-01-01 DIAGNOSIS — M19012 Primary osteoarthritis, left shoulder: Secondary | ICD-10-CM | POA: Diagnosis not present

## 2023-01-01 DIAGNOSIS — M25512 Pain in left shoulder: Secondary | ICD-10-CM

## 2023-01-01 DIAGNOSIS — R296 Repeated falls: Secondary | ICD-10-CM | POA: Diagnosis not present

## 2023-01-01 DIAGNOSIS — M85812 Other specified disorders of bone density and structure, left shoulder: Secondary | ICD-10-CM | POA: Diagnosis not present

## 2023-01-01 NOTE — Patient Instructions (Addendum)
Thank you for coming in today.   I've referred you to Physical Therapy.  Let us know if you don't hear from them in one week.   You received an injection today. Seek immediate medical attention if the joint becomes red, extremely painful, or is oozing fluid.   Check back in 6 weeks

## 2023-01-03 NOTE — Therapy (Signed)
OUTPATIENT PHYSICAL THERAPY VESTIBULAR EVALUATION     Patient Name: Ronald Stafford MRN: [id number] DOB:10-25-1930, 89 y.o., male Today's Date: 01/04/2023  END OF SESSION:  PT End of Session - 01/04/23 1231     Visit Number 1    Number of Visits 13    Date for PT Re-Evaluation 02/15/23    Authorization Type [employer] Medicare    PT Start Time 1145    PT Stop Time 1228    PT Time Calculation (min) 43 min    Activity Tolerance Patient tolerated treatment well    Behavior During Therapy [facility] for tasks assessed/performed             Past Medical History:  Diagnosis Date   Cancer (HCC) 2004   prostate cancer, Dr. Isabel Caprice   Colon polyps    Deep venous thrombosis (HCC) 1997   Postop   Depression    GERD (gastroesophageal reflux disease)    Hyperlipidemia    Hypertension    Radial head fracture, closed 09/18/2013   Shoulder fracture, left 02/23/2011   immobilized in sling , Dr Norris(no surgery)   Past Surgical History:  Procedure Laterality Date   COLONOSCOPY  2006   Negative,Dr.Edwards   colonoscopy with polypectomy      PMH of   ESOPHAGOGASTRODUODENOSCOPY N/A 02/23/2017   Procedure: ESOPHAGOGASTRODUODENOSCOPY (EGD);  Surgeon: Kathi Der, MD;  Location: [facility];  Service: Gastroenterology;  Laterality: N/A;   JOINT REPLACEMENT  1997, 2001   Total Hip replacement X 2   LUMBAR SPINE SURGERY  09/28/2010   Dr Shon Baton; for spinal stenosis   MECKEL DIVERTICULUM EXCISION     Age 59   PROSTATECTOMY  2004   w/ radiation, Dr Isabel Caprice   TOTAL HIP REVISION Left 02/14/2017   Procedure: Left hip polyethylene EXCHANGE;  Surgeon: Ollen Gross, MD;  Location: [facility];  Service: Orthopedics;  Laterality: Left;   VASECTOMY     Patient Active Problem List   Diagnosis Date Noted   Right buttock pain 05/30/2022   Hematoma of muscle 05/09/2022   Frequent falls 05/09/2022   Hypomagnesemia 04/10/2022   PAD (peripheral artery disease) (HCC) 04/07/2022   Chest pain 04/06/2022    Abdominal pain 04/06/2022   Carpal tunnel syndrome, right upper limb 05/24/2021   Cubital tunnel syndrome on right 05/24/2021   Renal cyst, left 06/15/2020   Gross hematuria 06/15/2020   Cervical radiculopathy 06/01/2020   Memory difficulty 06/01/2020   Dizziness 01/03/2019   Urinary incontinence 10/31/2018   Right hand paresthesia 08/10/2017   Skin abnormalities 06/13/2017   History of total hip arthroplasty 03/28/2017   Failed total hip arthroplasty (HCC) 02/14/2017   Osteoarthritis of left hip 12/10/2016   Left shoulder pain 10/04/2016   History of DVT (deep vein thrombosis) 07/30/2016   History of TIA (transient ischemic attack) 07/30/2016   Thrombocytopenia (HCC) 07/30/2016   Carotid arterial disease (HCC) 07/20/2016   Right sided weakness 06/27/2016   Frequent headaches 06/22/2015   Prediabetes 02/11/2015   Peroneal tendonitis of right lower extremity 01/12/2014   Mass of right ankle 12/31/2013   Lung nodule, solitary 08/08/2012   SPINAL STENOSIS, LUMBAR 04/06/2010   FOOT DROP, RIGHT 05/21/2008   Hyperlipidemia 12/25/2006   Essential hypertension 12/25/2006   PROSTATE CANCER, HX OF 12/25/2006   POLYP, COLON 05/02/2006    PCP:   Pincus Sanes, MD   REFERRING PROVIDER:   Pincus Sanes, MD    REFERRING DIAG: H81.10 (ICD-10-CM) - Benign paroxysmal positional vertigo, unspecified  laterality  THERAPY DIAG:  Dizziness and giddiness  Unsteadiness on feet  Other abnormalities of gait and mobility  ONSET DATE: 1 year  Rationale for Evaluation and Treatment: Rehabilitation  SUBJECTIVE:   SUBJECTIVE STATEMENT: Patient reports imbalance and feeling of falling backwards when standing up. Reports occasional dizziness when "things move too fast around me" such as "kids running." Also reports dizziness with quick head/body movements. Denies head trauma, infection/illness, vision changes/double vision, hearing loss, tinnitus, otalgia, migraines. Had a HA 2 different days  last week which was unusual. Mentions B cataract surgery sept 2024.    Pt accompanied by: self  PERTINENT HISTORY: Prostate CA s/p prostatectomy 2004, hx DVT, HLD, HTN, L shoulder fx, radial head fx, THA x2, lumbar surgery  PAIN:  Are you having pain? No  PRECAUTIONS: Fall  RED FLAGS: None   WEIGHT BEARING RESTRICTIONS: No  FALLS: Has patient fallen in last 6 months? Yes. Number of falls 2 when getting up in the middle of the night   LIVING ENVIRONMENT: Lives with: lives alone Lives in: House/apartment Stairs:  2 steps to enter; basement for laundry with handrails  Has following equipment at home: Single point cane, Walker - 2 wheeled, and Environmental consultant - 4 wheeled (reports that he needs a shower seat)  PLOF: Independent- limited cooking   PATIENT GOALS: improve balance and dizziness   OBJECTIVE:  Note: Objective measures were completed at Evaluation unless otherwise noted.  DIAGNOSTIC FINDINGS: 04/07/22 head CT: IMPRESSION: No acute intracranial abnormality.  COGNITION: Overall cognitive status: Within functional limits for tasks assessed   SENSATION: Reports chronic N/T in B hands   POSTURE:  rounded shoulders, forward head, and increased thoracic kyphosis   GAIT: Gait pattern: wide BOS, unsteady; pt also particularly unsteady with transfers  Assistive device utilized:  hurrycane Level of assistance: SBA and CGA   PATIENT SURVEYS:  FOTO NT  VESTIBULAR ASSESSMENT:  GENERAL OBSERVATION: pt wears readers    OCULOMOTOR EXAM:  Ocular Alignment:  head tilted L, R eye slightly elevated but without ocular torsion  Ocular ROM: No Limitations  Spontaneous Nystagmus: very subtle R beating nystagmus   Gaze-Induced Nystagmus: absent  Smooth Pursuits: intact c/o "something moving bothers me"  Saccades: intact c/o "my head's going round and round"  VESTIBULAR - OCULAR REFLEX:   Slow VOR: Normal c/o dizzy horizontal, asymptomatic vertical   VOR Cancellation:  Normal  Head-Impulse Test: HIT Right: positive HIT Left: positive *c/o dizziness     POSITIONAL TESTING:  Right Roll Test: R beating nystagmus persistent without dizziness Left Roll Test: negative  Right Sidelying: symptomatic but R beating nystagmus evident; c/o dizziness upon sitting up Left Sidelying: negative; c/o dizziness upon sitting up    VESTIBULAR TREATMENT:                                                                                                   DATE: 01/04/23   PATIENT EDUCATION: Education details: prognosis, POC, HEP Person educated: Patient Education method: Explanation, Demonstration, Tactile cues, Verbal cues, and Handouts Education comprehension: verbalized understanding and returned demonstration  HOME EXERCISE PROGRAM:  Access Code: [email or web address] URL: [email or web address] Date: 01/04/2023 Prepared by: [facility] - Outpatient  Rehab - [facility]  Exercises - Seated Right Head Turns Vestibular Habituation  - 1 x daily - 5 x weekly - 2 sets - 30 sec hold - Seated Head Nod Vestibular Habituation  - 1 x daily - 5 x weekly - 2 sets - 30 sec hold - Brandt-Daroff Vestibular Exercise  - 1 x daily - 5 x weekly - 2 sets - 3-5 reps  GOALS: Goals reviewed with patient? Yes  SHORT TERM GOALS: Target date: 01/25/2023  Patient to be independent with initial HEP. Baseline: HEP initiated Goal status: INITIAL    LONG TERM GOALS: Target date: 02/15/2023  Patient to be independent with advanced HEP. Baseline: Not yet initiated  Goal status: INITIAL  Patient to report 0/10 dizziness with standing  horizontal VOR for 30 seconds. Baseline: Unable Goal status: INITIAL  Patient will report 0/10 dizziness with bed mobility.  Baseline: Symptomatic  Goal status: INITIAL  Patient to report resolution of dizziness with visual movement provocation.  Baseline: symptomatic  Goal status: INITIAL  Patient to score at least 45/56 on Berg in order to  decrease risk of falls. Baseline: NT Goal status: INITIAL  Patient to complete 5xSTS in 20 sec with good stability. Baseline: NT Goal status: INITIAL  Patient to score at least x on FOTO in order to indicate improved functional outcomes.  Baseline: NT Goal status: INITIAL   ASSESSMENT:  CLINICAL IMPRESSION:   Patient is a 89 y/o M presenting to OPPT with c/o dizziness and imbalance for the past year. Denies head trauma, infection/illness, vision changes/double vision, hearing loss, tinnitus, otalgia, migraines. Does report a HA 2 different days last week which was unusual for him. Mentions B cataract surgery Sept. 2024. Patient today with Unsteadiness with gait and transfers, spontaneous R beating nystagmus, symptoms with smooth pursuits, saccades, horizontal VOR, positive B HIT, motion sensitivity with bed mobility. Patient was educated on gentle gaze stability and habituation HEP and reported understanding. Would benefit from skilled PT services 1-2x/week for 6 weeks to address aforementioned impairments in order to optimize level of function.    OBJECTIVE IMPAIRMENTS: Abnormal gait, decreased activity tolerance, decreased balance, difficulty walking, and dizziness.   ACTIVITY LIMITATIONS: carrying, lifting, bending, sitting, standing, squatting, sleeping, stairs, transfers, bed mobility, bathing, toileting, dressing, reach over head, hygiene/grooming, and locomotion level  PARTICIPATION LIMITATIONS: meal prep, cleaning, laundry, shopping, community activity, occupation, and church  PERSONAL FACTORS: Age, Fitness, Past/current experiences, Time since onset of injury/illness/exacerbation, and 3+ comorbidities: Prostate CA s/p prostatectomy 2004, hx DVT, HLD, HTN, L shoulder fx, radial head fx, THA x2, lumbar surgery  are also affecting patient's functional outcome.   REHAB POTENTIAL: Good  CLINICAL DECISION MAKING: Evolving/moderate complexity  EVALUATION COMPLEXITY:  Moderate   PLAN:  PT FREQUENCY: 1-2x/week  PT DURATION: 6 weeks  PLANNED INTERVENTIONS: 97164- PT Re-evaluation, 97110-Therapeutic exercises, 97530- Therapeutic activity, [id number]- Neuromuscular re-education, 97535- Self Care, [id number]- Manual therapy, [id number]- Gait training, [phone]- Canalith repositioning, [id number]- Aquatic Therapy, 97014- Electrical stimulation (unattended), [phone]- Electrical stimulation (manual), Patient/Family education, Balance training, Stair training, Taping, Dry Needling, Joint mobilization, Vestibular training, Cryotherapy, and Moist heat  PLAN FOR NEXT SESSION: FOTO and make goal, check orthostatics. review HEP, 5xSTS, Gaylyn Lambert, [place], DPT 01/04/23 12:43 PM  [facility] at [facility] [address] [place], [address] [place], [place] [address] Phone # [phone] Fax # [phone]

## 2023-01-04 ENCOUNTER — Other Ambulatory Visit: Payer: Self-pay

## 2023-01-04 ENCOUNTER — Encounter: Payer: Self-pay | Admitting: Physical Therapy

## 2023-01-04 ENCOUNTER — Ambulatory Visit: Payer: Medicare Other | Attending: Internal Medicine | Admitting: Physical Therapy

## 2023-01-04 DIAGNOSIS — R2689 Other abnormalities of gait and mobility: Secondary | ICD-10-CM | POA: Insufficient documentation

## 2023-01-04 DIAGNOSIS — R2681 Unsteadiness on feet: Secondary | ICD-10-CM | POA: Insufficient documentation

## 2023-01-04 DIAGNOSIS — R42 Dizziness and giddiness: Secondary | ICD-10-CM | POA: Diagnosis not present

## 2023-01-04 DIAGNOSIS — H8110 Benign paroxysmal vertigo, unspecified ear: Secondary | ICD-10-CM | POA: Diagnosis not present

## 2023-01-08 NOTE — Therapy (Signed)
OUTPATIENT PHYSICAL THERAPY VESTIBULAR TREATMENT     Patient Name: Ronald Stafford MRN: [id number] DOB:Dec 03, 1930, 89 y.o., male Today's Date: 01/09/2023  END OF SESSION:  PT End of Session - 01/09/23 1013     Visit Number 2    Number of Visits 13    Date for PT Re-Evaluation 02/15/23    Authorization Type [employer] Medicare    PT Start Time 0931    PT Stop Time 1014    PT Time Calculation (min) 43 min    Equipment Utilized During Treatment Gait belt    Activity Tolerance Patient tolerated treatment well    Behavior During Therapy WFL for tasks assessed/performed              Past Medical History:  Diagnosis Date   Cancer (HCC) 2004   prostate cancer, Dr. Isabel Caprice   Colon polyps    Deep venous thrombosis (HCC) 1997   Postop   Depression    GERD (gastroesophageal reflux disease)    Hyperlipidemia    Hypertension    Radial head fracture, closed 09/18/2013   Shoulder fracture, left 02/23/2011   immobilized in sling , Dr Norris(no surgery)   Past Surgical History:  Procedure Laterality Date   COLONOSCOPY  2006   Negative,Dr.Edwards   colonoscopy with polypectomy      PMH of   ESOPHAGOGASTRODUODENOSCOPY N/A 02/23/2017   Procedure: ESOPHAGOGASTRODUODENOSCOPY (EGD);  Surgeon: Kathi Der, MD;  Location: [facility];  Service: Gastroenterology;  Laterality: N/A;   JOINT REPLACEMENT  1997, 2001   Total Hip replacement X 2   LUMBAR SPINE SURGERY  09/28/2010   Dr Shon Baton; for spinal stenosis   MECKEL DIVERTICULUM EXCISION     Age 83   PROSTATECTOMY  2004   w/ radiation, Dr Isabel Caprice   TOTAL HIP REVISION Left 02/14/2017   Procedure: Left hip polyethylene EXCHANGE;  Surgeon: Ollen Gross, MD;  Location: [facility];  Service: Orthopedics;  Laterality: Left;   VASECTOMY     Patient Active Problem List   Diagnosis Date Noted   Right buttock pain 05/30/2022   Hematoma of muscle 05/09/2022   Frequent falls 05/09/2022   Hypomagnesemia 04/10/2022   PAD (peripheral artery  disease) (HCC) 04/07/2022   Chest pain 04/06/2022   Abdominal pain 04/06/2022   Carpal tunnel syndrome, right upper limb 05/24/2021   Cubital tunnel syndrome on right 05/24/2021   Renal cyst, left 06/15/2020   Gross hematuria 06/15/2020   Cervical radiculopathy 06/01/2020   Memory difficulty 06/01/2020   Dizziness 01/03/2019   Urinary incontinence 10/31/2018   Right hand paresthesia 08/10/2017   Skin abnormalities 06/13/2017   History of total hip arthroplasty 03/28/2017   Failed total hip arthroplasty (HCC) 02/14/2017   Osteoarthritis of left hip 12/10/2016   Left shoulder pain 10/04/2016   History of DVT (deep vein thrombosis) 07/30/2016   History of TIA (transient ischemic attack) 07/30/2016   Thrombocytopenia (HCC) 07/30/2016   Carotid arterial disease (HCC) 07/20/2016   Right sided weakness 06/27/2016   Frequent headaches 06/22/2015   Prediabetes 02/11/2015   Peroneal tendonitis of right lower extremity 01/12/2014   Mass of right ankle 12/31/2013   Lung nodule, solitary 08/08/2012   SPINAL STENOSIS, LUMBAR 04/06/2010   FOOT DROP, RIGHT 05/21/2008   Hyperlipidemia 12/25/2006   Essential hypertension 12/25/2006   PROSTATE CANCER, HX OF 12/25/2006   POLYP, COLON 05/02/2006    PCP:   Pincus Sanes, MD   REFERRING PROVIDER:   Pincus Sanes, MD  REFERRING DIAG: H81.10 (ICD-10-CM) - Benign paroxysmal positional vertigo, unspecified laterality  THERAPY DIAG:  Dizziness and giddiness  Unsteadiness on feet  Other abnormalities of gait and mobility  ONSET DATE: 1 year  Rationale for Evaluation and Treatment: Rehabilitation  SUBJECTIVE:   SUBJECTIVE STATEMENT: I'm fine. Dizziness is okay. Been doing my exercises.  Pt accompanied by: self  PERTINENT HISTORY: Prostate CA s/p prostatectomy 2004, hx DVT, HLD, HTN, L shoulder fx, radial head fx, THA x2, lumbar surgery  PAIN:  Are you having pain? No  PRECAUTIONS: Fall  RED FLAGS: None   WEIGHT BEARING  RESTRICTIONS: No  FALLS: Has patient fallen in last 6 months? Yes. Number of falls 2 when getting up in the middle of the night   LIVING ENVIRONMENT: Lives with: lives alone Lives in: House/apartment Stairs:  2 steps to enter; basement for laundry with handrails  Has following equipment at home: Single point cane, Walker - 2 wheeled, and Environmental consultant - 4 wheeled (reports that he needs a shower seat)  PLOF: Independent- limited cooking   PATIENT GOALS: improve balance and dizziness   OBJECTIVE:       TODAY'S TREATMENT: 01/09/23 Activity Comments  FOTO [id number]  5xSTS 39.49 sec pushing off edge of chair ; poor eccentric control   Berg  33/56               Orthostatic Testing   Supine Sitting Standing  x1 Minute Standing x 3 Minutes  BP 157/70 mmHg 137/66 *dizzy 154/78 *dizzy 153/82  HR 66 bpm  75 85 83      [facility] PT Assessment - 01/09/23 0001       Standardized Balance Assessment   Standardized Balance Assessment Berg Balance Test      Berg Balance Test   Sit to Stand Able to stand without using hands and stabilize independently    Standing Unsupported Able to stand 2 minutes with supervision    Sitting with Back Unsupported but Feet Supported on Floor or Stool Able to sit safely and securely 2 minutes    Stand to Sit Sits independently, has uncontrolled descent    Transfers Able to transfer safely, definite need of hands    Standing Unsupported with Eyes Closed Unable to keep eyes closed 3 seconds but stays steady    Standing Unsupported with Feet Together Able to place feet together independently but unable to hold for 30 seconds    From Standing, Reach Forward with Outstretched Arm Can reach confidently >25 cm (10")    From Standing Position, Pick up Object from Floor Able to pick up shoe, needs supervision    From Standing Position, Turn to Look Behind Over each Shoulder Turn sideways only but maintains balance    Turn 360 Degrees Able to turn 360 degrees safely  but slowly    Standing Unsupported, Alternately Place Feet on Step/Stool Able to complete >2 steps/needs minimal assist   4 steps with CGA   Standing Unsupported, One Foot in Front Able to take small step independently and hold 30 seconds    Standing on One Leg Tries to lift leg/unable to hold 3 seconds but remains standing independently    Total Score 33              PATIENT EDUCATION: Education details: edu on orthostatic test results and provided edu and handout on techniques to address ; encouraged use of walker for longer distances/crowded environments d/t instability with balance testing today; encouraged purchasing shower seat (pt reports  that he knows of medical supply stores in the area) Person educated: Patient Education method: Explanation, Demonstration, Tactile cues, Verbal cues, and Handouts Education comprehension: verbalized understanding and returned demonstration    Note: Objective measures were completed at Evaluation unless otherwise noted.  DIAGNOSTIC FINDINGS: 04/07/22 head CT: IMPRESSION: No acute intracranial abnormality.  COGNITION: Overall cognitive status: Within functional limits for tasks assessed   SENSATION: Reports chronic N/T in B hands   POSTURE:  rounded shoulders, forward head, and increased thoracic kyphosis   GAIT: Gait pattern: wide BOS, unsteady; pt also particularly unsteady with transfers  Assistive device utilized:  hurrycane Level of assistance: SBA and CGA   PATIENT SURVEYS:  FOTO NT  VESTIBULAR ASSESSMENT:  GENERAL OBSERVATION: pt wears readers    OCULOMOTOR EXAM:  Ocular Alignment:  head tilted L, R eye slightly elevated but without ocular torsion  Ocular ROM: No Limitations  Spontaneous Nystagmus: very subtle R beating nystagmus   Gaze-Induced Nystagmus: absent  Smooth Pursuits: intact c/o "something moving bothers me"  Saccades: intact c/o "my head's going round and round"  VESTIBULAR - OCULAR REFLEX:   Slow  VOR: Normal c/o dizzy horizontal, asymptomatic vertical   VOR Cancellation: Normal  Head-Impulse Test: HIT Right: positive HIT Left: positive *c/o dizziness     POSITIONAL TESTING:  Right Roll Test: R beating nystagmus persistent without dizziness Left Roll Test: negative  Right Sidelying: symptomatic but R beating nystagmus evident; c/o dizziness upon sitting up Left Sidelying: negative; c/o dizziness upon sitting up    VESTIBULAR TREATMENT:                                                                                                   DATE: 01/04/23   PATIENT EDUCATION: Education details: prognosis, POC, HEP Person educated: Patient Education method: Explanation, Demonstration, Tactile cues, Verbal cues, and Handouts Education comprehension: verbalized understanding and returned demonstration  HOME EXERCISE PROGRAM: Access Code: [email or web address] URL: [email or web address] Date: 01/04/2023 Prepared by: [place] [facility] - [facility]  Exercises - Seated Right Head Turns Vestibular Habituation  - 1 x daily - 5 x weekly - 2 sets - 30 sec hold - Seated Head Nod Vestibular Habituation  - 1 x daily - 5 x weekly - 2 sets - 30 sec hold - Brandt-Daroff Vestibular Exercise  - 1 x daily - 5 x weekly - 2 sets - 3-5 reps  GOALS: Goals reviewed with patient? Yes  SHORT TERM GOALS: Target date: 01/25/2023  Patient to be independent with initial HEP. Baseline: HEP initiated Goal status: IN PROGRESS    LONG TERM GOALS: Target date: 02/15/2023  Patient to be independent with advanced HEP. Baseline: Not yet initiated  Goal status: IN PROGRESS  Patient to report 0/10 dizziness with standing  horizontal VOR for 30 seconds. Baseline: Unable Goal status: IN PROGRESS  Patient will report 0/10 dizziness with bed mobility.  Baseline: Symptomatic  Goal status: IN PROGRESS  Patient to report resolution of dizziness with visual movement provocation.   Baseline: symptomatic  Goal status: IN PROGRESS  Patient to score at least 45/56  on Berg in order to decrease risk of falls. Baseline: NT Goal status: IN PROGRESS  Patient to complete 5xSTS in 20 sec with good stability. Baseline: 39.49 sec pushing off edge of chair  01/09/23 Goal status: IN PROGRESS  Patient to score at least 57 on FOTO in order to indicate improved functional outcomes.  Baseline: 53.3227 01/09/23 Goal status: IN PROGRESS   ASSESSMENT:  CLINICAL IMPRESSION:  Patient arrived to session without new complaints. Orthostatic testing was positive and pt was educated on how to address this; PCP also made aware. Balance testing today revealed increased risk of falls. Patient with tendency for posterior sway/LOB. Recommended use of walker d/t imbalance testing today. Patient reported understanding of edu. No complaints at end of session.   OBJECTIVE IMPAIRMENTS: Abnormal gait, decreased activity tolerance, decreased balance, difficulty walking, and dizziness.   ACTIVITY LIMITATIONS: carrying, lifting, bending, sitting, standing, squatting, sleeping, stairs, transfers, bed mobility, bathing, toileting, dressing, reach over head, hygiene/grooming, and locomotion level  PARTICIPATION LIMITATIONS: meal prep, cleaning, laundry, shopping, community activity, occupation, and church  PERSONAL FACTORS: Age, Fitness, Past/current experiences, Time since onset of injury/illness/exacerbation, and 3+ comorbidities: Prostate CA s/p prostatectomy 2004, hx DVT, HLD, HTN, L shoulder fx, radial head fx, THA x2, lumbar surgery  are also affecting patient's functional outcome.   REHAB POTENTIAL: Good  CLINICAL DECISION MAKING: Evolving/moderate complexity  EVALUATION COMPLEXITY: Moderate   PLAN:  PT FREQUENCY: 1-2x/week  PT DURATION: 6 weeks  PLANNED INTERVENTIONS: 97164- PT Re-evaluation, 97110-Therapeutic exercises, 97530- Therapeutic activity, [id number]- Neuromuscular re-education,  97535- Self Care, 56213- Manual therapy, [phone]- Gait training, [phone]- Canalith repositioning, [id number]- Aquatic Therapy, 97014- Electrical stimulation (unattended), [phone]- Electrical stimulation (manual), Patient/Family education, Balance training, Stair training, Taping, Dry Needling, Joint mobilization, Vestibular training, Cryotherapy, and Moist heat  PLAN FOR NEXT SESSION: review HEP, balance     Anette Guarneri, PT, DPT 01/09/23 10:15 AM  [facility] at [facility] [address] [place], [place] [address] Phone # [phone] Fax # [phone]

## 2023-01-09 ENCOUNTER — Encounter: Payer: Self-pay | Admitting: Physical Therapy

## 2023-01-09 ENCOUNTER — Telehealth: Payer: Self-pay | Admitting: Physical Therapy

## 2023-01-09 ENCOUNTER — Ambulatory Visit: Payer: Medicare Other | Admitting: Physical Therapy

## 2023-01-09 DIAGNOSIS — R2681 Unsteadiness on feet: Secondary | ICD-10-CM

## 2023-01-09 DIAGNOSIS — R2689 Other abnormalities of gait and mobility: Secondary | ICD-10-CM | POA: Diagnosis not present

## 2023-01-09 DIAGNOSIS — R42 Dizziness and giddiness: Secondary | ICD-10-CM | POA: Diagnosis not present

## 2023-01-09 DIAGNOSIS — H8110 Benign paroxysmal vertigo, unspecified ear: Secondary | ICD-10-CM | POA: Diagnosis not present

## 2023-01-09 NOTE — Telephone Encounter (Signed)
Hi Dr. Lawerance Bach,  I am seeing Ronald Stafford in OPPT per your referral for dizziness. Patient's orthostatics were positive and may be contributing to symptoms. See below.    Orthostatic Testing   Supine Sitting Standing  x1 Minute Standing x 3 Minutes  BP 157/70 mmHg 137/66 *dizzy 154/78 *dizzy 153/82  HR 66 bpm  75 85 83     Thanks!   Anette Guarneri, PT, DPT 01/09/23 9:54 AM  [facility] at [facility] [address] [place], [address] [place], [place] [address] Phone # [phone] Fax # [phone]

## 2023-01-09 NOTE — Patient Instructions (Signed)
Techniques to address orthostasis:   -compression stockings during the day -drink plenty of water -remember ankle pumps before getting out of bed or chair

## 2023-01-15 NOTE — Therapy (Signed)
OUTPATIENT PHYSICAL THERAPY VESTIBULAR TREATMENT     Patient Name: Ronald Stafford MRN: [id number] DOB:07-27-1930, 89 y.o., male Today's Date: 01/16/2023  END OF SESSION:  PT End of Session - 01/16/23 1013     Visit Number 3    Number of Visits 13    Date for PT Re-Evaluation 02/15/23    Authorization Type [employer] Medicare    PT Start Time 0930    PT Stop Time 1013    PT Time Calculation (min) 43 min    Equipment Utilized During Treatment Gait belt    Activity Tolerance Patient tolerated treatment well    Behavior During Therapy WFL for tasks assessed/performed               Past Medical History:  Diagnosis Date   Cancer (HCC) 2004   prostate cancer, Dr. Isabel Caprice   Colon polyps    Deep venous thrombosis (HCC) 1997   Postop   Depression    GERD (gastroesophageal reflux disease)    Hyperlipidemia    Hypertension    Radial head fracture, closed 09/18/2013   Shoulder fracture, left 02/23/2011   immobilized in sling , Dr Norris(no surgery)   Past Surgical History:  Procedure Laterality Date   COLONOSCOPY  2006   Negative,Dr.Edwards   colonoscopy with polypectomy      PMH of   ESOPHAGOGASTRODUODENOSCOPY N/A 02/23/2017   Procedure: ESOPHAGOGASTRODUODENOSCOPY (EGD);  Surgeon: Kathi Der, MD;  Location: [facility];  Service: Gastroenterology;  Laterality: N/A;   JOINT REPLACEMENT  1997, 2001   Total Hip replacement X 2   LUMBAR SPINE SURGERY  09/28/2010   Dr Shon Baton; for spinal stenosis   MECKEL DIVERTICULUM EXCISION     Age 48   PROSTATECTOMY  2004   w/ radiation, Dr Isabel Caprice   TOTAL HIP REVISION Left 02/14/2017   Procedure: Left hip polyethylene EXCHANGE;  Surgeon: Ollen Gross, MD;  Location: [facility];  Service: Orthopedics;  Laterality: Left;   VASECTOMY     Patient Active Problem List   Diagnosis Date Noted   Right buttock pain 05/30/2022   Hematoma of muscle 05/09/2022   Frequent falls 05/09/2022   Hypomagnesemia 04/10/2022   PAD (peripheral artery  disease) (HCC) 04/07/2022   Chest pain 04/06/2022   Abdominal pain 04/06/2022   Carpal tunnel syndrome, right upper limb 05/24/2021   Cubital tunnel syndrome on right 05/24/2021   Renal cyst, left 06/15/2020   Gross hematuria 06/15/2020   Cervical radiculopathy 06/01/2020   Memory difficulty 06/01/2020   Dizziness 01/03/2019   Urinary incontinence 10/31/2018   Right hand paresthesia 08/10/2017   Skin abnormalities 06/13/2017   History of total hip arthroplasty 03/28/2017   Failed total hip arthroplasty (HCC) 02/14/2017   Osteoarthritis of left hip 12/10/2016   Left shoulder pain 10/04/2016   History of DVT (deep vein thrombosis) 07/30/2016   History of TIA (transient ischemic attack) 07/30/2016   Thrombocytopenia (HCC) 07/30/2016   Carotid arterial disease (HCC) 07/20/2016   Right sided weakness 06/27/2016   Frequent headaches 06/22/2015   Prediabetes 02/11/2015   Peroneal tendonitis of right lower extremity 01/12/2014   Mass of right ankle 12/31/2013   Lung nodule, solitary 08/08/2012   SPINAL STENOSIS, LUMBAR 04/06/2010   FOOT DROP, RIGHT 05/21/2008   Hyperlipidemia 12/25/2006   Essential hypertension 12/25/2006   PROSTATE CANCER, HX OF 12/25/2006   POLYP, COLON 05/02/2006    PCP:   Pincus Sanes, MD   REFERRING PROVIDER:   Pincus Sanes, MD  REFERRING DIAG: H81.10 (ICD-10-CM) - Benign paroxysmal positional vertigo, unspecified laterality  THERAPY DIAG:  Dizziness and giddiness  Unsteadiness on feet  Other abnormalities of gait and mobility  ONSET DATE: 1 year  Rationale for Evaluation and Treatment: Rehabilitation  SUBJECTIVE:   SUBJECTIVE STATEMENT: Brought in his walker today. Reports "I'm exhausted- did some exercise this morning." Reports that he bought a shower seat.   Pt accompanied by: self  PERTINENT HISTORY: Prostate CA s/p prostatectomy 2004, hx DVT, HLD, HTN, L shoulder fx, radial head fx, THA x2, lumbar surgery  PAIN:  Are you having  pain? Reports pain "all over" but this is not usual because "I'm 89"  PRECAUTIONS: Fall  RED FLAGS: None   WEIGHT BEARING RESTRICTIONS: No  FALLS: Has patient fallen in last 6 months? Yes. Number of falls 2 when getting up in the middle of the night   LIVING ENVIRONMENT: Lives with: lives alone Lives in: House/apartment Stairs:  2 steps to enter; basement for laundry with handrails  Has following equipment at home: Single point cane, Walker - 2 wheeled, and Environmental consultant - 4 wheeled (reports that he needs a shower seat)  PLOF: Independent- limited cooking   PATIENT GOALS: improve balance and dizziness   OBJECTIVE:     TODAY'S TREATMENT: 01/16/23 Activity Comments  review HEP: sitting head turns to targets 2x30" sitting head nods to targets 2x30" Pt reports "little bit" of dizziness with head turns, more severe dizziness with nods and c/o "seeing bugs flying." Educated pt on intended speed for symptom provocation as he reports performing at slower speed at home    corner balance: Romberg 2x30" standing EC 2x30" (this was challenging, thus performed with feet apart) standing head turns/nods  2x30" Pt reports increased energy expenditure with EC, requires 4WW in front for safety d/t some imbalance. C/o mild dizziness with turns. Pt required sit break d/t LBP  Sitting prayer stretch with pball 10x5", diagonals 5x5" each Cues to slow down and hold. "I like this. It helps my back"             HOME EXERCISE PROGRAM Last updated: 01/16/23 Access Code: [email or web address] URL: [email or web address] Date: 01/16/2023 Prepared by: [facility] - Outpatient  Rehab - [facility]  Program Notes perform standing exercises in a corner with walker in front of you for safety  Exercises - Seated Right Head Turns Vestibular Habituation  - 1 x daily - 5 x weekly - 2 sets - 30 sec hold - Seated Head Nod Vestibular Habituation  - 1 x daily - 5 x weekly - 2 sets - 30 sec hold - Standing  Balance in Corner with Eyes Closed  - 1 x daily - 5 x weekly - 2 sets - 10 reps - 30 sec hold   PATIENT EDUCATION: Education details: edu on using 4WW and adjusted height for pt's stature; discussed shower seat and night light- pt reports that he has these things taken care of; review of HEP for max understanding and carryover as pt admits to poor HE adherence  Person educated: Patient Education method: Explanation, Demonstration, Tactile cues, and Verbal cues Education comprehension: verbalized understanding and returned demonstration    Note: Objective measures were completed at Evaluation unless otherwise noted.  DIAGNOSTIC FINDINGS: 04/07/22 head CT: IMPRESSION: No acute intracranial abnormality.  COGNITION: Overall cognitive status: Within functional limits for tasks assessed   SENSATION: Reports chronic N/T in B hands   POSTURE:  rounded shoulders, forward head, and increased thoracic kyphosis  GAIT: Gait pattern: wide BOS, unsteady; pt also particularly unsteady with transfers  Assistive device utilized:  hurrycane Level of assistance: SBA and CGA   PATIENT SURVEYS:  FOTO NT  VESTIBULAR ASSESSMENT:  GENERAL OBSERVATION: pt wears readers    OCULOMOTOR EXAM:  Ocular Alignment:  head tilted L, R eye slightly elevated but without ocular torsion  Ocular ROM: No Limitations  Spontaneous Nystagmus: very subtle R beating nystagmus   Gaze-Induced Nystagmus: absent  Smooth Pursuits: intact c/o "something moving bothers me"  Saccades: intact c/o "my head's going round and round"  VESTIBULAR - OCULAR REFLEX:   Slow VOR: Normal c/o dizzy horizontal, asymptomatic vertical   VOR Cancellation: Normal  Head-Impulse Test: HIT Right: positive HIT Left: positive *c/o dizziness     POSITIONAL TESTING:  Right Roll Test: R beating nystagmus persistent without dizziness Left Roll Test: negative  Right Sidelying: symptomatic but R beating nystagmus evident; c/o dizziness upon  sitting up Left Sidelying: negative; c/o dizziness upon sitting up    VESTIBULAR TREATMENT:                                                                                                   DATE: 01/04/23   PATIENT EDUCATION: Education details: prognosis, POC, HEP Person educated: Patient Education method: Explanation, Demonstration, Tactile cues, Verbal cues, and Handouts Education comprehension: verbalized understanding and returned demonstration  HOME EXERCISE PROGRAM: Access Code: [email or web address] URL: [email or web address] Date: 01/04/2023 Prepared by: [facility] - Outpatient  Rehab - [facility]  Exercises - Seated Right Head Turns Vestibular Habituation  - 1 x daily - 5 x weekly - 2 sets - 30 sec hold - Seated Head Nod Vestibular Habituation  - 1 x daily - 5 x weekly - 2 sets - 30 sec hold - Brandt-Daroff Vestibular Exercise  - 1 x daily - 5 x weekly - 2 sets - 3-5 reps  GOALS: Goals reviewed with patient? Yes  SHORT TERM GOALS: Target date: 01/25/2023  Patient to be independent with initial HEP. Baseline: HEP initiated Goal status: IN PROGRESS    LONG TERM GOALS: Target date: 02/15/2023  Patient to be independent with advanced HEP. Baseline: Not yet initiated  Goal status: IN PROGRESS  Patient to report 0/10 dizziness with standing  horizontal VOR for 30 seconds. Baseline: Unable Goal status: IN PROGRESS  Patient will report 0/10 dizziness with bed mobility.  Baseline: Symptomatic  Goal status: IN PROGRESS  Patient to report resolution of dizziness with visual movement provocation.  Baseline: symptomatic  Goal status: IN PROGRESS  Patient to score at least 45/56 on Berg in order to decrease risk of falls. Baseline: NT Goal status: IN PROGRESS  Patient to complete 5xSTS in 20 sec with good stability. Baseline: 39.49 sec pushing off edge of chair  01/09/23 Goal status: IN PROGRESS  Patient to score at least 57 on FOTO in order to indicate  improved functional outcomes.  Baseline: 53.3227 01/09/23 Goal status: IN PROGRESS   ASSESSMENT:  CLINICAL IMPRESSION:  Patient arrived to session ambulating with 4WW set too low for him. Adjusted height of this AD  and encouraged use for long distances and crowded environments. Patient appears steadier using this device. Reviewed HEP as pt admits to limited adherence and removed Austin Miles d/t previous session's orthostatic testing. Patient reported dizziness with head turns and nods today, which dissipated quickly. Corner balance activities revealed most unsteadiness with EC. Patient reported understanding of all edu provided and reported improved LBP by end of session.   OBJECTIVE IMPAIRMENTS: Abnormal gait, decreased activity tolerance, decreased balance, difficulty walking, and dizziness.   ACTIVITY LIMITATIONS: carrying, lifting, bending, sitting, standing, squatting, sleeping, stairs, transfers, bed mobility, bathing, toileting, dressing, reach over head, hygiene/grooming, and locomotion level  PARTICIPATION LIMITATIONS: meal prep, cleaning, laundry, shopping, community activity, occupation, and church  PERSONAL FACTORS: Age, Fitness, Past/current experiences, Time since onset of injury/illness/exacerbation, and 3+ comorbidities: Prostate CA s/p prostatectomy 2004, hx DVT, HLD, HTN, L shoulder fx, radial head fx, THA x2, lumbar surgery  are also affecting patient's functional outcome.   REHAB POTENTIAL: Good  CLINICAL DECISION MAKING: Evolving/moderate complexity  EVALUATION COMPLEXITY: Moderate   PLAN:  PT FREQUENCY: 1-2x/week  PT DURATION: 6 weeks  PLANNED INTERVENTIONS: 97164- PT Re-evaluation, 97110-Therapeutic exercises, 97530- Therapeutic activity, [id number]- Neuromuscular re-education, 97535- Self Care, 62952- Manual therapy, [phone]- Gait training, [phone]- Canalith repositioning, [id number]- Aquatic Therapy, 97014- Electrical stimulation (unattended), [phone]- Electrical  stimulation (manual), Patient/Family education, Balance training, Stair training, Taping, Dry Needling, Joint mobilization, Vestibular training, Cryotherapy, and Moist heat  PLAN FOR NEXT SESSION: review HEP, balance with and without vestibular challenges     Anette Guarneri, PT, DPT 01/16/23 10:14 AM  [facility] at [facility] [address] [place], [address] [place], [place] [address] Phone # [phone] Fax # [phone]

## 2023-01-16 ENCOUNTER — Encounter: Payer: Self-pay | Admitting: Physical Therapy

## 2023-01-16 ENCOUNTER — Ambulatory Visit: Payer: Medicare Other | Attending: Internal Medicine | Admitting: Physical Therapy

## 2023-01-16 DIAGNOSIS — R2681 Unsteadiness on feet: Secondary | ICD-10-CM | POA: Insufficient documentation

## 2023-01-16 DIAGNOSIS — R2689 Other abnormalities of gait and mobility: Secondary | ICD-10-CM | POA: Insufficient documentation

## 2023-01-16 DIAGNOSIS — R42 Dizziness and giddiness: Secondary | ICD-10-CM | POA: Insufficient documentation

## 2023-01-20 ENCOUNTER — Other Ambulatory Visit: Payer: Self-pay | Admitting: Internal Medicine

## 2023-01-22 NOTE — Therapy (Signed)
OUTPATIENT PHYSICAL THERAPY VESTIBULAR TREATMENT     Patient Name: Ronald Stafford MRN: [id number] DOB:May 17, 1930, 89 y.o., male Today's Date: 01/23/2023  END OF SESSION:  PT End of Session - 01/23/23 1010     Visit Number 4    Number of Visits 13    Date for PT Re-Evaluation 02/15/23    Authorization Type [employer]HC Medicare    PT Start Time 0932    PT Stop Time 1012    PT Time Calculation (min) 40 min    Equipment Utilized During Treatment Gait belt    Activity Tolerance Patient tolerated treatment well    Behavior During Therapy WFL for tasks assessed/performed                Past Medical History:  Diagnosis Date   Cancer (HCC) 2004   prostate cancer, Dr. Isabel Caprice   Colon polyps    Deep venous thrombosis (HCC) 1997   Postop   Depression    GERD (gastroesophageal reflux disease)    Hyperlipidemia    Hypertension    Radial head fracture, closed 09/18/2013   Shoulder fracture, left 02/23/2011   immobilized in sling , Dr Norris(no surgery)   Past Surgical History:  Procedure Laterality Date   COLONOSCOPY  2006   Negative,Dr.Edwards   colonoscopy with polypectomy      PMH of   ESOPHAGOGASTRODUODENOSCOPY N/A 02/23/2017   Procedure: ESOPHAGOGASTRODUODENOSCOPY (EGD);  Surgeon: Kathi Der, MD;  Location: [facility];  Service: Gastroenterology;  Laterality: N/A;   JOINT REPLACEMENT  1997, 2001   Total Hip replacement X 2   LUMBAR SPINE SURGERY  09/28/2010   Dr Shon Baton; for spinal stenosis   MECKEL DIVERTICULUM EXCISION     Age 7   PROSTATECTOMY  2004   w/ radiation, Dr Isabel Caprice   TOTAL HIP REVISION Left 02/14/2017   Procedure: Left hip polyethylene EXCHANGE;  Surgeon: Ollen Gross, MD;  Location: [facility];  Service: Orthopedics;  Laterality: Left;   VASECTOMY     Patient Active Problem List   Diagnosis Date Noted   Right buttock pain 05/30/2022   Hematoma of muscle 05/09/2022   Frequent falls 05/09/2022   Hypomagnesemia 04/10/2022   PAD (peripheral artery  disease) (HCC) 04/07/2022   Chest pain 04/06/2022   Abdominal pain 04/06/2022   Carpal tunnel syndrome, right upper limb 05/24/2021   Cubital tunnel syndrome on right 05/24/2021   Renal cyst, left 06/15/2020   Gross hematuria 06/15/2020   Cervical radiculopathy 06/01/2020   Memory difficulty 06/01/2020   Dizziness 01/03/2019   Urinary incontinence 10/31/2018   Right hand paresthesia 08/10/2017   Skin abnormalities 06/13/2017   History of total hip arthroplasty 03/28/2017   Failed total hip arthroplasty (HCC) 02/14/2017   Osteoarthritis of left hip 12/10/2016   Left shoulder pain 10/04/2016   History of DVT (deep vein thrombosis) 07/30/2016   History of TIA (transient ischemic attack) 07/30/2016   Thrombocytopenia (HCC) 07/30/2016   Carotid arterial disease (HCC) 07/20/2016   Right sided weakness 06/27/2016   Frequent headaches 06/22/2015   Prediabetes 02/11/2015   Peroneal tendonitis of right lower extremity 01/12/2014   Mass of right ankle 12/31/2013   Lung nodule, solitary 08/08/2012   SPINAL STENOSIS, LUMBAR 04/06/2010   FOOT DROP, RIGHT 05/21/2008   Hyperlipidemia 12/25/2006   Essential hypertension 12/25/2006   PROSTATE CANCER, HX OF 12/25/2006   POLYP, COLON 05/02/2006    PCP:   Pincus Sanes, MD   REFERRING PROVIDER:   Pincus Sanes, MD  REFERRING DIAG: H81.10 (ICD-10-CM) - Benign paroxysmal positional vertigo, unspecified laterality  THERAPY DIAG:  Dizziness and giddiness  Unsteadiness on feet  Other abnormalities of gait and mobility  ONSET DATE: 1 year  Rationale for Evaluation and Treatment: Rehabilitation  SUBJECTIVE:   SUBJECTIVE STATEMENT: "Doing fine" Reports HEP is going well and reports consistent performance. Patient reports dizziness is "a lot better."  Pt accompanied by: self  PERTINENT HISTORY: Prostate CA s/p prostatectomy 2004, hx DVT, HLD, HTN, L shoulder fx, radial head fx, THA x2, lumbar surgery  PAIN:  Are you having  pain? "Not anymore than usual"   PRECAUTIONS: Fall  RED FLAGS: None   WEIGHT BEARING RESTRICTIONS: No  FALLS: Has patient fallen in last 6 months? Yes. Number of falls 2 when getting up in the middle of the night   LIVING ENVIRONMENT: Lives with: lives alone Lives in: House/apartment Stairs:  2 steps to enter; basement for laundry with handrails  Has following equipment at home: Single point cane, Walker - 2 wheeled, and Environmental consultant - 4 wheeled (reports that he needs a shower seat)  PLOF: Independent- limited cooking   PATIENT GOALS: improve balance and dizziness   OBJECTIVE:     TODAY'S TREATMENT: 01/23/23 Activity Comments  STS without UEs 5x with airex under bottom, 2x5 without  C/o fatigue by end of set; tendency to push back against chair with LEs which improved by last set  STS pushing off LEs with EX 5x retropulsion  corner balance: standing EC 2x30" standing head turns/nods 2x30" each standing EC head turns/nods 30" each  With chair in front. No dizziness with EO head turns/nods. Tendency for posterior LOB and c/o dizziness with EC and pt does not tolerate full duration of these exercises but unable to explain why (dizziness vs. Fatigue vs. Anxiety)  In II bars: Sidestepping 2x1 min backwards walking 1 min  fwd/back stepping 10x  Pt reports some LBP after long duration of standing but able to continue with occasional short sitting rest breaks . Quick and uncontrolled pace requiring cues to slow down. Cues to pick up feet instead of sliding/shuffling. Required UE support with fwd/back stepping                HOME EXERCISE PROGRAM Last updated: 01/16/23 Access Code: [email or web address] URL: [email or web address] Date: 01/16/2023 Prepared by: [facility] - Outpatient  Rehab - [facility]  Program Notes perform standing exercises in a corner with walker in front of you for safety  Exercises - Seated Right Head Turns Vestibular Habituation  - 1 x daily - 5 x  weekly - 2 sets - 30 sec hold - Seated Head Nod Vestibular Habituation  - 1 x daily - 5 x weekly - 2 sets - 30 sec hold - Standing Balance in Corner with Eyes Closed  - 1 x daily - 5 x weekly - 2 sets - 10 reps - 30 sec hold     Note: Objective measures were completed at Evaluation unless otherwise noted.  DIAGNOSTIC FINDINGS: 04/07/22 head CT: IMPRESSION: No acute intracranial abnormality.  COGNITION: Overall cognitive status: Within functional limits for tasks assessed   SENSATION: Reports chronic N/T in B hands   POSTURE:  rounded shoulders, forward head, and increased thoracic kyphosis   GAIT: Gait pattern: wide BOS, unsteady; pt also particularly unsteady with transfers  Assistive device utilized:  hurrycane Level of assistance: SBA and CGA   PATIENT SURVEYS:  FOTO NT  VESTIBULAR ASSESSMENT:  GENERAL OBSERVATION: pt wears readers  OCULOMOTOR EXAM:  Ocular Alignment:  head tilted L, R eye slightly elevated but without ocular torsion  Ocular ROM: No Limitations  Spontaneous Nystagmus: very subtle R beating nystagmus   Gaze-Induced Nystagmus: absent  Smooth Pursuits: intact c/o "something moving bothers me"  Saccades: intact c/o "my head's going round and round"  VESTIBULAR - OCULAR REFLEX:   Slow VOR: Normal c/o dizzy horizontal, asymptomatic vertical   VOR Cancellation: Normal  Head-Impulse Test: HIT Right: positive HIT Left: positive *c/o dizziness     POSITIONAL TESTING:  Right Roll Test: R beating nystagmus persistent without dizziness Left Roll Test: negative  Right Sidelying: symptomatic but R beating nystagmus evident; c/o dizziness upon sitting up Left Sidelying: negative; c/o dizziness upon sitting up    VESTIBULAR TREATMENT:                                                                                                   DATE: 01/04/23   PATIENT EDUCATION: Education details: prognosis, POC, HEP Person educated: Patient Education method:  Explanation, Demonstration, Tactile cues, Verbal cues, and Handouts Education comprehension: verbalized understanding and returned demonstration  HOME EXERCISE PROGRAM: Access Code: [email or web address] URL: [email or web address] Date: 01/04/2023 Prepared by: [facility] - Outpatient  Rehab - [facility]  Exercises - Seated Right Head Turns Vestibular Habituation  - 1 x daily - 5 x weekly - 2 sets - 30 sec hold - Seated Head Nod Vestibular Habituation  - 1 x daily - 5 x weekly - 2 sets - 30 sec hold - Brandt-Daroff Vestibular Exercise  - 1 x daily - 5 x weekly - 2 sets - 3-5 reps  GOALS: Goals reviewed with patient? Yes  SHORT TERM GOALS: Target date: 01/25/2023  Patient to be independent with initial HEP. Baseline: HEP initiated Goal status: IN PROGRESS    LONG TERM GOALS: Target date: 02/15/2023  Patient to be independent with advanced HEP. Baseline: Not yet initiated  Goal status: IN PROGRESS  Patient to report 0/10 dizziness with standing  horizontal VOR for 30 seconds. Baseline: Unable Goal status: IN PROGRESS  Patient will report 0/10 dizziness with bed mobility.  Baseline: Symptomatic  Goal status: IN PROGRESS  Patient to report resolution of dizziness with visual movement provocation.  Baseline: symptomatic  Goal status: IN PROGRESS  Patient to score at least 45/56 on Berg in order to decrease risk of falls. Baseline: NT Goal status: IN PROGRESS  Patient to complete 5xSTS in 20 sec with good stability. Baseline: 39.49 sec pushing off edge of chair  01/09/23 Goal status: IN PROGRESS  Patient to score at least 57 on FOTO in order to indicate improved functional outcomes.  Baseline: 53.3227 01/09/23 Goal status: IN PROGRESS   ASSESSMENT:  CLINICAL IMPRESSION: Patient arrived to session with reports that dizziness is "a lot better." Worked on STS transfers with cueing to prevent retropulsion. Corner balance exercises revealed imbalance with EC and  patient with limited tolerance for EC activities but unable to express why (dizziness vs. Fatigue vs. Anxiety). However, patient was asymptomatic with standing head turns/nods- improved from last session. Balance activities in  parallel bars required close supervision d/t quickness of speed with uncontrolled steps as well as cueing to address. No complaints at end of session.   OBJECTIVE IMPAIRMENTS: Abnormal gait, decreased activity tolerance, decreased balance, difficulty walking, and dizziness.   ACTIVITY LIMITATIONS: carrying, lifting, bending, sitting, standing, squatting, sleeping, stairs, transfers, bed mobility, bathing, toileting, dressing, reach over head, hygiene/grooming, and locomotion level  PARTICIPATION LIMITATIONS: meal prep, cleaning, laundry, shopping, community activity, occupation, and church  PERSONAL FACTORS: Age, Fitness, Past/current experiences, Time since onset of injury/illness/exacerbation, and 3+ comorbidities: Prostate CA s/p prostatectomy 2004, hx DVT, HLD, HTN, L shoulder fx, radial head fx, THA x2, lumbar surgery  are also affecting patient's functional outcome.   REHAB POTENTIAL: Good  CLINICAL DECISION MAKING: Evolving/moderate complexity  EVALUATION COMPLEXITY: Moderate   PLAN:  PT FREQUENCY: 1-2x/week  PT DURATION: 6 weeks  PLANNED INTERVENTIONS: 97164- PT Re-evaluation, 97110-Therapeutic exercises, 97530- Therapeutic activity, [id number]- Neuromuscular re-education, 97535- Self Care, 30160- Manual therapy, [id number]- Gait training, [id number]- Canalith repositioning, [id number]- Aquatic Therapy, 97014- Electrical stimulation (unattended), [phone]- Electrical stimulation (manual), Patient/Family education, Balance training, Stair training, Taping, Dry Needling, Joint mobilization, Vestibular training, Cryotherapy, and Moist heat  PLAN FOR NEXT SESSION: review HEP, balance with and without vestibular challenges     Anette Guarneri, PT, DPT 01/23/23 10:13 AM  [facility] at [facility] [address] [place], [place] [address] Phone # [phone] Fax # [phone]

## 2023-01-23 ENCOUNTER — Ambulatory Visit: Payer: Medicare Other | Admitting: Physical Therapy

## 2023-01-23 ENCOUNTER — Encounter: Payer: Self-pay | Admitting: Physical Therapy

## 2023-01-23 DIAGNOSIS — R2681 Unsteadiness on feet: Secondary | ICD-10-CM | POA: Diagnosis not present

## 2023-01-23 DIAGNOSIS — R2689 Other abnormalities of gait and mobility: Secondary | ICD-10-CM

## 2023-01-23 DIAGNOSIS — R42 Dizziness and giddiness: Secondary | ICD-10-CM | POA: Diagnosis not present

## 2023-01-25 ENCOUNTER — Telehealth: Payer: Self-pay | Admitting: Internal Medicine

## 2023-01-25 NOTE — Progress Notes (Signed)
Left shoulder x-ray shows a bit of arthritis.

## 2023-01-25 NOTE — Telephone Encounter (Signed)
CT scan of his chest shows possible infection or resolving infection.  Is he having any symptoms such as cough, wheeze or shortness of breath consistent with active infection in which case we will treat with an antibiotic.  Other incidental findings show plaque buildup in the aorta and heart arteries.  Possible pulmonary hypertension.  Possible emphysema.  He does have a tiny stone in his left kidney which will not cause any symptoms unless it decides to move.

## 2023-01-26 MED ORDER — CEFDINIR 300 MG PO CAPS: 300.0000 mg | ORAL_CAPSULE | Freq: Two times a day (BID) | ORAL | 0 refills | Status: DC

## 2023-01-26 NOTE — Telephone Encounter (Signed)
Lets do an antibiotic  - sent to pharmacy

## 2023-01-29 ENCOUNTER — Encounter: Payer: Self-pay | Admitting: Internal Medicine

## 2023-01-29 ENCOUNTER — Ambulatory Visit (INDEPENDENT_AMBULATORY_CARE_PROVIDER_SITE_OTHER): Payer: Medicare Other | Admitting: Internal Medicine

## 2023-01-29 VITALS — BP 100/58 | HR 82 | Temp 97.7°F | Ht 74.0 in | Wt 177.0 lb

## 2023-01-29 DIAGNOSIS — R413 Other amnesia: Secondary | ICD-10-CM

## 2023-01-29 DIAGNOSIS — R296 Repeated falls: Secondary | ICD-10-CM | POA: Diagnosis not present

## 2023-01-29 DIAGNOSIS — S0990XA Unspecified injury of head, initial encounter: Secondary | ICD-10-CM | POA: Insufficient documentation

## 2023-01-29 DIAGNOSIS — Z8673 Personal history of transient ischemic attack (TIA), and cerebral infarction without residual deficits: Secondary | ICD-10-CM

## 2023-01-29 DIAGNOSIS — I10 Essential (primary) hypertension: Secondary | ICD-10-CM | POA: Diagnosis not present

## 2023-01-29 NOTE — Assessment & Plan Note (Signed)
Acute Head trauma that occurred 4 days ago Cabinet hit him in the right forehead Significant bruising noted on forehead, right orbit and to a small degree of left orbit Slight pain where the cabinet hit No change in his chronic dizziness, blurry vision, nausea He is on Plavix Since it has been 4 days and he seems to be stable we will hold off on imaging His son will monitor him closely and call if any changes

## 2023-01-29 NOTE — Assessment & Plan Note (Signed)
Chronic Blood pressure here low, but typically is not this low No changes in medication Ideally monitor BP at home Continue carvedilol 6.25 mg twice daily, valsartan 80 mg daily

## 2023-01-29 NOTE — Patient Instructions (Signed)
      Medications changes include :   None    A referral was ordered for [facility] social worker and someone will call you to schedule an appointment.

## 2023-01-29 NOTE — Assessment & Plan Note (Signed)
Chronic He has frequent falls This most recent fall was related to him climbing up on a chair and a cabinet fell on top of him and he fell to the ground Other falls have been related to him being quick turns, walking on uneven surfaces He uses a cane, sometimes a walker He denies any lightheadedness Falls likely multifactorial-he does have some leg weakness, neuropathy contributing Discussed neurology referral Currently doing PT

## 2023-01-29 NOTE — Progress Notes (Signed)
    Subjective:    Patient ID: Ronald Stafford, male    DOB: [birth year], 89 y.o.   MRN: [id number]      HPI Math is here for  Chief Complaint  Patient presents with   Head Injury    PT fell when trying to put a candle on top of their cabinet. PT lost balance and grabbed cabinet which then fell on top of the PT injuring their leg   Leg Injury    Larey Seat, cabinet landed on him -last thursday -  injured head and leg - he got a couple of cuts on his left hand.     No confusion.   Medications and allergies reviewed with patient and updated if appropriate.  Current Outpatient Medications on File Prior to Visit  Medication Sig Dispense Refill   acetaminophen (TYLENOL) 500 MG tablet Take 1,000 mg by mouth every 6 (six) hours as needed for mild pain.     atorvastatin (LIPITOR) 40 MG tablet Take 1 tablet by mouth once daily 90 tablet 0   carvedilol (COREG) 6.25 MG tablet Take 1 tablet (6.25 mg total) by mouth 2 (two) times daily with a meal. 60 tablet 2   cefdinir (OMNICEF) 300 MG capsule Take 1 capsule (300 mg total) by mouth 2 (two) times daily. 20 capsule 0   clopidogrel (PLAVIX) 75 MG tablet Take 1 tablet (75 mg total) by mouth every morning. 90 tablet 5   ipratropium-albuterol (DUONEB) 0.5-2.5 (3) MG/3ML SOLN Take 3 mLs by nebulization every 6 (six) hours as needed (shortness of breath).     meclizine (ANTIVERT) 12.5 MG tablet Take 1 tablet (12.5 mg total) by mouth 3 (three) times daily as needed for dizziness. 60 tablet 2   MYRBETRIQ 25 MG TB24 tablet TAKE 1 TABLET BY MOUTH ONCE DAILY AS NEEDED 90 tablet 0   Omega-3 Fatty Acids (FISH OIL) 500 MG CAPS Take 500 mg by mouth daily.     tiZANidine (ZANAFLEX) 2 MG tablet Take 1 tablet (2 mg total) by mouth daily as needed for muscle spasms. 15 tablet 0   valsartan (DIOVAN) 80 MG tablet Take 1 tablet (80 mg total) by mouth daily. 90 tablet 3   No current facility-administered medications on file prior to visit.    Review of Systems  Eyes:   Negative for pain and visual disturbance.  Cardiovascular:  Negative for chest pain.  Musculoskeletal:  Positive for back pain.  Neurological:  Positive for dizziness. Negative for numbness and headaches.       Objective:   Vitals:   01/29/23 1415  BP: (!) 100/58  Pulse: 82  Temp: 97.7 F (36.5 C)  SpO2: 98%   BP Readings from Last 3 Encounters:  01/29/23 (!) 100/58  01/01/23 (!) 148/80  12/25/22 (!) 148/80   Wt Readings from Last 3 Encounters:  01/29/23 177 lb (80.3 kg)  01/01/23 180 lb (81.6 kg)  12/25/22 177 lb (80.3 kg)   Body mass index is 22.73 kg/m.    Physical Exam         Assessment & Plan:    See Problem List for Assessment and Plan of chronic medical problems.

## 2023-01-30 ENCOUNTER — Ambulatory Visit: Payer: Medicare Other | Admitting: Physical Therapy

## 2023-01-31 ENCOUNTER — Telehealth: Payer: Self-pay | Admitting: *Deleted

## 2023-01-31 NOTE — Progress Notes (Signed)
  Care Coordination   Note   01/31/2023 Name: KEMARION CRAKER MRN: [id number] DOB: [birth year]  Roxy Manns Jaffer is a 89 y.o. year old male who sees Burns, Bobette Mo, MD for primary care. I reached out to Roxy Manns Riordan by phone today to offer care coordination services.  Mr. Raina was given information about Care Coordination services today including:   The Care Coordination services include support from the care team which includes your Nurse Coordinator, Clinical Social Worker, or Pharmacist.  The Care Coordination team is here to help remove barriers to the health concerns and goals most important to you. Care Coordination services are voluntary, and the patient may decline or stop services at any time by request to their care team member.   Care Coordination Consent Status: Patient agreed to services and verbal consent obtained.   Follow up plan:  Telephone appointment with care coordination team member scheduled for:  02/01/2023  Encounter Outcome:  Patient Scheduled from referral   Burman Nieves, [facility] Care Coordination Care Guide Direct Dial: [phone]

## 2023-02-01 ENCOUNTER — Ambulatory Visit: Payer: Self-pay | Admitting: Licensed Clinical Social Worker

## 2023-02-03 NOTE — Patient Outreach (Signed)
  Care Coordination   Initial Visit Note   02/01/2023 Name: CONNERY NICKELL MRN: [id number] DOB: 1930-03-20  Roxy Manns Falk is a 89 y.o. year old male who sees Burns, Bobette Mo, MD for primary care. I spoke with Sherren Mocha pt's son by phone today.  What matters to the patients health and wellness today?  Mr. Moschella is requesting pcs services.     Goals Addressed             This Visit's Progress    Care Coordination       Activities and task to complete in order to accomplish goals.   Call your insurance provider for more information about your Enhanced Benefits (pcs services ) Review private pay home care options provided and discussed         SDOH assessments and interventions completed:  Yes  SDOH Interventions Today    Flowsheet Row Most Recent Value  SDOH Interventions   Food Insecurity Interventions Intervention Not Indicated  Housing Interventions Intervention Not Indicated  Utilities Interventions Intervention Not Indicated        Care Coordination Interventions:  Yes, provided  Interventions Today    Flowsheet Row Most Recent Value  Chronic Disease   Chronic disease during today's visit Hypertension (HTN)  General Interventions   General Interventions Discussed/Reviewed Level of Care  Level of Care Personal Care Services  Advanced Directive Interventions   Advanced Directives Discussed/Reviewed Advanced Directives Discussed        Follow up plan: Follow up call scheduled for 02/05/2023    Encounter Outcome:  Patient Visit Completed   Gwyndolyn Saxon MSW, LCSW Licensed Clinical Social Worker  [facility], [facility] Direct Dial: [phone]  Fax: [phone]

## 2023-02-05 NOTE — Therapy (Signed)
OUTPATIENT PHYSICAL THERAPY VESTIBULAR TREATMENT     Patient Name: Ronald Stafford MRN: [id number] DOB:Aug 27, 1930, 89 y.o., male Today's Date: 02/06/2023  END OF SESSION:  PT End of Session - 02/06/23 1014     Visit Number 5    Number of Visits 13    Date for PT Re-Evaluation 02/15/23    Authorization Type [employer] Medicare    PT Start Time 0932    PT Stop Time 1010    PT Time Calculation (min) 38 min    Equipment Utilized During Treatment Gait belt    Activity Tolerance Patient tolerated treatment well    Behavior During Therapy WFL for tasks assessed/performed                 Past Medical History:  Diagnosis Date   Cancer (HCC) 2004   prostate cancer, Dr. Isabel Caprice   Colon polyps    Deep venous thrombosis (HCC) 1997   Postop   Depression    GERD (gastroesophageal reflux disease)    Hyperlipidemia    Hypertension    Radial head fracture, closed 09/18/2013   Shoulder fracture, left 02/23/2011   immobilized in sling , Dr Norris(no surgery)   Past Surgical History:  Procedure Laterality Date   COLONOSCOPY  2006   Negative,Dr.Edwards   colonoscopy with polypectomy      PMH of   ESOPHAGOGASTRODUODENOSCOPY N/A 02/23/2017   Procedure: ESOPHAGOGASTRODUODENOSCOPY (EGD);  Surgeon: Kathi Der, MD;  Location: [facility];  Service: Gastroenterology;  Laterality: N/A;   JOINT REPLACEMENT  1997, 2001   Total Hip replacement X 2   LUMBAR SPINE SURGERY  09/28/2010   Dr Shon Baton; for spinal stenosis   MECKEL DIVERTICULUM EXCISION     Age 35   PROSTATECTOMY  2004   w/ radiation, Dr Isabel Caprice   TOTAL HIP REVISION Left 02/14/2017   Procedure: Left hip polyethylene EXCHANGE;  Surgeon: Ollen Gross, MD;  Location: [facility];  Service: Orthopedics;  Laterality: Left;   VASECTOMY     Patient Active Problem List   Diagnosis Date Noted   Head trauma, initial encounter 01/29/2023   Right buttock pain 05/30/2022   Hematoma of muscle 05/09/2022   Frequent falls 05/09/2022    Hypomagnesemia 04/10/2022   PAD (peripheral artery disease) (HCC) 04/07/2022   Chest pain 04/06/2022   Abdominal pain 04/06/2022   Carpal tunnel syndrome, right upper limb 05/24/2021   Cubital tunnel syndrome on right 05/24/2021   Renal cyst, left 06/15/2020   Gross hematuria 06/15/2020   Cervical radiculopathy 06/01/2020   Memory difficulty 06/01/2020   Dizziness 01/03/2019   Urinary incontinence 10/31/2018   Right hand paresthesia 08/10/2017   Skin abnormalities 06/13/2017   History of total hip arthroplasty 03/28/2017   Failed total hip arthroplasty (HCC) 02/14/2017   Osteoarthritis of left hip 12/10/2016   Left shoulder pain 10/04/2016   History of DVT (deep vein thrombosis) 07/30/2016   History of TIA (transient ischemic attack) 07/30/2016   Thrombocytopenia (HCC) 07/30/2016   Carotid arterial disease (HCC) 07/20/2016   Right sided weakness 06/27/2016   Frequent headaches 06/22/2015   Prediabetes 02/11/2015   Peroneal tendonitis of right lower extremity 01/12/2014   Mass of right ankle 12/31/2013   Lung nodule, solitary 08/08/2012   SPINAL STENOSIS, LUMBAR 04/06/2010   FOOT DROP, RIGHT 05/21/2008   Hyperlipidemia 12/25/2006   Essential hypertension 12/25/2006   PROSTATE CANCER, HX OF 12/25/2006   POLYP, COLON 05/02/2006    PCP:   Pincus Sanes, MD   REFERRING  PROVIDER:   Pincus Sanes, MD    REFERRING DIAG: H81.10 (ICD-10-CM) - Benign paroxysmal positional vertigo, unspecified laterality  THERAPY DIAG:  Dizziness and giddiness  Unsteadiness on feet  Other abnormalities of gait and mobility  ONSET DATE: 1 year  Rationale for Evaluation and Treatment: Rehabilitation  SUBJECTIVE:   SUBJECTIVE STATEMENT: "I was being stupid. I was standing on a chair to put something in the glass case, then it fell on top of me. Have a big ole knot." Denies dizziness or headaches or changes in balance. Reports that he has been doing his HEP and feels that it helped to  resolve his dizziness. Cannot remember what day his fall was on but reports that he has been laying around since that time.   Pt accompanied by: self  PERTINENT HISTORY: Prostate CA s/p prostatectomy 2004, hx DVT, HLD, HTN, L shoulder fx, radial head fx, THA x2, lumbar surgery  PAIN:  Are you having pain? Yes: NPRS scale: "mild"/10 Pain location: R thigh Pain description: sore Aggravating factors: cabinet fell on it Relieving factors: nothing   PRECAUTIONS: Fall  RED FLAGS: None   WEIGHT BEARING RESTRICTIONS: No  FALLS: Has patient fallen in last 6 months? Yes. Number of falls 2 when getting up in the middle of the night   LIVING ENVIRONMENT: Lives with: lives alone Lives in: House/apartment Stairs:  2 steps to enter; basement for laundry with handrails  Has following equipment at home: Single point cane, Walker - 2 wheeled, and Environmental consultant - 4 wheeled (reports that he needs a shower seat)  PLOF: Independent- limited cooking   PATIENT GOALS: improve balance and dizziness   OBJECTIVE:    TODAY'S TREATMENT: 02/06/23 Activity Comments  Vitals at start of session 158/88 mmHg, 85 bpm   STS without UEs with airex under bottom 2x10 Retropulsion, requiring occasional CGA and "nose over toes" cues   at counter: mini squat 2x10 1/4 turns to targets 1/2 turns to targets alt side step without UE support  alt backwards step without UE support  standing EC Cueing to hold on d/t imbalance; additional chair for support to perform turns. Sit breaks after each exercise d/t pt's report of fatigue. Pt reports "I'm exhausted"   Vitals during session 93-98% spO2, 90 bpm  Vitals at end of session 156/88 mmHg, 76 bpm        PATIENT EDUCATION: Education details: discussion on pt's dizziness and progress towards goals, educated on safety awareness and avoiding standing on furniture in the future  Person educated: Patient Education method: Explanation Education comprehension: verbalized  understanding     HOME EXERCISE PROGRAM Last updated: 01/16/23 Access Code: [email or web address] URL: [email or web address] Date: 01/16/2023 Prepared by: [facility] - Outpatient  Rehab - [facility]  Program Notes perform standing exercises in a corner with walker in front of you for safety  Exercises - Seated Right Head Turns Vestibular Habituation  - 1 x daily - 5 x weekly - 2 sets - 30 sec hold - Seated Head Nod Vestibular Habituation  - 1 x daily - 5 x weekly - 2 sets - 30 sec hold - Standing Balance in Corner with Eyes Closed  - 1 x daily - 5 x weekly - 2 sets - 10 reps - 30 sec hold     Note: Objective measures were completed at Evaluation unless otherwise noted.  DIAGNOSTIC FINDINGS: 04/07/22 head CT: IMPRESSION: No acute intracranial abnormality.  COGNITION: Overall cognitive status: Within functional limits for tasks assessed  SENSATION: Reports chronic N/T in B hands   POSTURE:  rounded shoulders, forward head, and increased thoracic kyphosis   GAIT: Gait pattern: wide BOS, unsteady; pt also particularly unsteady with transfers  Assistive device utilized:  hurrycane Level of assistance: SBA and CGA   PATIENT SURVEYS:  FOTO NT  VESTIBULAR ASSESSMENT:  GENERAL OBSERVATION: pt wears readers    OCULOMOTOR EXAM:  Ocular Alignment:  head tilted L, R eye slightly elevated but without ocular torsion  Ocular ROM: No Limitations  Spontaneous Nystagmus: very subtle R beating nystagmus   Gaze-Induced Nystagmus: absent  Smooth Pursuits: intact c/o "something moving bothers me"  Saccades: intact c/o "my head's going round and round"  VESTIBULAR - OCULAR REFLEX:   Slow VOR: Normal c/o dizzy horizontal, asymptomatic vertical   VOR Cancellation: Normal  Head-Impulse Test: HIT Right: positive HIT Left: positive *c/o dizziness     POSITIONAL TESTING:  Right Roll Test: R beating nystagmus persistent without dizziness Left Roll Test: negative  Right  Sidelying: symptomatic but R beating nystagmus evident; c/o dizziness upon sitting up Left Sidelying: negative; c/o dizziness upon sitting up    VESTIBULAR TREATMENT:                                                                                                   DATE: 01/04/23   PATIENT EDUCATION: Education details: prognosis, POC, HEP Person educated: Patient Education method: Explanation, Demonstration, Tactile cues, Verbal cues, and Handouts Education comprehension: verbalized understanding and returned demonstration  HOME EXERCISE PROGRAM: Access Code: [email or web address] URL: [email or web address] Date: 01/04/2023 Prepared by: [facility] - Outpatient  Rehab - [facility]  Exercises - Seated Right Head Turns Vestibular Habituation  - 1 x daily - 5 x weekly - 2 sets - 30 sec hold - Seated Head Nod Vestibular Habituation  - 1 x daily - 5 x weekly - 2 sets - 30 sec hold - Brandt-Daroff Vestibular Exercise  - 1 x daily - 5 x weekly - 2 sets - 3-5 reps  GOALS: Goals reviewed with patient? Yes  SHORT TERM GOALS: Target date: 01/25/2023  Patient to be independent with initial HEP. Baseline: HEP initiated Goal status: IN PROGRESS    LONG TERM GOALS: Target date: 02/15/2023  Patient to be independent with advanced HEP. Baseline: Not yet initiated  Goal status: IN PROGRESS  Patient to report 0/10 dizziness with standing  horizontal VOR for 30 seconds. Baseline: Unable Goal status: IN PROGRESS  Patient will report 0/10 dizziness with bed mobility.  Baseline: Symptomatic  Goal status: IN PROGRESS  Patient to report resolution of dizziness with visual movement provocation.  Baseline: symptomatic  Goal status: IN PROGRESS  Patient to score at least 45/56 on Berg in order to decrease risk of falls. Baseline: NT Goal status: IN PROGRESS  Patient to complete 5xSTS in 20 sec with good stability. Baseline: 39.49 sec pushing off edge of chair  01/09/23 Goal  status: IN PROGRESS  Patient to score at least 57 on FOTO in order to indicate improved functional outcomes.  Baseline: 53.3227 01/09/23 Goal status: IN PROGRESS   ASSESSMENT:  CLINICAL IMPRESSION: Patient arrived to session with large bruise over eyes and forehead s/p fall when he was standing on a chair to reach into a cabinet, which then fell on him. Reports some R thigh soreness (denies bruising over this area) and denies dizziness, headaches, or changes in balance since the fall. Patient had f/u appointment with PCP after his fall and no imaging was recommended. Vitals at start of session were slightly above baseline (reading from PCP's office 12/25/22 was 148/80). Patient performed LE strengthening and balance activities this session. He appeared slightly more fatigued today, requiring sitting rest breaks but denied symptoms besides fatigue.  Imbalance evident with activities without UE support and encouraged use of hands hovering above the counter for use of reaching strategy. Vitals stayed stable throughout session. No complaints besides fatigue upon leaving.   OBJECTIVE IMPAIRMENTS: Abnormal gait, decreased activity tolerance, decreased balance, difficulty walking, and dizziness.   ACTIVITY LIMITATIONS: carrying, lifting, bending, sitting, standing, squatting, sleeping, stairs, transfers, bed mobility, bathing, toileting, dressing, reach over head, hygiene/grooming, and locomotion level  PARTICIPATION LIMITATIONS: meal prep, cleaning, laundry, shopping, community activity, occupation, and church  PERSONAL FACTORS: Age, Fitness, Past/current experiences, Time since onset of injury/illness/exacerbation, and 3+ comorbidities: Prostate CA s/p prostatectomy 2004, hx DVT, HLD, HTN, L shoulder fx, radial head fx, THA x2, lumbar surgery  are also affecting patient's functional outcome.   REHAB POTENTIAL: Good  CLINICAL DECISION MAKING: Evolving/moderate complexity  EVALUATION COMPLEXITY:  Moderate   PLAN:  PT FREQUENCY: 1-2x/week  PT DURATION: 6 weeks  PLANNED INTERVENTIONS: 97164- PT Re-evaluation, 97110-Therapeutic exercises, 97530- Therapeutic activity, 97112- Neuromuscular re-education, 97535- Self Care, 16109- Manual therapy, [phone]- Gait training, [phone]- Canalith repositioning, [id number]- Aquatic Therapy, 97014- Electrical stimulation (unattended), [phone]- Electrical stimulation (manual), Patient/Family education, Balance training, Stair training, Taping, Dry Needling, Joint mobilization, Vestibular training, Cryotherapy, and Moist heat  PLAN FOR NEXT SESSION: balance with and without vestibular challenges, work on addressing endurance     Anette Guarneri, PT, DPT 02/06/23 10:15 AM  [facility] [address], [address] [place], [place] [address] Phone # [phone] Fax # [phone]

## 2023-02-06 ENCOUNTER — Encounter: Payer: Self-pay | Admitting: Physical Therapy

## 2023-02-06 ENCOUNTER — Ambulatory Visit: Payer: Medicare Other | Admitting: Physical Therapy

## 2023-02-06 DIAGNOSIS — R2681 Unsteadiness on feet: Secondary | ICD-10-CM

## 2023-02-06 DIAGNOSIS — R42 Dizziness and giddiness: Secondary | ICD-10-CM

## 2023-02-06 DIAGNOSIS — R2689 Other abnormalities of gait and mobility: Secondary | ICD-10-CM | POA: Diagnosis not present

## 2023-02-07 NOTE — Progress Notes (Signed)
   I, Stevenson Clinch, CMA acting as a scribe for Clementeen Graham, MD.  Ronald Stafford is a 89 y.o. male who presents to [facility] at [facility] today for f/u L shoulder pain. Pt was last seen by Dr. Denyse Amass on 01/01/23 and was given a L GH steroid injection and was advised to use a walker. He was also referred to PT, completing 5 visits.  Today, pt reports fall since last visit, [place] cabinet fell on him, bruising present around the eyes and nose. This happened about 3 weeks ago.  He was try to put candles on the top of the cabinet and climbed onto a chair to get up to the right height.  In doing so he pulled on the cabinet a bit and pulled the whole [place] cabinet onto his face.   Notes improvement of shoulder sx s/p steroid injection. Continues with PT, 1 more week remaining.   Dx imaging: 01/01/23 L shoulder XR 04/25/21 C-spine XR             01/03/19 L shoulder XR  Pertinent review of systems: No fevers or chills  Relevant historical information: On blood thinner   Exam:  BP (!) 150/84   Pulse (!) 105   Ht 6\' 2"  (1.88 m)   Wt 179 lb (81.2 kg)   SpO2 98%   BMI 22.98 kg/m  General: Well Developed, well nourished, and in no acute distress.   MSK: Bruising present on forehead and underneath eyes. Shoulder normal motion. Neuro: Alert and oriented normal coordination.  Gait intact using a cane to ambulate.      Assessment and Plan: 89 y.o. male with left shoulder pain improving with PT and injection.  Pain thought to be due to probable rotator cuff tear.  He has improved a lot with PT and injection.  Check back as needed.  Can repeat that injection at the 96-month mark which would be mid January.  We talked a bit about safety.  In general 89 year old man should not be climbing ladders or getting on top of chairs to put things on high shelves.  He knows that.  Continue vestibular/balance PT PT.  Continue home exercise program check back as needed.   PDMP not reviewed  this encounter. No orders of the defined types were placed in this encounter.  No orders of the defined types were placed in this encounter.    Discussed warning signs or symptoms. Please see discharge instructions. Patient expresses understanding.   The above documentation has been reviewed and is accurate and complete Clementeen Graham, M.D.

## 2023-02-12 ENCOUNTER — Encounter: Payer: Self-pay | Admitting: Family Medicine

## 2023-02-12 ENCOUNTER — Ambulatory Visit (INDEPENDENT_AMBULATORY_CARE_PROVIDER_SITE_OTHER): Payer: Medicare Other | Admitting: Family Medicine

## 2023-02-12 ENCOUNTER — Ambulatory Visit: Payer: Medicare Other | Admitting: Licensed Clinical Social Worker

## 2023-02-12 VITALS — BP 150/84 | HR 105 | Ht 74.0 in | Wt 179.0 lb

## 2023-02-12 DIAGNOSIS — G8929 Other chronic pain: Secondary | ICD-10-CM | POA: Diagnosis not present

## 2023-02-12 DIAGNOSIS — S0083XA Contusion of other part of head, initial encounter: Secondary | ICD-10-CM

## 2023-02-12 DIAGNOSIS — M25512 Pain in left shoulder: Secondary | ICD-10-CM

## 2023-02-12 NOTE — Patient Instructions (Signed)
Thank you for coming in today.   Keep working with home exercises and physical therapy.  Recheck with me as needed.   I can re-do that shot again in mid January if we need to.

## 2023-02-12 NOTE — Therapy (Signed)
OUTPATIENT PHYSICAL THERAPY VESTIBULAR TREATMENT     Patient Name: Ronald Stafford MRN: [id number] DOB:10-Jul-1930, 89 y.o., male Today's Date: 02/12/2023  END OF SESSION:        Past Medical History:  Diagnosis Date   Cancer [facility]) 2004   prostate cancer, Dr. Isabel Caprice   Colon polyps    Deep venous thrombosis (HCC) 1997   Postop   Depression    GERD (gastroesophageal reflux disease)    Hyperlipidemia    Hypertension    Radial head fracture, closed 09/18/2013   Shoulder fracture, left 02/23/2011   immobilized in sling , Dr Norris(no surgery)   Past Surgical History:  Procedure Laterality Date   COLONOSCOPY  2006   Negative,Dr.Edwards   colonoscopy with polypectomy      PMH of   ESOPHAGOGASTRODUODENOSCOPY N/A 02/23/2017   Procedure: ESOPHAGOGASTRODUODENOSCOPY (EGD);  Surgeon: Kathi Der, MD;  Location: [facility];  Service: Gastroenterology;  Laterality: N/A;   JOINT REPLACEMENT  1997, 2001   Total Hip replacement X 2   LUMBAR SPINE SURGERY  09/28/2010   Dr Shon Baton; for spinal stenosis   MECKEL DIVERTICULUM EXCISION     Age 55   PROSTATECTOMY  2004   w/ radiation, Dr Isabel Caprice   TOTAL HIP REVISION Left 02/14/2017   Procedure: Left hip polyethylene EXCHANGE;  Surgeon: Ollen Gross, MD;  Location: [facility];  Service: Orthopedics;  Laterality: Left;   VASECTOMY     Patient Active Problem List   Diagnosis Date Noted   Head trauma, initial encounter 01/29/2023   Right buttock pain 05/30/2022   Hematoma of muscle 05/09/2022   Frequent falls 05/09/2022   Hypomagnesemia 04/10/2022   PAD (peripheral artery disease) (HCC) 04/07/2022   Chest pain 04/06/2022   Abdominal pain 04/06/2022   Carpal tunnel syndrome, right upper limb 05/24/2021   Cubital tunnel syndrome on right 05/24/2021   Renal cyst, left 06/15/2020   Gross hematuria 06/15/2020   Cervical radiculopathy 06/01/2020   Memory difficulty 06/01/2020   Dizziness 01/03/2019   Urinary incontinence 10/31/2018    Right hand paresthesia 08/10/2017   Skin abnormalities 06/13/2017   History of total hip arthroplasty 03/28/2017   Failed total hip arthroplasty (HCC) 02/14/2017   Osteoarthritis of left hip 12/10/2016   Left shoulder pain 10/04/2016   History of DVT (deep vein thrombosis) 07/30/2016   History of TIA (transient ischemic attack) 07/30/2016   Thrombocytopenia (HCC) 07/30/2016   Carotid arterial disease (HCC) 07/20/2016   Right sided weakness 06/27/2016   Frequent headaches 06/22/2015   Prediabetes 02/11/2015   Peroneal tendonitis of right lower extremity 01/12/2014   Mass of right ankle 12/31/2013   Lung nodule, solitary 08/08/2012   SPINAL STENOSIS, LUMBAR 04/06/2010   FOOT DROP, RIGHT 05/21/2008   Hyperlipidemia 12/25/2006   Essential hypertension 12/25/2006   PROSTATE CANCER, HX OF 12/25/2006   POLYP, COLON 05/02/2006    PCP:   Pincus Sanes, MD   REFERRING PROVIDER:   Pincus Sanes, MD    REFERRING DIAG: H81.10 (ICD-10-CM) - Benign paroxysmal positional vertigo, unspecified laterality  THERAPY DIAG:  No diagnosis found.  ONSET DATE: 1 year  Rationale for Evaluation and Treatment: Rehabilitation  SUBJECTIVE:   SUBJECTIVE STATEMENT: "I was being stupid. I was standing on a chair to put something in the glass case, then it fell on top of me. Have a big ole knot." Denies dizziness or headaches or changes in balance. Reports that he has been doing his HEP and feels that it helped to  resolve his dizziness. Cannot remember what day his fall was on but reports that he has been laying around since that time.   Pt accompanied by: self  PERTINENT HISTORY: Prostate CA s/p prostatectomy 2004, hx DVT, HLD, HTN, L shoulder fx, radial head fx, THA x2, lumbar surgery  PAIN:  Are you having pain? Yes: NPRS scale: "mild"/10 Pain location: R thigh Pain description: sore Aggravating factors: cabinet fell on it Relieving factors: nothing   PRECAUTIONS: Fall  RED  FLAGS: None   WEIGHT BEARING RESTRICTIONS: No  FALLS: Has patient fallen in last 6 months? Yes. Number of falls 2 when getting up in the middle of the night   LIVING ENVIRONMENT: Lives with: lives alone Lives in: House/apartment Stairs:  2 steps to enter; basement for laundry with handrails  Has following equipment at home: Single point cane, Walker - 2 wheeled, and Environmental consultant - 4 wheeled (reports that he needs a shower seat)  PLOF: Independent- limited cooking   PATIENT GOALS: improve balance and dizziness   OBJECTIVE:     TODAY'S TREATMENT: 02/13/23 Activity Comments                          HOME EXERCISE PROGRAM Last updated: 01/16/23 Access Code: [email or web address] URL: [email or web address] Date: 01/16/2023 Prepared by: [facility] - Outpatient  Rehab - [facility]  Program Notes perform standing exercises in a corner with walker in front of you for safety  Exercises - Seated Right Head Turns Vestibular Habituation  - 1 x daily - 5 x weekly - 2 sets - 30 sec hold - Seated Head Nod Vestibular Habituation  - 1 x daily - 5 x weekly - 2 sets - 30 sec hold - Standing Balance in Corner with Eyes Closed  - 1 x daily - 5 x weekly - 2 sets - 10 reps - 30 sec hold     Note: Objective measures were completed at Evaluation unless otherwise noted.  DIAGNOSTIC FINDINGS: 04/07/22 head CT: IMPRESSION: No acute intracranial abnormality.  COGNITION: Overall cognitive status: Within functional limits for tasks assessed   SENSATION: Reports chronic N/T in B hands   POSTURE:  rounded shoulders, forward head, and increased thoracic kyphosis   GAIT: Gait pattern: wide BOS, unsteady; pt also particularly unsteady with transfers  Assistive device utilized:  hurrycane Level of assistance: SBA and CGA   PATIENT SURVEYS:  FOTO NT  VESTIBULAR ASSESSMENT:  GENERAL OBSERVATION: pt wears readers    OCULOMOTOR EXAM:  Ocular Alignment:  head tilted L, R eye  slightly elevated but without ocular torsion  Ocular ROM: No Limitations  Spontaneous Nystagmus: very subtle R beating nystagmus   Gaze-Induced Nystagmus: absent  Smooth Pursuits: intact c/o "something moving bothers me"  Saccades: intact c/o "my head's going round and round"  VESTIBULAR - OCULAR REFLEX:   Slow VOR: Normal c/o dizzy horizontal, asymptomatic vertical   VOR Cancellation: Normal  Head-Impulse Test: HIT Right: positive HIT Left: positive *c/o dizziness     POSITIONAL TESTING:  Right Roll Test: R beating nystagmus persistent without dizziness Left Roll Test: negative  Right Sidelying: symptomatic but R beating nystagmus evident; c/o dizziness upon sitting up Left Sidelying: negative; c/o dizziness upon sitting up    VESTIBULAR TREATMENT:  DATE: 01/04/23   PATIENT EDUCATION: Education details: prognosis, POC, HEP Person educated: Patient Education method: Explanation, Demonstration, Tactile cues, Verbal cues, and Handouts Education comprehension: verbalized understanding and returned demonstration  HOME EXERCISE PROGRAM: Access Code: [email or web address] URL: [email or web address] Date: 01/04/2023 Prepared by: [facility] - Outpatient  Rehab - [facility]  Exercises - Seated Right Head Turns Vestibular Habituation  - 1 x daily - 5 x weekly - 2 sets - 30 sec hold - Seated Head Nod Vestibular Habituation  - 1 x daily - 5 x weekly - 2 sets - 30 sec hold - Brandt-Daroff Vestibular Exercise  - 1 x daily - 5 x weekly - 2 sets - 3-5 reps  GOALS: Goals reviewed with patient? Yes  SHORT TERM GOALS: Target date: 01/25/2023  Patient to be independent with initial HEP. Baseline: HEP initiated Goal status: MET    LONG TERM GOALS: Target date: 02/15/2023  Patient to be independent with advanced HEP. Baseline: Not yet initiated  Goal status: IN PROGRESS  Patient  to report 0/10 dizziness with standing  horizontal VOR for 30 seconds. Baseline: Unable Goal status: IN PROGRESS  Patient will report 0/10 dizziness with bed mobility.  Baseline: Symptomatic  Goal status: IN PROGRESS  Patient to report resolution of dizziness with visual movement provocation.  Baseline: symptomatic  Goal status: IN PROGRESS  Patient to score at least 45/56 on Berg in order to decrease risk of falls. Baseline: NT Goal status: IN PROGRESS  Patient to complete 5xSTS in 20 sec with good stability. Baseline: 39.49 sec pushing off edge of chair  01/09/23 Goal status: IN PROGRESS  Patient to score at least 57 on FOTO in order to indicate improved functional outcomes.  Baseline: 53.3227 01/09/23 Goal status: IN PROGRESS   ASSESSMENT:  CLINICAL IMPRESSION: Patient arrived to session with large bruise over eyes and forehead s/p fall when he was standing on a chair to reach into a cabinet, which then fell on him. Reports some R thigh soreness (denies bruising over this area) and denies dizziness, headaches, or changes in balance since the fall. Patient had f/u appointment with PCP after his fall and no imaging was recommended. Vitals at start of session were slightly above baseline (reading from PCP's office 12/25/22 was 148/80). Patient performed LE strengthening and balance activities this session. He appeared slightly more fatigued today, requiring sitting rest breaks but denied symptoms besides fatigue.  Imbalance evident with activities without UE support and encouraged use of hands hovering above the counter for use of reaching strategy. Vitals stayed stable throughout session. No complaints besides fatigue upon leaving.   OBJECTIVE IMPAIRMENTS: Abnormal gait, decreased activity tolerance, decreased balance, difficulty walking, and dizziness.   ACTIVITY LIMITATIONS: carrying, lifting, bending, sitting, standing, squatting, sleeping, stairs, transfers, bed mobility,  bathing, toileting, dressing, reach over head, hygiene/grooming, and locomotion level  PARTICIPATION LIMITATIONS: meal prep, cleaning, laundry, shopping, community activity, occupation, and church  PERSONAL FACTORS: Age, Fitness, Past/current experiences, Time since onset of injury/illness/exacerbation, and 3+ comorbidities: Prostate CA s/p prostatectomy 2004, hx DVT, HLD, HTN, L shoulder fx, radial head fx, THA x2, lumbar surgery  are also affecting patient's functional outcome.   REHAB POTENTIAL: Good  CLINICAL DECISION MAKING: Evolving/moderate complexity  EVALUATION COMPLEXITY: Moderate   PLAN:  PT FREQUENCY: 1-2x/week  PT DURATION: 6 weeks  PLANNED INTERVENTIONS: 97164- PT Re-evaluation, 97110-Therapeutic exercises, 97530- Therapeutic activity, [id number]- Neuromuscular re-education, 97535- Self Care, 52841- Manual therapy, [id number]- Gait training, [phone]- Canalith repositioning, [id number]- Aquatic Therapy, 97014- Electrical stimulation (unattended),  82956- Electrical stimulation (manual), Patient/Family education, Balance training, Stair training, Taping, Dry Needling, Joint mobilization, Vestibular training, Cryotherapy, and Moist heat  PLAN FOR NEXT SESSION: balance with and without vestibular challenges, work on addressing endurance     Anette Guarneri, PT, DPT 02/12/23 10:24 AM   Outpatient Rehab at [facility] [address], [address] [place], [place] [address] Phone # [phone] Fax # [phone]

## 2023-02-13 ENCOUNTER — Ambulatory Visit: Payer: Medicare Other | Attending: Internal Medicine | Admitting: Physical Therapy

## 2023-02-13 ENCOUNTER — Encounter: Payer: Self-pay | Admitting: Physical Therapy

## 2023-02-13 DIAGNOSIS — R42 Dizziness and giddiness: Secondary | ICD-10-CM | POA: Diagnosis not present

## 2023-02-13 DIAGNOSIS — R2681 Unsteadiness on feet: Secondary | ICD-10-CM | POA: Diagnosis not present

## 2023-02-13 DIAGNOSIS — R2689 Other abnormalities of gait and mobility: Secondary | ICD-10-CM | POA: Diagnosis not present

## 2023-02-13 NOTE — Patient Outreach (Signed)
  Care Coordination   Follow Up Visit Note   02/13/2023 Name: Ronald Stafford MRN: [id number] DOB: 1930/09/26  Ronald Stafford is a 89 y.o. year old male who sees Ronald Stafford for primary care. I spoke with  Ronald Stafford son Ronald Stafford by phone today.  What matters to the patients health and wellness today?  Ronald Stafford requests caregiver support and pca services for father Ronald Stafford.    Goals Addressed             This Visit's Progress    Care Coordination       Activities and task to complete in order to accomplish goals.   Call your insurance provider for more information about your Enhanced Benefits (pcs services ) Review private pay home care options provided and discussed  Reapply for medicaid  LCSW A Figueora placed Ronald Stafford on meals on wheels waiting list         SDOH assessments and interventions completed:  Yes     Care Coordination Interventions:  Yes, provided      Follow up plan: Follow up call scheduled for 02/26/2023    Encounter Outcome:  Patient Visit Completed   Ronald Stafford MSW, LCSW Licensed Clinical Social Worker  [facility], Population Health Direct Dial: [phone]  Fax: [phone]

## 2023-02-19 ENCOUNTER — Ambulatory Visit: Payer: Self-pay | Admitting: Licensed Clinical Social Worker

## 2023-02-20 NOTE — Patient Outreach (Signed)
  Care Coordination   Follow Up Visit Note   02/19/2023 Name: Ronald Stafford MRN: [id number] DOB: January 25, 1931  Ronald Stafford is a 89 y.o. year old male who sees Burns, Bobette Mo, MD for primary care. I spoke with  Ronald Stafford by phone today.  What matters to the patients health and wellness today?  Follow up on PCA services for patient. Advise Ronald Stafford to contact [facility] and send a list of dental providers via email.    Goals Addressed   None     SDOH assessments and interventions completed:  Yes     Care Coordination Interventions:  Yes, provided {THN Tip this will not be part of the note when signed-REQUIRED REPORT FIELD DO NOT DELETE (Optional):27901 Interventions Today    Flowsheet Row Most Recent Value  General Interventions   Level of Care --  [referral sent to [facility]cl[facility]fts from caps-da program]        Follow up plan: Follow up call scheduled for 03/05/2023    Encounter Outcome:  Patient Visit Completed   Gwyndolyn Saxon MSW, LCSW Licensed Clinical Social Worker  [facility], Population Health Direct Dial: [phone]  Fax: [phone]

## 2023-03-21 ENCOUNTER — Ambulatory Visit: Payer: Self-pay | Admitting: Internal Medicine

## 2023-03-21 NOTE — Telephone Encounter (Signed)
 Copied from CRM [id number]. Topic: Clinical - Red Word Triage >> Mar 21, 2023  3:59 PM Tiffany H wrote: Red Word that prompted transfer to Nurse Triage: Red word: PAIN. Patient advised that a cabinet fell on him three weeks ago. He now has extreme pain in his shoulder, it feels like it's going to pop out of the socket. Please assist. Patient is currently scheduled to see Dr. Geofm on 03/29/23.  Chief Complaint: Left shoulder pain Symptoms: Pain and weakness Frequency: 3 weeks Pertinent Negatives: Patient denies relief Disposition: [] ED /[] Urgent Care (no appt availability in office) / [x] Appointment(In office/virtual)/ []  [facility]/ [] Home Care/ [] Refused Recommended Disposition /[] [facility]/ []  Follow-up with PCP Additional Notes: Patient called in complaining of pain in his left shoulder. Patient stated that a cabinet fell on top of him 3 weeks ago. Patient stated that no skin was broken and he did not obtain a head injury. Patient reported that he is still able to move his left arm normally, but has severe pain (8 or 9) when doing so. Patient denies numbness and tingling. Patient stated that sometimes he can feel the shoulder pop, but that nothing looks out of place. Patient stated he can feel a small knot on his left shoulder. Advised patient to see provider within 24 hours. No availability at current PCP office. Scheduled patient for tomorrow at [facility]. Advised patient to take Tylenol  or ibuprofen  ever 4-6 hours and apply ice for pain as needed. Advised patient to call back if symptoms worsen. Patient complied.   Reason for Disposition  [1] MODERATE pain (e.g., interferes with normal activities) AND [2] high-risk adult (e.g., age > 60 years, osteoporosis, chronic steroid use)  Answer Assessment - Initial Assessment Questions 1. ONSET: When did the pain start?     3 weeks ago  2. LOCATION: Where is the pain located?     Left shoulder  3. PAIN: How bad  is the pain? (Scale 1-10; or mild, moderate, severe)   - MILD (1-3): doesn't interfere with normal activities   - MODERATE (4-7): interferes with normal activities (e.g., work or school) or awakens from sleep   - SEVERE (8-10): excruciating pain, unable to do any normal activities, unable to move arm at all due to pain     Patient rates pain at 8 or 9 right now  4. WORK OR EXERCISE: Has there been any recent work or exercise that involved this part of the body?     Patient states he can use arm normally, but it is in constant pain  5. CAUSE: What do you think is causing the shoulder pain?     Patient states he pulled cabinet on top of him 3 weeks ago  6. OTHER SYMPTOMS: Do you have any other symptoms? (e.g., neck pain, swelling, rash, fever, numbness, weakness)     Throbbing and weakness in left shoulder, denies numbess and tingling  Answer Assessment - Initial Assessment Questions 1. MECHANISM: How did the injury happen?     Patient states he pulled a cabinet on top of him 3 weeks ago   2. ONSET: When did the injury happen? (Minutes or hours ago)      3 weeks ago  3. APPEARANCE of INJURY: What does the injury look like?      Patient states left shoulder appears normal, but he can feel a small knot, patient denies open skin  4. SEVERITY: Can you move the shoulder normally?  Patient states he can move his shoulder normally, but it is in constant pain  5. SIZE: For cuts, bruises, or swelling, ask: How large is it? (e.g., inches or centimeters;  entire joint)      Patient describes a knot the size of an acorn   6. PAIN: Is there pain? If Yes, ask: How bad is the pain?   (e.g., Scale 1-10; or mild, moderate, severe)   - MILD (1-3): doesn't interfere with normal activities   - MODERATE (4-7): interferes with normal activities (e.g., work or school) or awakens from sleep   - SEVERE (8-10): excruciating pain, unable to do any normal activities, unable to move arm  at all due to pain     Patient rates pain as an 8 or 9 at the moment  8. OTHER SYMPTOMS: Do you have any other symptoms? (e.g., loss of sensation)     Patient describes throbbing and weakness in left should, patient states that sometimes he can feel the shoulder pop, patient denies numbness and tingling  Protocols used: Shoulder Pain-A-AH, Shoulder Injury-A-AH

## 2023-03-22 ENCOUNTER — Ambulatory Visit (INDEPENDENT_AMBULATORY_CARE_PROVIDER_SITE_OTHER): Payer: Medicare Other | Admitting: Student

## 2023-03-22 ENCOUNTER — Ambulatory Visit: Payer: Medicare Other | Admitting: Physician Assistant

## 2023-03-22 ENCOUNTER — Ambulatory Visit (HOSPITAL_BASED_OUTPATIENT_CLINIC_OR_DEPARTMENT_OTHER): Payer: Medicare Other

## 2023-03-22 ENCOUNTER — Encounter (HOSPITAL_BASED_OUTPATIENT_CLINIC_OR_DEPARTMENT_OTHER): Payer: Self-pay | Admitting: Student

## 2023-03-22 DIAGNOSIS — M25512 Pain in left shoulder: Secondary | ICD-10-CM

## 2023-03-22 DIAGNOSIS — M19012 Primary osteoarthritis, left shoulder: Secondary | ICD-10-CM

## 2023-03-22 DIAGNOSIS — M25712 Osteophyte, left shoulder: Secondary | ICD-10-CM | POA: Diagnosis not present

## 2023-03-22 MED ORDER — LIDOCAINE HCL 1 % IJ SOLN
4.0000 mL | INTRAMUSCULAR | Status: AC | PRN
  Administered 2023-03-22: 4 mL

## 2023-03-22 MED ORDER — TRIAMCINOLONE ACETONIDE 40 MG/ML IJ SUSP
2.0000 mL | INTRAMUSCULAR | Status: AC | PRN
  Administered 2023-03-22: 2 mL via INTRA_ARTICULAR

## 2023-03-22 NOTE — Progress Notes (Signed)
 Chief Complaint: Left shoulder pain     History of Present Illness:    Ronald HAEN is a very pleasant 89 y.o. male presenting today with his son for evaluation of left shoulder pain.  Patient had an injury on 01/25/23 when he had a [place] cabinet fall onto him and pinned him underneath.  He was seen afterwards by Dr. Joane in sports medicine with negative x-rays.  He had previously had a glenohumeral cortisone injection on 10/21 as well as physical therapy for strengthening and balance.  Today reports a pain level of 8/10 in the shoulder which returned about 3 weeks ago.  Son reports that patient has had multiple falls within the past few weeks.  Has been using heat, ice, and taking Tylenol  as needed.   Surgical History:   None  PMH/PSH/Family History/Social History/Meds/Allergies:    Past Medical History:  Diagnosis Date   Cancer [facility]) 2004   prostate cancer, Dr. Alline   Colon polyps    Deep venous thrombosis (HCC) 1997   Postop   Depression    GERD (gastroesophageal reflux disease)    Hyperlipidemia    Hypertension    Radial head fracture, closed 09/18/2013   Shoulder fracture, left 02/23/2011   immobilized in sling , Dr Norris(no surgery)   Past Surgical History:  Procedure Laterality Date   COLONOSCOPY  2006   Negative,Dr.Edwards   colonoscopy with polypectomy      PMH of   ESOPHAGOGASTRODUODENOSCOPY N/A 02/23/2017   Procedure: ESOPHAGOGASTRODUODENOSCOPY (EGD);  Surgeon: Elicia Claw, MD;  Location: [facility];  Service: Gastroenterology;  Laterality: N/A;   JOINT REPLACEMENT  1997, 2001   Total Hip replacement X 2   LUMBAR SPINE SURGERY  09/28/2010   Dr Burnetta; for spinal stenosis   MECKEL DIVERTICULUM EXCISION     Age 18   PROSTATECTOMY  2004   w/ radiation, Dr Alline   TOTAL HIP REVISION Left 02/14/2017   Procedure: Left hip polyethylene EXCHANGE;  Surgeon: Melodi Lerner, MD;  Location: [facility];  Service: Orthopedics;   Laterality: Left;   VASECTOMY     Social History   Socioeconomic History   Marital status: Widowed    Spouse name: Not on file   Number of children: 2   Years of education: Not on file   Highest education level: Not on file  Occupational History   Occupation: retired  Tobacco Use   Smoking status: Former    Current packs/day: 0.00    Average packs/day: 0.5 packs/day for 5.0 years (2.5 ttl pk-yrs)    Types: Cigarettes    Start date: 03/14/1983    Quit date: 03/13/1988    Years since quitting: 35.0   Smokeless tobacco: Former   Tobacco comments:    Quit at about age 46 / chewed tobacca x 1 year  Vaping Use   Vaping status: Never Used  Substance and Sexual Activity   Alcohol use: No    Alcohol/week: 0.0 standard drinks of alcohol   Drug use: No   Sexual activity: Never  Other Topics Concern   Not on file  Social History Narrative   Lives alone; Wife passed 2012-12-03;   Died of ovarian cancer; breast cancer; other cancer;      Social Drivers of Corporate Investment Banker Strain: Low Risk  (  08/28/2022)   Overall Financial Resource Strain (CARDIA)    Difficulty of Paying Living Expenses: Not hard at all  Food Insecurity: No Food Insecurity (02/01/2023)   Hunger Vital Sign    Worried About Running Out of Food in the Last Year: Never true    Ran Out of Food in the Last Year: Never true  Transportation Needs: No Transportation Needs (08/28/2022)   PRAPARE - Administrator, Civil Service (Medical): No    Lack of Transportation (Non-Medical): No  Physical Activity: Sufficiently Active (08/28/2022)   Exercise Vital Sign    Days of Exercise per Week: 5 days    Minutes of Exercise per Session: 30 min  Stress: No Stress Concern Present (08/28/2022)   [employer] of Occupational Health - Occupational Stress Questionnaire    Feeling of Stress : Not at all  Social Connections: Moderately Integrated (08/28/2022)   Social Connection and Isolation Panel [NHANES]     Frequency of Communication with Friends and Family: More than three times a week    Frequency of Social Gatherings with Friends and Family: More than three times a week    Attends Religious Services: More than 4 times per year    Active Member of [employer] or Organizations: Yes    Attends Banker Meetings: More than 4 times per year    Marital Status: Widowed  Recent Concern: Social Connections - Moderately Isolated (08/28/2022)   Social Connection and Isolation Panel [NHANES]    Frequency of Communication with Friends and Family: More than three times a week    Frequency of Social Gatherings with Friends and Family: Three times a week    Attends Religious Services: More than 4 times per year    Active Member of Clubs or Organizations: No    Attends Banker Meetings: Never    Marital Status: Widowed   Family History  Problem Relation Age of Onset   Prostate cancer Father    Testicular cancer Son    Allergies  Allergen Reactions   Chicken Allergy Nausea And Vomiting and Other (See Comments)    Digestive problems   Current Outpatient Medications  Medication Sig Dispense Refill   acetaminophen  (TYLENOL ) 500 MG tablet Take 1,000 mg by mouth every 6 (six) hours as needed for mild pain.     atorvastatin  (LIPITOR) 40 MG tablet Take 1 tablet by mouth once daily 90 tablet 0   carvedilol  (COREG ) 6.25 MG tablet Take 1 tablet (6.25 mg total) by mouth 2 (two) times daily with a meal. 60 tablet 2   cefdinir  (OMNICEF ) 300 MG capsule Take 1 capsule (300 mg total) by mouth 2 (two) times daily. 20 capsule 0   clopidogrel  (PLAVIX ) 75 MG tablet Take 1 tablet (75 mg total) by mouth every morning. 90 tablet 5   ipratropium-albuterol  (DUONEB) 0.5-2.5 (3) MG/3ML SOLN Take 3 mLs by nebulization every 6 (six) hours as needed (shortness of breath).     meclizine  (ANTIVERT ) 12.5 MG tablet Take 1 tablet (12.5 mg total) by mouth 3 (three) times daily as needed for dizziness. 60 tablet 2    MYRBETRIQ  25 MG TB24 tablet TAKE 1 TABLET BY MOUTH ONCE DAILY AS NEEDED 90 tablet 0   Omega-3 Fatty Acids (FISH OIL) 500 MG CAPS Take 500 mg by mouth daily.     tiZANidine  (ZANAFLEX ) 2 MG tablet Take 1 tablet (2 mg total) by mouth daily as needed for muscle spasms. 15 tablet 0   valsartan  (DIOVAN ) 80  MG tablet Take 1 tablet (80 mg total) by mouth daily. 90 tablet 3   No current facility-administered medications for this visit.   No results found.  Review of Systems:   A ROS was performed including pertinent positives and negatives as documented in the HPI.  Physical Exam :   Constitutional: NAD and appears stated age Neurological: Alert and oriented Psych: Appropriate affect and cooperative There were no vitals taken for this visit.   Comprehensive Musculoskeletal Exam:    Left shoulder exam demonstrates tenderness of the anterior and posterior glenohumeral joint.  Active range of motion to 100 degrees forward flexion and 30 degrees external rotation with pain.  No overlying erythema or ecchymosis.  Imaging:   Xray (left shoulder 3 views): Moderate to severe glenohumeral and AC joint osteoarthritis without evidence of acute abnormality.   I personally reviewed and interpreted the radiographs.   Assessment:   89 y.o. male with evidence of advanced glenohumeral osteoarthritis of the left shoulder.  X-rays today remain negative for any fracture or acute abnormality.  Discussed that I would opt to continue managing this conservatively versus considering surgical intervention.  Given that last cortisone injection was approximately 3 months ago I have offered to repeat this today and patient and his son are agreeable to this plan.  Cortisone injection was performed of the left glenohumeral joint under ultrasound guidance without any complication.  Patient is looking into options for physical therapy and aerobics through his gym which I am in support of.  Could consider further referral to PT  if needed.  Plan :    - Left shoulder glenohumeral injection performed today - Return to clinic as needed     Procedure Note  Patient: Ronald Stafford             Date of Birth: 1930/06/11           MRN: [id number]             Visit Date: 03/22/2023  Procedures: Visit Diagnoses:  1. Primary osteoarthritis, left shoulder     Large Joint Inj: L glenohumeral on 03/22/2023 5:00 PM Indications: pain Details: 22 G 1.5 in needle, ultrasound-guided anterior approach Medications: 4 mL lidocaine  1 %; 2 mL triamcinolone  acetonide 40 MG/ML Outcome: tolerated well, no immediate complications Procedure, treatment alternatives, risks and benefits explained, specific risks discussed. Consent was given by the patient. Immediately prior to procedure a time out was called to verify the correct patient, procedure, equipment, support staff and site/side marked as required. Patient was prepped and draped in the usual sterile fashion.      I personally saw and evaluated the patient, and participated in the management and treatment plan.  Leonce Reveal, PA-C Orthopedics

## 2023-03-28 ENCOUNTER — Encounter: Payer: Self-pay | Admitting: Internal Medicine

## 2023-03-28 NOTE — Progress Notes (Deleted)
    Subjective:    Patient ID: Ronald Stafford, male    DOB: [birth year], 89 y.o.   MRN: [id number]      HPI [facility] is here for No chief complaint on file.   Left shoulder pain -cabinet fell on him in October.  03/22/2023-saw orthopedics for left shoulder.  Had an injection.    Medications and allergies reviewed with patient and updated if appropriate.  Current Outpatient Medications on File Prior to Visit  Medication Sig Dispense Refill   acetaminophen (TYLENOL) 500 MG tablet Take 1,000 mg by mouth every 6 (six) hours as needed for mild pain.     atorvastatin (LIPITOR) 40 MG tablet Take 1 tablet by mouth once daily 90 tablet 0   carvedilol (COREG) 6.25 MG tablet Take 1 tablet (6.25 mg total) by mouth 2 (two) times daily with a meal. 60 tablet 2   cefdinir (OMNICEF) 300 MG capsule Take 1 capsule (300 mg total) by mouth 2 (two) times daily. 20 capsule 0   clopidogrel (PLAVIX) 75 MG tablet Take 1 tablet (75 mg total) by mouth every morning. 90 tablet 5   ipratropium-albuterol (DUONEB) 0.5-2.5 (3) MG/3ML SOLN Take 3 mLs by nebulization every 6 (six) hours as needed (shortness of breath).     meclizine (ANTIVERT) 12.5 MG tablet Take 1 tablet (12.5 mg total) by mouth 3 (three) times daily as needed for dizziness. 60 tablet 2   MYRBETRIQ 25 MG TB24 tablet TAKE 1 TABLET BY MOUTH ONCE DAILY AS NEEDED 90 tablet 0   Omega-3 Fatty Acids (FISH OIL) 500 MG CAPS Take 500 mg by mouth daily.     tiZANidine (ZANAFLEX) 2 MG tablet Take 1 tablet (2 mg total) by mouth daily as needed for muscle spasms. 15 tablet 0   valsartan (DIOVAN) 80 MG tablet Take 1 tablet (80 mg total) by mouth daily. 90 tablet 3   No current facility-administered medications on file prior to visit.    Review of Systems     Objective:  There were no vitals filed for this visit. BP Readings from Last 3 Encounters:  02/12/23 (!) 150/84  01/29/23 (!) 100/58  01/01/23 (!) 148/80   Wt Readings from Last 3 Encounters:  02/12/23  179 lb (81.2 kg)  01/29/23 177 lb (80.3 kg)  01/01/23 180 lb (81.6 kg)   There is no height or weight on file to calculate BMI.    Physical Exam         Assessment & Plan:    See Problem List for Assessment and Plan of chronic medical problems.

## 2023-03-29 ENCOUNTER — Ambulatory Visit: Payer: Medicare Other | Admitting: Internal Medicine

## 2023-04-05 ENCOUNTER — Ambulatory Visit: Payer: Self-pay | Admitting: Internal Medicine

## 2023-04-05 NOTE — Telephone Encounter (Signed)
Copied from CRM [id number]. Topic: Clinical - Red Word Triage >> Apr 05, 2023  1:04 PM Melissa C wrote: [facility] that prompted transfer to Nurse Triage: patient's chest is hurting him the last few days and he has shortness of breath, he also has pain in his shoulder that is ortho related   Chief Complaint: Chest Pain, Symptoms: Chest Pain, Dizziness Frequency: Acute Pertinent Negatives: Patient denies  Disposition: [x] ED /[] Urgent Care (no appt availability in office) / [] Appointment(In office/virtual)/ []  [place] [facility]/ [] Home Care/ [] Refused Recommended Disposition /[] [place] [facility]/ []  Follow-up with PCP Additional Notes: Ronald Stafford is a 89 year old male being triaged for central chest pain and shortness of breath. Triage assessment reveals dizziness and diaphoresis. The patient has a history of hypertension and hyperlipidemia. No other cardiac events or history. During triage call patient has difficulty making statements and speaks in short phrases. Dialed 911 for immediate evaluation. Advised patient to abstain from eating or drinking and to rest until EMS services got there. Remained on the line with patient, First Responder services arrived first, evaluated blood pressure and the readings were extremely high, unable to measure by first responders. EMS arrived shortly after. 232/98 with recorded, obtainable measurement.   Reason for Disposition  [1] Chest pain lasts > 5 minutes AND [2] age > 30 AND [3] one or more cardiac risk factors (e.g., diabetes, high blood pressure, high cholesterol, smoker, or strong family history of heart disease)  Answer Assessment - Initial Assessment Questions 1. LOCATION: "Where does it hurt?"       Central Chest and Left Shoulder  2. RADIATION: "Does the pain go anywhere else?" (e.g., into neck, jaw, arms, back)     Left ribs  3. ONSET: "When did the chest pain begin?" (Minutes, hours or days)      Yesterday  4. PATTERN: "Does the pain come and  go, or has it been constant since it started?"  "Does it get worse with exertion?"      Constant  5. DURATION: "How long does it last" (e.g., seconds, minutes, hours)    Hours  6. SEVERITY: "How bad is the pain?"  (e.g., Scale 1-10; mild, moderate, or severe)    - MILD (1-3): doesn't interfere with normal activities     - MODERATE (4-7): interferes with normal activities or awakens from sleep    - SEVERE (8-10): excruciating pain, unable to do any normal activities       6  7. CARDIAC RISK FACTORS: "Do you have any history of heart problems or risk factors for heart disease?" (e.g., angina, prior heart attack; diabetes, high blood pressure, high cholesterol, smoker, or strong family history of heart disease)     Hypertension  8. PULMONARY RISK FACTORS: "Do you have any history of lung disease?  (e.g., blood clots in lung, asthma, emphysema, birth control pills)     No  9. CAUSE: "What do you think is causing the chest pain?"     Unsure  10. OTHER SYMPTOMS: "Do you have any other symptoms?" (e.g., dizziness, nausea, vomiting, sweating, fever, difficulty breathing, cough)       Dizzy, Difficulty Breathing  Protocols used: Chest Pain-A-AH

## 2023-04-05 NOTE — Telephone Encounter (Signed)
Noted  

## 2023-05-14 ENCOUNTER — Ambulatory Visit (INDEPENDENT_AMBULATORY_CARE_PROVIDER_SITE_OTHER): Payer: Medicare Other | Admitting: Internal Medicine

## 2023-05-14 ENCOUNTER — Encounter: Payer: Self-pay | Admitting: Internal Medicine

## 2023-05-14 VITALS — BP 130/74 | HR 100 | Temp 97.6°F | Ht 74.0 in | Wt 178.0 lb

## 2023-05-14 DIAGNOSIS — Z125 Encounter for screening for malignant neoplasm of prostate: Secondary | ICD-10-CM | POA: Diagnosis not present

## 2023-05-14 DIAGNOSIS — R39198 Other difficulties with micturition: Secondary | ICD-10-CM | POA: Diagnosis not present

## 2023-05-14 DIAGNOSIS — R1032 Left lower quadrant pain: Secondary | ICD-10-CM | POA: Diagnosis not present

## 2023-05-14 DIAGNOSIS — I10 Essential (primary) hypertension: Secondary | ICD-10-CM

## 2023-05-14 DIAGNOSIS — Z8673 Personal history of transient ischemic attack (TIA), and cerebral infarction without residual deficits: Secondary | ICD-10-CM

## 2023-05-14 DIAGNOSIS — E7849 Other hyperlipidemia: Secondary | ICD-10-CM

## 2023-05-14 LAB — CBC WITH DIFFERENTIAL/PLATELET
Basophils Absolute: 0.2 10*3/uL — ABNORMAL HIGH (ref 0.0–0.1)
Basophils Relative: 2.2 % (ref 0.0–3.0)
Eosinophils Absolute: 0.1 10*3/uL (ref 0.0–0.7)
Eosinophils Relative: 1 % (ref 0.0–5.0)
HCT: 42.5 % (ref 39.0–52.0)
Hemoglobin: 14.2 g/dL (ref 13.0–17.0)
Lymphocytes Relative: 11.2 % — ABNORMAL LOW (ref 12.0–46.0)
Lymphs Abs: 0.9 10*3/uL (ref 0.7–4.0)
MCHC: 33.5 g/dL (ref 30.0–36.0)
MCV: 93.9 fl (ref 78.0–100.0)
Monocytes Absolute: 1.3 10*3/uL — ABNORMAL HIGH (ref 0.1–1.0)
Monocytes Relative: 17 % — ABNORMAL HIGH (ref 3.0–12.0)
Neutro Abs: 5.5 10*3/uL (ref 1.4–7.7)
Neutrophils Relative %: 68.6 % (ref 43.0–77.0)
Platelets: 177 10*3/uL (ref 150.0–400.0)
RBC: 4.53 Mil/uL (ref 4.22–5.81)
RDW: 15.5 % (ref 11.5–15.5)
WBC: 7.9 10*3/uL (ref 4.0–10.5)

## 2023-05-14 LAB — PSA, MEDICARE: PSA: 0.01 ng/mL — ABNORMAL LOW (ref 0.10–4.00)

## 2023-05-14 LAB — COMPREHENSIVE METABOLIC PANEL
ALT: 20 U/L (ref 0–53)
AST: 21 U/L (ref 0–37)
Albumin: 4.5 g/dL (ref 3.5–5.2)
Alkaline Phosphatase: 62 U/L (ref 39–117)
BUN: 16 mg/dL (ref 6–23)
CO2: 31 meq/L (ref 19–32)
Calcium: 9.8 mg/dL (ref 8.4–10.5)
Chloride: 106 meq/L (ref 96–112)
Creatinine, Ser: 0.92 mg/dL (ref 0.40–1.50)
GFR: 73.25 mL/min (ref >60.00)
Glucose, Bld: 95 mg/dL (ref 70–99)
Potassium: 4.1 meq/L (ref 3.5–5.1)
Sodium: 145 meq/L (ref 135–145)
Total Bilirubin: 0.9 mg/dL (ref 0.2–1.2)
Total Protein: 6.9 g/dL (ref 6.0–8.3)

## 2023-05-14 LAB — URINALYSIS, ROUTINE W REFLEX MICROSCOPIC
Bilirubin Urine: NEGATIVE
Hgb urine dipstick: NEGATIVE
Ketones, ur: NEGATIVE
Leukocytes,Ua: NEGATIVE
Nitrite: NEGATIVE
RBC / HPF: NONE SEEN (ref 0–?)
Specific Gravity, Urine: 1.015 (ref 1.000–1.030)
Total Protein, Urine: NEGATIVE
Urine Glucose: NEGATIVE
Urobilinogen, UA: 1 (ref 0.0–1.0)
pH: 7.5 (ref 5.0–8.0)

## 2023-05-14 MED ORDER — VALSARTAN 80 MG PO TABS: 80.0000 mg | ORAL_TABLET | Freq: Every day | ORAL | 2 refills | Status: DC

## 2023-05-14 MED ORDER — ATORVASTATIN CALCIUM 40 MG PO TABS
40.0000 mg | ORAL_TABLET | Freq: Every day | ORAL | 2 refills | Status: DC
Start: 2023-05-14 — End: 2023-08-09

## 2023-05-14 MED ORDER — CLOPIDOGREL BISULFATE 75 MG PO TABS: 75.0000 mg | ORAL_TABLET | Freq: Every morning | ORAL | 2 refills | Status: DC

## 2023-05-14 NOTE — Assessment & Plan Note (Signed)
 Chronic Continue atorvastatin 40 mg daily-refill sent today

## 2023-05-14 NOTE — Patient Instructions (Addendum)
      Blood work and urine tests were ordered.       Medications changes include :   None    A Ct scan of your kidneys was ordered and someone will call you to schedule an appointment.     Return if symptoms worsen or fail to improve.

## 2023-05-14 NOTE — Assessment & Plan Note (Signed)
 Acute Started 2-3 weeks ago Weak stream, prolonged urination to empty bladder, nocturia x 2 History of prostate cancer years ago History of nephrolithiasis He is having some left mid abdominal pain CT renal UA, urine culture CBC, CMP

## 2023-05-14 NOTE — Assessment & Plan Note (Signed)
 Chronic Blood pressure controlled and has been controlled at home No changes in medication Continue to monitor BP at home Continue valsartan 80 mg daily

## 2023-05-14 NOTE — Assessment & Plan Note (Signed)
 PSA Has history of prostate cancer

## 2023-05-14 NOTE — Assessment & Plan Note (Signed)
 Chronic continue plavix 75 mg and atorvastatin 40 mg daily

## 2023-05-14 NOTE — Progress Notes (Signed)
 Subjective:    Patient ID: Ronald Stafford, male    DOB: May 07, 1930, 89 y.o.   MRN: [id number]      HPI Ronald Stafford is here for  Chief Complaint  Patient presents with   Dysuria    Having difficulty urinating, been going for about 3 months. No pain or pressure in the abdominal area. Pain in th left abdominal area    H/o prostate ca.  Has urinary incontinence no longer on myrbetriq 25 mg daily.    Having difficulty urinating x 2-3 weeks.  His urine stream is weak and it takes longer to go to the bathroom.  He has nocturia x 2. Has left lower abdominal pain - mild, intermittent.    No dysuria. No hematuria   He has been drinking coffee - 2 cups in the morning.  He usually drinks more water and now drinking less.    Ct scan 03/2022 - L renal stone.  He is almost out of his 3 medications and needs refills today.  This is the only medications he takes on a daily basis.   Medications and allergies reviewed with patient and updated if appropriate.  Current Outpatient Medications on File Prior to Visit  Medication Sig Dispense Refill   acetaminophen (TYLENOL) 500 MG tablet Take 1,000 mg by mouth every 6 (six) hours as needed for mild pain.     atorvastatin (LIPITOR) 40 MG tablet Take 1 tablet by mouth once daily 90 tablet 0   carvedilol (COREG) 6.25 MG tablet Take 1 tablet (6.25 mg total) by mouth 2 (two) times daily with a meal. 60 tablet 2   cefdinir (OMNICEF) 300 MG capsule Take 1 capsule (300 mg total) by mouth 2 (two) times daily. 20 capsule 0   clopidogrel (PLAVIX) 75 MG tablet Take 1 tablet (75 mg total) by mouth every morning. 90 tablet 5   ipratropium-albuterol (DUONEB) 0.5-2.5 (3) MG/3ML SOLN Take 3 mLs by nebulization every 6 (six) hours as needed (shortness of breath).     meclizine (ANTIVERT) 12.5 MG tablet Take 1 tablet (12.5 mg total) by mouth 3 (three) times daily as needed for dizziness. 60 tablet 2   MYRBETRIQ 25 MG TB24 tablet TAKE 1 TABLET BY MOUTH ONCE DAILY AS NEEDED  90 tablet 0   Omega-3 Fatty Acids (FISH OIL) 500 MG CAPS Take 500 mg by mouth daily.     tiZANidine (ZANAFLEX) 2 MG tablet Take 1 tablet (2 mg total) by mouth daily as needed for muscle spasms. 15 tablet 0   valsartan (DIOVAN) 80 MG tablet Take 1 tablet (80 mg total) by mouth daily. 90 tablet 3   No current facility-administered medications on file prior to visit.    Review of Systems  Constitutional:  Negative for fever.  Gastrointestinal:  Positive for abdominal pain (left lower side - intermittent). Negative for nausea.  Genitourinary:  Positive for difficulty urinating and frequency. Negative for dysuria and hematuria.       Objective:   Vitals:   05/14/23 0803  BP: 130/74  Pulse: 100  Temp: 97.6 F (36.4 C)  SpO2: 94%   BP Readings from Last 3 Encounters:  05/14/23 130/74  02/12/23 (!) 150/84  01/29/23 (!) 100/58   Wt Readings from Last 3 Encounters:  05/14/23 178 lb (80.7 kg)  02/12/23 179 lb (81.2 kg)  01/29/23 177 lb (80.3 kg)   Body mass index is 22.85 kg/m.    Physical Exam Constitutional:      General: He  is not in acute distress.    Appearance: Normal appearance. He is not ill-appearing.  HENT:     Head: Normocephalic and atraumatic.  Abdominal:     General: There is no distension.     Palpations: Abdomen is soft. There is no mass.     Tenderness: There is abdominal tenderness (mild tenderness just left of umbilicus). There is no left CVA tenderness, guarding or rebound.  Musculoskeletal:     Right lower leg: No edema.     Left lower leg: No edema.  Skin:    General: Skin is warm and dry.  Neurological:     Mental Status: He is alert.            Assessment & Plan:    See Problem List for Assessment and Plan of chronic medical problems.

## 2023-05-14 NOTE — Assessment & Plan Note (Addendum)
 Acute Started 2-3 weeks ago-he is not very certain of how long the symptoms have been going on Associated with some difficulty urinating/weak stream, left mid abdominal pain History of prostate cancer, history of nephrolithiasis UA, urine culture to rule out UTI CT renal for possible nephrolithiasis, obstruction CBC, CMP, PSA

## 2023-05-16 ENCOUNTER — Ambulatory Visit
Admission: RE | Admit: 2023-05-16 | Discharge: 2023-05-16 | Disposition: A | Discharge status: Home / Self Care | Source: Ambulatory Visit | Attending: Internal Medicine | Admitting: Internal Medicine

## 2023-05-16 DIAGNOSIS — R109 Unspecified abdominal pain: Secondary | ICD-10-CM | POA: Diagnosis not present

## 2023-05-16 DIAGNOSIS — R1032 Left lower quadrant pain: Secondary | ICD-10-CM

## 2023-05-16 DIAGNOSIS — N281 Cyst of kidney, acquired: Secondary | ICD-10-CM | POA: Diagnosis not present

## 2023-05-16 DIAGNOSIS — N329 Bladder disorder, unspecified: Secondary | ICD-10-CM | POA: Diagnosis not present

## 2023-05-17 ENCOUNTER — Telehealth: Payer: Self-pay | Admitting: Internal Medicine

## 2023-05-17 MED ORDER — TAMSULOSIN HCL 0.4 MG PO CAPS: 0.4000 mg | ORAL_CAPSULE | Freq: Every day | ORAL | 0 refills | Status: AC

## 2023-05-17 NOTE — Telephone Encounter (Signed)
 Please call him regarding his CT scan of his kidneys.  It looks like there may be a very small stone in your bladder which may have been the cause of your pain.  Has the pain resolved?  That stone is still in your bladder and you may pass it in your urine.  You may or may not know if you pass it because it is very small.  We should consider putting you on Flomax which helps pass the stone-this is a prostate medication and you would take it once a day.  The possible side effect is low blood pressure so you have to be very careful of lightheadedness.  Once you have passed the stone we can stop the medication.  Let me know if he would like to try the medication and I will send it to his pharmacy.-

## 2023-05-17 NOTE — Telephone Encounter (Signed)
 Rx sent

## 2023-05-17 NOTE — Addendum Note (Signed)
 Addended by: Pincus Sanes on: 05/17/2023 04:28 PM   Modules accepted: Orders

## 2023-05-17 NOTE — Telephone Encounter (Signed)
 Called pt and relayed results and findings, pt states he is still having pain and would like to go ahead and try the medication

## 2023-05-24 ENCOUNTER — Ambulatory Visit: Payer: Self-pay | Admitting: Internal Medicine

## 2023-05-24 NOTE — Telephone Encounter (Signed)
  Chief Complaint: Chest Pain Symptoms: SOB Frequency: worsening past 2 days, severe today Pertinent Negatives: Patient denies fever, N/V, sweating Disposition: [x] ED /[] Urgent Care (no appt availability in office) / [] Appointment(In office/virtual)/ []  [place] [facility]/ [] Home Care/ [] Refused Recommended Disposition /[] [facility]/ []  Follow-up with PCP Additional Notes: Pt reports CP began about one month ago with exertion, he reports it has been worse the last 2 days and at it's most severe today, pt reports SOB that is audible to this RN. Advised ED, offered EMS, states his son lives one block away and he will call to get a ride. This RN educated pt on home care, new-worsening symptoms, when to call back/seek emergent care. Pt verbalized understanding and agrees to plan.    Copied from CRM [id number]. Topic: Clinical - Red Word Triage >> May 24, 2023  4:35 PM Florestine Avers wrote: Red Word that prompted transfer to Nurse Triage: Patient called in stating that he is having chest pain. He is also requesting a next day appointment with her if possible. Reason for Disposition  SEVERE chest pain  Answer Assessment - Initial Assessment Questions 1. LOCATION: "Where does it hurt?"       Right in the left side breast 2. RADIATION: "Does the pain go anywhere else?" (e.g., into neck, jaw, arms, back)     neck 3. ONSET: "When did the chest pain begin?" (Minutes, hours or days)      About a month ago, worsening for the last 2 days 4. PATTERN: "Does the pain come and go, or has it been constant since it started?"  "Does it get worse with exertion?"      Comes and goes, when he's up moving 5. DURATION: "How long does it last" (e.g., seconds, minutes, hours)     30 min-1 hour 6. SEVERITY: "How bad is the pain?"  (e.g., Scale 1-10; mild, moderate, or severe)    - MILD (1-3): doesn't interfere with normal activities     - MODERATE (4-7): interferes with normal activities or awakens from  sleep    - SEVERE (8-10): excruciating pain, unable to do any normal activities       9/10 7. CARDIAC RISK FACTORS: "Do you have any history of heart problems or risk factors for heart disease?" (e.g., angina, prior heart attack; diabetes, high blood pressure, high cholesterol, smoker, or strong family history of heart disease)     HTN 8. PULMONARY RISK FACTORS: "Do you have any history of lung disease?"  (e.g., blood clots in lung, asthma, emphysema, birth control pills)     Reports he was told he has a "spot" on the left lung 9. CAUSE: "What do you think is causing the chest pain?"     "Exercise" 10. OTHER SYMPTOMS: "Do you have any other symptoms?" (e.g., dizziness, nausea, vomiting, sweating, fever, difficulty breathing, cough)       SOB  Protocols used: Chest Pain-A-AH

## 2023-06-25 ENCOUNTER — Ambulatory Visit: Payer: Medicare Other | Admitting: Internal Medicine

## 2023-06-25 ENCOUNTER — Encounter: Payer: Self-pay | Admitting: Internal Medicine

## 2023-06-25 NOTE — Patient Instructions (Addendum)
         Medications changes include :   None    A referral was ordered and someone will call you to schedule an appointment.     Return in about 6 months (around 12/26/2023) for follow up.

## 2023-06-25 NOTE — Progress Notes (Unsigned)
      Subjective:    Patient ID: Ronald Stafford, male    DOB: [birth year], 89 y.o.   MRN: [id number]     HPI Helder is here for follow up of his chronic medical problems.  No labs  Medications and allergies reviewed with patient and updated if appropriate.  Current Outpatient Medications on File Prior to Visit  Medication Sig Dispense Refill   acetaminophen (TYLENOL) 500 MG tablet Take 1,000 mg by mouth every 6 (six) hours as needed for mild pain.     atorvastatin (LIPITOR) 40 MG tablet Take 1 tablet (40 mg total) by mouth daily. 90 tablet 2   clopidogrel (PLAVIX) 75 MG tablet Take 1 tablet (75 mg total) by mouth every morning. 90 tablet 2   ipratropium-albuterol (DUONEB) 0.5-2.5 (3) MG/3ML SOLN Take 3 mLs by nebulization every 6 (six) hours as needed (shortness of breath).     Omega-3 Fatty Acids (FISH OIL) 500 MG CAPS Take 500 mg by mouth daily.     tamsulosin (FLOMAX) 0.4 MG CAPS capsule Take 1 capsule (0.4 mg total) by mouth daily. 30 capsule 0   valsartan (DIOVAN) 80 MG tablet Take 1 tablet (80 mg total) by mouth daily. 90 tablet 2   No current facility-administered medications on file prior to visit.     Review of Systems     Objective:  There were no vitals filed for this visit. BP Readings from Last 3 Encounters:  05/14/23 130/74  02/12/23 (!) 150/84  01/29/23 (!) 100/58   Wt Readings from Last 3 Encounters:  05/14/23 178 lb (80.7 kg)  02/12/23 179 lb (81.2 kg)  01/29/23 177 lb (80.3 kg)   There is no height or weight on file to calculate BMI.    Physical Exam     Lab Results  Component Value Date   WBC 7.9 05/14/2023   HGB 14.2 05/14/2023   HCT 42.5 05/14/2023   PLT 177.0 05/14/2023   GLUCOSE 95 05/14/2023   CHOL 103 12/25/2022   TRIG 85.0 12/25/2022   HDL 41.10 12/25/2022   LDLCALC 45 12/25/2022   ALT 20 05/14/2023   AST 21 05/14/2023   NA 145 05/14/2023   K 4.1 05/14/2023   CL 106 05/14/2023   CREATININE 0.92 05/14/2023   BUN 16 05/14/2023    CO2 31 05/14/2023   TSH 3.23 10/31/2018   PSA 0.01 (L) 05/14/2023   INR 1.1 04/07/2022   HGBA1C 5.6 12/25/2022     Assessment & Plan:    See Problem List for Assessment and Plan of chronic medical problems.

## 2023-06-25 NOTE — Assessment & Plan Note (Signed)
 Chronic He has frequent falls-only 1 fall recently He does have someone coming few times a week to help him do some things around the house which will hopefully help decrease falls He uses a cane, sometimes a walker Falls likely multifactorial-spinal stenosis, leg weakness, neuropathy  Discussed neurology referral

## 2023-06-26 ENCOUNTER — Ambulatory Visit (INDEPENDENT_AMBULATORY_CARE_PROVIDER_SITE_OTHER): Payer: Medicare Other | Admitting: Internal Medicine

## 2023-06-26 VITALS — BP 136/80 | HR 85 | Temp 97.9°F | Ht 74.0 in | Wt 180.0 lb

## 2023-06-26 DIAGNOSIS — E7849 Other hyperlipidemia: Secondary | ICD-10-CM

## 2023-06-26 DIAGNOSIS — I739 Peripheral vascular disease, unspecified: Secondary | ICD-10-CM

## 2023-06-26 DIAGNOSIS — R7303 Prediabetes: Secondary | ICD-10-CM | POA: Diagnosis not present

## 2023-06-26 DIAGNOSIS — I10 Essential (primary) hypertension: Secondary | ICD-10-CM | POA: Diagnosis not present

## 2023-06-26 DIAGNOSIS — R296 Repeated falls: Secondary | ICD-10-CM | POA: Diagnosis not present

## 2023-06-26 DIAGNOSIS — Z8673 Personal history of transient ischemic attack (TIA), and cerebral infarction without residual deficits: Secondary | ICD-10-CM

## 2023-06-26 NOTE — Assessment & Plan Note (Signed)
 Chronic Lab Results  Component Value Date   HGBA1C 5.6 12/25/2022   Low sugar / carb diet

## 2023-06-26 NOTE — Assessment & Plan Note (Signed)
 Chronic continue plavix 75 mg and atorvastatin 40 mg daily Blood pressure controlled

## 2023-06-26 NOTE — Assessment & Plan Note (Signed)
 Chronic He continues to be active Encouraged healthy diet Continue atorvastatin 40 mg daily

## 2023-06-26 NOTE — Assessment & Plan Note (Signed)
 Chronic Blood pressure controlled and has been controlled at home No changes in medication Continue to monitor BP at home Continue valsartan 80 mg daily

## 2023-06-26 NOTE — Assessment & Plan Note (Signed)
Chronic Continue Plavix 75 mg daily, atorvastatin 40 mg daily

## 2023-07-18 ENCOUNTER — Ambulatory Visit: Admitting: Internal Medicine

## 2023-07-19 ENCOUNTER — Encounter: Payer: Self-pay | Admitting: Internal Medicine

## 2023-07-19 NOTE — Progress Notes (Signed)
    Subjective:    Patient ID: Ronald Stafford, male    DOB: 05-28-1930, 89 y.o.   MRN: [id number]      HPI Ronald Stafford is here for  Chief Complaint  Patient presents with   Knee Pain    Right knee pain    Left shoulder pain - interested in getting another injection.  He fell backwards and landed on the corner of his bed and fell onto the floor - it started hurting again.    Pain in right leg/knee - has a swelling or lump on the medial aspect of the knee - been there since the cabinet hit him - no pain.  No knee pain.  Has pain in his distal right upper leg in the quad.    Lower back pain - pain across lower back.  Uses ice packs.  Laying helps.       Medications and allergies reviewed with patient and updated if appropriate.  Current Outpatient Medications on File Prior to Visit  Medication Sig Dispense Refill   acetaminophen  (TYLENOL ) 500 MG tablet Take 1,000 mg by mouth every 6 (six) hours as needed for mild pain.     atorvastatin  (LIPITOR) 40 MG tablet Take 1 tablet (40 mg total) by mouth daily. 90 tablet 2   clopidogrel  (PLAVIX ) 75 MG tablet Take 1 tablet (75 mg total) by mouth every morning. 90 tablet 2   ipratropium-albuterol  (DUONEB) 0.5-2.5 (3) MG/3ML SOLN Take 3 mLs by nebulization every 6 (six) hours as needed (shortness of breath).     Omega-3 Fatty Acids (FISH OIL) 500 MG CAPS Take 500 mg by mouth daily.     tamsulosin  (FLOMAX ) 0.4 MG CAPS capsule Take 1 capsule (0.4 mg total) by mouth daily. 30 capsule 0   valsartan  (DIOVAN ) 80 MG tablet Take 1 tablet (80 mg total) by mouth daily. 90 tablet 2   No current facility-administered medications on file prior to visit.    Review of Systems     Objective:   Vitals:   07/20/23 0850 07/20/23 0920  BP: (!) 156/80 (!) 146/90  Pulse: 88   Temp: 98.1 F (36.7 C)   SpO2: 94%    BP Readings from Last 3 Encounters:  07/20/23 (!) 170/78  07/20/23 (!) 146/90  06/26/23 136/80   Wt Readings from Last 3 Encounters:  07/20/23  175 lb (79.4 kg)  07/20/23 174 lb (78.9 kg)  06/26/23 180 lb (81.6 kg)   Body mass index is 22.34 kg/m.    Physical Exam Constitutional:      General: He is not in acute distress.    Appearance: Normal appearance. He is not ill-appearing.  HENT:     Head: Normocephalic and atraumatic.  Musculoskeletal:     Right lower leg: No edema.     Left lower leg: No edema.     Comments: No pain with palpation of right knee.  No pain with palpation of right quadricep - only hurts when he gets up from sitting and walking  Skin:    General: Skin is warm and dry.     Findings: No rash.  Neurological:     Mental Status: He is alert.            Assessment & Plan:    See Problem List for Assessment and Plan of chronic medical problems.

## 2023-07-20 ENCOUNTER — Other Ambulatory Visit: Payer: Self-pay

## 2023-07-20 ENCOUNTER — Encounter: Payer: Self-pay | Admitting: Family Medicine

## 2023-07-20 ENCOUNTER — Ambulatory Visit (INDEPENDENT_AMBULATORY_CARE_PROVIDER_SITE_OTHER): Admitting: Internal Medicine

## 2023-07-20 ENCOUNTER — Ambulatory Visit (INDEPENDENT_AMBULATORY_CARE_PROVIDER_SITE_OTHER): Admitting: Family Medicine

## 2023-07-20 VITALS — BP 170/78 | HR 93 | Ht 74.0 in | Wt 175.0 lb

## 2023-07-20 VITALS — BP 146/90 | HR 88 | Temp 98.1°F | Ht 74.0 in | Wt 174.0 lb

## 2023-07-20 DIAGNOSIS — I10 Essential (primary) hypertension: Secondary | ICD-10-CM

## 2023-07-20 DIAGNOSIS — G8929 Other chronic pain: Secondary | ICD-10-CM | POA: Diagnosis not present

## 2023-07-20 DIAGNOSIS — M25512 Pain in left shoulder: Secondary | ICD-10-CM

## 2023-07-20 DIAGNOSIS — R1031 Right lower quadrant pain: Secondary | ICD-10-CM | POA: Insufficient documentation

## 2023-07-20 NOTE — Assessment & Plan Note (Addendum)
 Chronic BP adequately controlled for age given frequent falls and possible orthostasis Continue valsartan  80 mg daily

## 2023-07-20 NOTE — Patient Instructions (Addendum)
     Make an appointment with Dr Alease Hunter downstairs -- Left shoulder pain, right quad pain    Medications changes include :   None    A referral was ordered sports medicine and someone will call you to schedule an appointment.

## 2023-07-20 NOTE — Assessment & Plan Note (Signed)
 New Having pain in right quad No knee pain Pain only getting up and walking Referral to sports medicine

## 2023-07-20 NOTE — Patient Instructions (Addendum)
 Thank you for coming in today.   You received an injection today. Seek immediate medical attention if the joint becomes red, extremely painful, or is oozing fluid.   See you back as needed.

## 2023-07-20 NOTE — Progress Notes (Signed)
    I, Miquel Amen, CMA acting as a scribe for Garlan Juniper, MD.  Ronald Stafford is a 89 y.o. male who presents to [facility] at [facility] today for left shoulder pain. Notes exacerbation of left shoulder sx over the past couple of months. Had a fall about 3 weeks ago and hit the back and left shoulder on the floor. Worsening pain with lateral raises. Denies radiating pain, n/t/w. Taking Tylenol  with minimal relief. Ambulating with a cane today.    Pertinent review of systems: No fevers or chills  Relevant historical information: PAD and hypertension.   Exam:  BP (!) 170/78   Pulse 93   Ht 6\' 2"  (1.88 m)   Wt 175 lb (79.4 kg)   SpO2 99%   BMI 22.47 kg/m  General: Well Developed, well nourished, and in no acute distress.   MSK: Left shoulder normal-appearing Limited range of motion abduction limited to about 90 degrees. Strength reduced abduction.    Lab and Radiology Results  Procedure: Real-time Ultrasound Guided Injection of left shoulder glenohumeral joint posterior approach Device: Philips Affiniti 50G/GE Logiq Images permanently stored and available for review in PACS Verbal informed consent obtained.  Discussed risks and benefits of procedure. Warned about infection, bleeding, hyperglycemia damage to structures among others. Patient expresses understanding and agreement Time-out conducted.   Noted no overlying erythema, induration, or other signs of local infection.   Skin prepped in a sterile fashion.   Local anesthesia: Topical Ethyl chloride.   With sterile technique and under real time ultrasound guidance: 40 mg of Kenalog  and 2 mL of Marcaine  injected into shoulder joint. Fluid seen entering the joint capsule.   Completed without difficulty   Pain immediately resolved suggesting accurate placement of the medication.   Advised to call if fevers/chills, erythema, induration, drainage, or persistent bleeding.   Images permanently stored and  available for review in the ultrasound unit.  Impression: Technically successful ultrasound guided injection.         Assessment and Plan: 89 y.o. male with chronic left shoulder pain due to degenerative changes and chronic rotator cuff tear.  Plan for steroid injection.  Continue home exercise program.  Check back as needed.   PDMP not reviewed this encounter. Orders Placed This Encounter  Procedures   US  LIMITED JOINT SPACE STRUCTURES UP LEFT(NO LINKED CHARGES)    Reason for Exam (SYMPTOM  OR DIAGNOSIS REQUIRED):   left shoulder pain    Preferred imaging location?:   [facility]   No orders of the defined types were placed in this encounter.    Discussed warning signs or symptoms. Please see discharge instructions. Patient expresses understanding.   The above documentation has been reviewed and is accurate and complete Garlan Juniper, M.D.

## 2023-07-20 NOTE — Assessment & Plan Note (Signed)
 Chronic Has had left shoulder pain for a while with intermittent flares related to activity or falls Would like another injection Referral ordered to sports medicine - has seen Dr Alease Hunter

## 2023-08-08 ENCOUNTER — Telehealth: Payer: Self-pay | Admitting: Internal Medicine

## 2023-08-08 DIAGNOSIS — I10 Essential (primary) hypertension: Secondary | ICD-10-CM

## 2023-08-08 NOTE — Telephone Encounter (Unsigned)
 Copied from CRM [id number]. Topic: Clinical - Medication Refill >> Aug 08, 2023  1:27 PM [place] J wrote: Medication:  atorvastatin  (LIPITOR) 40 MG tablet valsartan  (DIOVAN ) 80 MG tablet clopidogrel  (PLAVIX ) 75 MG tablet  Has the patient contacted their pharmacy? No (Agent: If no, request that the patient contact the pharmacy for the refill. If patient does not wish to contact the pharmacy document the reason why and proceed with request.) (Agent: If yes, when and what did the pharmacy advise?) Patient was scheduling an appointment and preferred to do the refills while he was still on the phone for scheduling. This is the patient's preferred pharmacy:  [facility] [address], [place] - [address] [place] [address] Phone: [phone] Fax: [phone]  Is this the correct pharmacy for this prescription? Yes If no, delete pharmacy and type the correct one.   Has the prescription been filled recently? No  Is the patient out of the medication? Yes  Has the patient been seen for an appointment in the last year OR does the patient have an upcoming appointment? Yes  Can we respond through MyChart? Yes  Agent: Please be advised that Rx refills may take up to 3 business days. We ask that you follow-up with your pharmacy.

## 2023-08-09 ENCOUNTER — Encounter: Payer: Self-pay | Admitting: Internal Medicine

## 2023-08-09 MED ORDER — VALSARTAN 80 MG PO TABS: 80.0000 mg | ORAL_TABLET | Freq: Every day | ORAL | 2 refills | Status: DC

## 2023-08-09 MED ORDER — ATORVASTATIN CALCIUM 40 MG PO TABS: 40.0000 mg | ORAL_TABLET | Freq: Every day | ORAL | 2 refills | Status: DC

## 2023-08-09 MED ORDER — CLOPIDOGREL BISULFATE 75 MG PO TABS: 75.0000 mg | ORAL_TABLET | Freq: Every morning | ORAL | 2 refills | Status: DC

## 2023-08-09 NOTE — Progress Notes (Unsigned)
    Subjective:    Patient ID: Ronald Stafford, male    DOB: December 04, 1930, 89 y.o.   MRN: [id number]      HPI Ronald Stafford is here for No chief complaint on file.   Right leg pain -      Medications and allergies reviewed with patient and updated if appropriate.  Current Outpatient Medications on File Prior to Visit  Medication Sig Dispense Refill   acetaminophen  (TYLENOL ) 500 MG tablet Take 1,000 mg by mouth every 6 (six) hours as needed for mild pain.     atorvastatin  (LIPITOR) 40 MG tablet Take 1 tablet (40 mg total) by mouth daily. 90 tablet 2   clopidogrel  (PLAVIX ) 75 MG tablet Take 1 tablet (75 mg total) by mouth every morning. 90 tablet 2   ipratropium-albuterol  (DUONEB) 0.5-2.5 (3) MG/3ML SOLN Take 3 mLs by nebulization every 6 (six) hours as needed (shortness of breath).     Omega-3 Fatty Acids (FISH OIL) 500 MG CAPS Take 500 mg by mouth daily.     tamsulosin  (FLOMAX ) 0.4 MG CAPS capsule Take 1 capsule (0.4 mg total) by mouth daily. 30 capsule 0   valsartan  (DIOVAN ) 80 MG tablet Take 1 tablet (80 mg total) by mouth daily. 90 tablet 2   No current facility-administered medications on file prior to visit.    Review of Systems     Objective:  There were no vitals filed for this visit. BP Readings from Last 3 Encounters:  07/20/23 (!) 170/78  07/20/23 (!) 146/90  06/26/23 136/80   Wt Readings from Last 3 Encounters:  07/20/23 175 lb (79.4 kg)  07/20/23 174 lb (78.9 kg)  06/26/23 180 lb (81.6 kg)   There is no height or weight on file to calculate BMI.    Physical Exam         Assessment & Plan:    See Problem List for Assessment and Plan of chronic medical problems.

## 2023-08-10 ENCOUNTER — Ambulatory Visit (INDEPENDENT_AMBULATORY_CARE_PROVIDER_SITE_OTHER): Admitting: Internal Medicine

## 2023-08-10 VITALS — BP 150/72 | HR 89 | Temp 98.3°F | Ht 74.0 in | Wt 175.0 lb

## 2023-08-10 DIAGNOSIS — M5412 Radiculopathy, cervical region: Secondary | ICD-10-CM | POA: Diagnosis not present

## 2023-08-10 DIAGNOSIS — I10 Essential (primary) hypertension: Secondary | ICD-10-CM | POA: Diagnosis not present

## 2023-08-10 DIAGNOSIS — M5416 Radiculopathy, lumbar region: Secondary | ICD-10-CM | POA: Diagnosis not present

## 2023-08-10 NOTE — Assessment & Plan Note (Signed)
 Chronic Blood pressure is elevated here today, but typically it is better controlled at home He does have some dizziness/lightheadedness so I do not want to get his blood pressure too low No changes in medication today Monitor at home Continue valsartan  80 mg daily

## 2023-08-10 NOTE — Assessment & Plan Note (Signed)
 Chronic He has chronic low back pain with intermittent radiculopathy Currently taking Tylenol , using heat and ice Has known lumbar spinal stenosis from prior MRI Discussed stronger medication to help with his symptoms, physical therapy or seeing a specialist and at this time he does not feel that is necessary Continue Tylenol , heat and ice He will let me know how if his symptoms worsen and he does want to pursue other treatment

## 2023-08-10 NOTE — Patient Instructions (Signed)
       Medications changes include :   continue the tylenol  and heat/ice

## 2023-08-10 NOTE — Assessment & Plan Note (Signed)
 Chronic He has chronic neck pain with some radiculopathy Currently taking Tylenol , using heat and ice Has known cervical spine disease from prior MRI Discussed stronger medication to help with his symptoms, physical therapy or seeing a specialist and at this time he does not feel that is necessary Continue Tylenol , heat and ice He will let me know how if his symptoms worsen and he does want to pursue other treatment

## 2023-09-03 ENCOUNTER — Ambulatory Visit (INDEPENDENT_AMBULATORY_CARE_PROVIDER_SITE_OTHER): Payer: Medicare Other

## 2023-09-03 VITALS — Ht 74.0 in | Wt 175.0 lb

## 2023-09-03 DIAGNOSIS — Z0000 Encounter for general adult medical examination without abnormal findings: Secondary | ICD-10-CM | POA: Diagnosis not present

## 2023-09-03 NOTE — Patient Instructions (Signed)
 [name] , Thank you for taking time out of your busy schedule to complete your Annual Wellness Visit with me. I enjoyed our conversation and look forward to speaking with you again next year. I, as well as your care team,  appreciate your ongoing commitment to your health goals. Please review the following plan we discussed and let me know if I can assist you in the future. Your Game plan/ To Do List   Follow up Visits: Next Medicare AWV with our clinical staff: 09/03/2024   Have you seen your provider in the last 6 months (3 months if uncontrolled diabetes)? Yes Next Office Visit with your provider: 12/28/2023 at 10:5am  Clinician Recommendations:  Aim for 30 minutes of exercise or brisk walking, 6-8 glasses of water, and 5 servings of fruits and vegetables each day. Educated and advised on getting the Shingles vaccines in 2025.      This is a list of the screening recommended for you and due dates:  Health Maintenance  Topic Date Due   Zoster (Shingles) Vaccine (1 of 2) 11/19/1982   COVID-19 Vaccine (5 - 2024-25 season) 11/12/2022   Flu Shot  10/12/2023   Medicare Annual Wellness Visit  09/02/2024   DTaP/Tdap/Td vaccine (3 - Td or Tdap) 04/04/2026   Pneumococcal Vaccine for age over 54  Completed   HPV Vaccine  Aged Out   Meningitis B Vaccine  Aged Out    Advanced directives: (Provided) Advance directive discussed with you today. I have provided a copy for you to complete at home and have notarized. Once this is complete, please bring a copy in to our office so we can scan it into your chart.  Advance Care Planning is important because it:  [x]  Makes sure you receive the medical care that is consistent with your values, goals, and preferences  [x]  It provides guidance to your family and loved ones and reduces their decisional burden about whether or not they are making the right decisions based on your wishes.  Follow the link provided in your after visit summary or read over the  paperwork we have mailed to you to help you started getting your Advance Directives in place. If you need assistance in completing these, please reach out to us  so that we can help you!

## 2023-09-03 NOTE — Progress Notes (Signed)
 Subjective:   Ronald Stafford is a 89 y.o. who presents for a Medicare Wellness preventive visit.  As a reminder, Annual Wellness Visits don't include a physical exam, and some assessments may be limited, especially if this visit is performed virtually. We may recommend an in-person follow-up visit with your provider if needed.  Visit Complete: Virtual I connected with  Ronald Stafford on 09/03/23 by a audio enabled telemedicine application and verified that I am speaking with the correct person using two identifiers.  Patient Location: Home  Provider Location: Office/Clinic  I discussed the limitations of evaluation and management by telemedicine. The patient expressed understanding and agreed to proceed.  Vital Signs: Because this visit was a virtual/telehealth visit, some criteria may be missing or patient reported. Any vitals not documented were not able to be obtained and vitals that have been documented are patient reported.  VideoDeclined- This patient declined Librarian, academic. Therefore the visit was completed with audio only.  Persons Participating in Visit: Patient.  AWV Questionnaire: No: Patient Medicare AWV questionnaire was not completed prior to this visit.  Cardiac Risk Factors include: advanced age (>47men, >83 women);dyslipidemia;hypertension;male gender     Objective:    Today's Vitals   09/03/23 1313  Weight: 175 lb (79.4 kg)  Height: 6' 2 (1.88 m)   Body mass index is 22.47 kg/m.     09/03/2023    1:12 PM 01/04/2023   11:50 AM 08/28/2022    2:50 PM 05/03/2022    2:06 PM 04/06/2022    9:55 PM 08/26/2021   11:21 AM 08/17/2020   11:24 AM  Advanced Directives  Does Patient Have a Medical Advance Directive? No No Yes Yes No Yes Yes  Type of Surveyor, minerals;Living will Living will  Healthcare Power of [place];Living will Living will  Does patient want to make changes to medical advance directive?        No - Patient declined  Copy of Healthcare Power of Attorney in Chart?   No - copy requested   No - copy requested   Would patient like information on creating a medical advance directive? Yes (MAU/Ambulatory/Procedural Areas - Information given) No - Patient declined   No - Patient declined      Current Medications (verified) Outpatient Encounter Medications as of 09/03/2023  Medication Sig   acetaminophen  (TYLENOL ) 500 MG tablet Take 1,000 mg by mouth every 6 (six) hours as needed for mild pain.   atorvastatin  (LIPITOR) 40 MG tablet Take 1 tablet (40 mg total) by mouth daily.   clopidogrel  (PLAVIX ) 75 MG tablet Take 1 tablet (75 mg total) by mouth every morning.   ipratropium-albuterol  (DUONEB) 0.5-2.5 (3) MG/3ML SOLN Take 3 mLs by nebulization every 6 (six) hours as needed (shortness of breath).   Omega-3 Fatty Acids (FISH OIL) 500 MG CAPS Take 500 mg by mouth daily.   tamsulosin  (FLOMAX ) 0.4 MG CAPS capsule Take 1 capsule (0.4 mg total) by mouth daily.   valsartan  (DIOVAN ) 80 MG tablet Take 1 tablet (80 mg total) by mouth daily.   No facility-administered encounter medications on file as of 09/03/2023.    Allergies (verified) Chicken allergy   History: Past Medical History:  Diagnosis Date   Cancer (HCC) 2004   prostate cancer, Dr. Alline   Colon polyps    Deep venous thrombosis (HCC) 1997   Postop   Depression    GERD (gastroesophageal reflux disease)    Hyperlipidemia  Hypertension    Radial head fracture, closed 09/18/2013   Shoulder fracture, left 02/23/2011   immobilized in sling , Dr Norris(no surgery)   Past Surgical History:  Procedure Laterality Date   COLONOSCOPY  2006   Negative,Dr.Edwards   colonoscopy with polypectomy      PMH of   ESOPHAGOGASTRODUODENOSCOPY N/A 02/23/2017   Procedure: ESOPHAGOGASTRODUODENOSCOPY (EGD);  Surgeon: Elicia Claw, MD;  Location: [facility];  Service: Gastroenterology;  Laterality: N/A;   JOINT REPLACEMENT  1997, 2001    Total Hip replacement X 2   LUMBAR SPINE SURGERY  09/28/2010   Dr Burnetta; for spinal stenosis   MECKEL DIVERTICULUM EXCISION     Age 6   PROSTATECTOMY  2004   w/ radiation, Dr Alline   TOTAL HIP REVISION Left 02/14/2017   Procedure: Left hip polyethylene EXCHANGE;  Surgeon: Melodi Lerner, MD;  Location: [facility];  Service: Orthopedics;  Laterality: Left;   VASECTOMY     Family History  Problem Relation Age of Onset   Prostate cancer Father    Testicular cancer Son    Social History   Socioeconomic History   Marital status: Widowed    Spouse name: Not on file   Number of children: 2   Years of education: Not on file   Highest education level: Not on file  Occupational History   Occupation: retired  Tobacco Use   Smoking status: Former    Current packs/day: 0.00    Average packs/day: 0.5 packs/day for 5.0 years (2.5 ttl pk-yrs)    Types: Cigarettes    Start date: 03/14/1983    Quit date: 03/13/1988    Years since quitting: 35.4   Smokeless tobacco: Former   Tobacco comments:    Quit at about age 74 / chewed tobacca x 1 year  Vaping Use   Vaping status: Never Used  Substance and Sexual Activity   Alcohol use: No    Alcohol/week: 0.0 standard drinks of alcohol   Drug use: No   Sexual activity: Not Currently  Other Topics Concern   Not on file  Social History Narrative   Lives alone; Wife passed 04-Dec-2012;   Died of ovarian cancer; breast cancer; other cancer;      Social Drivers of Corporate investment banker Strain: Low Risk  (09/03/2023)   Overall Financial Resource Strain (CARDIA)    Difficulty of Paying Living Expenses: Not hard at all  Food Insecurity: No Food Insecurity (09/03/2023)   Hunger Vital Sign    Worried About Running Out of Food in the Last Year: Never true    Ran Out of Food in the Last Year: Never true  Transportation Needs: No Transportation Needs (09/03/2023)   PRAPARE - Administrator, Civil Service (Medical): No    Lack of  Transportation (Non-Medical): No  Physical Activity: Insufficiently Active (09/03/2023)   Exercise Vital Sign    Days of Exercise per Week: 3 days    Minutes of Exercise per Session: 10 min  Stress: No Stress Concern Present (09/03/2023)   [employer] of Occupational Health - Occupational Stress Questionnaire    Feeling of Stress: Not at all  Social Connections: Moderately Integrated (09/03/2023)   Social Connection and Isolation Panel    Frequency of Communication with Friends and Family: More than three times a week    Frequency of Social Gatherings with Friends and Family: More than three times a week    Attends Religious Services: More than 4 times  per year    Active Member of Clubs or Organizations: Yes    Attends Banker Meetings: More than 4 times per year    Marital Status: Widowed    Tobacco Counseling Counseling given: No Tobacco comments: Quit at about age 70 / chewed tobacca x 1 year    Clinical Intake:  Pre-visit preparation completed: Yes  Pain : No/denies pain     BMI - recorded: 22.47 Nutritional Status: BMI of 19-24  Normal Nutritional Risks: None Diabetes: No  Lab Results  Component Value Date   HGBA1C 5.6 12/25/2022   HGBA1C 5.9 06/02/2021   HGBA1C 6.1 12/24/2019     How often do you need to have someone help you when you read instructions, pamphlets, or other written materials from your doctor or pharmacy?: 1 - Never  Interpreter Needed?: No  Information entered by :: Verdie Saba, CMA   Activities of Daily Living     09/03/2023    1:17 PM  In your present state of health, do you have any difficulty performing the following activities:  Hearing? 0  Vision? 0  Difficulty concentrating or making decisions? 0  Walking or climbing stairs? 0  Dressing or bathing? 0  Doing errands, shopping? 0  Preparing Food and eating ? N  Using the Toilet? N  In the past six months, have you accidently leaked urine? Y  Comment weara  depends  Do you have problems with loss of bowel control? N  Managing your Medications? N  Managing your Finances? N  Housekeeping or managing your Housekeeping? N    Patient Care Team: Geofm Glade PARAS, MD as PCP - General (Internal Medicine) Lonni Slain, MD as PCP - Cardiology (Cardiology) Alline Lenis, MD (Inactive) as Consulting Physician (Urology) Barbarann Oneil BROCKS, MD (Inactive) as Consulting Physician (Orthopedic Surgery) Fate Morna SAILOR, [facility] (Inactive) as Pharmacist (Pharmacist) Early, Krystal FALCON, MD (Inactive) as Consulting Physician (Vascular Surgery)  I have updated your Care Teams any recent Medical Services you may have received from other providers in the past year.     Assessment:   This is a routine wellness examination for [place].  Hearing/Vision screen Hearing Screening - Comments:: Wears hearing aids Vision Screening - Comments:: Wears rx glasses - plans to schedule an appt for 2025   Goals Addressed               This Visit's Progress     Patient Stated (pt-stated)        Patient stated he needs to walk more outside and will continue to use bike inside house       Depression Screen     09/03/2023    1:22 PM 08/10/2023    2:26 PM 07/20/2023    8:54 AM 06/26/2023   10:06 AM 05/14/2023    8:08 AM 12/04/2022    8:50 AM 08/28/2022    2:55 PM  PHQ 2/9 Scores  PHQ - 2 Score 0 0 0 0 0 0 0  PHQ- 9 Score 3      0    Fall Risk     09/03/2023    1:21 PM 08/10/2023    2:26 PM 07/20/2023    8:54 AM 06/26/2023   10:06 AM 05/14/2023    8:08 AM  Fall Risk   Falls in the past year? 1 1 1  0 0  Number falls in past yr: 0 0 1 0 0  Comment 4      Injury with Fall? 0  1 1 0 0  Risk for fall due to : History of fall(s);Impaired balance/gait Impaired balance/gait;History of fall(s) History of fall(s) No Fall Risks No Fall Risks  Follow up Falls evaluation completed;Falls prevention discussed Falls evaluation completed Falls evaluation completed Falls evaluation  completed Falls evaluation completed    MEDICARE RISK AT HOME:  Medicare Risk at Home Any stairs in or around the home?: Yes If so, are there any without handrails?: No Home free of loose throw rugs in walkways, pet beds, electrical cords, etc?: Yes Adequate lighting in your home to reduce risk of falls?: Yes Life alert?: No Use of a cane, walker or w/c?: Yes (cane/walker/wheelchair) Grab bars in the bathroom?: Yes Shower chair or bench in shower?: Yes Elevated toilet seat or a handicapped toilet?: Yes  TIMED UP AND GO:  Was the test performed?  No  Cognitive Function: 6CIT completed    07/19/2015   11:33 AM 07/14/2014   10:13 AM  MMSE - Mini Mental State Exam  Not completed: -- Unable to complete        09/03/2023    1:25 PM 08/28/2022    2:55 PM 06/18/2019    8:33 AM  6CIT Screen  What Year? 0 points 0 points 0 points  What month? 0 points 0 points 0 points  What time? 0 points 0 points 0 points  Count back from 20 0 points 0 points 2 points  Months in reverse 4 points 0 points 2 points  Repeat phrase 0 points 0 points 0 points  Total Score 4 points 0 points 4 points    Immunizations Immunization History  Administered Date(s) Administered   Fluad Quad(high Dose 65+) 10/31/2018, 12/24/2019   Fluad Trivalent(High Dose 65+) 12/04/2022   Influenza Split 12/20/2010, 12/13/2011   Influenza Whole 12/06/2007, 01/05/2009, 12/06/2009   Influenza, High Dose Seasonal PF 12/18/2013, 12/07/2014, 12/17/2015, 12/14/2017, 12/15/2021   Influenza,inj,Quad PF,6+ Mos 01/01/2013   Influenza,inj,quad, With Preservative 12/11/2016   Influenza-Unspecified 11/27/2016, 11/18/2020   PFIZER(Purple Top)SARS-COV-2 Vaccination 04/03/2019, 04/24/2019, 12/26/2019, 07/23/2020   Pneumococcal Conjugate-13 07/14/2014   Pneumococcal Polysaccharide-23 02/28/2013   Respiratory Syncytial Virus Vaccine,Recomb Aduvanted(Arexvy) 12/13/2021   Tdap 06/06/2005, 04/04/2016   Zoster, Live 03/01/2011     Screening Tests Health Maintenance  Topic Date Due   Zoster Vaccines- Shingrix (1 of 2) 11/19/1982   COVID-19 Vaccine (5 - 2024-25 season) 11/12/2022   INFLUENZA VACCINE  10/12/2023   Medicare Annual Wellness (AWV)  09/02/2024   DTaP/Tdap/Td (3 - Td or Tdap) 04/04/2026   Pneumococcal Vaccine: 50+ Years  Completed   HPV VACCINES  Aged Out   Meningococcal B Vaccine  Aged Out    Health Maintenance  Health Maintenance Due  Topic Date Due   Zoster Vaccines- Shingrix (1 of 2) 11/19/1982   COVID-19 Vaccine (5 - 2024-25 season) 11/12/2022   Health Maintenance Items Addressed: 09/03/23   Additional Screening:  Vision Screening: Recommended annual ophthalmology exams for early detection of glaucoma and other disorders of the eye. Would you like a referral to an eye doctor? No    Dental Screening: Recommended annual dental exams for proper oral hygiene  Community Resource Referral / Chronic Care Management: CRR required this visit?  No   CCM required this visit?  No   Plan:    I have personally reviewed and noted the following in the patient's chart:   Medical and social history Use of alcohol, tobacco or illicit drugs  Current medications and supplements including opioid prescriptions. Patient is not  currently taking opioid prescriptions. Functional ability and status Nutritional status Physical activity Advanced directives List of other physicians Hospitalizations, surgeries, and ER visits in previous 12 months Vitals Screenings to include cognitive, depression, and falls Referrals and appointments  In addition, I have reviewed and discussed with patient certain preventive protocols, quality metrics, and best practice recommendations. A written personalized care plan for preventive services as well as general preventive health recommendations were provided to patient.   Verdie CHRISTELLA Saba, CMA   09/03/2023   After Visit Summary: (MyChart) Due to this being a  telephonic visit, the after visit summary with patients personalized plan was offered to patient via MyChart   Notes: Nothing significant to report at this time.

## 2023-09-24 ENCOUNTER — Ambulatory Visit (INDEPENDENT_AMBULATORY_CARE_PROVIDER_SITE_OTHER): Admitting: Internal Medicine

## 2023-09-24 ENCOUNTER — Ambulatory Visit: Payer: Self-pay

## 2023-09-24 ENCOUNTER — Ambulatory Visit (INDEPENDENT_AMBULATORY_CARE_PROVIDER_SITE_OTHER)

## 2023-09-24 ENCOUNTER — Encounter: Payer: Self-pay | Admitting: Internal Medicine

## 2023-09-24 ENCOUNTER — Ambulatory Visit: Payer: Self-pay | Admitting: Internal Medicine

## 2023-09-24 VITALS — BP 158/90 | HR 82 | Temp 98.1°F | Resp 16 | Ht 74.0 in | Wt 172.6 lb

## 2023-09-24 DIAGNOSIS — R079 Chest pain, unspecified: Secondary | ICD-10-CM | POA: Diagnosis not present

## 2023-09-24 DIAGNOSIS — G453 Amaurosis fugax: Secondary | ICD-10-CM

## 2023-09-24 DIAGNOSIS — I10 Essential (primary) hypertension: Secondary | ICD-10-CM

## 2023-09-24 DIAGNOSIS — G4451 Hemicrania continua: Secondary | ICD-10-CM

## 2023-09-24 DIAGNOSIS — R072 Precordial pain: Secondary | ICD-10-CM | POA: Diagnosis not present

## 2023-09-24 NOTE — Progress Notes (Signed)
 Subjective:  Patient ID: Ronald Stafford, male    DOB: 12/13/1930  Age: 89 y.o. MRN: [id number]  CC: Dizziness (Patient states that he was sitting at the table at about 11am is when the dizziness started. It did it one time and hasn't done it again.)   HPI Ronald Stafford presents for f/up ----  Discussed the use of AI scribe software for clinical note transcription with the patient, who gave verbal consent to proceed.  History of Present Illness   Ronald Stafford is a 89 year old male with hypertension who presents with transient vision loss and headache.  He experienced a sudden episode of transient vision loss in both eyes at around 10:00 AM today, described as 'going blank' and having difficulty seeing. The vision loss resolved spontaneously after a few minutes. This is the first occurrence of such an event, although he has experienced dizziness in the past, which he notes is different from this episode.  He has a persistent headache located at the back of his neck that has been present throughout the event. He denies nausea, vomiting, numbness, or weakness, but mentions experiencing some tingling.  He mentions experiencing chest pain for the past two weeks, described as a 'burning' sensation in the left chest area. No other associated symptoms with the chest pain.  He has a history of CVA and is currently taking Plavix .       Outpatient Medications Prior to Visit  Medication Sig Dispense Refill   acetaminophen  (TYLENOL ) 500 MG tablet Take 1,000 mg by mouth every 6 (six) hours as needed for mild pain.     atorvastatin  (LIPITOR) 40 MG tablet Take 1 tablet (40 mg total) by mouth daily. 90 tablet 2   clopidogrel  (PLAVIX ) 75 MG tablet Take 1 tablet (75 mg total) by mouth every morning. 90 tablet 2   ipratropium-albuterol  (DUONEB) 0.5-2.5 (3) MG/3ML SOLN Take 3 mLs by nebulization every 6 (six) hours as needed (shortness of breath).     Omega-3 Fatty Acids (FISH OIL) 500 MG CAPS Take 500 mg by  mouth daily.     tamsulosin  (FLOMAX ) 0.4 MG CAPS capsule Take 1 capsule (0.4 mg total) by mouth daily. 30 capsule 0   valsartan  (DIOVAN ) 80 MG tablet Take 1 tablet (80 mg total) by mouth daily. 90 tablet 2   No facility-administered medications prior to visit.    ROS Review of Systems  Constitutional:  Negative for appetite change, chills, diaphoresis, fatigue and fever.  HENT: Negative.    Eyes:  Positive for visual disturbance. Negative for photophobia and redness.  Respiratory: Negative.  Negative for cough, chest tightness, shortness of breath and wheezing.   Cardiovascular:  Positive for chest pain. Negative for palpitations and leg swelling.  Gastrointestinal: Negative.  Negative for abdominal pain, constipation, diarrhea, nausea and vomiting.  Genitourinary: Negative.  Negative for difficulty urinating, dysuria and hematuria.  Musculoskeletal: Negative.   Skin: Negative.   Neurological:  Positive for dizziness and headaches. Negative for facial asymmetry and weakness.  Hematological:  Negative for adenopathy. Does not bruise/bleed easily.    Objective:  BP (!) 158/90 (BP Location: Left Arm, Patient Position: Sitting, Cuff Size: Normal)   Pulse 82   Temp 98.1 F (36.7 C) (Oral)   Resp 16   Ht 6' 2 (1.88 m)   Wt 172 lb 9.6 oz (78.3 kg)   SpO2 95%   BMI 22.16 kg/m   BP Readings from Last 3 Encounters:  09/24/23 (!) 158/90  08/10/23 (!) 150/72  07/20/23 (!) 170/78    Wt Readings from Last 3 Encounters:  09/24/23 172 lb 9.6 oz (78.3 kg)  09/03/23 175 lb (79.4 kg)  08/10/23 175 lb (79.4 kg)    Physical Exam Vitals reviewed.  Constitutional:      Appearance: Normal appearance.  HENT:     Nose: Nose normal.     Mouth/Throat:     Mouth: Mucous membranes are moist.  Eyes:     General: No scleral icterus.    Extraocular Movements: Extraocular movements intact.     Pupils: Pupils are equal, round, and reactive to light.  Cardiovascular:     Rate and Rhythm:  Normal rate and regular rhythm.     Heart sounds: No murmur heard.    No friction rub. No gallop.     Comments: EKG--- NSR, 73 bpm No LVH, Q waves, or ST/T wave changes  Pulmonary:     Breath sounds: No stridor. No wheezing, rhonchi or rales.  Abdominal:     General: Abdomen is flat.     Palpations: There is no mass.     Tenderness: There is no abdominal tenderness. There is no guarding.     Hernia: No hernia is present.  Musculoskeletal:     Cervical back: Neck supple.     Right lower leg: No edema.     Left lower leg: No edema.  Lymphadenopathy:     Cervical: No cervical adenopathy.  Skin:    General: Skin is warm and dry.     Findings: No rash.  Neurological:     General: No focal deficit present.     Mental Status: He is alert. Mental status is at baseline.  Psychiatric:        Mood and Affect: Mood normal.        Behavior: Behavior normal.     Lab Results  Component Value Date   WBC 7.9 05/14/2023   HGB 14.2 05/14/2023   HCT 42.5 05/14/2023   PLT 177.0 05/14/2023   GLUCOSE 95 05/14/2023   CHOL 103 12/25/2022   TRIG 85.0 12/25/2022   HDL 41.10 12/25/2022   LDLCALC 45 12/25/2022   ALT 20 05/14/2023   AST 21 05/14/2023   NA 145 05/14/2023   K 4.1 05/14/2023   CL 106 05/14/2023   CREATININE 0.92 05/14/2023   BUN 16 05/14/2023   CO2 31 05/14/2023   TSH 3.23 10/31/2018   PSA 0.01 (L) 05/14/2023   INR 1.1 04/07/2022   HGBA1C 5.6 12/25/2022    CT RENAL STONE STUDY Result Date: 05/16/2023 CLINICAL DATA:  Abdominal flank pain renal stone protocol EXAM: CT ABDOMEN AND PELVIS WITHOUT CONTRAST TECHNIQUE: Multidetector CT imaging of the abdomen and pelvis was performed following the standard protocol without IV contrast. RADIATION DOSE REDUCTION: This exam was performed according to the departmental dose-optimization program which includes automated exposure control, adjustment of the mA and/or kV according to patient size and/or use of iterative reconstruction  technique. COMPARISON:  CT abdomen and pelvis April 06, 2022 FINDINGS: Lower chest: No acute abnormality. Hepatobiliary: No focal liver abnormality is seen. No gallstones, gallbladder wall thickening, or biliary dilatation. Pancreas: Unremarkable. No pancreatic ductal dilatation or surrounding inflammatory changes. Spleen: Normal in size without focal abnormality. Adrenals/Urinary Tract: Adrenal glands are unremarkable. Kidneys are normal, without renal calculi, focal lesion, or hydronephrosis. Bladder is unremarkable. 4.3 by 3.0 cm exophytic cortical cyst anterior cortical lip of the left mid kidney. 4.7 x 4.4 cm exophytic cortical  cyst posterior cortical margin of the left kidney. The large majority of Bosniak II masses are benign. When malignant, nearly all are indolent. Generally, Bosniak II masses are followed by imaging at 6 months and 12 months (MRI preferred over CT), then annually for a total of 5 years to assess for morphologic change. (Reference: Bosniak Classification of Cystic Renal Masses, Version 2019. Radiology 2019; 292(2): [phone].) No evidence of lithiasis or hydronephrosis. Both ureters are normal without evidence of ureterolithiasis. Bladder appears normal in size with a possible 1 mm calcification in the dependent portion of the bladder may correlate with recently passed kidney stone. Stomach/Bowel: Stomach is within normal limits. Appendix appears normal. No evidence of bowel wall thickening, distention, or inflammatory changes. Vascular/Lymphatic: No significant vascular findings are present. No enlarged abdominal or pelvic lymph nodes. Reproductive: Prostate is unremarkable. Other: No abdominal wall hernia or abnormality. No abdominopelvic ascites. Musculoskeletal: Multilevel degenerative disc disease particularly L3-4 L4-5 with prior laminectomy L4 level IMPRESSION: *No evidence of nephrolithiasis or hydronephrosis. *Possible 1 mm calcification in the dependent portion of the bladder may  correlate with recently passed kidney stone. *Left renal cysts. *Multilevel degenerative disc disease particularly L3-4 L4-5 with prior laminectomy L4 level. Electronically Signed   By: Franky Chard M.D.   On: 05/16/2023 14:41    Assessment & Plan:  Precordial pain- EKG and Chest xray are reassuring. -     EKG 12-Lead -     Brain natriuretic peptide; Future -     Troponin I (High Sensitivity); Future -     D-dimer, quantitative; Future -     DG Chest 2 View; Future  Essential hypertension- BP is adequately well controlled. 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 -     C-reactive protein; Future -     MR ANGIO HEAD WO W CONTRAST; Future -     MR BRAIN WO CONTRAST; Future  Bilateral amaurosis fugax- Will evaluate for CVA -     MR ANGIO NECK W WO CONTRAST; Future -     MR ANGIO HEAD WO W CONTRAST; Future -     MR BRAIN WO CONTRAST; Future     Follow-up: Return if symptoms worsen or fail to improve.  Debby Molt, MD

## 2023-09-24 NOTE — Telephone Encounter (Signed)
 FYI Only or Action Required?: FYI only for provider.  Patient was last seen in primary care on 08/10/2023 by Geofm Glade PARAS, MD.  Called Nurse Triage reporting Dizziness.  Symptoms began yesterday.  Interventions attempted: Rest, hydration, or home remedies.  Symptoms are: unchanged.  Triage Disposition: See Physician Within 24 Hours  Patient/caregiver understands and will follow disposition?: Yes  Appt scheduled for today at 320 with Dr. Joshua. Pt going to call son to see if he can bring him in.   Copied from CRM [id number]. Topic: Clinical - Red Word Triage >> Sep 24, 2023 11:57 AM [place] A wrote: [facility] that prompted transfer to Nurse Triage: Patient said that he was just fine then all of a sudden he is very dizzy Reason for Disposition  [1] MODERATE dizziness (e.g., interferes with normal activities) AND [2] has NOT been evaluated by doctor (or NP/PA) for this  (Exception: Dizziness caused by heat exposure, sudden standing, or poor fluid intake.)  Answer Assessment - Initial Assessment Questions 1. DESCRIPTION: Describe your dizziness.     Feeling lightheaded and weak 2. LIGHTHEADED: Do you feel lightheaded? (e.g., somewhat faint, woozy, weak upon standing)     yes 4. SEVERITY: How bad is it?  Do you feel like you are going to faint? Can you stand and walk?     Mild to moderate, having to use walker to walk 5. ONSET:  When did the dizziness begin?     Last night   8. CAUSE: What do you think is causing the dizziness? (e.g., decreased fluids or food, diarrhea, emotional distress, heat exposure, new medicine, sudden standing, vomiting; unknown)     Unsure  10. OTHER SYMPTOMS: Do you have any other symptoms? (e.g., fever, chest pain, vomiting, diarrhea, bleeding)       SOB currently  Protocols used: Dizziness - Lightheadedness-A-AH

## 2023-09-24 NOTE — Patient Instructions (Signed)
Nonspecific Chest Pain, Adult Chest pain is an uncomfortable, tight, or painful feeling in the chest. The pain can feel like a crushing, aching, or squeezing pressure. A person can feel a burning or tingling sensation. Chest pain can also be felt in your back, neck, jaw, shoulder, or arm. This pain can be worse when you move, sneeze, or take a deep breath. Chest pain can be caused by a condition that is life-threatening. This must be treated right away. It can also be caused by something that is not life-threatening. If you have chest pain, it can be hard to know the difference, so it is important to get help right away to make sure that you do not have a serious condition. Some life-threatening causes of chest pain include: Heart attack. A tear in the body's main blood vessel (aortic dissection). Inflammation around your heart (pericarditis). A problem in the lungs, such as a blood clot (pulmonary embolism) or a collapsed lung (pneumothorax). Some non life-threatening causes of chest pain include: Heartburn. Anxiety or stress. Damage to the bones, muscles, and cartilage that make up your chest wall. Pneumonia or bronchitis. Shingles infection (varicella-zoster virus). Your chest pain may come and go. It may also be constant. Your health care provider will do tests and other studies to find the cause of your pain. Treatment will depend on the cause of your chest pain. Follow these instructions at home: Medicines Take over-the-counter and prescription medicines only as told by your health care provider. If you were prescribed an antibiotic medicine, take it as told by your health care provider. Do not stop taking the antibiotic even if you start to feel better. Activity Avoid any activities that cause chest pain. Do not lift anything that is heavier than 10 lb (4.5 kg), or the limit that you are told, until your health care provider says that it is safe. Rest as directed by your health care  provider. Return to your normal activities only as told by your health care provider. Ask your health care provider what activities are safe for you. Lifestyle     Do not use any products that contain nicotine or tobacco, such as cigarettes, e-cigarettes, and chewing tobacco. If you need help quitting, ask your health care provider. Do not drink alcohol. Make healthy lifestyle changes as recommended. These may include: Getting regular exercise. Ask your health care provider to suggest some exercises that are safe for you. Eating a heart-healthy diet. This includes plenty of fresh fruits and vegetables, whole grains, low-fat (lean) protein, and low-fat dairy products. A dietitian can help you find healthy eating options. Maintaining a healthy weight. Managing any other health conditions you may have, such as high blood pressure (hypertension) or diabetes. Reducing stress, such as with yoga or relaxation techniques. General instructions Pay attention to any changes in your symptoms. It is up to you to get the results of any tests that were done. Ask your health care provider, or the department that is doing the tests, when your results will be ready. Keep all follow-up visits as told by your health care provider. This is important. You may be asked to go for further testing if your chest pain does not go away. Contact a health care provider if: Your chest pain does not go away. You feel depressed. You have a fever. You notice changes in your symptoms or develop new symptoms. Get help right away if: Your chest pain gets worse. You have a cough that gets worse, or you  cough up blood. You have severe pain in your abdomen. You faint. You have sudden, unexplained chest discomfort. You have sudden, unexplained discomfort in your arms, back, neck, or jaw. You have shortness of breath at any time. You suddenly start to sweat, or your skin gets clammy. You feel nausea or you vomit. You  suddenly feel lightheaded or dizzy. You have severe weakness, or unexplained weakness or fatigue. Your heart begins to beat quickly, or it feels like it is skipping beats. These symptoms may represent a serious problem that is an emergency. Do not wait to see if the symptoms will go away. Get medical help right away. Call your local emergency services (911 in the [place] Do not drive yourself to the hospital. Summary Chest pain can be caused by a condition that is serious and requires urgent treatment. It may also be caused by something that is not life-threatening. Your health care provider may do lab tests and other studies to find the cause of your pain. Follow your health care provider's instructions on taking medicines, making lifestyle changes, and getting emergency treatment if symptoms become worse. Keep all follow-up visits as told by your health care provider. This includes visits for any further testing if your chest pain does not go away. This information is not intended to replace advice given to you by your health care provider. Make sure you discuss any questions you have with your health care provider. Document Revised: 01/12/2022 Document Reviewed: 01/12/2022 Elsevier Patient Education  2024 [employer].

## 2023-09-25 DIAGNOSIS — G4451 Hemicrania continua: Secondary | ICD-10-CM | POA: Insufficient documentation

## 2023-09-25 DIAGNOSIS — G453 Amaurosis fugax: Secondary | ICD-10-CM | POA: Insufficient documentation

## 2023-09-27 ENCOUNTER — Telehealth: Payer: Self-pay

## 2023-09-27 NOTE — Telephone Encounter (Signed)
 Spoke with patient and he will come by in the morning for labs.

## 2023-09-27 NOTE — Telephone Encounter (Signed)
-----   Message from Debby Molt sent at 09/27/2023 12:01 PM EDT ----- Regarding: labs Will you ask him to come back to the lab?

## 2023-10-01 ENCOUNTER — Other Ambulatory Visit (INDEPENDENT_AMBULATORY_CARE_PROVIDER_SITE_OTHER)

## 2023-10-01 DIAGNOSIS — G4451 Hemicrania continua: Secondary | ICD-10-CM

## 2023-10-01 DIAGNOSIS — R072 Precordial pain: Secondary | ICD-10-CM | POA: Diagnosis not present

## 2023-10-01 DIAGNOSIS — I10 Essential (primary) hypertension: Secondary | ICD-10-CM

## 2023-10-01 LAB — BASIC METABOLIC PANEL WITH GFR
BUN: 15 mg/dL (ref 6–23)
CO2: 30 meq/L (ref 19–32)
Calcium: 9.6 mg/dL (ref 8.4–10.5)
Chloride: 107 meq/L (ref 96–112)
Creatinine, Ser: 0.82 mg/dL (ref 0.40–1.50)
GFR: 77.14 mL/min (ref >60.00)
Glucose, Bld: 99 mg/dL (ref 70–99)
Potassium: 3.8 meq/L (ref 3.5–5.1)
Sodium: 145 meq/L (ref 135–145)

## 2023-10-01 LAB — CBC WITH DIFFERENTIAL/PLATELET
Basophils Absolute: 0.2 K/uL — ABNORMAL HIGH (ref 0.0–0.1)
Basophils Relative: 3.7 % — ABNORMAL HIGH (ref 0.0–3.0)
Eosinophils Absolute: 0.1 K/uL (ref 0.0–0.7)
Eosinophils Relative: 1.8 % (ref 0.0–5.0)
HCT: 37.8 % — ABNORMAL LOW (ref 39.0–52.0)
Hemoglobin: 12.4 g/dL — ABNORMAL LOW (ref 13.0–17.0)
Lymphocytes Relative: 15.1 % (ref 12.0–46.0)
Lymphs Abs: 0.7 K/uL (ref 0.7–4.0)
MCHC: 32.8 g/dL (ref 30.0–36.0)
MCV: 85.6 fl (ref 78.0–100.0)
Monocytes Absolute: 0.8 K/uL (ref 0.1–1.0)
Monocytes Relative: 17.5 % — ABNORMAL HIGH (ref 3.0–12.0)
Neutro Abs: 2.8 K/uL (ref 1.4–7.7)
Neutrophils Relative %: 61.9 % (ref 43.0–77.0)
Platelets: 139 K/uL — ABNORMAL LOW (ref 150.0–400.0)
RBC: 4.42 Mil/uL (ref 4.22–5.81)
RDW: 16.1 % — ABNORMAL HIGH (ref 11.5–15.5)
WBC: 4.5 K/uL (ref 4.0–10.5)

## 2023-10-01 LAB — BRAIN NATRIURETIC PEPTIDE: Pro B Natriuretic peptide (BNP): 54 pg/mL (ref 0.0–100.0)

## 2023-10-01 LAB — C-REACTIVE PROTEIN: CRP: <1 mg/dL (ref 0.5–20.0)

## 2023-10-01 LAB — TSH: TSH: 2.1 u[IU]/mL (ref 0.35–5.50)

## 2023-10-01 LAB — TROPONIN I (HIGH SENSITIVITY): High Sens Troponin I: 7 ng/L (ref 2–17)

## 2023-10-01 LAB — D-DIMER, QUANTITATIVE: D-Dimer, Quant: 0.88 ug{FEU}/mL — ABNORMAL HIGH (ref <0.50)

## 2023-10-23 ENCOUNTER — Ambulatory Visit
Admission: RE | Admit: 2023-10-23 | Discharge: 2023-10-23 | Disposition: A | Discharge status: Home / Self Care | Source: Ambulatory Visit | Attending: Internal Medicine | Admitting: Internal Medicine

## 2023-10-23 ENCOUNTER — Telehealth: Payer: Self-pay

## 2023-10-23 DIAGNOSIS — G453 Amaurosis fugax: Secondary | ICD-10-CM

## 2023-10-23 DIAGNOSIS — G4451 Hemicrania continua: Secondary | ICD-10-CM

## 2023-10-23 DIAGNOSIS — I6522 Occlusion and stenosis of left carotid artery: Secondary | ICD-10-CM | POA: Diagnosis not present

## 2023-10-23 DIAGNOSIS — I63311 Cerebral infarction due to thrombosis of right middle cerebral artery: Secondary | ICD-10-CM | POA: Diagnosis not present

## 2023-10-23 MED ORDER — GADOPICLENOL 0.5 MMOL/ML IV SOLN
8.0000 mL | Freq: Once | INTRAVENOUS | Status: AC | PRN
  Administered 2023-10-23 (×2): 8 mL via INTRAVENOUS

## 2023-10-23 NOTE — Telephone Encounter (Signed)
 Copied from CRM #[id number]. Topic: General - Other >> Oct 23, 2023  3:04 PM Mesmerise C wrote: Reason for CRM: Diane from [facility] confirming MRI head report wanted to convey Dr. Geofm views impression 1 on the report

## 2023-11-02 ENCOUNTER — Other Ambulatory Visit: Payer: Self-pay

## 2023-11-02 ENCOUNTER — Emergency Department (HOSPITAL_COMMUNITY)
Admission: EM | Admit: 2023-11-02 | Discharge: 2023-11-02 | Disposition: A | Discharge status: Home / Self Care | Attending: Emergency Medicine | Admitting: Emergency Medicine

## 2023-11-02 ENCOUNTER — Ambulatory Visit: Payer: Self-pay

## 2023-11-02 ENCOUNTER — Emergency Department (HOSPITAL_COMMUNITY)

## 2023-11-02 ENCOUNTER — Encounter (HOSPITAL_COMMUNITY): Payer: Self-pay | Admitting: Emergency Medicine

## 2023-11-02 DIAGNOSIS — Z8546 Personal history of malignant neoplasm of prostate: Secondary | ICD-10-CM | POA: Diagnosis not present

## 2023-11-02 DIAGNOSIS — I10 Essential (primary) hypertension: Secondary | ICD-10-CM | POA: Insufficient documentation

## 2023-11-02 DIAGNOSIS — Z79899 Other long term (current) drug therapy: Secondary | ICD-10-CM | POA: Diagnosis not present

## 2023-11-02 DIAGNOSIS — R0602 Shortness of breath: Secondary | ICD-10-CM | POA: Diagnosis not present

## 2023-11-02 DIAGNOSIS — R079 Chest pain, unspecified: Secondary | ICD-10-CM | POA: Diagnosis not present

## 2023-11-02 DIAGNOSIS — R0789 Other chest pain: Secondary | ICD-10-CM | POA: Diagnosis not present

## 2023-11-02 LAB — BASIC METABOLIC PANEL WITH GFR
Anion gap: 8 (ref 5–15)
BUN: 13 mg/dL (ref 8–23)
CO2: 27 mmol/L (ref 22–32)
Calcium: 9.4 mg/dL (ref 8.9–10.3)
Chloride: 112 mmol/L — ABNORMAL HIGH (ref 98–111)
Creatinine, Ser: 0.88 mg/dL (ref 0.61–1.24)
GFR, Estimated: >60 mL/min (ref >60)
Glucose, Bld: 103 mg/dL — ABNORMAL HIGH (ref 70–99)
Potassium: 3.9 mmol/L (ref 3.5–5.1)
Sodium: 147 mmol/L — ABNORMAL HIGH (ref 135–145)

## 2023-11-02 LAB — CBC
HCT: 37.9 % — ABNORMAL LOW (ref 39.0–52.0)
Hemoglobin: 11.8 g/dL — ABNORMAL LOW (ref 13.0–17.0)
MCH: 28.2 pg (ref 26.0–34.0)
MCHC: 31.1 g/dL (ref 30.0–36.0)
MCV: 90.5 fL (ref 80.0–100.0)
Platelets: 132 K/uL — ABNORMAL LOW (ref 150–400)
RBC: 4.19 MIL/uL — ABNORMAL LOW (ref 4.22–5.81)
RDW: 15.5 % (ref 11.5–15.5)
WBC: 4.3 K/uL (ref 4.0–10.5)
nRBC: 0 % (ref 0.0–0.2)

## 2023-11-02 LAB — TROPONIN I (HIGH SENSITIVITY)
Troponin I (High Sensitivity): 7 ng/L (ref <18)
Troponin I (High Sensitivity): 8 ng/L (ref <18)

## 2023-11-02 LAB — D-DIMER, QUANTITATIVE: D-Dimer, Quant: 0.67 ug{FEU}/mL — ABNORMAL HIGH (ref 0.00–0.50)

## 2023-11-02 MED ORDER — IRBESARTAN 75 MG PO TABS
75.0000 mg | ORAL_TABLET | Freq: Every day | ORAL | Status: DC
  Administered 2023-11-02: 75 mg via ORAL
  Filled 2023-11-02: qty 1

## 2023-11-02 NOTE — ED Provider Notes (Signed)
 [place] EMERGENCY DEPARTMENT AT [facility] Provider Note   CSN: [id number] Arrival date & time: 11/02/23  1536     Patient presents with: Chest Pain   Ronald Stafford is a 89 y.o. male patient with history of DVT, hypertension, hyperlipidemia who presents to the emergency department today for further evaluation of left-sided chest pain.  Poorly characterized but nonradiating.  Symptoms started around 1 PM.  Resolved with 325 of aspirin  and nitroglycerin  given by EMS.  He states he has had similar symptoms in the past.  He reports associated shortness of breath but that is since resolved.  No recent illness, fever, chills, leg pain, leg swelling.  Patient's completely asymptomatic currently.     Chest Pain      Prior to Admission medications   Medication Sig Start Date End Date Taking? Authorizing Provider  acetaminophen  (TYLENOL ) 500 MG tablet Take 1,000 mg by mouth every 6 (six) hours as needed for mild pain.    [provider]  atorvastatin  (LIPITOR) 40 MG tablet Take 1 tablet (40 mg total) by mouth daily. 08/09/23   Geofm Glade PARAS, MD  clopidogrel  (PLAVIX ) 75 MG tablet Take 1 tablet (75 mg total) by mouth every morning. 08/09/23   Burns, Glade PARAS, MD  ipratropium-albuterol  (DUONEB) 0.5-2.5 (3) MG/3ML SOLN Take 3 mLs by nebulization every 6 (six) hours as needed (shortness of breath).    [provider]  Omega-3 Fatty Acids (FISH OIL) 500 MG CAPS Take 500 mg by mouth daily.    [provider]  tamsulosin  (FLOMAX ) 0.4 MG CAPS capsule Take 1 capsule (0.4 mg total) by mouth daily. 05/17/23   Geofm Glade PARAS, MD  valsartan  (DIOVAN ) 80 MG tablet Take 1 tablet (80 mg total) by mouth daily. 08/09/23   Geofm Glade PARAS, MD    Allergies: Chicken allergy    Review of Systems  Cardiovascular:  Positive for chest pain.  All other systems reviewed and are negative.   Updated Vital Signs BP (!) 193/76   Pulse 93   Temp (!) 97.5 F (36.4 C) (Oral)   Resp 17    SpO2 98%   Physical Exam Vitals and nursing note reviewed.  Constitutional:      General: He is not in acute distress.    Appearance: Normal appearance.  HENT:     Head: Normocephalic and atraumatic.  Eyes:     General:        Right eye: No discharge.        Left eye: No discharge.  Cardiovascular:     Comments: Regular rate and rhythm.  S1/S2 are distinct without any evidence of murmur, rubs, or gallops.  Radial pulses are 2+ bilaterally.  Dorsalis pedis pulses are 2+ bilaterally.  No evidence of pedal edema. Pulmonary:     Comments: Clear to auscultation bilaterally.  Normal effort.  No respiratory distress.  No evidence of wheezes, rales, or rhonchi heard throughout. Abdominal:     General: Abdomen is flat. Bowel sounds are normal. There is no distension.     Tenderness: There is no abdominal tenderness. There is no guarding or rebound.  Musculoskeletal:        General: Normal range of motion.     Cervical back: Neck supple.  Skin:    General: Skin is warm and dry.     Findings: No rash.  Neurological:     General: No focal deficit present.     Mental Status: He is alert.  Psychiatric:  Mood and Affect: Mood normal.        Behavior: Behavior normal.     (all labs ordered are listed, but only abnormal results are displayed) Labs Reviewed  BASIC METABOLIC PANEL WITH GFR - Abnormal; Notable for the following components:      Result Value   Sodium 147 (*)    Chloride 112 (*)    Glucose, Bld 103 (*)    All other components within normal limits  CBC - Abnormal; Notable for the following components:   RBC 4.19 (*)    Hemoglobin 11.8 (*)    HCT 37.9 (*)    Platelets 132 (*)    All other components within normal limits  D-DIMER, QUANTITATIVE - Abnormal; Notable for the following components:   D-Dimer, Quant 0.67 (*)    All other components within normal limits  TROPONIN I (HIGH SENSITIVITY)  TROPONIN I (HIGH SENSITIVITY)    EKG: EKG  Interpretation Date/Time:  Friday November 02 2023 15:49:03 EDT Ventricular Rate:  99 PR Interval:  189 QRS Duration:  99 QT Interval:  356 QTC Calculation: 450 R Axis:   112  Text Interpretation: Sinus rhythm Multiple premature complexes, vent & supraven Consider right atrial enlargement Probable lateral infarct, age indeterminate Probable anterior infarct, old when compared top rior, more premature beats. No STEMI Confirmed by Ginger Barefoot ([id number]) on 11/02/2023 4:29:10 PM  Radiology: DG Chest 2 View Result Date: 11/02/2023 CLINICAL DATA:  Chest pain, hypertension EXAM: CHEST - 2 VIEW COMPARISON:  None Available. FINDINGS: Normal mediastinum and cardiac silhouette. Normal pulmonary vasculature. No evidence of effusion, infiltrate, or pneumothorax. No acute bony abnormality. IMPRESSION: No acute cardiopulmonary process. Electronically Signed   By: Jackquline Boxer M.D.   On: 11/02/2023 16:45    Procedures   Medications Ordered in the ED  irbesartan  (AVAPRO ) tablet 75 mg (has no administration in time range)     Medical Decision Making Ronald Stafford is a 89 y.o. male patient who presents to the emergency department today for further evaluation of chest pain. Exam without evidence of volume overload so doubt heart failure. EKG without signs of active ischemia. Given the timing of pain to ER presentation, single troponin normal, delta troponin normal so doubt NSTEMI. Presentation not consistent with acute PE (ddimer within a 70 ge adjustment) we will do dimer again,pneumothorax (not visualized on chest xr), thoracic aortic dissection, pericarditis, tamponade, pneumonia (no infectious symptoms, clear chest xr), myocarditis (no recent illness, neg trop). HEART score: 5 so discharge patient home with PMD follow up with cardiology on 9/2 given he is completely asymptomatic.    Amount and/or Complexity of Data Reviewed Labs: ordered. Radiology: ordered.  Risk Prescription drug  management.     Final diagnoses:  Chest pain, unspecified type    ED Discharge Orders     None          Ronald Stafford 11/02/23 2003    Tegeler, Lonni PARAS, MD 11/02/23 2037

## 2023-11-02 NOTE — Telephone Encounter (Signed)
 Triaged to ED via 911/EMS. This RN stayed on the telephone until EMS arrival on scene.  FYI Only or Action Required?: FYI only for provider.  Patient was last seen in primary care on 09/24/2023 by Joshua Debby CROME, MD.  Called Nurse Triage reporting Triage.  Symptoms began today.  Interventions attempted: Nothing.  Symptoms are: gradually worsening.  Triage Disposition: Call EMS 911 Now  Patient/caregiver understands and will follow disposition?: Yes Reason for Disposition  Sounds like a life-threatening emergency to the triager  Answer Assessment - Initial Assessment Questions 89 yr old male was transferred to this RN for telephone triage. He reports one hour hx of chest pain and SOB. This RN does not hear obvious wheezing, but patient is speaking in short phrases. When asked about the quality/rating of his chest pain he stated its just hurting.   With patient's permission, this RN contacted 911 and provided dispatch with his address, c/c and relevant information. This RN returned to patient call and asked that he unlock his door and gather his medications and necessary belongings if he feels safe to do so. This RN remained on the phone with the patient until EMS arrival.   Triage questions were kept limited d/t the level of the patient's shortness of breath.  1. LOCATION: Where does it hurt?       Center  3. ONSET: When did the chest pain begin? (Minutes, hours or days)      One hour ago (approcimately 1330 today 11/02/23)  4. PATTERN: Does the pain come and go, or has it been constant since it started?  Does it get worse with exertion?      Constant  5. DURATION: How long does it last (e.g., seconds, minutes, hours)     Constant 1 hour  6. SEVERITY: How bad is the pain?  (e.g., Scale 1-10; mild, moderate, or severe)     Hurting  7. CARDIAC RISK FACTORS: Do you have any history of heart problems or risk factors for heart disease? (e.g., angina, prior heart  attack; diabetes, high blood pressure, high cholesterol, smoker, or strong family history of heart disease)     Carotid arterial disease, HTN  Protocols used: Chest Pain-A-AH Copied from CRM #[id number]. Topic: Clinical - Red Word Triage >> Nov 02, 2023  2:29 PM Deaijah H wrote: Red Word that prompted transfer to Nurse Triage: Returning call to a nurse for CT result but also stated he's having SOB, hurting in chest

## 2023-11-02 NOTE — Discharge Instructions (Signed)
 Please follow-up with cardiology on 9/2.  I would call to confirm the appointment.  Please take your blood pressure medications.  You should have plenty of refills based on my records.  You may return to the emergency department for any worsening symptoms.

## 2023-11-02 NOTE — ED Triage Notes (Addendum)
 Pt [facility]b [facility] from home for 7/10 CP x 1 hr with SOB. Hx of HTN and prostate cancer.  Forgot to take meds today.   Responded to 324 ASA, 0.4 nitroglycerin   Prior to nitroglycerin , Hypertensive 220/110 HR 110 95% 2L, CBG 130, SBP 140's after nitro  18G L. FA

## 2023-11-02 NOTE — ED Notes (Signed)
 Patient transported to X-ray

## 2023-11-13 ENCOUNTER — Ambulatory Visit (INDEPENDENT_AMBULATORY_CARE_PROVIDER_SITE_OTHER): Admitting: Family

## 2023-11-13 ENCOUNTER — Encounter (HOSPITAL_BASED_OUTPATIENT_CLINIC_OR_DEPARTMENT_OTHER): Payer: Self-pay | Admitting: Family

## 2023-11-13 ENCOUNTER — Other Ambulatory Visit (HOSPITAL_COMMUNITY): Payer: Self-pay

## 2023-11-13 VITALS — BP 136/68 | HR 93 | Ht 74.5 in | Wt 169.4 lb

## 2023-11-13 DIAGNOSIS — I2510 Atherosclerotic heart disease of native coronary artery without angina pectoris: Secondary | ICD-10-CM

## 2023-11-13 DIAGNOSIS — I10 Essential (primary) hypertension: Secondary | ICD-10-CM | POA: Diagnosis not present

## 2023-11-13 MED ORDER — CLOPIDOGREL BISULFATE 75 MG PO TABS
75.0000 mg | ORAL_TABLET | Freq: Every morning | ORAL | 2 refills | Status: AC
  Filled 2023-11-13 – 2023-11-20 (×2): qty 90, 90d supply, fill #0
  Filled 2024-02-20: qty 90, 90d supply, fill #1

## 2023-11-13 MED ORDER — VALSARTAN 80 MG PO TABS
80.0000 mg | ORAL_TABLET | Freq: Every day | ORAL | 2 refills | Status: AC
  Filled 2023-11-13 – 2023-11-20 (×2): qty 90, 90d supply, fill #0
  Filled 2024-02-20: qty 90, 90d supply, fill #1

## 2023-11-13 MED ORDER — ATORVASTATIN CALCIUM 40 MG PO TABS
40.0000 mg | ORAL_TABLET | Freq: Every day | ORAL | 2 refills | Status: AC
  Filled 2023-11-13 – 2023-11-20 (×2): qty 90, 90d supply, fill #0
  Filled 2024-02-20: qty 90, 90d supply, fill #1

## 2023-11-13 MED ORDER — ISOSORBIDE MONONITRATE ER 30 MG PO TB24
30.0000 mg | ORAL_TABLET | Freq: Every day | ORAL | 3 refills | Status: AC
  Filled 2023-11-13 – 2023-11-20 (×2): qty 90, 90d supply, fill #0
  Filled 2024-02-20: qty 90, 90d supply, fill #1

## 2023-11-13 NOTE — Progress Notes (Signed)
 Cardiology Office Note   Date:  11/13/2023  ID:  Ronald Stafford, DOB 11-Jul-1930, MRN [id number] PCP: Geofm Glade PARAS, MD   [facility] Providers Cardiologist:  Shelda Bruckner, MD Cardiology APP:  Vannie Reche RAMAN, NP     History of Present Illness Ronald Stafford is a 89 y.o. male  with a hx of hypertension, coronary artery calcification on CT, aortic atherosclerosis, carotid artery disease, PAD, HLD, TIA/CVA, history of DVT.   Presented to the hospital 04/07/2022 following a fall the day prior with severe left-sided chest pain.  He had seen primary care and troponin elevated at 60 and advised to present to ED.  CT chest revealed severe aortic atherosclerosis and two-vessel coronary calcification.  Inadvertent finding of RLL 8 mm subpleural nodule recommended for repeat noncontrast chest CT in 6 to 12 months.  And again 18 to 24 months.  Echocardiogram LVEF 60 to 65%, grade 1 diastolic dysfunction, mild calcification of aortic valve.   He saw Ronald Hoover, NP 04/17/2022.  He had 2 episodes of chest pain since discharge, subsequent Lexiscan  Myoview  04/24/2022 was low risk with no evidence of ischemia.   ED visit 05/03/2022 after a fall while getting out of the hot tub landing on his right hip and thigh.  Noted to have intramuscular hematoma but no hip fracture or skeletal injury.  Recommended for rest, ice, heat, elevation, Tylenol /NSAID and follow-up with PCP.  Last seen 05/2022 doing well from cardiac perspective.   PCP visit 09/2023 with precordial pain atypical for angina. MRA head and neck with numerous chronic cerebral microhemorrhages (?cerebral amyloid angiopathy), meningioma without mass effect or brain edema, small acute to early subacute right MCA infarcts, 35% RICA stenosis, 65% stenosis prox L subclavian artery.  Seen 11/02/23 in ED with chest pain. EKG NSR with PVC's and no acute ST/T wave changes. Chest pain relieved with 325mg  asa and nitroglycerin  by EMS. HS troponin  7 ? 8.  D-dimer negative with age adjustment. CXR no acute findings.   Presents today for follow up with his son. Reports exertional chest discomfort once per week lasting about an hour. Most often  after more strenuous activity. Relived by rest. The pain is midsternal and does not radiate.It is associated with exertional dyspnea. No shortness of breath at rest. No orthopnea, PND, palpitations, edema. Reports some dizziness described as spinning most notable with head positions. No prior diagnosis of vertigo. No near syncope, syncope. BP at home 140-150s.   ROS: Please see the history of present illness.    All other systems reviewed and are negative.   Studies Reviewed      Cardiac Studies & Procedures   ______________________________________________________________________________________________   STRESS TESTS  MYOCARDIAL PERFUSION IMAGING 04/24/2022  Interpretation Summary   The study is normal. The study is low risk.   No ST deviation was noted.   Left ventricular function is normal. Nuclear stress EF: 57 %. The left ventricular ejection fraction is normal (55-65%). End diastolic cavity size is normal.   Prior study not available for comparison.   ECHOCARDIOGRAM  ECHOCARDIOGRAM COMPLETE 04/07/2022  Narrative ECHOCARDIOGRAM REPORT    Patient Name:   Ronald Stafford Date of Exam: 04/07/2022 Medical Rec #:  [id number]    Height:       74.0 in Accession #:    [id number]   Weight:       180.0 lb Date of Birth:  [birth year]     BSA:  2.078 m Patient Age:    89 years     BP:           124/62 mmHg Patient Gender: M            HR:           74 bpm. Exam Location:  Inpatient  Procedure: 2D Echo, Cardiac Doppler and Color Doppler  Indications:    NSTEMI I21.4  History:        Patient has no prior history of Echocardiogram examinations. Previous Myocardial Infarction, TIA; Risk Factors:Hypertension and Dyslipidemia.  Sonographer:    Thea Norlander Referring Phys: [phone] DEBBY  CROSLEY   Sonographer Comments: Suboptimal parasternal window and suboptimal apical window. Image acquisition challenging due to respiratory motion. IMPRESSIONS   1. Overall poor quality images and no definity used. 2. Left ventricular ejection fraction, by estimation, is 60 to 65%. The left ventricle has normal function. The left ventricle has no regional wall motion abnormalities. Left ventricular diastolic parameters are consistent with Grade I diastolic dysfunction (impaired relaxation). 3. Right ventricular systolic function is normal. The right ventricular size is normal. 4. Likley shadowing artifact in RA in subcostal views May be from lipomatous hypertrophy Can consider chest CTA to further r/o mass in RA. 5. The mitral valve is normal in structure. No evidence of mitral valve regurgitation. No evidence of mitral stenosis. 6. The aortic valve was not well visualized. There is mild calcification of the aortic valve. There is mild thickening of the aortic valve. Aortic valve regurgitation is not visualized. Aortic valve sclerosis is present, with no evidence of aortic valve stenosis. 7. The inferior vena cava is normal in size with greater than 50% respiratory variability, suggesting right atrial pressure of 3 mmHg.  FINDINGS Left Ventricle: Left ventricular ejection fraction, by estimation, is 60 to 65%. The left ventricle has normal function. The left ventricle has no regional wall motion abnormalities. The left ventricular internal cavity size was normal in size. There is no left ventricular hypertrophy. Left ventricular diastolic parameters are consistent with Grade I diastolic dysfunction (impaired relaxation).  Right Ventricle: The right ventricular size is normal. No increase in right ventricular wall thickness. Right ventricular systolic function is normal.  Left Atrium: Left atrial size was normal in size.  Right Atrium: Likley shadowing artifact in RA in subcostal views May  be from lipomatous hypertrophy Can consider chest CTA to further r/o mass in RA. Right atrial size was normal in size.  Pericardium: There is no evidence of pericardial effusion.  Mitral Valve: The mitral valve is normal in structure. No evidence of mitral valve regurgitation. No evidence of mitral valve stenosis.  Tricuspid Valve: The tricuspid valve is normal in structure. Tricuspid valve regurgitation is not demonstrated. No evidence of tricuspid stenosis.  Aortic Valve: The aortic valve was not well visualized. There is mild calcification of the aortic valve. There is mild thickening of the aortic valve. Aortic valve regurgitation is not visualized. Aortic valve sclerosis is present, with no evidence of aortic valve stenosis. Aortic valve mean gradient measures 3.0 mmHg. Aortic valve peak gradient measures 4.8 mmHg. Aortic valve area, by VTI measures 2.29 cm.  Pulmonic Valve: The pulmonic valve was normal in structure. Pulmonic valve regurgitation is not visualized. No evidence of pulmonic stenosis.  Aorta: The aortic root is normal in size and structure.  Venous: The inferior vena cava is normal in size with greater than 50% respiratory variability, suggesting right atrial pressure of 3 mmHg.  IAS/Shunts: No  atrial level shunt detected by color flow Doppler.  Additional Comments: Overall poor quality images and no definity used.   LEFT VENTRICLE PLAX 2D LVIDd:         4.30 cm   Diastology LVIDs:         2.80 cm   LV e' medial:   5.98 cm/s LV PW:         0.90 cm   LV E/e' medial: 8.3 LV IVS:        0.80 cm LVOT diam:     2.10 cm LV SV:         52 LV SV Index:   25 LVOT Area:     3.46 cm   IVC IVC diam: 1.60 cm  LEFT ATRIUM         Index LA diam:    3.00 cm 1.44 cm/m AORTIC VALVE AV Area (Vmax):    2.22 cm AV Area (Vmean):   2.19 cm AV Area (VTI):     2.29 cm AV Vmax:           109.00 cm/s AV Vmean:          75.500 cm/s AV VTI:            0.228 m AV Peak Grad:       4.8 mmHg AV Mean Grad:      3.0 mmHg LVOT Vmax:         69.85 cm/s LVOT Vmean:        47.800 cm/s LVOT VTI:          0.150 m LVOT/AV VTI ratio: 0.66  AORTA Ao Root diam: 3.40 cm  MITRAL VALVE MV Area (PHT): 3.91 cm    SHUNTS MV Decel Time: 194 msec    Systemic VTI:  0.15 m MV E velocity: 49.70 cm/s  Systemic Diam: 2.10 cm MV A velocity: 72.00 cm/s MV E/A ratio:  0.69  Maude Emmer MD Electronically signed by Maude Emmer MD Signature Date/Time: 04/07/2022/2:08:42 PM    Final          ______________________________________________________________________________________________      Risk Assessment/Calculations           Physical Exam VS:  BP 136/68   Pulse 93   Ht 6' 2.5 (1.892 m)   Wt 169 lb 6.4 oz (76.8 kg)   SpO2 96%   BMI 21.46 kg/m        Wt Readings from Last 3 Encounters:  11/13/23 169 lb 6.4 oz (76.8 kg)  09/24/23 172 lb 9.6 oz (78.3 kg)  09/03/23 175 lb (79.4 kg)    GEN: Well nourished, well developed in no acute distress NECK: No JVD; No carotid bruits CARDIAC: RRR, no murmurs, rubs, gallops RESPIRATORY:  Clear to auscultation without rales, wheezing or rhonchi  ABDOMEN: Soft, non-tender, non-distended EXTREMITIES:  No edema; No deformity   ASSESSMENT AND PLAN  Coronary calcification on CT/hyperlipidemia, LDL goal <70/aortic atherosclerosis-prior CT with two-vessel coronary calcification.  Myoview  04/2018 for low risk study with no ischemia.  Continue GDMT Plavix  75mg  daily, atorvastatin  40mg  daily. Heart healthy diet and regular cardiovascular exercise encouraged.   12/25/22 LDL 45, has follow up with PCP next month and anticipates labs at that time Episodes of chest pain and dyspnea with more than usual activity once per week concerning for angina. Recent ED visit unremarkable. Rx imdur  30mg  daily for antianginal benefit. If with persistent chest discomfort despite anti-anginal may need to consider ischemic eval and/or increased dose. Given  age, hopeful to treat medically.   Hypertension- BP elevated at clinic and at home.  Continue Coreg  6.25mg  BID, Valsartan  80mg  QD. Rx Imdur  30mg  daily, as above. Instructed to monitor BP at home at least 2 hours after medications and sitting for 5-10 minutes. He will contact us  if consistently >130/80.   PAD-continue Plavix  per [facility]. Continue atorvastatin  40mg  daily, refill provided.        Dispo: follow up in 3 months  Signed, Reche GORMAN Finder, NP

## 2023-11-13 NOTE — Patient Instructions (Addendum)
 Medication Instructions:   START TAKING ISOSORBIDE  MONONITRATE (IMDUR ) 30 MG BY MOUTH DAILY  *If you need a refill on your cardiac medications before your next appointment, please call your pharmacy*    Follow-Up:  3 MONTHS WITH DR. CHRISTOPHER, CAITLIN WALKER, NP OR ROSALINE BANE, NP   Other Instructions     [facility] [address] [place],  [place]  [address] Main: [phone]

## 2023-11-14 ENCOUNTER — Other Ambulatory Visit: Payer: Self-pay

## 2023-11-14 ENCOUNTER — Encounter: Payer: Self-pay | Admitting: Pharmacist

## 2023-11-19 ENCOUNTER — Other Ambulatory Visit: Payer: Self-pay

## 2023-11-20 ENCOUNTER — Other Ambulatory Visit: Payer: Self-pay

## 2023-11-20 ENCOUNTER — Other Ambulatory Visit (HOSPITAL_COMMUNITY): Payer: Self-pay

## 2023-12-11 ENCOUNTER — Ambulatory Visit: Payer: Self-pay

## 2023-12-11 ENCOUNTER — Encounter: Payer: Self-pay | Admitting: Internal Medicine

## 2023-12-11 NOTE — Telephone Encounter (Signed)
 FYI Only or Action Required?: FYI only for provider.  Patient was last seen in primary care on 09/24/2023 by Joshua Debby CROME, MD.  Called Nurse Triage reporting Dizziness.  Symptoms began chronic.  Interventions attempted: Rest, hydration, or home remedies.  Symptoms are: unchanged.  Triage Disposition: See Physician Within 24 Hours  Patient/caregiver understands and will follow disposition?: Yes   Copied from CRM #[id number]. Topic: Clinical - Red Word Triage >> Dec 11, 2023 11:44 AM Timindy P wrote: [facility] that prompted transfer to Nurse Triage: Dizziness, fall Reason for Disposition  [1] NO dizziness now AND [2] one or more stroke risk factors (i.e., hypertension, diabetes, prior stroke/TIA/heart attack)  Answer Assessment - Initial Assessment Questions Additional info: Request pcp appointment. Scheduled 12/12/23    1. DESCRIPTION: Describe your dizziness.     Room spinning  2. VERTIGO: Do you feel like either you or the room is spinning or tilting?      Room spinning  3. LIGHTHEADED: Do you feel lightheaded? (e.g., somewhat faint, woozy, weak upon standing)     Light headed  4. SEVERITY: How bad is it?  Can you walk?     Has fallen backward a few times. No injuries. Usually will sit down when feeling off balance which resolves vertigo for some time.  5. ONSET:  When did the dizziness begin?     Ongoing, worse/more frequent in the past two weeks 6. AGGRAVATING FACTORS: Does anything make it worse? (e.g., standing, change in head position)     Change in positions 7. CAUSE: What do you think is causing the dizziness?     Unsure-chronic 8. RECURRENT SYMPTOM: Have you had dizziness before? If Yes, ask: When was the last time? What happened that time?     Chronic-intermittent 9. OTHER SYMPTOMS: Do you have any other symptoms? (e.g., earache, headache, numbness, tinnitus, vomiting, weakness)     Mild posterior neck pain. Denies all other symptoms and  denies injury  Protocols used: Dizziness - Vertigo-A-AH

## 2023-12-11 NOTE — Progress Notes (Deleted)
    Subjective:    Patient ID: Ronald Stafford, male    DOB: 1930-07-29, 89 y.o.   MRN: [id number]      HPI Ronald Stafford is here for No chief complaint on file.        Medications and allergies reviewed with patient and updated if appropriate.  Current Outpatient Medications on File Prior to Visit  Medication Sig Dispense Refill   acetaminophen  (TYLENOL ) 500 MG tablet Take 1,000 mg by mouth every 6 (six) hours as needed for mild pain.     atorvastatin  (LIPITOR) 40 MG tablet Take 1 tablet (40 mg total) by mouth daily. 90 tablet 2   clopidogrel  (PLAVIX ) 75 MG tablet Take 1 tablet (75 mg total) by mouth every morning. 90 tablet 2   ipratropium-albuterol  (DUONEB) 0.5-2.5 (3) MG/3ML SOLN Take 3 mLs by nebulization every 6 (six) hours as needed (shortness of breath).     isosorbide  mononitrate (IMDUR ) 30 MG 24 hr tablet Take 1 tablet (30 mg total) by mouth daily. 90 tablet 3   Omega-3 Fatty Acids (FISH OIL) 500 MG CAPS Take 500 mg by mouth daily.     tamsulosin  (FLOMAX ) 0.4 MG CAPS capsule Take 1 capsule (0.4 mg total) by mouth daily. 30 capsule 0   valsartan  (DIOVAN ) 80 MG tablet Take 1 tablet (80 mg total) by mouth daily. 90 tablet 2   No current facility-administered medications on file prior to visit.    Review of Systems     Objective:  There were no vitals filed for this visit. BP Readings from Last 3 Encounters:  11/13/23 136/68  11/02/23 (!) 182/158  09/24/23 (!) 158/90   Wt Readings from Last 3 Encounters:  11/13/23 169 lb 6.4 oz (76.8 kg)  09/24/23 172 lb 9.6 oz (78.3 kg)  09/03/23 175 lb (79.4 kg)   There is no height or weight on file to calculate BMI.    Physical Exam         Assessment & Plan:    See Problem List for Assessment and Plan of chronic medical problems.

## 2023-12-12 ENCOUNTER — Ambulatory Visit: Admitting: Internal Medicine

## 2023-12-12 DIAGNOSIS — R42 Dizziness and giddiness: Secondary | ICD-10-CM

## 2023-12-12 DIAGNOSIS — R296 Repeated falls: Secondary | ICD-10-CM

## 2023-12-28 ENCOUNTER — Ambulatory Visit: Admitting: Internal Medicine

## 2023-12-31 ENCOUNTER — Encounter: Payer: Self-pay | Admitting: Internal Medicine

## 2023-12-31 NOTE — Progress Notes (Unsigned)
 Subjective:    Patient ID: Ronald Stafford, male    DOB: [birth year], 89 y.o.   MRN: [id number]     HPI Ronald Stafford is here for follow up of his chronic medical problems.  Clemens - injured right knee.  He has fallen multiple times and landed on his right knee multiple times.  He falls because of his right foot drop - has a brace but his foot turns inward and it catches on his other foot causing him to fall.  The knee is tender and swollen.   Current AFO brace cuts into his foot.    He is able to bend the knee and can walk w/o too much pain.    Dizzziness when turns his head a lot.  The dizziness has been fairly chronic, but intermittent.  Medications and allergies reviewed with patient and updated if appropriate.  Current Outpatient Medications on File Prior to Visit  Medication Sig Dispense Refill   acetaminophen  (TYLENOL ) 500 MG tablet Take 1,000 mg by mouth every 6 (six) hours as needed for mild pain.     atorvastatin  (LIPITOR) 40 MG tablet Take 1 tablet (40 mg total) by mouth daily. 90 tablet 2   clopidogrel  (PLAVIX ) 75 MG tablet Take 1 tablet (75 mg total) by mouth every morning. 90 tablet 2   ipratropium-albuterol  (DUONEB) 0.5-2.5 (3) MG/3ML SOLN Take 3 mLs by nebulization every 6 (six) hours as needed (shortness of breath).     isosorbide  mononitrate (IMDUR ) 30 MG 24 hr tablet Take 1 tablet (30 mg total) by mouth daily. 90 tablet 3   Omega-3 Fatty Acids (FISH OIL) 500 MG CAPS Take 500 mg by mouth daily.     tamsulosin  (FLOMAX ) 0.4 MG CAPS capsule Take 1 capsule (0.4 mg total) by mouth daily. 30 capsule 0   valsartan  (DIOVAN ) 80 MG tablet Take 1 tablet (80 mg total) by mouth daily. 90 tablet 2   No current facility-administered medications on file prior to visit.     Review of Systems  Constitutional:  Negative for fever.  Respiratory:  Negative for cough, shortness of breath and wheezing.   Cardiovascular:  Negative for chest pain, palpitations and leg swelling.   Musculoskeletal:  Positive for arthralgias and back pain.  Neurological:  Positive for dizziness. Negative for headaches.       Objective:   Vitals:   01/01/24 1320  BP: (!) 150/80  Pulse: 97  Temp: 97.9 F (36.6 C)  SpO2: 97%   BP Readings from Last 3 Encounters:  01/01/24 (!) 150/80  11/13/23 136/68  11/02/23 (!) 182/158   Wt Readings from Last 3 Encounters:  01/01/24 171 lb (77.6 kg)  11/13/23 169 lb 6.4 oz (76.8 kg)  09/24/23 172 lb 9.6 oz (78.3 kg)   Body mass index is 21.66 kg/m.    Physical Exam Constitutional:      General: He is not in acute distress.    Appearance: Normal appearance. He is not ill-appearing.  HENT:     Head: Normocephalic and atraumatic.  Eyes:     Conjunctiva/sclera: Conjunctivae normal.  Cardiovascular:     Rate and Rhythm: Normal rate and regular rhythm.     Heart sounds: Normal heart sounds.  Pulmonary:     Effort: Pulmonary effort is normal. No respiratory distress.     Breath sounds: Normal breath sounds. No wheezing or rales.  Musculoskeletal:        General: Swelling (riht knee) and tenderness (left anterior ribs -  no obvious deformity) present.     Right lower leg: Edema (mild edema) present.     Left lower leg: No edema.  Skin:    General: Skin is warm and dry.     Findings: Bruising (bruising medial aspect of knee) present. No rash.  Neurological:     Mental Status: He is alert. Mental status is at baseline.  Psychiatric:        Mood and Affect: Mood normal.        Lab Results  Component Value Date   WBC 4.3 11/02/2023   HGB 11.8 (L) 11/02/2023   HCT 37.9 (L) 11/02/2023   PLT 132 (L) 11/02/2023   GLUCOSE 103 (H) 11/02/2023   CHOL 103 12/25/2022   TRIG 85.0 12/25/2022   HDL 41.10 12/25/2022   LDLCALC 45 12/25/2022   ALT 20 05/14/2023   AST 21 05/14/2023   NA 147 (H) 11/02/2023   K 3.9 11/02/2023   CL 112 (H) 11/02/2023   CREATININE 0.88 11/02/2023   BUN 13 11/02/2023   CO2 27 11/02/2023   TSH 2.10  10/01/2023   PSA 0.01 (L) 05/14/2023   INR 1.1 04/07/2022   HGBA1C 5.6 12/25/2022     Assessment & Plan:    See Problem List for Assessment and Plan of chronic medical problems.

## 2024-01-01 ENCOUNTER — Ambulatory Visit (INDEPENDENT_AMBULATORY_CARE_PROVIDER_SITE_OTHER): Admitting: Internal Medicine

## 2024-01-01 VITALS — BP 130/86 | HR 97 | Temp 97.9°F | Ht 74.5 in | Wt 171.0 lb

## 2024-01-01 DIAGNOSIS — E7800 Pure hypercholesterolemia, unspecified: Secondary | ICD-10-CM | POA: Diagnosis not present

## 2024-01-01 DIAGNOSIS — D7589 Other specified diseases of blood and blood-forming organs: Secondary | ICD-10-CM

## 2024-01-01 DIAGNOSIS — Z8673 Personal history of transient ischemic attack (TIA), and cerebral infarction without residual deficits: Secondary | ICD-10-CM

## 2024-01-01 DIAGNOSIS — M25561 Pain in right knee: Secondary | ICD-10-CM | POA: Diagnosis not present

## 2024-01-01 DIAGNOSIS — M21371 Foot drop, right foot: Secondary | ICD-10-CM

## 2024-01-01 DIAGNOSIS — R42 Dizziness and giddiness: Secondary | ICD-10-CM

## 2024-01-01 DIAGNOSIS — R7303 Prediabetes: Secondary | ICD-10-CM | POA: Diagnosis not present

## 2024-01-01 DIAGNOSIS — I10 Essential (primary) hypertension: Secondary | ICD-10-CM | POA: Diagnosis not present

## 2024-01-01 DIAGNOSIS — R296 Repeated falls: Secondary | ICD-10-CM | POA: Diagnosis not present

## 2024-01-01 LAB — COMPREHENSIVE METABOLIC PANEL WITH GFR
ALT: 11 U/L (ref 0–53)
AST: 16 U/L (ref 0–37)
Albumin: 4.8 g/dL (ref 3.5–5.2)
Alkaline Phosphatase: 64 U/L (ref 39–117)
BUN: 17 mg/dL (ref 6–23)
CO2: 30 meq/L (ref 19–32)
Calcium: 9.4 mg/dL (ref 8.4–10.5)
Chloride: 107 meq/L (ref 96–112)
Creatinine, Ser: 0.9 mg/dL (ref 0.40–1.50)
GFR: 74.87 mL/min (ref >60.00)
Glucose, Bld: 101 mg/dL — ABNORMAL HIGH (ref 70–99)
Potassium: 4.5 meq/L (ref 3.5–5.1)
Sodium: 144 meq/L (ref 135–145)
Total Bilirubin: 1 mg/dL (ref 0.2–1.2)
Total Protein: 6.9 g/dL (ref 6.0–8.3)

## 2024-01-01 LAB — CBC WITH DIFFERENTIAL/PLATELET
Basophils Absolute: 0.2 K/uL — ABNORMAL HIGH (ref 0.0–0.1)
Basophils Relative: 2.9 % (ref 0.0–3.0)
Eosinophils Absolute: 0.1 K/uL (ref 0.0–0.7)
Eosinophils Relative: 1.6 % (ref 0.0–5.0)
HCT: 35.6 % — ABNORMAL LOW (ref 39.0–52.0)
Hemoglobin: 11.5 g/dL — ABNORMAL LOW (ref 13.0–17.0)
Lymphocytes Relative: 14.7 % (ref 12.0–46.0)
Lymphs Abs: 0.8 K/uL (ref 0.7–4.0)
MCHC: 32.4 g/dL (ref 30.0–36.0)
MCV: 86.4 fl (ref 78.0–100.0)
Monocytes Absolute: 0.9 K/uL (ref 0.1–1.0)
Monocytes Relative: 17 % — ABNORMAL HIGH (ref 3.0–12.0)
Neutro Abs: 3.5 K/uL (ref 1.4–7.7)
Neutrophils Relative %: 63.8 % (ref 43.0–77.0)
Platelets: 151 K/uL (ref 150.0–400.0)
RBC: 4.13 Mil/uL — ABNORMAL LOW (ref 4.22–5.81)
RDW: 16.3 % — ABNORMAL HIGH (ref 11.5–15.5)
WBC: 5.5 K/uL (ref 4.0–10.5)

## 2024-01-01 LAB — HEMOGLOBIN A1C: Hgb A1c MFr Bld: 5.8 % (ref 4.6–6.5)

## 2024-01-01 LAB — LIPID PANEL
Cholesterol: 111 mg/dL (ref 0–200)
HDL: 44.5 mg/dL (ref >39.00)
LDL Cholesterol: 46 mg/dL (ref 0–99)
NonHDL: 66.07
Total CHOL/HDL Ratio: 2
Triglycerides: 102 mg/dL (ref 0.0–149.0)
VLDL: 20.4 mg/dL (ref 0.0–40.0)

## 2024-01-01 LAB — VITAMIN B12: Vitamin B-12: 249 pg/mL (ref 211–911)

## 2024-01-01 NOTE — Assessment & Plan Note (Addendum)
 Chronic He has frequent falls He does have someone coming few times a week to help him do some things around the house which will hopefully help decrease falls He uses a cane, sometimes a walker-stressed to use the walker all the time-several of his falls occurred when he was not using Falls likely multifactorial-spinal stenosis, leg weakness, right foot drop, neuropathy Check B12 level He needs to get his AFO brace repaired then will go to the orthotic place where he had this done.  I think that will help prevent several falls.  If he has any difficulty doing this he will let me know.

## 2024-01-01 NOTE — Assessment & Plan Note (Signed)
 Chronic Mild anemia and mild thrombocytopenia CBC

## 2024-01-01 NOTE — Assessment & Plan Note (Signed)
 Chronic continue plavix 75 mg and atorvastatin 40 mg daily Blood pressure controlled

## 2024-01-01 NOTE — Patient Instructions (Addendum)
   Make an appt with sports medicine --- R knee pain/swelling from injury - effusion or hemarthrosis    Adjustable Drop Foot Brace Foot Up AFO Brace Unisex Fits for Right/Left Foot Orthosis Ankle Brace Support     Follow up with orthotics    Blood work was ordered.       Medications changes include :   None    A referral was ordered physical therapy at [facility] and someone will call you to schedule an appointment.     Return in about 6 months (around 07/01/2024) for follow up.

## 2024-01-01 NOTE — Assessment & Plan Note (Signed)
 Chronic Lab Results  Component Value Date   HGBA1C 5.6 12/25/2022   Low sugar / carb diet Check A1c

## 2024-01-01 NOTE — Assessment & Plan Note (Addendum)
 Chronic Blood pressure is elevated here initially, but improved on repeat Blood pressure adequately controlled No changes in medication today Monitor at home Continue valsartan  80 mg daily CBC, CMP

## 2024-01-01 NOTE — Assessment & Plan Note (Signed)
 Acute Related to several falls-most recent one a couple days ago Knee is swollen and tender Effusion versus hemarthrosis Referral to sports medicine

## 2024-01-01 NOTE — Assessment & Plan Note (Signed)
 Chronic, intermittent States that he had the dizziness with head movements-it will last for a little while and then resolved-worse after moving his head a lot Referral for vestibular PT

## 2024-01-01 NOTE — Assessment & Plan Note (Signed)
 Chronic He continues to be active Encouraged healthy diet Continue atorvastatin  40 mg daily Check lipids, CMP

## 2024-01-01 NOTE — Assessment & Plan Note (Signed)
 Chronic She has an AFO brace, but it cuts into his foot so he does not always wear it.  Even when he does wear it it sometimes causes the foot to turn inward and the foot will catch on his other foot Will get AFO fixed-if he has any difficulty with this he will let me know Can try a different brace to see if that works

## 2024-01-03 ENCOUNTER — Ambulatory Visit: Payer: Self-pay | Admitting: Internal Medicine

## 2024-01-08 ENCOUNTER — Ambulatory Visit (INDEPENDENT_AMBULATORY_CARE_PROVIDER_SITE_OTHER)

## 2024-01-08 ENCOUNTER — Ambulatory Visit: Admitting: Family Medicine

## 2024-01-08 ENCOUNTER — Other Ambulatory Visit: Payer: Self-pay

## 2024-01-08 VITALS — BP 188/86 | HR 92 | Ht 74.5 in

## 2024-01-08 DIAGNOSIS — G8929 Other chronic pain: Secondary | ICD-10-CM

## 2024-01-08 DIAGNOSIS — R296 Repeated falls: Secondary | ICD-10-CM

## 2024-01-08 DIAGNOSIS — M25561 Pain in right knee: Secondary | ICD-10-CM | POA: Diagnosis not present

## 2024-01-08 NOTE — Patient Instructions (Addendum)
Thank you for coming in today.   Please get an Xray today before you leave   I've referred you to Physical Therapy.  Let us know if you don't hear from them in one week.   Check back as needed 

## 2024-01-08 NOTE — Progress Notes (Signed)
   Ronald Ileana Collet, PhD, LAT, ATC acting as a scribe for Artist Lloyd, MD.  Ronald Stafford is a 89 y.o. male who presents to [facility] at [facility] today for R knee pain. Pt was previously seen by Dr. Lloyd on 07/20/23 for L shoulder pain   Today, pt c/o R knee pain x a couple wks. He notes suffering several falls. Pain is located all over the R knee. He also notes his entire R leg hurts, esp lateral hip.   R Knee swelling: yes Mechanical symptoms: no Aggravates: walking, transitioning to stand Treatments tried: ice, Voltaren  gel  Pertinent review of systems: No fevers or chills  Relevant historical information: PAD.  Carotid artery disease.  Frequent falls.  History of a TIA.   Exam:  BP (!) 188/86   Pulse 92   Ht 6' 2.5 (1.892 m)   SpO2 97%   BMI 21.66 kg/m  General: Well Developed, well nourished, and in no acute distress.   MSK: Right knee has minimal bruising.  Mild knee effusion.  Normal motion intact strength.  Mildly tender palpation anterior medial knee.    Lab and Radiology Results  X-ray images right knee obtained today personally and independently interpreted. Mild medial DJD.  No acute fractures are visible. Await formal radiology review    Assessment and Plan: 89 y.o. male with right knee pain due to contusion and exacerbation of DJD.  His pain is pretty well-controlled right now.  His larger issue is falls.  He is interested in physical therapy referral to work on fall prevention.  Referral placed today.  Check back as needed.  Happy to do a knee injection whenever necessary.   PDMP not reviewed this encounter. Orders Placed This Encounter  Procedures   DG Knee AP/LAT W/Sunrise Right    Standing Status:   Future    Number of Occurrences:   1    Expiration Date:   02/08/2024    Reason for Exam (SYMPTOM  OR DIAGNOSIS REQUIRED):   right knee pain    Preferred imaging location?:   [facility]   Ambulatory referral to Physical  Therapy    Referral Priority:   Routine    Referral Type:   Physical Medicine    Referral Reason:   Specialty Services Required    Requested Specialty:   Physical Therapy    Number of Visits Requested:   1   No orders of the defined types were placed in this encounter.    Discussed warning signs or symptoms. Please see discharge instructions. Patient expresses understanding.   The above documentation has been reviewed and is accurate and complete Artist Lloyd, M.D.

## 2024-01-10 ENCOUNTER — Ambulatory Visit: Payer: Self-pay | Admitting: Family Medicine

## 2024-01-10 NOTE — Progress Notes (Signed)
Right knee x-ray shows some mild arthritis

## 2024-01-14 ENCOUNTER — Encounter: Payer: Self-pay | Admitting: Radiology

## 2024-01-17 ENCOUNTER — Ambulatory Visit: Attending: Internal Medicine | Admitting: Physical Therapy

## 2024-01-17 ENCOUNTER — Other Ambulatory Visit: Payer: Self-pay

## 2024-01-17 ENCOUNTER — Encounter: Payer: Self-pay | Admitting: Physical Therapy

## 2024-01-17 DIAGNOSIS — R2681 Unsteadiness on feet: Secondary | ICD-10-CM | POA: Diagnosis present

## 2024-01-17 DIAGNOSIS — R42 Dizziness and giddiness: Secondary | ICD-10-CM | POA: Insufficient documentation

## 2024-01-17 DIAGNOSIS — R2689 Other abnormalities of gait and mobility: Secondary | ICD-10-CM | POA: Insufficient documentation

## 2024-01-17 NOTE — Therapy (Signed)
 OUTPATIENT PHYSICAL THERAPY VESTIBULAR EVALUATION     Patient Name: Ronald Stafford MRN: [id number] DOB:[birth year], 89 y.o., male Today's Date: 01/18/2024  END OF SESSION:  PT End of Session - 01/18/24 0751     Visit Number 1    Number of Visits 13    Date for Recertification  02/29/24    Authorization Type UHC Medicare/Medicaid-auth submitted    Progress Note Due on Visit 10    PT Start Time 1316    PT Stop Time 1400    PT Time Calculation (min) 44 min    Activity Tolerance Patient tolerated treatment well    Behavior During Therapy [facility] for tasks assessed/performed          Past Medical History:  Diagnosis Date   Cancer (HCC) 2004   prostate cancer, Dr. Alline   Colon polyps    Deep venous thrombosis (HCC) 1997   Postop   Depression    GERD (gastroesophageal reflux disease)    Hyperlipidemia    Hypertension    Radial head fracture, closed 09/18/2013   Shoulder fracture, left 02/23/2011   immobilized in sling , Dr Norris(no surgery)   Past Surgical History:  Procedure Laterality Date   COLONOSCOPY  2006   Negative,Dr.Edwards   colonoscopy with polypectomy      PMH of   ESOPHAGOGASTRODUODENOSCOPY N/A 02/23/2017   Procedure: ESOPHAGOGASTRODUODENOSCOPY (EGD);  Surgeon: Elicia Claw, MD;  Location: [facility];  Service: Gastroenterology;  Laterality: N/A;   JOINT REPLACEMENT  1997, 2001   Total Hip replacement X 2   LUMBAR SPINE SURGERY  09/28/2010   Dr Burnetta; for spinal stenosis   MECKEL DIVERTICULUM EXCISION     Age 28   PROSTATECTOMY  2004   w/ radiation, Dr Alline   TOTAL HIP REVISION Left 02/14/2017   Procedure: Left hip polyethylene EXCHANGE;  Surgeon: Melodi Lerner, MD;  Location: [facility];  Service: Orthopedics;  Laterality: Left;   VASECTOMY     Patient Active Problem List   Diagnosis Date Noted   Right foot drop 01/01/2024   Acute pain of right knee 01/01/2024   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/25/2023   Bilateral amaurosis fugax 09/25/2023   Lumbar  radiculopathy, right 08/10/2023   Right lower quadrant pain 07/20/2023   Difficulty urinating 05/14/2023   Right buttock pain 05/30/2022   Frequent falls 05/09/2022   PAD (peripheral artery disease) 04/07/2022   Chest pain 04/06/2022   Carpal tunnel syndrome, right upper limb 05/24/2021   Cubital tunnel syndrome on right 05/24/2021   Renal cyst, left 06/15/2020   Gross hematuria 06/15/2020   Cervical radiculopathy 06/01/2020   Memory difficulty 06/01/2020   Vertigo 01/03/2019   Urinary incontinence 10/31/2018   Right hand paresthesia 08/10/2017   Skin abnormalities 06/13/2017   History of total hip arthroplasty 03/28/2017   Failed total hip arthroplasty 02/14/2017   Osteoarthritis of left hip 12/10/2016   Left shoulder pain 10/04/2016   History of DVT (deep vein thrombosis) 07/30/2016   History of TIA (transient ischemic attack) 07/30/2016   Bicytopenia 07/30/2016   Carotid arterial disease 07/20/2016   Right sided weakness 06/27/2016   Frequent headaches 06/22/2015   Prediabetes 02/11/2015   Peroneal tendonitis of right lower extremity 01/12/2014   Mass of right ankle 12/31/2013   Lung nodule, solitary 08/08/2012   SPINAL STENOSIS, LUMBAR 04/06/2010   FOOT DROP, RIGHT 05/21/2008   Hypercholesterolemia 12/25/2006   Essential hypertension 12/25/2006   PROSTATE CANCER, HX OF 12/25/2006   POLYP, COLON 05/02/2006  PCP: Geofm Glade PARAS, MD  REFERRING PROVIDER: Geofm Glade PARAS, MD *Also has order from Dr. Artist Lloyd  REFERRING DIAG: R42 (ICD-10-CM) - Vertigo  M25.561,G89.29 (ICD-10-CM) - Chronic pain of right knee (Dr. Lloyd) THERAPY DIAG:  Dizziness and giddiness  Unsteadiness on feet  Other abnormalities of gait and mobility  ONSET DATE: 01/01/2024  Rationale for Evaluation and Treatment: Rehabilitation  SUBJECTIVE:   SUBJECTIVE STATEMENT: Here to work on dizziness and swimmy headedness.  When I move my head, I get dizzy.  It's been worse in the past several  months.  Has had a bunch of falls in the past 6 months.  3 falls in the past week or two-probably because the brace is not working right-makes me catch my foot, so I'm not wearing it today. Pt accompanied by: caregiver  PERTINENT HISTORY: R foot drop wears AFO, chronic dizziness; hx of falls, PAD, hx of TIA  PAIN:  Are you having pain? Yes: NPRS scale: 3/10 Pain location: R knee Pain description: sore, swollen Aggravating factors: moving, walking Relieving factors: ice packs  PRECAUTIONS: Fall  RED FLAGS: None   WEIGHT BEARING RESTRICTIONS: No  FALLS: Has patient fallen in last 6 months? Yes. Number of falls a bunch  LIVING ENVIRONMENT: Lives with: lives alone and has caregiver come in twice a week Lives in: House/apartment Stairs: ramp to enter home Has following equipment at home: Vannie - 2 wheeled, Environmental Consultant - 4 wheeled, and 3-wheeled RW  PLOF: Independent with household mobility with device and Independent with community mobility with device  PATIENT GOALS: To help with the dizziness  OBJECTIVE:  Note: Objective measures were completed at Evaluation unless otherwise noted.  DIAGNOSTIC FINDINGS: 01/08/2024:  X-ray images right knee obtained today personally and independently interpreted. Mild medial DJD.  No acute fractures are visible. MRI 10/2023:  IMPRESSION: 1. Small acute to early subacute right MCA infarcts. 2. Moderate chronic small vessel ischemic disease. 3. Numerous chronic cerebral microhemorrhages and mild superficial siderosis, possibly reflecting cerebral amyloid angiopathy. 4. 13 mm extra-axial mass in the posterior fossa most consistent with a meningioma. No significant mass effect or brain edema. 5. No major intracranial arterial occlusion or significant proximal intracranial stenosis. 6. 35% stenosis of the proximal right ICA. 7. 65% stenosis of the proximal left subclavian artery. 8. Limited assessment of the proximal V1 segment of the  left vertebral artery. A severe stenosis or short segment occlusion is not excluded.  COGNITION: Overall cognitive status: some word finding difficulty, pt reports sometimes getting confused with head motions   SENSATION: Light touch: Impaired    POSTURE:  rounded shoulders and forward head  Cervical ROM:    Active A/PROM (deg) eval  Flexion 20 with pain  Extension 18 with pain  Right lateral flexion   Left lateral flexion   Right rotation 20 pain  Left rotation 25 pain  (Blank rows = not tested)  MMT:  R hip flexion, knee flexion and knee extension 3+/5 L 4/5  R ankle dorsiflexion 3-/5 RLE, 3/5 LLE  BED MOBILITY:  NT  TRANSFERS: Assistive device utilized: use of BUEs  Sit to stand: SBA Stand to sit: SBA Unsteady upon standing GAIT: Gait pattern: has AFO, but not wearing today, step through pattern, decreased step length- Right, decreased stance time- Right, decreased ankle dorsiflexion- Right, Right steppage, wide BOS, and poor foot clearance- Right Distance walked: 50 ft Assistive device utilized: 3-wheeled RW Level of assistance: SBA Comments: not wearing AFO today  FUNCTIONAL TESTS:  10  meter walk test: 20.78 sec = 1.58 ft/sec  PATIENT SURVEYS:  DHI: THE DIZZINESS HANDICAP INVENTORY (DHI)  P1. Does looking up increase your problem? 2 = Sometimes  E2. Because of your problem, do you feel frustrated? 2 = Sometimes  F3. Because of your problem, do you restrict your travel for business or recreation?  4 = Yes  P4. Does walking down the aisle of a supermarket increase your problems?  4 = Yes  F5. Because of your problem, do you have difficulty getting into or out of bed?  0 = No  F6. Does your problem significantly restrict your participation in social activities, such as going out to dinner, going to the movies, dancing, or going to parties? 4 = Yes  F7. Because of your problem, do you have difficulty reading?  4 = Yes  P8. Does performing more ambitious  activities such as sports, dancing, household chores (sweeping or putting dishes away) increase your problems?  0 = No  E9. Because of your problem, are you afraid to leave your home without having without having someone accompany you?  0 = No  E10. Because of your problem have you been embarrassed in front of others?  2 = Sometimes  P11. Do quick movements of your head increase your problem?  4 = Yes  F12. Because of your problem, do you avoid heights?  4 = Yes  P13. Does turning over in bed increase your problem?  0 = No  F14. Because of your problem, is it difficult for you to do strenuous homework or yard work? 4 = Yes  E15. Because of your problem, are you afraid people may think you are intoxicated? 0 = No  F16. Because of your problem, is it difficult for you to go for a walk by yourself?  0 = No  P17. Does walking down a sidewalk increase your problem?  0 = No  E18.Because of your problem, is it difficult for you to concentrate 0 = No  F19. Because of your problem, is it difficult for you to walk around your house in the dark? 4 = Yes  E20. Because of your problem, are you afraid to stay home alone?  0 = No  E21. Because of your problem, do you feel handicapped? 0 = No  E22. Has the problem placed stress on your relationships with members of your family or friends? 0 = No  E23. Because of your problem, are you depressed?  0 = No  F24. Does your problem interfere with your job or household responsibilities?  4 = Yes  P25. Does bending over increase your problem?  4 = Yes  TOTAL 46    DHI Scoring Instructions  The patient is asked to answer each question as it pertains to dizziness or unsteadiness problems, specifically  considering their condition during the last month. Questions are designed to incorporate functional (F), physical  (P), and emotional (E) impacts on disability.   Scores greater than 10 points should be referred to balance specialists for further evaluation.   16-34  Points (mild handicap)  36-52 Points (moderate handicap)  54+ Points (severe handicap)  Minimally Detectable Change: 17 points ([address] [facility], 1990)  [place], G. SHAUNNA. and [place], C. W. (1990). The development of the Dizziness Handicap Inventory. Archives of Otolaryngology - Head and Neck Surgery 116(4): [id number].   VESTIBULAR ASSESSMENT:  GENERAL OBSERVATION: No acute distress   SYMPTOM BEHAVIOR:  Subjective history: Has had multiple falls in the  past 6 months, has hit his head per report.  Had cataract surgery about 1-2 years ago.  Has glassed for reading (not used in testing)  Non-Vestibular symptoms: neck pain and tinnitus (pt reports these are not new)  Type of dizziness: Imbalance (Disequilibrium), Spinning/Vertigo, and Unsteady with head/body turns  Frequency: a couple of times of day  Duration: 5 minutes  Aggravating factors: Induced by motion: looking up at the ceiling and turning head quickly  Relieving factors: head stationary and slow movements  Progression of symptoms: unchanged  OCULOMOTOR EXAM:  Ocular Alignment: normal  Ocular ROM: No Limitations  Spontaneous Nystagmus: absent  Gaze-Induced Nystagmus: absent  Smooth Pursuits: intact and brings on dizziness more horizontal than vertical  Saccades: some overshooting laterally to left, slowed to come back to midline; no symptoms  Convergence/Divergence: 2 cm   Cover-cross-cover test: NT   VESTIBULAR - OCULAR REFLEX:   Slow VOR: Positive Bilaterally and Comment: hard to coordinate, increases dizziness; negative vertically  VOR Cancellation: Comment: brings on dizziness and confusion  Head-Impulse Test: HIT Right: difficulty  HIT Left: difficulty  Unable to assess HIT due to pt's guarding in neck  Dynamic Visual Acuity: NT   POSITIONAL TESTING: Other: NT due to time constraints in eval and pt not reporting positional changes bringing on spinning sensations  MOTION SENSITIVITY:  NOT TESTED AT EVAL  Motion  Sensitivity Quotient Intensity: 0 = none, 1 = Lightheaded, 2 = Mild, 3 = Moderate, 4 = Severe, 5 = Vomiting  Intensity  1. Sitting to supine   2. Supine to L side   3. Supine to R side   4. Supine to sitting   5. L Hallpike-Dix   6. Up from L    7. R Hallpike-Dix   8. Up from R    9. Sitting, head tipped to L knee   10. Head up from L knee   11. Sitting, head tipped to R knee   12. Head up from R knee   13. Sitting head turns x5   14.Sitting head nods x5   15. In stance, 180 turn to L    16. In stance, 180 turn to R     OTHOSTATICS: not done  FUNCTIONAL GAIT: MCTSIB Attempted  M-CTSIB  Condition 1: Firm Surface, EO 10 Sec, Severe Sway  Condition 2: Firm Surface, EC 0 Sec, Severe Sway  Condition 3: Foam Surface, EO unable Sec, NT Sway  Condition 4: Foam Surface, EC unable Sec, NT Sway                                                                                                                              TREATMENT DATE: 01/17/2024    PATIENT EDUCATION: Education details: Eval results, POC; use walker at ALL times to lessen risk of falls; advised pt to follow up with orthotist for AFO fitting/modification Person educated: Patient and Caregiver Jocelyn Education method: Explanation Education comprehension: verbalized understanding  HOME EXERCISE PROGRAM:  GOALS: Goals reviewed with patient? Yes  SHORT TERM GOALS: Target date: 02/15/2024  Pt will be supervision with HEP for improved dizziness, balance. Baseline: Goal status: INITIAL   LONG TERM GOALS: Target date: 02/29/2024  Pt will be supervision with HEP for improved dizziness, balance. Baseline:  Goal status: INITIAL  2.  Pt will improve gait velocity to at least 2 ft/sec for improved gait efficiency and safety. Baseline: 1.58 ft/sec Goal status: INITIAL  3.  DHI score to improve to less than or equal to 28 to demo decreased dizziness impacting ADLs. Baseline:  Goal status: INITIAL  4.  Pt will  improve Berg score to at least 45/56 to decrease fall risk. Baseline: TBD Goal status: INITIAL  5.  Pt will verbalize understanding of fall prevention in home environment. Baseline:  Goal status: INITIAL  ASSESSMENT:  CLINICAL IMPRESSION: Patient is a 89 y.o. male who was seen today for physical therapy evaluation and treatment for vertigo, with history of multiple falls.  Of note, he has another order on chart for knee pain as well, but pt wants to focus on dizziness and balance.  Pt has history of foot drop and wears AFO; however, he is not wearing AFO at eval and reports he feels it is not fitting appropriately and causing some of his falls.  He has had multiple falls in the past 6 months.  He presents with dizziness brought on today with head motions, reports of neck pain, abnormal VOR and VOR cancellation, bringing on increased symptoms.  He is unable to stand unsupported to complete MCTSIB testing with severe sway/LOB and needs therapist assist to prevent fall.  Did not complete positional testing today as pt's reports of dizziness do not coincide with positional movements and he does not seem to report true spinning motion; will need to assess at future visit.  He is at increased fall risk per gait velocity of 1.58 ft/sec.  He will likely benefit from skilled PT to address dizziness, balance, gait, and to lower risk of falls for improved safety with functional mobility.  OBJECTIVE IMPAIRMENTS: Abnormal gait, decreased balance, decreased mobility, difficulty walking, decreased strength, and dizziness.   ACTIVITY LIMITATIONS: standing, transfers, and locomotion level  PARTICIPATION LIMITATIONS: meal prep, cleaning, laundry, and community activity  PERSONAL FACTORS: 3+ comorbidities: see above are also affecting patient's functional outcome.   REHAB POTENTIAL: Good  CLINICAL DECISION MAKING: Stable/uncomplicated  EVALUATION COMPLEXITY: Low   PLAN:  PT FREQUENCY: 1-2x/week  PT  DURATION: 6 weeks plus eval  PLANNED INTERVENTIONS: 97750- Physical Performance Testing, 97110-Therapeutic exercises, 97530- Therapeutic activity, 97112- Neuromuscular re-education, 97535- Self Care, 02859- Manual therapy, [phone]- Gait training, [phone]- Canalith repositioning, Patient/Family education, Balance training, and Vestibular training  PLAN FOR NEXT SESSION: Assess Lars, positional testing for BPPV (may need to modify due to neck pain);  initiate HEP-neck flexibility, habituation and VOR exercises, balance; fall prevention education   STARLET GREIG ORN., PT 01/18/2024, 7:54 AM   [facility] at [facility] [address], [address] [place], [place] [address] Phone # [phone] Fax # [phone]

## 2024-01-21 ENCOUNTER — Telehealth: Payer: Self-pay

## 2024-01-21 DIAGNOSIS — M21372 Foot drop, left foot: Secondary | ICD-10-CM

## 2024-01-21 NOTE — Telephone Encounter (Signed)
 Copied from CRM [id number]. Topic: Clinical - Medical Advice >> Jan 21, 2024  1:34 PM Thersia BROCKS wrote: Reason for CRM: Jocelyn called in stated patient NEEDS PRESCRIPTION FROM Dr.Burns FOR A DIFFERENT SIDE OF  AFO  rubbing on skin and cuts him   [phone]

## 2024-01-22 NOTE — Telephone Encounter (Signed)
 Patient needs to follow up with whoever he got AFT brace from to see if they are able to help with this.

## 2024-01-22 NOTE — Telephone Encounter (Signed)
 Left message for patient to return call to clinic.

## 2024-01-23 NOTE — Telephone Encounter (Unsigned)
 Copied from CRM [id number]. Topic: General - Call Back - No Documentation >> Jan 23, 2024  1:33 PM Eva FALCON wrote: Reason for CRM: Josselin is calling back, states she went to where he got the brace which was [facility] that was on [employer] but now moved to [employer]. and states they were advised that nothing can be done. That Dr. Geofm had to send a prescription to them. Please reach out to Josselin for any other questions/concerns. She also provided the telephone number for the medical supply company. [phone].

## 2024-01-24 DIAGNOSIS — M21372 Foot drop, left foot: Secondary | ICD-10-CM | POA: Insufficient documentation

## 2024-01-24 NOTE — Telephone Encounter (Signed)
DME printed

## 2024-01-24 NOTE — Addendum Note (Signed)
 Addended by: GEOFM GLADE PARAS on: 01/24/2024 06:35 PM   Modules accepted: Orders

## 2024-01-25 NOTE — Telephone Encounter (Signed)
 Faxed today

## 2024-01-28 ENCOUNTER — Ambulatory Visit: Admitting: Physical Therapy

## 2024-01-28 ENCOUNTER — Telehealth: Payer: Self-pay | Admitting: Physical Therapy

## 2024-01-28 NOTE — Therapy (Incomplete)
 OUTPATIENT PHYSICAL THERAPY VESTIBULAR EVALUATION     Patient Name: ZAIDEN LUDLUM MRN: [id number] DOB:1930-07-30, 89 y.o., male Today's Date: 01/28/2024  END OF SESSION:    Past Medical History:  Diagnosis Date   Cancer [facility]) 2004   prostate cancer, Dr. Alline   Colon polyps    Deep venous thrombosis (HCC) 1997   Postop   Depression    GERD (gastroesophageal reflux disease)    Hyperlipidemia    Hypertension    Radial head fracture, closed 09/18/2013   Shoulder fracture, left 02/23/2011   immobilized in sling , Dr Norris(no surgery)   Past Surgical History:  Procedure Laterality Date   COLONOSCOPY  2006   Negative,Dr.Edwards   colonoscopy with polypectomy      PMH of   ESOPHAGOGASTRODUODENOSCOPY N/A 02/23/2017   Procedure: ESOPHAGOGASTRODUODENOSCOPY (EGD);  Surgeon: Elicia Claw, MD;  Location: [facility];  Service: Gastroenterology;  Laterality: N/A;   JOINT REPLACEMENT  1997, 2001   Total Hip replacement X 2   LUMBAR SPINE SURGERY  09/28/2010   Dr Burnetta; for spinal stenosis   MECKEL DIVERTICULUM EXCISION     Age 89   PROSTATECTOMY  2004   w/ radiation, Dr Alline   TOTAL HIP REVISION Left 02/14/2017   Procedure: Left hip polyethylene EXCHANGE;  Surgeon: Melodi Lerner, MD;  Location: [facility];  Service: Orthopedics;  Laterality: Left;   VASECTOMY     Patient Active Problem List   Diagnosis Date Noted   Left foot drop 01/24/2024   Right foot drop 01/01/2024   Acute pain of right knee 01/01/2024   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/25/2023   Bilateral amaurosis fugax 09/25/2023   Lumbar radiculopathy, right 08/10/2023   Right lower quadrant pain 07/20/2023   Difficulty urinating 05/14/2023   Right buttock pain 05/30/2022   Frequent falls 05/09/2022   PAD (peripheral artery disease) 04/07/2022   Chest pain 04/06/2022   Carpal tunnel syndrome, right upper limb 05/24/2021   Cubital tunnel syndrome on right 05/24/2021   Renal cyst, left 06/15/2020   Gross hematuria  06/15/2020   Cervical radiculopathy 06/01/2020   Memory difficulty 06/01/2020   Vertigo 01/03/2019   Urinary incontinence 10/31/2018   Right hand paresthesia 08/10/2017   Skin abnormalities 06/13/2017   History of total hip arthroplasty 03/28/2017   Failed total hip arthroplasty 02/14/2017   Osteoarthritis of left hip 12/10/2016   Left shoulder pain 10/04/2016   History of DVT (deep vein thrombosis) 07/30/2016   History of TIA (transient ischemic attack) 07/30/2016   Bicytopenia 07/30/2016   Carotid arterial disease 07/20/2016   Right sided weakness 06/27/2016   Frequent headaches 06/22/2015   Prediabetes 02/11/2015   Peroneal tendonitis of right lower extremity 01/12/2014   Mass of right ankle 12/31/2013   Lung nodule, solitary 08/08/2012   SPINAL STENOSIS, LUMBAR 04/06/2010   FOOT DROP, RIGHT 05/21/2008   Hypercholesterolemia 12/25/2006   Essential hypertension 12/25/2006   PROSTATE CANCER, HX OF 12/25/2006   POLYP, COLON 05/02/2006    PCP: Geofm Glade PARAS, MD  REFERRING PROVIDER: Geofm Glade PARAS, MD *Also has order from Dr. Artist Lloyd  REFERRING DIAG: R42 (ICD-10-CM) - Vertigo  M25.561,G89.29 (ICD-10-CM) - Chronic pain of right knee (Dr. Lloyd) THERAPY DIAG:  No diagnosis found.  ONSET DATE: 01/01/2024  Rationale for Evaluation and Treatment: Rehabilitation  SUBJECTIVE:   SUBJECTIVE STATEMENT: ***Here to work on dizziness and swimmy headedness.  When I move my head, I get dizzy.  It's been worse in the past several months.  Has  had a bunch of falls in the past 6 months.  3 falls in the past week or two-probably because the brace is not working right-makes me catch my foot, so I'm not wearing it today. Pt accompanied by: caregiver  PERTINENT HISTORY: R foot drop wears AFO, chronic dizziness; hx of falls, PAD, hx of TIA  PAIN:  Are you having pain? Yes: NPRS scale: 3/10 Pain location: R knee Pain description: sore, swollen Aggravating factors: moving,  walking Relieving factors: ice packs  PRECAUTIONS: Fall  RED FLAGS: None   WEIGHT BEARING RESTRICTIONS: No  FALLS: Has patient fallen in last 6 months? Yes. Number of falls a bunch  LIVING ENVIRONMENT: Lives with: lives alone and has caregiver come in twice a week Lives in: House/apartment Stairs: ramp to enter home Has following equipment at home: Vannie - 2 wheeled, Environmental Consultant - 4 wheeled, and 3-wheeled RW  PLOF: Independent with household mobility with device and Independent with community mobility with device  PATIENT GOALS: To help with the dizziness  OBJECTIVE:    TODAY'S TREATMENT: 01/28/2024 Activity Comments                     ---------------------------------------- Note: Objective measures were completed at Evaluation unless otherwise noted.  DIAGNOSTIC FINDINGS: 01/08/2024:  X-ray images right knee obtained today personally and independently interpreted. Mild medial DJD.  No acute fractures are visible. MRI 10/2023:  IMPRESSION: 1. Small acute to early subacute right MCA infarcts. 2. Moderate chronic small vessel ischemic disease. 3. Numerous chronic cerebral microhemorrhages and mild superficial siderosis, possibly reflecting cerebral amyloid angiopathy. 4. 13 mm extra-axial mass in the posterior fossa most consistent with a meningioma. No significant mass effect or brain edema. 5. No major intracranial arterial occlusion or significant proximal intracranial stenosis. 6. 35% stenosis of the proximal right ICA. 7. 65% stenosis of the proximal left subclavian artery. 8. Limited assessment of the proximal V1 segment of the left vertebral artery. A severe stenosis or short segment occlusion is not excluded.  COGNITION: Overall cognitive status: some word finding difficulty, pt reports sometimes getting confused with head motions   SENSATION: Light touch: Impaired    POSTURE:  rounded shoulders and forward head  Cervical ROM:    Active A/PROM  (deg) eval  Flexion 20 with pain  Extension 18 with pain  Right lateral flexion   Left lateral flexion   Right rotation 20 pain  Left rotation 25 pain  (Blank rows = not tested)  MMT:  R hip flexion, knee flexion and knee extension 3+/5 L 4/5  R ankle dorsiflexion 3-/5 RLE, 3/5 LLE  BED MOBILITY:  NT  TRANSFERS: Assistive device utilized: use of BUEs  Sit to stand: SBA Stand to sit: SBA Unsteady upon standing GAIT: Gait pattern: has AFO, but not wearing today, step through pattern, decreased step length- Right, decreased stance time- Right, decreased ankle dorsiflexion- Right, Right steppage, wide BOS, and poor foot clearance- Right Distance walked: 50 ft Assistive device utilized: 3-wheeled RW Level of assistance: SBA Comments: not wearing AFO today  FUNCTIONAL TESTS:  10 meter walk test: 20.78 sec = 1.58 ft/sec  PATIENT SURVEYS:  DHI: THE DIZZINESS HANDICAP INVENTORY (DHI)  P1. Does looking up increase your problem? 2 = Sometimes  E2. Because of your problem, do you feel frustrated? 2 = Sometimes  F3. Because of your problem, do you restrict your travel for business or recreation?  4 = Yes  P4. Does walking down the aisle of a supermarket  increase your problems?  4 = Yes  F5. Because of your problem, do you have difficulty getting into or out of bed?  0 = No  F6. Does your problem significantly restrict your participation in social activities, such as going out to dinner, going to the movies, dancing, or going to parties? 4 = Yes  F7. Because of your problem, do you have difficulty reading?  4 = Yes  P8. Does performing more ambitious activities such as sports, dancing, household chores (sweeping or putting dishes away) increase your problems?  0 = No  E9. Because of your problem, are you afraid to leave your home without having without having someone accompany you?  0 = No  E10. Because of your problem have you been embarrassed in front of others?  2 = Sometimes  P11.  Do quick movements of your head increase your problem?  4 = Yes  F12. Because of your problem, do you avoid heights?  4 = Yes  P13. Does turning over in bed increase your problem?  0 = No  F14. Because of your problem, is it difficult for you to do strenuous homework or yard work? 4 = Yes  E15. Because of your problem, are you afraid people may think you are intoxicated? 0 = No  F16. Because of your problem, is it difficult for you to go for a walk by yourself?  0 = No  P17. Does walking down a sidewalk increase your problem?  0 = No  E18.Because of your problem, is it difficult for you to concentrate 0 = No  F19. Because of your problem, is it difficult for you to walk around your house in the dark? 4 = Yes  E20. Because of your problem, are you afraid to stay home alone?  0 = No  E21. Because of your problem, do you feel handicapped? 0 = No  E22. Has the problem placed stress on your relationships with members of your family or friends? 0 = No  E23. Because of your problem, are you depressed?  0 = No  F24. Does your problem interfere with your job or household responsibilities?  4 = Yes  P25. Does bending over increase your problem?  4 = Yes  TOTAL 46    DHI Scoring Instructions  The patient is asked to answer each question as it pertains to dizziness or unsteadiness problems, specifically  considering their condition during the last month. Questions are designed to incorporate functional (F), physical  (P), and emotional (E) impacts on disability.   Scores greater than 10 points should be referred to balance specialists for further evaluation.   16-34 Points (mild handicap)  36-52 Points (moderate handicap)  54+ Points (severe handicap)  Minimally Detectable Change: 17 points ([address] [place], 1990)  [place], G. SHAUNNA. and [place], C. W. (1990). The development of the Dizziness Handicap Inventory. Archives of Otolaryngology - Head and Neck Surgery 116(4): [id number].   VESTIBULAR  ASSESSMENT:  GENERAL OBSERVATION: No acute distress   SYMPTOM BEHAVIOR:  Subjective history: Has had multiple falls in the past 6 months, has hit his head per report.  Had cataract surgery about 1-2 years ago.  Has glassed for reading (not used in testing)  Non-Vestibular symptoms: neck pain and tinnitus (pt reports these are not new)  Type of dizziness: Imbalance (Disequilibrium), Spinning/Vertigo, and Unsteady with head/body turns  Frequency: a couple of times of day  Duration: 5 minutes  Aggravating factors: Induced by motion: looking up  at the ceiling and turning head quickly  Relieving factors: head stationary and slow movements  Progression of symptoms: unchanged  OCULOMOTOR EXAM:  Ocular Alignment: normal  Ocular ROM: No Limitations  Spontaneous Nystagmus: absent  Gaze-Induced Nystagmus: absent  Smooth Pursuits: intact and brings on dizziness more horizontal than vertical  Saccades: some overshooting laterally to left, slowed to come back to midline; no symptoms  Convergence/Divergence: 2 cm   Cover-cross-cover test: NT   VESTIBULAR - OCULAR REFLEX:   Slow VOR: Positive Bilaterally and Comment: hard to coordinate, increases dizziness; negative vertically  VOR Cancellation: Comment: brings on dizziness and confusion  Head-Impulse Test: HIT Right: difficulty  HIT Left: difficulty  Unable to assess HIT due to pt's guarding in neck  Dynamic Visual Acuity: NT   POSITIONAL TESTING: Other: NT due to time constraints in eval and pt not reporting positional changes bringing on spinning sensations  MOTION SENSITIVITY:  NOT TESTED AT EVAL  Motion Sensitivity Quotient Intensity: 0 = none, 1 = Lightheaded, 2 = Mild, 3 = Moderate, 4 = Severe, 5 = Vomiting  Intensity  1. Sitting to supine   2. Supine to L side   3. Supine to R side   4. Supine to sitting   5. L Hallpike-Dix   6. Up from L    7. R Hallpike-Dix   8. Up from R    9. Sitting, head tipped to L knee   10. Head up  from L knee   11. Sitting, head tipped to R knee   12. Head up from R knee   13. Sitting head turns x5   14.Sitting head nods x5   15. In stance, 180 turn to L    16. In stance, 180 turn to R     OTHOSTATICS: not done  FUNCTIONAL GAIT: MCTSIB Attempted  M-CTSIB  Condition 1: Firm Surface, EO 10 Sec, Severe Sway  Condition 2: Firm Surface, EC 0 Sec, Severe Sway  Condition 3: Foam Surface, EO unable Sec, NT Sway  Condition 4: Foam Surface, EC unable Sec, NT Sway                                                                                                                              TREATMENT DATE: 01/17/2024    PATIENT EDUCATION: Education details: Eval results, POC; use walker at ALL times to lessen risk of falls; advised pt to follow up with orthotist for AFO fitting/modification Person educated: Patient and Engineer, Drilling Education method: Explanation Education comprehension: verbalized understanding  HOME EXERCISE PROGRAM:  GOALS: Goals reviewed with patient? Yes  SHORT TERM GOALS: Target date: 02/15/2024  Pt will be supervision with HEP for improved dizziness, balance. Baseline: Goal status: IN PROGRESS   LONG TERM GOALS: Target date: 02/29/2024  Pt will be supervision with HEP for improved dizziness, balance. Baseline:  Goal status: IN PROGRESS  2.  Pt will improve gait velocity to at least 2 ft/sec  for improved gait efficiency and safety. Baseline: 1.58 ft/sec Goal status: IN PROGRESS  3.  DHI score to improve to less than or equal to 28 to demo decreased dizziness impacting ADLs. Baseline:  Goal status: IN PROGRESS  4.  Pt will improve Berg score to at least 45/56 to decrease fall risk. Baseline: TBD Goal status: IN PROGRESS  5.  Pt will verbalize understanding of fall prevention in home environment. Baseline:  Goal status: IN PROGRESS  ASSESSMENT:  CLINICAL IMPRESSION: Pt presents today ***. Skilled PT session focused on ***. Pt needs  ***. Pt will continue to benefit from skilled PT towards goals for improved functional mobility and decreased fall risk.   Patient is a 89 y.o. male who was seen today for physical therapy evaluation and treatment for vertigo, with history of multiple falls.  Of note, he has another order on chart for knee pain as well, but pt wants to focus on dizziness and balance.  Pt has history of foot drop and wears AFO; however, he is not wearing AFO at eval and reports he feels it is not fitting appropriately and causing some of his falls.  He has had multiple falls in the past 6 months.  He presents with dizziness brought on today with head motions, reports of neck pain, abnormal VOR and VOR cancellation, bringing on increased symptoms.  He is unable to stand unsupported to complete MCTSIB testing with severe sway/LOB and needs therapist assist to prevent fall.  Did not complete positional testing today as pt's reports of dizziness do not coincide with positional movements and he does not seem to report true spinning motion; will need to assess at future visit.  He is at increased fall risk per gait velocity of 1.58 ft/sec.  He will likely benefit from skilled PT to address dizziness, balance, gait, and to lower risk of falls for improved safety with functional mobility.  OBJECTIVE IMPAIRMENTS: Abnormal gait, decreased balance, decreased mobility, difficulty walking, decreased strength, and dizziness.   ACTIVITY LIMITATIONS: standing, transfers, and locomotion level  PARTICIPATION LIMITATIONS: meal prep, cleaning, laundry, and community activity  PERSONAL FACTORS: 3+ comorbidities: see above are also affecting patient's functional outcome.   REHAB POTENTIAL: Good  CLINICAL DECISION MAKING: Stable/uncomplicated  EVALUATION COMPLEXITY: Low   PLAN:  PT FREQUENCY: 1-2x/week  PT DURATION: 6 weeks plus eval  PLANNED INTERVENTIONS: 97750- Physical Performance Testing, 97110-Therapeutic exercises, 97530-  Therapeutic activity, 97112- Neuromuscular re-education, 97535- Self Care, 02859- Manual therapy, [phone]- Gait training, [phone]- Canalith repositioning, Patient/Family education, Balance training, and Vestibular training  PLAN FOR NEXT SESSION: ***Assess Berg, positional testing for BPPV (may need to modify due to neck pain);  initiate HEP-neck flexibility, habituation and VOR exercises, balance; fall prevention education   STARLET GREIG ORN., PT 01/28/2024, 7:57 AM   [facility] at [facility] [address], [address] [place], [place] [address] Phone # [phone] Fax # [phone]

## 2024-01-28 NOTE — Telephone Encounter (Signed)
 Attempted to call patient regarding no show for 8:45 PT appointment today.  No answer.  Greig Anon, PT 01/28/24 9:12 AM Phone: [phone] Fax: [phone]

## 2024-02-01 ENCOUNTER — Ambulatory Visit

## 2024-02-01 DIAGNOSIS — R42 Dizziness and giddiness: Secondary | ICD-10-CM

## 2024-02-01 DIAGNOSIS — R2681 Unsteadiness on feet: Secondary | ICD-10-CM

## 2024-02-01 DIAGNOSIS — R2689 Other abnormalities of gait and mobility: Secondary | ICD-10-CM

## 2024-02-01 NOTE — Therapy (Signed)
 OUTPATIENT PHYSICAL THERAPY VESTIBULAR TREATMENT     Patient Name: Ronald Stafford MRN: [id number] DOB:[birth year], 89 y.o., male Today's Date: 02/01/2024  END OF SESSION:  PT End of Session - 02/01/24 1101     Visit Number 2    Number of Visits 13    Date for Recertification  02/29/24    Authorization Type UHC Medicare/Medicaid-auth submitted    Progress Note Due on Visit 10    PT Start Time 1104    PT Stop Time 1145    PT Time Calculation (min) 41 min    Activity Tolerance Patient tolerated treatment well    Behavior During Therapy [facility] for tasks assessed/performed          Past Medical History:  Diagnosis Date   Cancer (HCC) 2004   prostate cancer, Dr. Alline   Colon polyps    Deep venous thrombosis (HCC) 1997   Postop   Depression    GERD (gastroesophageal reflux disease)    Hyperlipidemia    Hypertension    Radial head fracture, closed 09/18/2013   Shoulder fracture, left 02/23/2011   immobilized in sling , Dr Norris(no surgery)   Past Surgical History:  Procedure Laterality Date   COLONOSCOPY  2006   Negative,Dr.Edwards   colonoscopy with polypectomy      PMH of   ESOPHAGOGASTRODUODENOSCOPY N/A 02/23/2017   Procedure: ESOPHAGOGASTRODUODENOSCOPY (EGD);  Surgeon: Elicia Claw, MD;  Location: [facility];  Service: Gastroenterology;  Laterality: N/A;   JOINT REPLACEMENT  1997, 2001   Total Hip replacement X 2   LUMBAR SPINE SURGERY  09/28/2010   Dr Burnetta; for spinal stenosis   MECKEL DIVERTICULUM EXCISION     Age 5   PROSTATECTOMY  2004   w/ radiation, Dr Alline   TOTAL HIP REVISION Left 02/14/2017   Procedure: Left hip polyethylene EXCHANGE;  Surgeon: Melodi Lerner, MD;  Location: [facility];  Service: Orthopedics;  Laterality: Left;   VASECTOMY     Patient Active Problem List   Diagnosis Date Noted   Left foot drop 01/24/2024   Right foot drop 01/01/2024   Acute pain of right knee 01/01/2024   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/25/2023   Bilateral amaurosis  fugax 09/25/2023   Lumbar radiculopathy, right 08/10/2023   Right lower quadrant pain 07/20/2023   Difficulty urinating 05/14/2023   Right buttock pain 05/30/2022   Frequent falls 05/09/2022   PAD (peripheral artery disease) 04/07/2022   Chest pain 04/06/2022   Carpal tunnel syndrome, right upper limb 05/24/2021   Cubital tunnel syndrome on right 05/24/2021   Renal cyst, left 06/15/2020   Gross hematuria 06/15/2020   Cervical radiculopathy 06/01/2020   Memory difficulty 06/01/2020   Vertigo 01/03/2019   Urinary incontinence 10/31/2018   Right hand paresthesia 08/10/2017   Skin abnormalities 06/13/2017   History of total hip arthroplasty 03/28/2017   Failed total hip arthroplasty 02/14/2017   Osteoarthritis of left hip 12/10/2016   Left shoulder pain 10/04/2016   History of DVT (deep vein thrombosis) 07/30/2016   History of TIA (transient ischemic attack) 07/30/2016   Bicytopenia 07/30/2016   Carotid arterial disease 07/20/2016   Right sided weakness 06/27/2016   Frequent headaches 06/22/2015   Prediabetes 02/11/2015   Peroneal tendonitis of right lower extremity 01/12/2014   Mass of right ankle 12/31/2013   Lung nodule, solitary 08/08/2012   SPINAL STENOSIS, LUMBAR 04/06/2010   FOOT DROP, RIGHT 05/21/2008   Hypercholesterolemia 12/25/2006   Essential hypertension 12/25/2006   PROSTATE CANCER, HX OF 12/25/2006  POLYP, COLON 05/02/2006    PCP: Geofm Glade PARAS, MD  REFERRING PROVIDER: Geofm Glade PARAS, MD *Also has order from Dr. Artist Lloyd  REFERRING DIAG: R42 (ICD-10-CM) - Vertigo  M25.561,G89.29 (ICD-10-CM) - Chronic pain of right knee (Dr. Lloyd) THERAPY DIAG:  No diagnosis found.  ONSET DATE: 01/01/2024  Rationale for Evaluation and Treatment: Rehabilitation  SUBJECTIVE:   SUBJECTIVE STATEMENT: Doing as well as I can. Will be getting a new AFO on the right soon. Notes when I move the head around too much get dizzy Pt accompanied by: caregiver  PERTINENT  HISTORY: R foot drop wears AFO, chronic dizziness; hx of falls, PAD, hx of TIA  PAIN:  Are you having pain? Yes: NPRS scale: 3/10 Pain location: R knee Pain description: sore, swollen Aggravating factors: moving, walking Relieving factors: ice packs  PRECAUTIONS: Fall  RED FLAGS: None   WEIGHT BEARING RESTRICTIONS: No  FALLS: Has patient fallen in last 6 months? Yes. Number of falls a bunch  LIVING ENVIRONMENT: Lives with: lives alone and has caregiver come in twice a week Lives in: House/apartment Stairs: ramp to enter home Has following equipment at home: Vannie - 2 wheeled, Environmental Consultant - 4 wheeled, and 3-wheeled RW  PLOF: Independent with household mobility with device and Independent with community mobility with device  PATIENT GOALS: To help with the dizziness  OBJECTIVE:   TODAY'S TREATMENT: 02/01/24 Activity Comments  Berg Balance Test 32/56  VOR x 1 x 30 sec Horizontal/vertical: no symptoms   Corner balance Multisensory balance activities for HEP development              PATIENT EDUCATION: Education details: Eval results, POC; use walker at ALL times to lessen risk of falls; advised pt to follow up with orthotist for AFO fitting/modification Person educated: Patient and Engineer, Drilling Education method: Explanation Education comprehension: verbalized understanding  HOME EXERCISE PROGRAM: Access Code: [email or web address] URL: [email or web address] Date: 02/01/2024 Prepared by: Kelly Ferris Tally  Exercises - Corner Balance Feet Together With Eyes Open  - 1 x daily - 3 reps - 30 sec hold - Corner Balance Feet Together With Eyes Closed  - 1 x daily - 7 x weekly - 3 reps - 30 sec hold - Corner Balance Feet Together: Eyes Open With Head Turns  - 1 x daily - 7 x weekly - 3 reps - 15-30 sec hold - Semi-Tandem Corner Balance With Eyes Open  - 1 x daily - 7 x weekly - 3 reps - 15 sec hold  Note: Objective measures were completed at Evaluation unless otherwise  noted.  DIAGNOSTIC FINDINGS: 01/08/2024:  X-ray images right knee obtained today personally and independently interpreted. Mild medial DJD.  No acute fractures are visible. MRI 10/2023:  IMPRESSION: 1. Small acute to early subacute right MCA infarcts. 2. Moderate chronic small vessel ischemic disease. 3. Numerous chronic cerebral microhemorrhages and mild superficial siderosis, possibly reflecting cerebral amyloid angiopathy. 4. 13 mm extra-axial mass in the posterior fossa most consistent with a meningioma. No significant mass effect or brain edema. 5. No major intracranial arterial occlusion or significant proximal intracranial stenosis. 6. 35% stenosis of the proximal right ICA. 7. 65% stenosis of the proximal left subclavian artery. 8. Limited assessment of the proximal V1 segment of the left vertebral artery. A severe stenosis or short segment occlusion is not excluded.  COGNITION: Overall cognitive status: some word finding difficulty, pt reports sometimes getting confused with head motions   SENSATION: Light touch: Impaired    POSTURE:  rounded  shoulders and forward head  Cervical ROM:    Active A/PROM (deg) eval  Flexion 20 with pain  Extension 18 with pain  Right lateral flexion   Left lateral flexion   Right rotation 20 pain  Left rotation 25 pain  (Blank rows = not tested)  MMT:  R hip flexion, knee flexion and knee extension 3+/5 L 4/5  R ankle dorsiflexion 3-/5 RLE, 3/5 LLE  BED MOBILITY:  NT  TRANSFERS: Assistive device utilized: use of BUEs  Sit to stand: SBA Stand to sit: SBA Unsteady upon standing GAIT: Gait pattern: has AFO, but not wearing today, step through pattern, decreased step length- Right, decreased stance time- Right, decreased ankle dorsiflexion- Right, Right steppage, wide BOS, and poor foot clearance- Right Distance walked: 50 ft Assistive device utilized: 3-wheeled RW Level of assistance: SBA Comments: not wearing AFO  today  FUNCTIONAL TESTS:  10 meter walk test: 20.78 sec = 1.58 ft/sec  PATIENT SURVEYS:  DHI: THE DIZZINESS HANDICAP INVENTORY (DHI)  P1. Does looking up increase your problem? 2 = Sometimes  E2. Because of your problem, do you feel frustrated? 2 = Sometimes  F3. Because of your problem, do you restrict your travel for business or recreation?  4 = Yes  P4. Does walking down the aisle of a supermarket increase your problems?  4 = Yes  F5. Because of your problem, do you have difficulty getting into or out of bed?  0 = No  F6. Does your problem significantly restrict your participation in social activities, such as going out to dinner, going to the movies, dancing, or going to parties? 4 = Yes  F7. Because of your problem, do you have difficulty reading?  4 = Yes  P8. Does performing more ambitious activities such as sports, dancing, household chores (sweeping or putting dishes away) increase your problems?  0 = No  E9. Because of your problem, are you afraid to leave your home without having without having someone accompany you?  0 = No  E10. Because of your problem have you been embarrassed in front of others?  2 = Sometimes  P11. Do quick movements of your head increase your problem?  4 = Yes  F12. Because of your problem, do you avoid heights?  4 = Yes  P13. Does turning over in bed increase your problem?  0 = No  F14. Because of your problem, is it difficult for you to do strenuous homework or yard work? 4 = Yes  E15. Because of your problem, are you afraid people may think you are intoxicated? 0 = No  F16. Because of your problem, is it difficult for you to go for a walk by yourself?  0 = No  P17. Does walking down a sidewalk increase your problem?  0 = No  E18.Because of your problem, is it difficult for you to concentrate 0 = No  F19. Because of your problem, is it difficult for you to walk around your house in the dark? 4 = Yes  E20. Because of your problem, are you afraid to stay  home alone?  0 = No  E21. Because of your problem, do you feel handicapped? 0 = No  E22. Has the problem placed stress on your relationships with members of your family or friends? 0 = No  E23. Because of your problem, are you depressed?  0 = No  F24. Does your problem interfere with your job or household responsibilities?  4 = Yes  P25. Does bending over increase your problem?  4 = Yes  TOTAL 46    DHI Scoring Instructions  The patient is asked to answer each question as it pertains to dizziness or unsteadiness problems, specifically  considering their condition during the last month. Questions are designed to incorporate functional (F), physical  (P), and emotional (E) impacts on disability.   Scores greater than 10 points should be referred to balance specialists for further evaluation.   16-34 Points (mild handicap)  36-52 Points (moderate handicap)  54+ Points (severe handicap)  Minimally Detectable Change: 17 points ([address] [place], 1990)  Goldsmith, G. SHAUNNA. and [place], C. W. (1990). The development of the Dizziness Handicap Inventory. Archives of Otolaryngology - Head and Neck Surgery 116(4): [id number].   VESTIBULAR ASSESSMENT:  GENERAL OBSERVATION: No acute distress   SYMPTOM BEHAVIOR:  Subjective history: Has had multiple falls in the past 6 months, has hit his head per report.  Had cataract surgery about 1-2 years ago.  Has glassed for reading (not used in testing)  Non-Vestibular symptoms: neck pain and tinnitus (pt reports these are not new)  Type of dizziness: Imbalance (Disequilibrium), Spinning/Vertigo, and Unsteady with head/body turns  Frequency: a couple of times of day  Duration: 5 minutes  Aggravating factors: Induced by motion: looking up at the ceiling and turning head quickly  Relieving factors: head stationary and slow movements  Progression of symptoms: unchanged  OCULOMOTOR EXAM:  Ocular Alignment: normal  Ocular ROM: No Limitations  Spontaneous  Nystagmus: absent  Gaze-Induced Nystagmus: absent  Smooth Pursuits: intact and brings on dizziness more horizontal than vertical  Saccades: some overshooting laterally to left, slowed to come back to midline; no symptoms  Convergence/Divergence: 2 cm   Cover-cross-cover test: NT   VESTIBULAR - OCULAR REFLEX:   Slow VOR: Positive Bilaterally and Comment: hard to coordinate, increases dizziness; negative vertically  VOR Cancellation: Comment: brings on dizziness and confusion  Head-Impulse Test: HIT Right: difficulty  HIT Left: difficulty  Unable to assess HIT due to pt's guarding in neck  Dynamic Visual Acuity: NT   POSITIONAL TESTING: Other: NT due to time constraints in eval and pt not reporting positional changes bringing on spinning sensations  MOTION SENSITIVITY:  NOT TESTED AT EVAL  Motion Sensitivity Quotient Intensity: 0 = none, 1 = Lightheaded, 2 = Mild, 3 = Moderate, 4 = Severe, 5 = Vomiting  Intensity  1. Sitting to supine   2. Supine to L side   3. Supine to R side   4. Supine to sitting   5. L Hallpike-Dix   6. Up from L    7. R Hallpike-Dix   8. Up from R    9. Sitting, head tipped to L knee   10. Head up from L knee   11. Sitting, head tipped to R knee   12. Head up from R knee   13. Sitting head turns x5   14.Sitting head nods x5   15. In stance, 180 turn to L    16. In stance, 180 turn to R     OTHOSTATICS: not done  FUNCTIONAL GAIT: MCTSIB Attempted  M-CTSIB  Condition 1: Firm Surface, EO 10 Sec, Severe Sway  Condition 2: Firm Surface, EC 0 Sec, Severe Sway  Condition 3: Foam Surface, EO unable Sec, NT Sway  Condition 4: Foam Surface, EC unable Sec, NT Sway  TREATMENT DATE: 01/17/2024    GOALS: Goals reviewed with patient? Yes  SHORT TERM GOALS: Target date: 02/15/2024  Pt will be supervision with HEP for improved  dizziness, balance. Baseline: Goal status: INITIAL   LONG TERM GOALS: Target date: 02/29/2024  Pt will be supervision with HEP for improved dizziness, balance. Baseline:  Goal status: INITIAL  2.  Pt will improve gait velocity to at least 2 ft/sec for improved gait efficiency and safety. Baseline: 1.58 ft/sec Goal status: INITIAL  3.  DHI score to improve to less than or equal to 28 to demo decreased dizziness impacting ADLs. Baseline:  Goal status: INITIAL  4.  Pt will improve Berg score to at least 45/56 to decrease fall risk. Baseline: 32/56 Goal status: INITIAL  5.  Pt will verbalize understanding of fall prevention in home environment. Baseline:  Goal status: INITIAL  ASSESSMENT:  CLINICAL IMPRESSION: Berg Balance Test performed with score 32/56 indicating high risk for falls with deficits in single limb support, narrow BOS, body position transitions, and maintaining balance unsupported.  Instructed in static multisensory balance activities for HEP development and reviewed and rehearsed with caregiver present as well to perform safely at home.  Several rounds of seated VOR with cues for sequence and sustained frequency and denies any issues of visual or sensory disturbance when performing.  Reports his dizziness seems to be more related to imbalance issues rather than a sensory perception of dizziness/spinning  OBJECTIVE IMPAIRMENTS: Abnormal gait, decreased balance, decreased mobility, difficulty walking, decreased strength, and dizziness.   ACTIVITY LIMITATIONS: standing, transfers, and locomotion level  PARTICIPATION LIMITATIONS: meal prep, cleaning, laundry, and community activity  PERSONAL FACTORS: 3+ comorbidities: see above are also affecting patient's functional outcome.   REHAB POTENTIAL: Good  CLINICAL DECISION MAKING: Stable/uncomplicated  EVALUATION COMPLEXITY: Low   PLAN:  PT FREQUENCY: 1-2x/week  PT DURATION: 6 weeks plus eval  PLANNED  INTERVENTIONS: 97750- Physical Performance Testing, 97110-Therapeutic exercises, 97530- Therapeutic activity, 97112- Neuromuscular re-education, 97535- Self Care, 02859- Manual therapy, [phone]- Gait training, [phone]- Canalith repositioning, Patient/Family education, Balance training, and Vestibular training  PLAN FOR NEXT SESSION: positional testing for BPPV (may need to modify due to neck pain);  initiate HEP-neck flexibility, habituation and VOR exercises, balance; fall prevention education   Jonette MARLA Sandifer, PT 02/01/2024, 11:04 AM   [facility] at [facility] [address] [place], [place] [address] Phone # [phone] Fax # [phone]

## 2024-02-05 ENCOUNTER — Ambulatory Visit: Admitting: Physical Therapy

## 2024-02-05 ENCOUNTER — Encounter: Payer: Self-pay | Admitting: Physical Therapy

## 2024-02-05 DIAGNOSIS — R42 Dizziness and giddiness: Secondary | ICD-10-CM | POA: Diagnosis not present

## 2024-02-05 DIAGNOSIS — R2681 Unsteadiness on feet: Secondary | ICD-10-CM

## 2024-02-05 DIAGNOSIS — R2689 Other abnormalities of gait and mobility: Secondary | ICD-10-CM

## 2024-02-05 NOTE — Therapy (Signed)
 OUTPATIENT PHYSICAL THERAPY VESTIBULAR TREATMENT     Patient Name: Ronald Stafford MRN: [id number] DOB:[birth year], 89 y.o., male Today's Date: 02/05/2024  END OF SESSION:  PT End of Session - 02/05/24 1234     Visit Number 3    Number of Visits 13    Date for Recertification  02/29/24    Authorization Type [facility] Medicare/Medicaid    Authorization Time Period 01/17/24-02/28/24    Authorization - Visit Number 3    Authorization - Number of Visits 13    Progress Note Due on Visit 10    PT Start Time 1235    PT Stop Time 1315    PT Time Calculation (min) 40 min    Equipment Utilized During Treatment Gait belt    Activity Tolerance Patient tolerated treatment well    Behavior During Therapy WFL for tasks assessed/performed           Past Medical History:  Diagnosis Date   Cancer (HCC) 2004   prostate cancer, Dr. Alline   Colon polyps    Deep venous thrombosis (HCC) 1997   Postop   Depression    GERD (gastroesophageal reflux disease)    Hyperlipidemia    Hypertension    Radial head fracture, closed 09/18/2013   Shoulder fracture, left 02/23/2011   immobilized in sling , Dr Norris(no surgery)   Past Surgical History:  Procedure Laterality Date   COLONOSCOPY  2006   Negative,Dr.Edwards   colonoscopy with polypectomy      PMH of   ESOPHAGOGASTRODUODENOSCOPY N/A 02/23/2017   Procedure: ESOPHAGOGASTRODUODENOSCOPY (EGD);  Surgeon: Elicia Claw, MD;  Location: [facility];  Service: Gastroenterology;  Laterality: N/A;   JOINT REPLACEMENT  1997, 2001   Total Hip replacement X 2   LUMBAR SPINE SURGERY  09/28/2010   Dr Burnetta; for spinal stenosis   MECKEL DIVERTICULUM EXCISION     Age 53   PROSTATECTOMY  2004   w/ radiation, Dr Alline   TOTAL HIP REVISION Left 02/14/2017   Procedure: Left hip polyethylene EXCHANGE;  Surgeon: Melodi Lerner, MD;  Location: [facility];  Service: Orthopedics;  Laterality: Left;   VASECTOMY     Patient Active Problem List   Diagnosis Date  Noted   Left foot drop 01/24/2024   Right foot drop 01/01/2024   Acute pain of right knee 01/01/2024   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/25/2023   Bilateral amaurosis fugax 09/25/2023   Lumbar radiculopathy, right 08/10/2023   Right lower quadrant pain 07/20/2023   Difficulty urinating 05/14/2023   Right buttock pain 05/30/2022   Frequent falls 05/09/2022   PAD (peripheral artery disease) 04/07/2022   Chest pain 04/06/2022   Carpal tunnel syndrome, right upper limb 05/24/2021   Cubital tunnel syndrome on right 05/24/2021   Renal cyst, left 06/15/2020   Gross hematuria 06/15/2020   Cervical radiculopathy 06/01/2020   Memory difficulty 06/01/2020   Vertigo 01/03/2019   Urinary incontinence 10/31/2018   Right hand paresthesia 08/10/2017   Skin abnormalities 06/13/2017   History of total hip arthroplasty 03/28/2017   Failed total hip arthroplasty 02/14/2017   Osteoarthritis of left hip 12/10/2016   Left shoulder pain 10/04/2016   History of DVT (deep vein thrombosis) 07/30/2016   History of TIA (transient ischemic attack) 07/30/2016   Bicytopenia 07/30/2016   Carotid arterial disease 07/20/2016   Right sided weakness 06/27/2016   Frequent headaches 06/22/2015   Prediabetes 02/11/2015   Peroneal tendonitis of right lower extremity 01/12/2014   Mass of right ankle 12/31/2013  Lung nodule, solitary 08/08/2012   SPINAL STENOSIS, LUMBAR 04/06/2010   FOOT DROP, RIGHT 05/21/2008   Hypercholesterolemia 12/25/2006   Essential hypertension 12/25/2006   PROSTATE CANCER, HX OF 12/25/2006   POLYP, COLON 05/02/2006    PCP: Geofm Glade PARAS, MD  REFERRING PROVIDER: Geofm Glade PARAS, MD *Also has order from Dr. Artist Lloyd  REFERRING DIAG: R42 (ICD-10-CM) - Vertigo  M25.561,G89.29 (ICD-10-CM) - Chronic pain of right knee (Dr. Lloyd) THERAPY DIAG:  Dizziness and giddiness  Unsteadiness on feet  Other abnormalities of gait and mobility  ONSET DATE: 01/01/2024  Rationale for Evaluation and  Treatment: Rehabilitation  SUBJECTIVE:   SUBJECTIVE STATEMENT: Fell in the bathroom yesterday.  Went to reach the shower bench and it turned over so I fell.  I could use the walker but I don't need to. Pt accompanied by: caregiver  PERTINENT HISTORY: R foot drop wears AFO, chronic dizziness; hx of falls, PAD, hx of TIA  PAIN:  Are you having pain? No pain today.  PRECAUTIONS: Fall  RED FLAGS: None   WEIGHT BEARING RESTRICTIONS: No  FALLS: Has patient fallen in last 6 months? Yes. Number of falls a bunch  LIVING ENVIRONMENT: Lives with: lives alone and has caregiver come in twice a week Lives in: House/apartment Stairs: ramp to enter home Has following equipment at home: Vannie - 2 wheeled, Environmental Consultant - 4 wheeled, and 3-wheeled RW  PLOF: Independent with household mobility with device and Independent with community mobility with device  PATIENT GOALS: To help with the dizziness  OBJECTIVE:    TODAY'S TREATMENT: 02/05/2024 Activity Comments  Reviewed HEP: Corner balance Romberg EO and EC and partial tandem stance Pt reports dizziness and imbalance during these exercises  Wall bumps to work on hip strategy Cues for technique, for eccentric control  Worked with slightly wider BOS and with hip strategy wall bumps to try to improve balance in narrowed BOS  To try to utilize hip strategy as part of balance recovery-cues for eccentric control  Side step and weightshift 2 x 10 Decreased eccentric control and slides foot back to center-PT provides multimodal cues for technique and for use of UE support  Back step and weightshift 10 reps Cues for BUE support and for technique/control and pt c/o R knee pain  Seated hamstring stretch 3 x 15 sec  Good stretch, no c/o pain  Seated head motions 3 x 5 reps  Mild dizziness, saccades noted-dizziness decreases  Seated head nods 5 reps No dizziness   HOME EXERCISE PROGRAM: Access Code: [email or web address] URL:  [email or web address] Date: 02/05/2024 Prepared by: [facility] - [facility]  Exercises - Corner Balance Feet Together With Eyes Open  - 1 x daily - 3 reps - 30 sec hold - Corner Balance Feet Together With Eyes Closed  - 1 x daily - 7 x weekly - 3 reps - 30 sec hold - Corner Balance Feet Together: Eyes Open With Head Turns  - 1 x daily - 7 x weekly - 3 reps - 15-30 sec hold - Semi-Tandem Corner Balance With Eyes Open  - 1 x daily - 7 x weekly - 3 reps - 15 sec hold - Seated Left/Right Head Turns Vestibular Habituation  - 1-2 x daily - 7 x weekly - 3 sets - 5 reps    PATIENT EDUCATION: Education details: Updates to HEP for head turns; use of hip/ankle strategy for balance; mechanism of recent fall, safety in bathroom and need to use rollator at  all times; ice knee and follow up with MD if needed Person educated: Patient and Caregiver Jocelyn Education method: Explanation Education comprehension: verbalized understanding   ----------------------------------------------- Note: Objective measures were completed at Evaluation unless otherwise noted.  DIAGNOSTIC FINDINGS: 01/08/2024:  X-ray images right knee obtained today personally and independently interpreted. Mild medial DJD.  No acute fractures are visible. MRI 10/2023:  IMPRESSION: 1. Small acute to early subacute right MCA infarcts. 2. Moderate chronic small vessel ischemic disease. 3. Numerous chronic cerebral microhemorrhages and mild superficial siderosis, possibly reflecting cerebral amyloid angiopathy. 4. 13 mm extra-axial mass in the posterior fossa most consistent with a meningioma. No significant mass effect or brain edema. 5. No major intracranial arterial occlusion or significant proximal intracranial stenosis. 6. 35% stenosis of the proximal right ICA. 7. 65% stenosis of the proximal left subclavian artery. 8. Limited assessment of the proximal V1 segment of the left vertebral  artery. A severe stenosis or short segment occlusion is not excluded.  COGNITION: Overall cognitive status: some word finding difficulty, pt reports sometimes getting confused with head motions   SENSATION: Light touch: Impaired    POSTURE:  rounded shoulders and forward head  Cervical ROM:    Active A/PROM (deg) eval  Flexion 20 with pain  Extension 18 with pain  Right lateral flexion   Left lateral flexion   Right rotation 20 pain  Left rotation 25 pain  (Blank rows = not tested)  MMT:  R hip flexion, knee flexion and knee extension 3+/5 L 4/5  R ankle dorsiflexion 3-/5 RLE, 3/5 LLE  BED MOBILITY:  NT  TRANSFERS: Assistive device utilized: use of BUEs  Sit to stand: SBA Stand to sit: SBA Unsteady upon standing GAIT: Gait pattern: has AFO, but not wearing today, step through pattern, decreased step length- Right, decreased stance time- Right, decreased ankle dorsiflexion- Right, Right steppage, wide BOS, and poor foot clearance- Right Distance walked: 50 ft Assistive device utilized: 3-wheeled RW Level of assistance: SBA Comments: not wearing AFO today  FUNCTIONAL TESTS:  10 meter walk test: 20.78 sec = 1.58 ft/sec  PATIENT SURVEYS:  DHI: THE DIZZINESS HANDICAP INVENTORY (DHI)  P1. Does looking up increase your problem? 2 = Sometimes  E2. Because of your problem, do you feel frustrated? 2 = Sometimes  F3. Because of your problem, do you restrict your travel for business or recreation?  4 = Yes  P4. Does walking down the aisle of a supermarket increase your problems?  4 = Yes  F5. Because of your problem, do you have difficulty getting into or out of bed?  0 = No  F6. Does your problem significantly restrict your participation in social activities, such as going out to dinner, going to the movies, dancing, or going to parties? 4 = Yes  F7. Because of your problem, do you have difficulty reading?  4 = Yes  P8. Does performing more ambitious activities such as  sports, dancing, household chores (sweeping or putting dishes away) increase your problems?  0 = No  E9. Because of your problem, are you afraid to leave your home without having without having someone accompany you?  0 = No  E10. Because of your problem have you been embarrassed in front of others?  2 = Sometimes  P11. Do quick movements of your head increase your problem?  4 = Yes  F12. Because of your problem, do you avoid heights?  4 = Yes  P13. Does turning over in bed increase your problem?  0 = No  F14. Because of your problem, is it difficult for you to do strenuous homework or yard work? 4 = Yes  E15. Because of your problem, are you afraid people may think you are intoxicated? 0 = No  F16. Because of your problem, is it difficult for you to go for a walk by yourself?  0 = No  P17. Does walking down a sidewalk increase your problem?  0 = No  E18.Because of your problem, is it difficult for you to concentrate 0 = No  F19. Because of your problem, is it difficult for you to walk around your house in the dark? 4 = Yes  E20. Because of your problem, are you afraid to stay home alone?  0 = No  E21. Because of your problem, do you feel handicapped? 0 = No  E22. Has the problem placed stress on your relationships with members of your family or friends? 0 = No  E23. Because of your problem, are you depressed?  0 = No  F24. Does your problem interfere with your job or household responsibilities?  4 = Yes  P25. Does bending over increase your problem?  4 = Yes  TOTAL 46    DHI Scoring Instructions  The patient is asked to answer each question as it pertains to dizziness or unsteadiness problems, specifically  considering their condition during the last month. Questions are designed to incorporate functional (F), physical  (P), and emotional (E) impacts on disability.   Scores greater than 10 points should be referred to balance specialists for further evaluation.   16-34 Points (mild  handicap)  36-52 Points (moderate handicap)  54+ Points (severe handicap)  Minimally Detectable Change: 17 points ([address] [place], 1990)  [place]esty, G. SHAUNNA. and [place], C. W. (1990). The development of the Dizziness Handicap Inventory. Archives of Otolaryngology - Head and Neck Surgery 116(4): [id number].   VESTIBULAR ASSESSMENT:  GENERAL OBSERVATION: No acute distress   SYMPTOM BEHAVIOR:  Subjective history: Has had multiple falls in the past 6 months, has hit his head per report.  Had cataract surgery about 1-2 years ago.  Has glassed for reading (not used in testing)  Non-Vestibular symptoms: neck pain and tinnitus (pt reports these are not new)  Type of dizziness: Imbalance (Disequilibrium), Spinning/Vertigo, and Unsteady with head/body turns  Frequency: a couple of times of day  Duration: 5 minutes  Aggravating factors: Induced by motion: looking up at the ceiling and turning head quickly  Relieving factors: head stationary and slow movements  Progression of symptoms: unchanged  OCULOMOTOR EXAM:  Ocular Alignment: normal  Ocular ROM: No Limitations  Spontaneous Nystagmus: absent  Gaze-Induced Nystagmus: absent  Smooth Pursuits: intact and brings on dizziness more horizontal than vertical  Saccades: some overshooting laterally to left, slowed to come back to midline; no symptoms  Convergence/Divergence: 2 cm   Cover-cross-cover test: NT   VESTIBULAR - OCULAR REFLEX:   Slow VOR: Positive Bilaterally and Comment: hard to coordinate, increases dizziness; negative vertically  VOR Cancellation: Comment: brings on dizziness and confusion  Head-Impulse Test: HIT Right: difficulty  HIT Left: difficulty  Unable to assess HIT due to pt's guarding in neck  Dynamic Visual Acuity: NT   POSITIONAL TESTING: Other: NT due to time constraints in eval and pt not reporting positional changes bringing on spinning sensations  MOTION SENSITIVITY:  NOT TESTED AT EVAL  Motion Sensitivity  Quotient Intensity: 0 = none, 1 = Lightheaded, 2 = Mild, 3 = Moderate,  4 = Severe, 5 = Vomiting  Intensity  1. Sitting to supine   2. Supine to L side   3. Supine to R side   4. Supine to sitting   5. L Hallpike-Dix   6. Up from L    7. R Hallpike-Dix   8. Up from R    9. Sitting, head tipped to L knee   10. Head up from L knee   11. Sitting, head tipped to R knee   12. Head up from R knee   13. Sitting head turns x5   14.Sitting head nods x5   15. In stance, 180 turn to L    16. In stance, 180 turn to R     OTHOSTATICS: not done  FUNCTIONAL GAIT: MCTSIB Attempted  M-CTSIB  Condition 1: Firm Surface, EO 10 Sec, Severe Sway  Condition 2: Firm Surface, EC 0 Sec, Severe Sway  Condition 3: Foam Surface, EO unable Sec, NT Sway  Condition 4: Foam Surface, EC unable Sec, NT Sway                                                                                                                              TREATMENT DATE: 01/17/2024    GOALS: Goals reviewed with patient? Yes  SHORT TERM GOALS: Target date: 02/15/2024  Pt will be supervision with HEP for improved dizziness, balance. Baseline: Goal status: INITIAL   LONG TERM GOALS: Target date: 02/29/2024  Pt will be supervision with HEP for improved dizziness, balance. Baseline:  Goal status: INITIAL  2.  Pt will improve gait velocity to at least 2 ft/sec for improved gait efficiency and safety. Baseline: 1.58 ft/sec Goal status: INITIAL  3.  DHI score to improve to less than or equal to 28 to demo decreased dizziness impacting ADLs. Baseline:  Goal status: INITIAL  4.  Pt will improve Berg score to at least 45/56 to decrease fall risk. Baseline: 32/56 Goal status: INITIAL  5.  Pt will verbalize understanding of fall prevention in home environment. Baseline:  Goal status: INITIAL  ASSESSMENT:  CLINICAL IMPRESSION: Pt presents today with reports of additional fall in the bathroom. Skilled PT session focused  on education on safety/fall prevention, review and progression of HEP.  With corner balance exercises, pt is very unsteady and is not able to rely accurately on ankle/hip strategy; worked on hip strategy with wall bumps, but pt has decreased eccentric control.  Also worked on step strategy, but again, pt very quick to return to midline, decreased control and tendency to lose balance posteriorly.  Knee pain limits standing exericses and upon inspection, R knee is swollen inferior to patella and educated pt to ice and to follow up with MD as needed, since he has had additional falls on that knee.  Pt will continue to benefit from skilled PT towards goals for improved functional mobility and decreased fall risk.    OBJECTIVE IMPAIRMENTS: Abnormal gait, decreased  balance, decreased mobility, difficulty walking, decreased strength, and dizziness.   ACTIVITY LIMITATIONS: standing, transfers, and locomotion level  PARTICIPATION LIMITATIONS: meal prep, cleaning, laundry, and community activity  PERSONAL FACTORS: 3+ comorbidities: see above are also affecting patient's functional outcome.   REHAB POTENTIAL: Good  CLINICAL DECISION MAKING: Stable/uncomplicated  EVALUATION COMPLEXITY: Low   PLAN:  PT FREQUENCY: 1-2x/week  PT DURATION: 6 weeks plus eval  PLANNED INTERVENTIONS: 97750- Physical Performance Testing, 97110-Therapeutic exercises, 97530- Therapeutic activity, 97112- Neuromuscular re-education, 97535- Self Care, 02859- Manual therapy, [phone]- Gait training, [phone]- Canalith repositioning, Patient/Family education, Balance training, and Vestibular training  PLAN FOR NEXT SESSION: Reinforce safety awareness and fall prevention, review and progress HEP-neck flexibility, habituation and VOR exercises, balance   Sebastion Jun W., PT 02/05/2024, 1:17 PM   [facility] [address] [place], [place] [address] Phone # [phone] Fax #  [phone]

## 2024-02-12 ENCOUNTER — Encounter: Payer: Self-pay | Admitting: Physical Therapy

## 2024-02-12 ENCOUNTER — Ambulatory Visit: Attending: Internal Medicine | Admitting: Physical Therapy

## 2024-02-12 DIAGNOSIS — R2689 Other abnormalities of gait and mobility: Secondary | ICD-10-CM | POA: Insufficient documentation

## 2024-02-12 DIAGNOSIS — R2681 Unsteadiness on feet: Secondary | ICD-10-CM | POA: Diagnosis present

## 2024-02-12 DIAGNOSIS — R42 Dizziness and giddiness: Secondary | ICD-10-CM | POA: Insufficient documentation

## 2024-02-12 NOTE — Therapy (Signed)
 OUTPATIENT PHYSICAL THERAPY VESTIBULAR TREATMENT     Patient Name: Ronald Stafford MRN: [id number] DOB:[birth year], 89 y.o., male Today's Date: 02/12/2024  END OF SESSION:  PT End of Session - 02/12/24 1231     Visit Number 4    Number of Visits 13    Date for Recertification  02/29/24    Authorization Type [facility] Medicare/Medicaid    Authorization Time Period 01/17/24-02/28/24    Authorization - Visit Number 4    Authorization - Number of Visits 13    Progress Note Due on Visit 10    PT Start Time 1234    PT Stop Time 1312    PT Time Calculation (min) 38 min    Equipment Utilized During Treatment Gait belt    Activity Tolerance Patient tolerated treatment well    Behavior During Therapy WFL for tasks assessed/performed            Past Medical History:  Diagnosis Date   Cancer (HCC) 2004   prostate cancer, Dr. Alline   Colon polyps    Deep venous thrombosis (HCC) 1997   Postop   Depression    GERD (gastroesophageal reflux disease)    Hyperlipidemia    Hypertension    Radial head fracture, closed 09/18/2013   Shoulder fracture, left 02/23/2011   immobilized in sling , Dr Norris(no surgery)   Past Surgical History:  Procedure Laterality Date   COLONOSCOPY  2006   Negative,Dr.Edwards   colonoscopy with polypectomy      PMH of   ESOPHAGOGASTRODUODENOSCOPY N/A 02/23/2017   Procedure: ESOPHAGOGASTRODUODENOSCOPY (EGD);  Surgeon: Elicia Claw, MD;  Location: [facility];  Service: Gastroenterology;  Laterality: N/A;   JOINT REPLACEMENT  1997, 2001   Total Hip replacement X 2   LUMBAR SPINE SURGERY  09/28/2010   Dr Burnetta; for spinal stenosis   MECKEL DIVERTICULUM EXCISION     Age 63   PROSTATECTOMY  2004   w/ radiation, Dr Alline   TOTAL HIP REVISION Left 02/14/2017   Procedure: Left hip polyethylene EXCHANGE;  Surgeon: Melodi Lerner, MD;  Location: [facility];  Service: Orthopedics;  Laterality: Left;   VASECTOMY     Patient Active Problem List   Diagnosis Date  Noted   Left foot drop 01/24/2024   Right foot drop 01/01/2024   Acute pain of right knee 01/01/2024   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/25/2023   Bilateral amaurosis fugax 09/25/2023   Lumbar radiculopathy, right 08/10/2023   Right lower quadrant pain 07/20/2023   Difficulty urinating 05/14/2023   Right buttock pain 05/30/2022   Frequent falls 05/09/2022   PAD (peripheral artery disease) 04/07/2022   Chest pain 04/06/2022   Carpal tunnel syndrome, right upper limb 05/24/2021   Cubital tunnel syndrome on right 05/24/2021   Renal cyst, left 06/15/2020   Gross hematuria 06/15/2020   Cervical radiculopathy 06/01/2020   Memory difficulty 06/01/2020   Vertigo 01/03/2019   Urinary incontinence 10/31/2018   Right hand paresthesia 08/10/2017   Skin abnormalities 06/13/2017   History of total hip arthroplasty 03/28/2017   Failed total hip arthroplasty 02/14/2017   Osteoarthritis of left hip 12/10/2016   Left shoulder pain 10/04/2016   History of DVT (deep vein thrombosis) 07/30/2016   History of TIA (transient ischemic attack) 07/30/2016   Bicytopenia 07/30/2016   Carotid arterial disease 07/20/2016   Right sided weakness 06/27/2016   Frequent headaches 06/22/2015   Prediabetes 02/11/2015   Peroneal tendonitis of right lower extremity 01/12/2014   Mass of right ankle 12/31/2013  Lung nodule, solitary 08/08/2012   SPINAL STENOSIS, LUMBAR 04/06/2010   FOOT DROP, RIGHT 05/21/2008   Hypercholesterolemia 12/25/2006   Essential hypertension 12/25/2006   PROSTATE CANCER, HX OF 12/25/2006   POLYP, COLON 05/02/2006    PCP: Geofm Glade PARAS, MD  REFERRING PROVIDER: Geofm Glade PARAS, MD *Also has order from Dr. Artist Lloyd  REFERRING DIAG: R42 (ICD-10-CM) - Vertigo  M25.561,G89.29 (ICD-10-CM) - Chronic pain of right knee (Dr. Lloyd) THERAPY DIAG:  Dizziness and giddiness  Unsteadiness on feet  Other abnormalities of gait and mobility  ONSET DATE: 01/01/2024  Rationale for Evaluation and  Treatment: Rehabilitation  SUBJECTIVE:   SUBJECTIVE STATEMENT: No additional falls.  Knee is a little better with the swelling.  Used an ice pack. Pt accompanied by: caregiver  PERTINENT HISTORY: R foot drop wears AFO, chronic dizziness; hx of falls, PAD, hx of TIA  PAIN:  Are you having pain? No pain today.  PRECAUTIONS: Fall  RED FLAGS: None   WEIGHT BEARING RESTRICTIONS: No  FALLS: Has patient fallen in last 6 months? Yes. Number of falls a bunch  LIVING ENVIRONMENT: Lives with: lives alone and has caregiver come in twice a week Lives in: House/apartment Stairs: ramp to enter home Has following equipment at home: Vannie - 2 wheeled, Environmental Consultant - 4 wheeled, and 3-wheeled RW  PLOF: Independent with household mobility with device and Independent with community mobility with device  PATIENT GOALS: To help with the dizziness  OBJECTIVE:    TODAY'S TREATMENT: 02/12/2024 Activity Comments  Reviewed seated head turns added to HEP last visit Good return demo, no dizziness  Review of HEP: Corner balance EO + head turns, Romberg, then EC Partial heel/toe EO   Performs at counter-cues for safety with intermittent UE support-tends to lose balance and reaches for support with UE support.  PT provides cues to use consistent UE support for stability  -Forward/back walking at counter, 3 reps -Sidestepping 3 reps -Marching in place 2 x 5 reps -Marching forward along counter, 3 reps Cues for optimal foot clearance, step length for balance Reports fatigue  Forward step over obstacles, 2 x 5 reps Sidestep over obstacles 10 reps Back step and weightshift 2 x 5 reps-fatigues midway through set Cues to look ahead   Forward/back step and weightshift 10 reps BUE support  Feet apart EC head turns x 5, head nods x 5 Mild dizziness initially, repeated 2 additional sets with less dizziness each repetition  No complaints of dizziness with standing exercises today Able to ambulate out of session  with rollator, with head turns to participate in conversation, no dizziness reported   TREATMENT: 02/05/2024 Activity Comments  Reviewed HEP: Corner balance Romberg EO and EC and partial tandem stance Pt reports dizziness and imbalance during these exercises  Wall bumps to work on hip strategy Cues for technique, for eccentric control  Worked with slightly wider BOS and with hip strategy wall bumps to try to improve balance in narrowed BOS  To try to utilize hip strategy as part of balance recovery-cues for eccentric control  Side step and weightshift 2 x 10 Decreased eccentric control and slides foot back to center-PT provides multimodal cues for technique and for use of UE support  Back step and weightshift 10 reps Cues for BUE support and for technique/control and pt c/o R knee pain  Seated hamstring stretch 3 x 15 sec  Good stretch, no c/o pain  Seated head motions 3 x 5 reps  Mild dizziness, saccades noted-dizziness decreases  Seated head nods 5 reps No dizziness   HOME EXERCISE PROGRAM: Access Code: [email or web address] URL: [email or web address] Date: 02/05/2024 Prepared by: [facility] Outpatient  Rehab - [facility]  Exercises - Corner Balance Feet Together With Eyes Open  - 1 x daily - 3 reps - 30 sec hold - Corner Balance Feet Together With Eyes Closed  - 1 x daily - 7 x weekly - 3 reps - 30 sec hold - Corner Balance Feet Together: Eyes Open With Head Turns  - 1 x daily - 7 x weekly - 3 reps - 15-30 sec hold - Semi-Tandem Corner Balance With Eyes Open  - 1 x daily - 7 x weekly - 3 reps - 15 sec hold - Seated Left/Right Head Turns Vestibular Habituation  - 1-2 x daily - 7 x weekly - 3 sets - 5 reps    PATIENT EDUCATION: Education details: Benefits of consistent HEP performance with cues for safety-to use consistent UE support Person educated: Patient  Education method: Explanation Education comprehension: verbalized  understanding   ----------------------------------------------- Note: Objective measures were completed at Evaluation unless otherwise noted.  DIAGNOSTIC FINDINGS: 01/08/2024:  X-ray images right knee obtained today personally and independently interpreted. Mild medial DJD.  No acute fractures are visible. MRI 10/2023:  IMPRESSION: 1. Small acute to early subacute right MCA infarcts. 2. Moderate chronic small vessel ischemic disease. 3. Numerous chronic cerebral microhemorrhages and mild superficial siderosis, possibly reflecting cerebral amyloid angiopathy. 4. 13 mm extra-axial mass in the posterior fossa most consistent with a meningioma. No significant mass effect or brain edema. 5. No major intracranial arterial occlusion or significant proximal intracranial stenosis. 6. 35% stenosis of the proximal right ICA. 7. 65% stenosis of the proximal left subclavian artery. 8. Limited assessment of the proximal V1 segment of the left vertebral artery. A severe stenosis or short segment occlusion is not excluded.  COGNITION: Overall cognitive status: some word finding difficulty, pt reports sometimes getting confused with head motions   SENSATION: Light touch: Impaired    POSTURE:  rounded shoulders and forward head  Cervical ROM:    Active A/PROM (deg) eval  Flexion 20 with pain  Extension 18 with pain  Right lateral flexion   Left lateral flexion   Right rotation 20 pain  Left rotation 25 pain  (Blank rows = not tested)  MMT:  R hip flexion, knee flexion and knee extension 3+/5 L 4/5  R ankle dorsiflexion 3-/5 RLE, 3/5 LLE  BED MOBILITY:  NT  TRANSFERS: Assistive device utilized: use of BUEs  Sit to stand: SBA Stand to sit: SBA Unsteady upon standing GAIT: Gait pattern: has AFO, but not wearing today, step through pattern, decreased step length- Right, decreased stance time- Right, decreased ankle dorsiflexion- Right, Right steppage, wide BOS, and poor foot  clearance- Right Distance walked: 50 ft Assistive device utilized: 3-wheeled RW Level of assistance: SBA Comments: not wearing AFO today  FUNCTIONAL TESTS:  10 meter walk test: 20.78 sec = 1.58 ft/sec  PATIENT SURVEYS:  DHI: THE DIZZINESS HANDICAP INVENTORY (DHI)  P1. Does looking up increase your problem? 2 = Sometimes  E2. Because of your problem, do you feel frustrated? 2 = Sometimes  F3. Because of your problem, do you restrict your travel for business or recreation?  4 = Yes  P4. Does walking down the aisle of a supermarket increase your problems?  4 = Yes  F5. Because of your problem, do you have difficulty getting into or out of bed?  0 = No  F6. Does your problem significantly restrict your participation in social activities, such as going out to dinner, going to the movies, dancing, or going to parties? 4 = Yes  F7. Because of your problem, do you have difficulty reading?  4 = Yes  P8. Does performing more ambitious activities such as sports, dancing, household chores (sweeping or putting dishes away) increase your problems?  0 = No  E9. Because of your problem, are you afraid to leave your home without having without having someone accompany you?  0 = No  E10. Because of your problem have you been embarrassed in front of others?  2 = Sometimes  P11. Do quick movements of your head increase your problem?  4 = Yes  F12. Because of your problem, do you avoid heights?  4 = Yes  P13. Does turning over in bed increase your problem?  0 = No  F14. Because of your problem, is it difficult for you to do strenuous homework or yard work? 4 = Yes  E15. Because of your problem, are you afraid people may think you are intoxicated? 0 = No  F16. Because of your problem, is it difficult for you to go for a walk by yourself?  0 = No  P17. Does walking down a sidewalk increase your problem?  0 = No  E18.Because of your problem, is it difficult for you to concentrate 0 = No  F19. Because of your  problem, is it difficult for you to walk around your house in the dark? 4 = Yes  E20. Because of your problem, are you afraid to stay home alone?  0 = No  E21. Because of your problem, do you feel handicapped? 0 = No  E22. Has the problem placed stress on your relationships with members of your family or friends? 0 = No  E23. Because of your problem, are you depressed?  0 = No  F24. Does your problem interfere with your job or household responsibilities?  4 = Yes  P25. Does bending over increase your problem?  4 = Yes  TOTAL 46    DHI Scoring Instructions  The patient is asked to answer each question as it pertains to dizziness or unsteadiness problems, specifically  considering their condition during the last month. Questions are designed to incorporate functional (F), physical  (P), and emotional (E) impacts on disability.   Scores greater than 10 points should be referred to balance specialists for further evaluation.   16-34 Points (mild handicap)  36-52 Points (moderate handicap)  54+ Points (severe handicap)  Minimally Detectable Change: 17 points ([address] [place], 1990)  Ashkum, G. SHAUNNA. and Valentine, C. W. (1990). The development of the Dizziness Handicap Inventory. Archives of Otolaryngology - Head and Neck Surgery 116(4): [id number].   VESTIBULAR ASSESSMENT:  GENERAL OBSERVATION: No acute distress   SYMPTOM BEHAVIOR:  Subjective history: Has had multiple falls in the past 6 months, has hit his head per report.  Had cataract surgery about 1-2 years ago.  Has glassed for reading (not used in testing)  Non-Vestibular symptoms: neck pain and tinnitus (pt reports these are not new)  Type of dizziness: Imbalance (Disequilibrium), Spinning/Vertigo, and Unsteady with head/body turns  Frequency: a couple of times of day  Duration: 5 minutes  Aggravating factors: Induced by motion: looking up at the ceiling and turning head quickly  Relieving factors: head stationary and slow  movements  Progression of symptoms: unchanged  OCULOMOTOR EXAM:  Ocular Alignment: normal  Ocular ROM: No Limitations  Spontaneous Nystagmus: absent  Gaze-Induced Nystagmus: absent  Smooth Pursuits: intact and brings on dizziness more horizontal than vertical  Saccades: some overshooting laterally to left, slowed to come back to midline; no symptoms  Convergence/Divergence: 2 cm   Cover-cross-cover test: NT   VESTIBULAR - OCULAR REFLEX:   Slow VOR: Positive Bilaterally and Comment: hard to coordinate, increases dizziness; negative vertically  VOR Cancellation: Comment: brings on dizziness and confusion  Head-Impulse Test: HIT Right: difficulty  HIT Left: difficulty  Unable to assess HIT due to pt's guarding in neck  Dynamic Visual Acuity: NT   POSITIONAL TESTING: Other: NT due to time constraints in eval and pt not reporting positional changes bringing on spinning sensations  MOTION SENSITIVITY:  NOT TESTED AT EVAL  Motion Sensitivity Quotient Intensity: 0 = none, 1 = Lightheaded, 2 = Mild, 3 = Moderate, 4 = Severe, 5 = Vomiting  Intensity  1. Sitting to supine   2. Supine to L side   3. Supine to R side   4. Supine to sitting   5. L Hallpike-Dix   6. Up from L    7. R Hallpike-Dix   8. Up from R    9. Sitting, head tipped to L knee   10. Head up from L knee   11. Sitting, head tipped to R knee   12. Head up from R knee   13. Sitting head turns x5   14.Sitting head nods x5   15. In stance, 180 turn to L    16. In stance, 180 turn to R     OTHOSTATICS: not done  FUNCTIONAL GAIT: MCTSIB Attempted  M-CTSIB  Condition 1: Firm Surface, EO 10 Sec, Severe Sway  Condition 2: Firm Surface, EC 0 Sec, Severe Sway  Condition 3: Foam Surface, EO unable Sec, NT Sway  Condition 4: Foam Surface, EC unable Sec, NT Sway                                                                                                                              TREATMENT DATE:  01/17/2024    GOALS: Goals reviewed with patient? Yes  SHORT TERM GOALS: Target date: 02/15/2024  Pt will be supervision with HEP for improved dizziness, balance. Baseline:supervision and cues Goal status: MET 02/12/2024   LONG TERM GOALS: Target date: 02/29/2024  Pt will be supervision with HEP for improved dizziness, balance. Baseline:  Goal status: IN PROGRESS  2.  Pt will improve gait velocity to at least 2 ft/sec for improved gait efficiency and safety. Baseline: 1.58 ft/sec Goal status: IN PROGRESS  3.  DHI score to improve to less than or equal to 28 to demo decreased dizziness impacting ADLs. Baseline:  Goal status: IN PROGRESS  4.  Pt will improve Berg score to at least 45/56 to decrease fall risk. Baseline: 32/56 Goal status: IN PROGRESS  5.  Pt will verbalize  understanding of fall prevention in home environment. Baseline:  Goal status: IN PROGRESS  ASSESSMENT:  CLINICAL IMPRESSION: Pt presents today with no new complaints and no new falls. Skilled PT session focused on review of HEP, with pt needing supervision and cues, as he tends to let go of UE support, loses balance, then reaches back out for support.  PT cues pt to use consistent UE support for optimal stability.  Worked on dynamic balance exercises, with cues occasionally to look ahead (and not look at the ground).  With seated head turns, pt notes no dizziness.  At end of session today, worked on standing feet apart/EC and head motions, with pt initially reporting dizziness.  Once it settles and pt repeats 2 additional sets, pt notes less dizziness each time.  Did not yet give this exercise for home, due to pt's safety/stability, but it is reassuring to have pt repeat head motions with less dizziness throughout.   Pt will continue to benefit from skilled PT towards goals for improved functional mobility and decreased fall risk.   OBJECTIVE IMPAIRMENTS: Abnormal gait, decreased balance, decreased mobility,  difficulty walking, decreased strength, and dizziness.   ACTIVITY LIMITATIONS: standing, transfers, and locomotion level  PARTICIPATION LIMITATIONS: meal prep, cleaning, laundry, and community activity  PERSONAL FACTORS: 3+ comorbidities: see above are also affecting patient's functional outcome.   REHAB POTENTIAL: Good  CLINICAL DECISION MAKING: Stable/uncomplicated  EVALUATION COMPLEXITY: Low   PLAN:  PT FREQUENCY: 1-2x/week  PT DURATION: 6 weeks plus eval  PLANNED INTERVENTIONS: 97750- Physical Performance Testing, 97110-Therapeutic exercises, 97530- Therapeutic activity, 97112- Neuromuscular re-education, 97535- Self Care, 02859- Manual therapy, [phone]- Gait training, [phone]- Canalith repositioning, Patient/Family education, Balance training, and Vestibular training  PLAN FOR NEXT SESSION: Reinforce safety awareness and fall prevention, review and progress HEP-neck flexibility, habituation and VOR exercises, balance   Kaire Stary W., PT 02/12/2024, 12:32 PM   [facility] [address] [place], [place] [address] Phone # [phone] Fax # [phone]

## 2024-02-14 ENCOUNTER — Ambulatory Visit: Admitting: Physical Therapy

## 2024-02-15 ENCOUNTER — Ambulatory Visit

## 2024-02-15 DIAGNOSIS — R42 Dizziness and giddiness: Secondary | ICD-10-CM

## 2024-02-15 DIAGNOSIS — R2689 Other abnormalities of gait and mobility: Secondary | ICD-10-CM

## 2024-02-15 DIAGNOSIS — R2681 Unsteadiness on feet: Secondary | ICD-10-CM

## 2024-02-15 NOTE — Therapy (Signed)
 OUTPATIENT PHYSICAL THERAPY VESTIBULAR TREATMENT     Patient Name: Ronald Stafford MRN: [id number] DOB:September 05, 1930, 89 y.o., male Today's Date: 02/15/2024  END OF SESSION:  PT End of Session - 02/15/24 1019     Visit Number 5    Number of Visits 13    Date for Recertification  02/29/24    Authorization Type [facility] Medicare/Medicaid    Authorization Time Period 01/17/24-02/28/24    Authorization - Visit Number 5    Authorization - Number of Visits 13    Progress Note Due on Visit 10    PT Start Time 1018    PT Stop Time 1100    PT Time Calculation (min) 42 min    Equipment Utilized During Treatment Gait belt    Activity Tolerance Patient tolerated treatment well    Behavior During Therapy WFL for tasks assessed/performed            Past Medical History:  Diagnosis Date   Cancer (HCC) 2004   prostate cancer, Dr. Alline   Colon polyps    Deep venous thrombosis (HCC) 1997   Postop   Depression    GERD (gastroesophageal reflux disease)    Hyperlipidemia    Hypertension    Radial head fracture, closed 09/18/2013   Shoulder fracture, left 02/23/2011   immobilized in sling , Dr Norris(no surgery)   Past Surgical History:  Procedure Laterality Date   COLONOSCOPY  2006   Negative,Dr.Edwards   colonoscopy with polypectomy      PMH of   ESOPHAGOGASTRODUODENOSCOPY N/A 02/23/2017   Procedure: ESOPHAGOGASTRODUODENOSCOPY (EGD);  Surgeon: Elicia Claw, MD;  Location: [facility];  Service: Gastroenterology;  Laterality: N/A;   JOINT REPLACEMENT  1997, 2001   Total Hip replacement X 2   LUMBAR SPINE SURGERY  09/28/2010   Dr Burnetta; for spinal stenosis   MECKEL DIVERTICULUM EXCISION     Age 43   PROSTATECTOMY  2004   w/ radiation, Dr Alline   TOTAL HIP REVISION Left 02/14/2017   Procedure: Left hip polyethylene EXCHANGE;  Surgeon: Melodi Lerner, MD;  Location: [facility];  Service: Orthopedics;  Laterality: Left;   VASECTOMY     Patient Active Problem List   Diagnosis Date  Noted   Left foot drop 01/24/2024   Right foot drop 01/01/2024   Acute pain of right knee 01/01/2024   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/25/2023   Bilateral amaurosis fugax 09/25/2023   Lumbar radiculopathy, right 08/10/2023   Right lower quadrant pain 07/20/2023   Difficulty urinating 05/14/2023   Right buttock pain 05/30/2022   Frequent falls 05/09/2022   PAD (peripheral artery disease) 04/07/2022   Chest pain 04/06/2022   Carpal tunnel syndrome, right upper limb 05/24/2021   Cubital tunnel syndrome on right 05/24/2021   Renal cyst, left 06/15/2020   Gross hematuria 06/15/2020   Cervical radiculopathy 06/01/2020   Memory difficulty 06/01/2020   Vertigo 01/03/2019   Urinary incontinence 10/31/2018   Right hand paresthesia 08/10/2017   Skin abnormalities 06/13/2017   History of total hip arthroplasty 03/28/2017   Failed total hip arthroplasty 02/14/2017   Osteoarthritis of left hip 12/10/2016   Left shoulder pain 10/04/2016   History of DVT (deep vein thrombosis) 07/30/2016   History of TIA (transient ischemic attack) 07/30/2016   Bicytopenia 07/30/2016   Carotid arterial disease 07/20/2016   Right sided weakness 06/27/2016   Frequent headaches 06/22/2015   Prediabetes 02/11/2015   Peroneal tendonitis of right lower extremity 01/12/2014   Mass of right ankle 12/31/2013  Lung nodule, solitary 08/08/2012   SPINAL STENOSIS, LUMBAR 04/06/2010   FOOT DROP, RIGHT 05/21/2008   Hypercholesterolemia 12/25/2006   Essential hypertension 12/25/2006   PROSTATE CANCER, HX OF 12/25/2006   POLYP, COLON 05/02/2006    PCP: Geofm Glade PARAS, MD  REFERRING PROVIDER: Geofm Glade PARAS, MD *Also has order from Dr. Artist Lloyd  REFERRING DIAG: R42 (ICD-10-CM) - Vertigo  M25.561,G89.29 (ICD-10-CM) - Chronic pain of right knee (Dr. Lloyd) THERAPY DIAG:  Dizziness and giddiness  Unsteadiness on feet  Other abnormalities of gait and mobility  ONSET DATE: 01/01/2024  Rationale for Evaluation and  Treatment: Rehabilitation  SUBJECTIVE:   SUBJECTIVE STATEMENT: The right knee is still a little troublesome.  Dizziness hasn't been as bad lately.  Pt accompanied by: caregiver  PERTINENT HISTORY: R foot drop wears AFO, chronic dizziness; hx of falls, PAD, hx of TIA  PAIN:  Are you having pain? No pain today.  PRECAUTIONS: Fall  RED FLAGS: None   WEIGHT BEARING RESTRICTIONS: No  FALLS: Has patient fallen in last 6 months? Yes. Number of falls a bunch  LIVING ENVIRONMENT: Lives with: lives alone and has caregiver come in twice a week Lives in: House/apartment Stairs: ramp to enter home Has following equipment at home: Vannie - 2 wheeled, Environmental Consultant - 4 wheeled, and 3-wheeled RW  PLOF: Independent with household mobility with device and Independent with community mobility with device  PATIENT GOALS: To help with the dizziness  OBJECTIVE:   TODAY'S TREATMENT: 02/15/24 Activity Comments  NU-step level 4 x 6 min For dynamic warm-up  LE PRE 3x10, 4# ankle weights    Static multisensory balance Mod A for eyes closed conditions              TODAY'S TREATMENT: 02/12/2024 Activity Comments  Reviewed seated head turns added to HEP last visit Good return demo, no dizziness  Review of HEP: Corner balance EO + head turns, Romberg, then EC Partial heel/toe EO   Performs at counter-cues for safety with intermittent UE support-tends to lose balance and reaches for support with UE support.  PT provides cues to use consistent UE support for stability  -Forward/back walking at counter, 3 reps -Sidestepping 3 reps -Marching in place 2 x 5 reps -Marching forward along counter, 3 reps Cues for optimal foot clearance, step length for balance Reports fatigue  Forward step over obstacles, 2 x 5 reps Sidestep over obstacles 10 reps Back step and weightshift 2 x 5 reps-fatigues midway through set Cues to look ahead   Forward/back step and weightshift 10 reps BUE support  Feet apart EC  head turns x 5, head nods x 5 Mild dizziness initially, repeated 2 additional sets with less dizziness each repetition  No complaints of dizziness with standing exercises today Able to ambulate out of session with rollator, with head turns to participate in conversation, no dizziness reported   TREATMENT: 02/05/2024 Activity Comments  Reviewed HEP: Corner balance Romberg EO and EC and partial tandem stance Pt reports dizziness and imbalance during these exercises  Wall bumps to work on hip strategy Cues for technique, for eccentric control  Worked with slightly wider BOS and with hip strategy wall bumps to try to improve balance in narrowed BOS  To try to utilize hip strategy as part of balance recovery-cues for eccentric control  Side step and weightshift 2 x 10 Decreased eccentric control and slides foot back to center-PT provides multimodal cues for technique and for use of UE support  Back step  and weightshift 10 reps Cues for BUE support and for technique/control and pt c/o R knee pain  Seated hamstring stretch 3 x 15 sec  Good stretch, no c/o pain  Seated head motions 3 x 5 reps  Mild dizziness, saccades noted-dizziness decreases  Seated head nods 5 reps No dizziness   HOME EXERCISE PROGRAM: Access Code: [email or web address] URL: [email or web address] Date: 02/05/2024 Prepared by: [facility] - Outpatient  Rehab - [facility]  Exercises - Corner Balance Feet Together With Eyes Open  - 1 x daily - 3 reps - 30 sec hold - Corner Balance Feet Together With Eyes Closed  - 1 x daily - 7 x weekly - 3 reps - 30 sec hold - Corner Balance Feet Together: Eyes Open With Head Turns  - 1 x daily - 7 x weekly - 3 reps - 15-30 sec hold - Semi-Tandem Corner Balance With Eyes Open  - 1 x daily - 7 x weekly - 3 reps - 15 sec hold - Seated Left/Right Head Turns Vestibular Habituation  - 1-2 x daily - 7 x weekly - 3 sets - 5 reps    PATIENT EDUCATION: Education details: Benefits of consistent  HEP performance with cues for safety-to use consistent UE support Person educated: Patient  Education method: Explanation Education comprehension: verbalized understanding   ----------------------------------------------- Note: Objective measures were completed at Evaluation unless otherwise noted.  DIAGNOSTIC FINDINGS: 01/08/2024:  X-ray images right knee obtained today personally and independently interpreted. Mild medial DJD.  No acute fractures are visible. MRI 10/2023:  IMPRESSION: 1. Small acute to early subacute right MCA infarcts. 2. Moderate chronic small vessel ischemic disease. 3. Numerous chronic cerebral microhemorrhages and mild superficial siderosis, possibly reflecting cerebral amyloid angiopathy. 4. 13 mm extra-axial mass in the posterior fossa most consistent with a meningioma. No significant mass effect or brain edema. 5. No major intracranial arterial occlusion or significant proximal intracranial stenosis. 6. 35% stenosis of the proximal right ICA. 7. 65% stenosis of the proximal left subclavian artery. 8. Limited assessment of the proximal V1 segment of the left vertebral artery. A severe stenosis or short segment occlusion is not excluded.  COGNITION: Overall cognitive status: some word finding difficulty, pt reports sometimes getting confused with head motions   SENSATION: Light touch: Impaired    POSTURE:  rounded shoulders and forward head  Cervical ROM:    Active A/PROM (deg) eval  Flexion 20 with pain  Extension 18 with pain  Right lateral flexion   Left lateral flexion   Right rotation 20 pain  Left rotation 25 pain  (Blank rows = not tested)  MMT:  R hip flexion, knee flexion and knee extension 3+/5 L 4/5  R ankle dorsiflexion 3-/5 RLE, 3/5 LLE  BED MOBILITY:  NT  TRANSFERS: Assistive device utilized: use of BUEs  Sit to stand: SBA Stand to sit: SBA Unsteady upon standing GAIT: Gait pattern: has AFO, but not wearing today, step  through pattern, decreased step length- Right, decreased stance time- Right, decreased ankle dorsiflexion- Right, Right steppage, wide BOS, and poor foot clearance- Right Distance walked: 50 ft Assistive device utilized: 3-wheeled RW Level of assistance: SBA Comments: not wearing AFO today  FUNCTIONAL TESTS:  10 meter walk test: 20.78 sec = 1.58 ft/sec  PATIENT SURVEYS:  DHI: THE DIZZINESS HANDICAP INVENTORY (DHI)  P1. Does looking up increase your problem? 2 = Sometimes  E2. Because of your problem, do you feel frustrated? 2 = Sometimes  F3. Because of your problem, do  you restrict your travel for business or recreation?  4 = Yes  P4. Does walking down the aisle of a supermarket increase your problems?  4 = Yes  F5. Because of your problem, do you have difficulty getting into or out of bed?  0 = No  F6. Does your problem significantly restrict your participation in social activities, such as going out to dinner, going to the movies, dancing, or going to parties? 4 = Yes  F7. Because of your problem, do you have difficulty reading?  4 = Yes  P8. Does performing more ambitious activities such as sports, dancing, household chores (sweeping or putting dishes away) increase your problems?  0 = No  E9. Because of your problem, are you afraid to leave your home without having without having someone accompany you?  0 = No  E10. Because of your problem have you been embarrassed in front of others?  2 = Sometimes  P11. Do quick movements of your head increase your problem?  4 = Yes  F12. Because of your problem, do you avoid heights?  4 = Yes  P13. Does turning over in bed increase your problem?  0 = No  F14. Because of your problem, is it difficult for you to do strenuous homework or yard work? 4 = Yes  E15. Because of your problem, are you afraid people may think you are intoxicated? 0 = No  F16. Because of your problem, is it difficult for you to go for a walk by yourself?  0 = No  P17. Does  walking down a sidewalk increase your problem?  0 = No  E18.Because of your problem, is it difficult for you to concentrate 0 = No  F19. Because of your problem, is it difficult for you to walk around your house in the dark? 4 = Yes  E20. Because of your problem, are you afraid to stay home alone?  0 = No  E21. Because of your problem, do you feel handicapped? 0 = No  E22. Has the problem placed stress on your relationships with members of your family or friends? 0 = No  E23. Because of your problem, are you depressed?  0 = No  F24. Does your problem interfere with your job or household responsibilities?  4 = Yes  P25. Does bending over increase your problem?  4 = Yes  TOTAL 46    DHI Scoring Instructions  The patient is asked to answer each question as it pertains to dizziness or unsteadiness problems, specifically  considering their condition during the last month. Questions are designed to incorporate functional (F), physical  (P), and emotional (E) impacts on disability.   Scores greater than 10 points should be referred to balance specialists for further evaluation.   16-34 Points (mild handicap)  36-52 Points (moderate handicap)  54+ Points (severe handicap)  Minimally Detectable Change: 17 points ([address] [place], 1990)  [place]als[place], G. SHAUNNA. and Callaway, C. W. (1990). The development of the Dizziness Handicap Inventory. Archives of Otolaryngology - Head and Neck Surgery 116(4): [id number].   VESTIBULAR ASSESSMENT:  GENERAL OBSERVATION: No acute distress   SYMPTOM BEHAVIOR:  Subjective history: Has had multiple falls in the past 6 months, has hit his head per report.  Had cataract surgery about 1-2 years ago.  Has glassed for reading (not used in testing)  Non-Vestibular symptoms: neck pain and tinnitus (pt reports these are not new)  Type of dizziness: Imbalance (Disequilibrium), Spinning/Vertigo, and Unsteady with head/body  turns  Frequency: a couple of times of day  Duration:  5 minutes  Aggravating factors: Induced by motion: looking up at the ceiling and turning head quickly  Relieving factors: head stationary and slow movements  Progression of symptoms: unchanged  OCULOMOTOR EXAM:  Ocular Alignment: normal  Ocular ROM: No Limitations  Spontaneous Nystagmus: absent  Gaze-Induced Nystagmus: absent  Smooth Pursuits: intact and brings on dizziness more horizontal than vertical  Saccades: some overshooting laterally to left, slowed to come back to midline; no symptoms  Convergence/Divergence: 2 cm   Cover-cross-cover test: NT   VESTIBULAR - OCULAR REFLEX:   Slow VOR: Positive Bilaterally and Comment: hard to coordinate, increases dizziness; negative vertically  VOR Cancellation: Comment: brings on dizziness and confusion  Head-Impulse Test: HIT Right: difficulty  HIT Left: difficulty  Unable to assess HIT due to pt's guarding in neck  Dynamic Visual Acuity: NT   POSITIONAL TESTING: Other: NT due to time constraints in eval and pt not reporting positional changes bringing on spinning sensations  MOTION SENSITIVITY:  NOT TESTED AT EVAL  Motion Sensitivity Quotient Intensity: 0 = none, 1 = Lightheaded, 2 = Mild, 3 = Moderate, 4 = Severe, 5 = Vomiting  Intensity  1. Sitting to supine   2. Supine to L side   3. Supine to R side   4. Supine to sitting   5. L Hallpike-Dix   6. Up from L    7. R Hallpike-Dix   8. Up from R    9. Sitting, head tipped to L knee   10. Head up from L knee   11. Sitting, head tipped to R knee   12. Head up from R knee   13. Sitting head turns x5   14.Sitting head nods x5   15. In stance, 180 turn to L    16. In stance, 180 turn to R     OTHOSTATICS: not done  FUNCTIONAL GAIT: MCTSIB Attempted  M-CTSIB  Condition 1: Firm Surface, EO 10 Sec, Severe Sway  Condition 2: Firm Surface, EC 0 Sec, Severe Sway  Condition 3: Foam Surface, EO unable Sec, NT Sway  Condition 4: Foam Surface, EC unable Sec, NT Sway                                                                                                                               TREATMENT DATE: 01/17/2024    GOALS: Goals reviewed with patient? Yes  SHORT TERM GOALS: Target date: 02/15/2024  Pt will be supervision with HEP for improved dizziness, balance. Baseline:supervision and cues Goal status: MET 02/12/2024   LONG TERM GOALS: Target date: 02/29/2024  Pt will be supervision with HEP for improved dizziness, balance. Baseline:  Goal status: IN PROGRESS  2.  Pt will improve gait velocity to at least 2 ft/sec for improved gait efficiency and safety. Baseline: 1.58 ft/sec Goal status: IN PROGRESS  3.  DHI score to improve to  less than or equal to 28 to demo decreased dizziness impacting ADLs. Baseline:  Goal status: IN PROGRESS  4.  Pt will improve Berg score to at least 45/56 to decrease fall risk. Baseline: 32/56 Goal status: IN PROGRESS  5.  Pt will verbalize understanding of fall prevention in home environment. Baseline:  Goal status: IN PROGRESS  ASSESSMENT:  CLINICAL IMPRESSION: Reports still some discomfort to right knee. Initiated session with LE there ex to improve ROM, strength, and reduce pain to good effect with report 0/10 pain after initial activities.  Proceeded to static multisensory balance activities to improve safety with ADL requiring min-mod A for control/correction to eyes closed demands w/ activities but markedly improved as he can maintain for 30 sec moderate sway.  Pt reports he should be obtaining new AFO in recent future. Pt denies any dizziness during session noting main issue being imbalance. Continued sessions to advance POC details to improve mobility and reduce risk for falls.   OBJECTIVE IMPAIRMENTS: Abnormal gait, decreased balance, decreased mobility, difficulty walking, decreased strength, and dizziness.   ACTIVITY LIMITATIONS: standing, transfers, and locomotion level  PARTICIPATION LIMITATIONS: meal  prep, cleaning, laundry, and community activity  PERSONAL FACTORS: 3+ comorbidities: see above are also affecting patient's functional outcome.   REHAB POTENTIAL: Good  CLINICAL DECISION MAKING: Stable/uncomplicated  EVALUATION COMPLEXITY: Low   PLAN:  PT FREQUENCY: 1-2x/week  PT DURATION: 6 weeks plus eval  PLANNED INTERVENTIONS: 97750- Physical Performance Testing, 97110-Therapeutic exercises, 97530- Therapeutic activity, 97112- Neuromuscular re-education, 97535- Self Care, 02859- Manual therapy, [phone]- Gait training, [phone]- Canalith repositioning, Patient/Family education, Balance training, and Vestibular training  PLAN FOR NEXT SESSION: Reinforce safety awareness and fall prevention, review and progress HEP-neck flexibility, habituation and VOR exercises, balance   Jonette MARLA Sandifer, PT 02/15/2024, 10:19 AM   [facility] at [facility] [address], [address] [place], [place] [address] Phone # [phone] Fax # [phone]

## 2024-02-18 ENCOUNTER — Ambulatory Visit: Admitting: Physical Therapy

## 2024-02-18 NOTE — Therapy (Incomplete)
 OUTPATIENT PHYSICAL THERAPY VESTIBULAR TREATMENT     Patient Name: Ronald Stafford MRN: [id number] DOB:1930-08-17, 89 y.o., male Today's Date: 02/18/2024  END OF SESSION:      Past Medical History:  Diagnosis Date   Cancer [facility]) 2004   prostate cancer, Dr. Alline   Colon polyps    Deep venous thrombosis (HCC) 1997   Postop   Depression    GERD (gastroesophageal reflux disease)    Hyperlipidemia    Hypertension    Radial head fracture, closed 09/18/2013   Shoulder fracture, left 02/23/2011   immobilized in sling , Dr Norris(no surgery)   Past Surgical History:  Procedure Laterality Date   COLONOSCOPY  2006   Negative,Dr.Edwards   colonoscopy with polypectomy      PMH of   ESOPHAGOGASTRODUODENOSCOPY N/A 02/23/2017   Procedure: ESOPHAGOGASTRODUODENOSCOPY (EGD);  Surgeon: Elicia Claw, MD;  Location: [facility];  Service: Gastroenterology;  Laterality: N/A;   JOINT REPLACEMENT  1997, 2001   Total Hip replacement X 2   LUMBAR SPINE SURGERY  09/28/2010   Dr Burnetta; for spinal stenosis   MECKEL DIVERTICULUM EXCISION     Age 60   PROSTATECTOMY  2004   w/ radiation, Dr Alline   TOTAL HIP REVISION Left 02/14/2017   Procedure: Left hip polyethylene EXCHANGE;  Surgeon: Melodi Lerner, MD;  Location: [facility];  Service: Orthopedics;  Laterality: Left;   VASECTOMY     Patient Active Problem List   Diagnosis Date Noted   Left foot drop 01/24/2024   Right foot drop 01/01/2024   Acute pain of right knee 01/01/2024   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/25/2023   Bilateral amaurosis fugax 09/25/2023   Lumbar radiculopathy, right 08/10/2023   Right lower quadrant pain 07/20/2023   Difficulty urinating 05/14/2023   Right buttock pain 05/30/2022   Frequent falls 05/09/2022   PAD (peripheral artery disease) 04/07/2022   Chest pain 04/06/2022   Carpal tunnel syndrome, right upper limb 05/24/2021   Cubital tunnel syndrome on right 05/24/2021   Renal cyst, left 06/15/2020   Gross hematuria  06/15/2020   Cervical radiculopathy 06/01/2020   Memory difficulty 06/01/2020   Vertigo 01/03/2019   Urinary incontinence 10/31/2018   Right hand paresthesia 08/10/2017   Skin abnormalities 06/13/2017   History of total hip arthroplasty 03/28/2017   Failed total hip arthroplasty 02/14/2017   Osteoarthritis of left hip 12/10/2016   Left shoulder pain 10/04/2016   History of DVT (deep vein thrombosis) 07/30/2016   History of TIA (transient ischemic attack) 07/30/2016   Bicytopenia 07/30/2016   Carotid arterial disease 07/20/2016   Right sided weakness 06/27/2016   Frequent headaches 06/22/2015   Prediabetes 02/11/2015   Peroneal tendonitis of right lower extremity 01/12/2014   Mass of right ankle 12/31/2013   Lung nodule, solitary 08/08/2012   SPINAL STENOSIS, LUMBAR 04/06/2010   FOOT DROP, RIGHT 05/21/2008   Hypercholesterolemia 12/25/2006   Essential hypertension 12/25/2006   PROSTATE CANCER, HX OF 12/25/2006   POLYP, COLON 05/02/2006    PCP: Geofm Glade PARAS, MD  REFERRING PROVIDER: Geofm Glade PARAS, MD *Also has order from Dr. Artist Lloyd  REFERRING DIAG: R42 (ICD-10-CM) - Vertigo  M25.561,G89.29 (ICD-10-CM) - Chronic pain of right knee (Dr. Lloyd) THERAPY DIAG:  No diagnosis found.  ONSET DATE: 01/01/2024  Rationale for Evaluation and Treatment: Rehabilitation  SUBJECTIVE:   SUBJECTIVE STATEMENT: ***The right knee is still a little troublesome.  Dizziness hasn't been as bad lately.  Pt accompanied by: caregiver  PERTINENT HISTORY: R foot drop  wears AFO, chronic dizziness; hx of falls, PAD, hx of TIA  PAIN:  Are you having pain? No pain today.  PRECAUTIONS: Fall  RED FLAGS: None   WEIGHT BEARING RESTRICTIONS: No  FALLS: Has patient fallen in last 6 months? Yes. Number of falls a bunch  LIVING ENVIRONMENT: Lives with: lives alone and has caregiver come in twice a week Lives in: House/apartment Stairs: ramp to enter home Has following equipment at home:  Vannie - 2 wheeled, Environmental Consultant - 4 wheeled, and 3-wheeled RW  PLOF: Independent with household mobility with device and Independent with community mobility with device  PATIENT GOALS: To help with the dizziness  OBJECTIVE:    TODAY'S TREATMENT: 02/18/2024 Activity Comments                      TODAY'S TREATMENT: 02/15/24 Activity Comments  NU-step level 4 x 6 min For dynamic warm-up  LE PRE 3x10, 4# ankle weights    Static multisensory balance Mod A for eyes closed conditions              TODAY'S TREATMENT: 02/12/2024 Activity Comments  Reviewed seated head turns added to HEP last visit Good return demo, no dizziness  Review of HEP: Corner balance EO + head turns, Romberg, then EC Partial heel/toe EO   Performs at counter-cues for safety with intermittent UE support-tends to lose balance and reaches for support with UE support.  PT provides cues to use consistent UE support for stability  -Forward/back walking at counter, 3 reps -Sidestepping 3 reps -Marching in place 2 x 5 reps -Marching forward along counter, 3 reps Cues for optimal foot clearance, step length for balance Reports fatigue  Forward step over obstacles, 2 x 5 reps Sidestep over obstacles 10 reps Back step and weightshift 2 x 5 reps-fatigues midway through set Cues to look ahead   Forward/back step and weightshift 10 reps BUE support  Feet apart EC head turns x 5, head nods x 5 Mild dizziness initially, repeated 2 additional sets with less dizziness each repetition  No complaints of dizziness with standing exercises today Able to ambulate out of session with rollator, with head turns to participate in conversation, no dizziness reported   TREATMENT: 02/05/2024 Activity Comments  Reviewed HEP: Corner balance Romberg EO and EC and partial tandem stance Pt reports dizziness and imbalance during these exercises  Wall bumps to work on hip strategy Cues for technique, for eccentric control  Worked with  slightly wider BOS and with hip strategy wall bumps to try to improve balance in narrowed BOS  To try to utilize hip strategy as part of balance recovery-cues for eccentric control  Side step and weightshift 2 x 10 Decreased eccentric control and slides foot back to center-PT provides multimodal cues for technique and for use of UE support  Back step and weightshift 10 reps Cues for BUE support and for technique/control and pt c/o R knee pain  Seated hamstring stretch 3 x 15 sec  Good stretch, no c/o pain  Seated head motions 3 x 5 reps  Mild dizziness, saccades noted-dizziness decreases  Seated head nods 5 reps No dizziness   HOME EXERCISE PROGRAM: Access Code: [email or web address] URL: [email or web address] Date: 02/05/2024 Prepared by: [facility] Outpatient  Rehab - [facility]  Exercises - Corner Balance Feet Together With Eyes Open  - 1 x daily - 3 reps - 30 sec hold - Corner Balance Feet Together With Eyes Closed  - 1 x  daily - 7 x weekly - 3 reps - 30 sec hold - Corner Balance Feet Together: Eyes Open With Head Turns  - 1 x daily - 7 x weekly - 3 reps - 15-30 sec hold - Semi-Tandem Corner Balance With Eyes Open  - 1 x daily - 7 x weekly - 3 reps - 15 sec hold - Seated Left/Right Head Turns Vestibular Habituation  - 1-2 x daily - 7 x weekly - 3 sets - 5 reps    PATIENT EDUCATION: Education details: Benefits of consistent HEP performance with cues for safety-to use consistent UE support Person educated: Patient  Education method: Explanation Education comprehension: verbalized understanding   ----------------------------------------------- Note: Objective measures were completed at Evaluation unless otherwise noted.  DIAGNOSTIC FINDINGS: 01/08/2024:  X-ray images right knee obtained today personally and independently interpreted. Mild medial DJD.  No acute fractures are visible. MRI 10/2023:  IMPRESSION: 1. Small acute to early subacute right MCA infarcts. 2. Moderate  chronic small vessel ischemic disease. 3. Numerous chronic cerebral microhemorrhages and mild superficial siderosis, possibly reflecting cerebral amyloid angiopathy. 4. 13 mm extra-axial mass in the posterior fossa most consistent with a meningioma. No significant mass effect or brain edema. 5. No major intracranial arterial occlusion or significant proximal intracranial stenosis. 6. 35% stenosis of the proximal right ICA. 7. 65% stenosis of the proximal left subclavian artery. 8. Limited assessment of the proximal V1 segment of the left vertebral artery. A severe stenosis or short segment occlusion is not excluded.  COGNITION: Overall cognitive status: some word finding difficulty, pt reports sometimes getting confused with head motions   SENSATION: Light touch: Impaired    POSTURE:  rounded shoulders and forward head  Cervical ROM:    Active A/PROM (deg) eval  Flexion 20 with pain  Extension 18 with pain  Right lateral flexion   Left lateral flexion   Right rotation 20 pain  Left rotation 25 pain  (Blank rows = not tested)  MMT:  R hip flexion, knee flexion and knee extension 3+/5 L 4/5  R ankle dorsiflexion 3-/5 RLE, 3/5 LLE  BED MOBILITY:  NT  TRANSFERS: Assistive device utilized: use of BUEs  Sit to stand: SBA Stand to sit: SBA Unsteady upon standing GAIT: Gait pattern: has AFO, but not wearing today, step through pattern, decreased step length- Right, decreased stance time- Right, decreased ankle dorsiflexion- Right, Right steppage, wide BOS, and poor foot clearance- Right Distance walked: 50 ft Assistive device utilized: 3-wheeled RW Level of assistance: SBA Comments: not wearing AFO today  FUNCTIONAL TESTS:  10 meter walk test: 20.78 sec = 1.58 ft/sec  PATIENT SURVEYS:  DHI: THE DIZZINESS HANDICAP INVENTORY (DHI)  P1. Does looking up increase your problem? 2 = Sometimes  E2. Because of your problem, do you feel frustrated? 2 = Sometimes  F3.  Because of your problem, do you restrict your travel for business or recreation?  4 = Yes  P4. Does walking down the aisle of a supermarket increase your problems?  4 = Yes  F5. Because of your problem, do you have difficulty getting into or out of bed?  0 = No  F6. Does your problem significantly restrict your participation in social activities, such as going out to dinner, going to the movies, dancing, or going to parties? 4 = Yes  F7. Because of your problem, do you have difficulty reading?  4 = Yes  P8. Does performing more ambitious activities such as sports, dancing, household chores (sweeping or putting  dishes away) increase your problems?  0 = No  E9. Because of your problem, are you afraid to leave your home without having without having someone accompany you?  0 = No  E10. Because of your problem have you been embarrassed in front of others?  2 = Sometimes  P11. Do quick movements of your head increase your problem?  4 = Yes  F12. Because of your problem, do you avoid heights?  4 = Yes  P13. Does turning over in bed increase your problem?  0 = No  F14. Because of your problem, is it difficult for you to do strenuous homework or yard work? 4 = Yes  E15. Because of your problem, are you afraid people may think you are intoxicated? 0 = No  F16. Because of your problem, is it difficult for you to go for a walk by yourself?  0 = No  P17. Does walking down a sidewalk increase your problem?  0 = No  E18.Because of your problem, is it difficult for you to concentrate 0 = No  F19. Because of your problem, is it difficult for you to walk around your house in the dark? 4 = Yes  E20. Because of your problem, are you afraid to stay home alone?  0 = No  E21. Because of your problem, do you feel handicapped? 0 = No  E22. Has the problem placed stress on your relationships with members of your family or friends? 0 = No  E23. Because of your problem, are you depressed?  0 = No  F24. Does your problem  interfere with your job or household responsibilities?  4 = Yes  P25. Does bending over increase your problem?  4 = Yes  TOTAL 46    DHI Scoring Instructions  The patient is asked to answer each question as it pertains to dizziness or unsteadiness problems, specifically  considering their condition during the last month. Questions are designed to incorporate functional (F), physical  (P), and emotional (E) impacts on disability.   Scores greater than 10 points should be referred to balance specialists for further evaluation.   16-34 Points (mild handicap)  36-52 Points (moderate handicap)  54+ Points (severe handicap)  Minimally Detectable Change: 17 points ([address] [place], 1990)  [place], G. SHAUNNA. and [place], C. W. (1990). The development of the Dizziness Handicap Inventory. Archives of Otolaryngology - Head and Neck Surgery 116(4): [id number].   VESTIBULAR ASSESSMENT:  GENERAL OBSERVATION: No acute distress   SYMPTOM BEHAVIOR:  Subjective history: Has had multiple falls in the past 6 months, has hit his head per report.  Had cataract surgery about 1-2 years ago.  Has glassed for reading (not used in testing)  Non-Vestibular symptoms: neck pain and tinnitus (pt reports these are not new)  Type of dizziness: Imbalance (Disequilibrium), Spinning/Vertigo, and Unsteady with head/body turns  Frequency: a couple of times of day  Duration: 5 minutes  Aggravating factors: Induced by motion: looking up at the ceiling and turning head quickly  Relieving factors: head stationary and slow movements  Progression of symptoms: unchanged  OCULOMOTOR EXAM:  Ocular Alignment: normal  Ocular ROM: No Limitations  Spontaneous Nystagmus: absent  Gaze-Induced Nystagmus: absent  Smooth Pursuits: intact and brings on dizziness more horizontal than vertical  Saccades: some overshooting laterally to left, slowed to come back to midline; no symptoms  Convergence/Divergence: 2 cm   Cover-cross-cover  test: NT   VESTIBULAR - OCULAR REFLEX:   Slow VOR: Positive Bilaterally  and Comment: hard to coordinate, increases dizziness; negative vertically  VOR Cancellation: Comment: brings on dizziness and confusion  Head-Impulse Test: HIT Right: difficulty  HIT Left: difficulty  Unable to assess HIT due to pt's guarding in neck  Dynamic Visual Acuity: NT   POSITIONAL TESTING: Other: NT due to time constraints in eval and pt not reporting positional changes bringing on spinning sensations  MOTION SENSITIVITY:  NOT TESTED AT EVAL  Motion Sensitivity Quotient Intensity: 0 = none, 1 = Lightheaded, 2 = Mild, 3 = Moderate, 4 = Severe, 5 = Vomiting  Intensity  1. Sitting to supine   2. Supine to L side   3. Supine to R side   4. Supine to sitting   5. L Hallpike-Dix   6. Up from L    7. R Hallpike-Dix   8. Up from R    9. Sitting, head tipped to L knee   10. Head up from L knee   11. Sitting, head tipped to R knee   12. Head up from R knee   13. Sitting head turns x5   14.Sitting head nods x5   15. In stance, 180 turn to L    16. In stance, 180 turn to R     OTHOSTATICS: not done  FUNCTIONAL GAIT: MCTSIB Attempted  M-CTSIB  Condition 1: Firm Surface, EO 10 Sec, Severe Sway  Condition 2: Firm Surface, EC 0 Sec, Severe Sway  Condition 3: Foam Surface, EO unable Sec, NT Sway  Condition 4: Foam Surface, EC unable Sec, NT Sway                                                                                                                              TREATMENT DATE: 01/17/2024    GOALS: Goals reviewed with patient? Yes  SHORT TERM GOALS: Target date: 02/15/2024  Pt will be supervision with HEP for improved dizziness, balance. Baseline:supervision and cues Goal status: MET 02/12/2024   LONG TERM GOALS: Target date: 02/29/2024  Pt will be supervision with HEP for improved dizziness, balance. Baseline:  Goal status: IN PROGRESS  2.  Pt will improve gait velocity to at least  2 ft/sec for improved gait efficiency and safety. Baseline: 1.58 ft/sec Goal status: IN PROGRESS  3.  DHI score to improve to less than or equal to 28 to demo decreased dizziness impacting ADLs. Baseline:  Goal status: IN PROGRESS  4.  Pt will improve Berg score to at least 45/56 to decrease fall risk. Baseline: 32/56 Goal status: IN PROGRESS  5.  Pt will verbalize understanding of fall prevention in home environment. Baseline:  Goal status: IN PROGRESS  ASSESSMENT:  CLINICAL IMPRESSION: Pt presents today ***. Skilled PT session focused on ***. Pt needs ***. Pt will continue to benefit from skilled PT towards goals for improved functional mobility and decreased fall risk.   Reports still some discomfort to right knee. Initiated session with LE there ex  to improve ROM, strength, and reduce pain to good effect with report 0/10 pain after initial activities.  Proceeded to static multisensory balance activities to improve safety with ADL requiring min-mod A for control/correction to eyes closed demands w/ activities but markedly improved as he can maintain for 30 sec moderate sway.  Pt reports he should be obtaining new AFO in recent future. Pt denies any dizziness during session noting main issue being imbalance. Continued sessions to advance POC details to improve mobility and reduce risk for falls.   OBJECTIVE IMPAIRMENTS: Abnormal gait, decreased balance, decreased mobility, difficulty walking, decreased strength, and dizziness.   ACTIVITY LIMITATIONS: standing, transfers, and locomotion level  PARTICIPATION LIMITATIONS: meal prep, cleaning, laundry, and community activity  PERSONAL FACTORS: 3+ comorbidities: see above are also affecting patient's functional outcome.   REHAB POTENTIAL: Good  CLINICAL DECISION MAKING: Stable/uncomplicated  EVALUATION COMPLEXITY: Low   PLAN:  PT FREQUENCY: 1-2x/week  PT DURATION: 6 weeks plus eval  PLANNED INTERVENTIONS: 97750- Physical  Performance Testing, 97110-Therapeutic exercises, 97530- Therapeutic activity, 97112- Neuromuscular re-education, 97535- Self Care, 02859- Manual therapy, [phone]- Gait training, [phone]- Canalith repositioning, Patient/Family education, Balance training, and Vestibular training  PLAN FOR NEXT SESSION: ***Reinforce safety awareness and fall prevention, review and progress HEP-neck flexibility, habituation and VOR exercises, balance   Gabriela Giannelli W., PT 02/18/2024, 12:06 PM   [facility] [address] [place], [place] [address] Phone # [phone] Fax # [phone]

## 2024-02-19 ENCOUNTER — Ambulatory Visit (HOSPITAL_BASED_OUTPATIENT_CLINIC_OR_DEPARTMENT_OTHER): Admitting: Cardiology

## 2024-02-19 NOTE — Therapy (Signed)
 OUTPATIENT PHYSICAL THERAPY VESTIBULAR TREATMENT     Patient Name: Ronald Stafford MRN: [id number] DOB:1931-02-05, 89 y.o., male Today's Date: 02/19/2024  END OF SESSION:      Past Medical History:  Diagnosis Date   Cancer [facility]) 2004   prostate cancer, Dr. Alline   Colon polyps    Deep venous thrombosis (HCC) 1997   Postop   Depression    GERD (gastroesophageal reflux disease)    Hyperlipidemia    Hypertension    Radial head fracture, closed 09/18/2013   Shoulder fracture, left 02/23/2011   immobilized in sling , Dr Norris(no surgery)   Past Surgical History:  Procedure Laterality Date   COLONOSCOPY  2006   Negative,Dr.Edwards   colonoscopy with polypectomy      PMH of   ESOPHAGOGASTRODUODENOSCOPY N/A 02/23/2017   Procedure: ESOPHAGOGASTRODUODENOSCOPY (EGD);  Surgeon: Elicia Claw, MD;  Location: [facility];  Service: Gastroenterology;  Laterality: N/A;   JOINT REPLACEMENT  1997, 2001   Total Hip replacement X 2   LUMBAR SPINE SURGERY  09/28/2010   Dr Burnetta; for spinal stenosis   MECKEL DIVERTICULUM EXCISION     Age 11   PROSTATECTOMY  2004   w/ radiation, Dr Alline   TOTAL HIP REVISION Left 02/14/2017   Procedure: Left hip polyethylene EXCHANGE;  Surgeon: Melodi Lerner, MD;  Location: [facility];  Service: Orthopedics;  Laterality: Left;   VASECTOMY     Patient Active Problem List   Diagnosis Date Noted   Left foot drop 01/24/2024   Right foot drop 01/01/2024   Acute pain of right knee 01/01/2024   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/25/2023   Bilateral amaurosis fugax 09/25/2023   Lumbar radiculopathy, right 08/10/2023   Right lower quadrant pain 07/20/2023   Difficulty urinating 05/14/2023   Right buttock pain 05/30/2022   Frequent falls 05/09/2022   PAD (peripheral artery disease) 04/07/2022   Chest pain 04/06/2022   Carpal tunnel syndrome, right upper limb 05/24/2021   Cubital tunnel syndrome on right 05/24/2021   Renal cyst, left 06/15/2020   Gross hematuria  06/15/2020   Cervical radiculopathy 06/01/2020   Memory difficulty 06/01/2020   Vertigo 01/03/2019   Urinary incontinence 10/31/2018   Right hand paresthesia 08/10/2017   Skin abnormalities 06/13/2017   History of total hip arthroplasty 03/28/2017   Failed total hip arthroplasty 02/14/2017   Osteoarthritis of left hip 12/10/2016   Left shoulder pain 10/04/2016   History of DVT (deep vein thrombosis) 07/30/2016   History of TIA (transient ischemic attack) 07/30/2016   Bicytopenia 07/30/2016   Carotid arterial disease 07/20/2016   Right sided weakness 06/27/2016   Frequent headaches 06/22/2015   Prediabetes 02/11/2015   Peroneal tendonitis of right lower extremity 01/12/2014   Mass of right ankle 12/31/2013   Lung nodule, solitary 08/08/2012   SPINAL STENOSIS, LUMBAR 04/06/2010   FOOT DROP, RIGHT 05/21/2008   Hypercholesterolemia 12/25/2006   Essential hypertension 12/25/2006   PROSTATE CANCER, HX OF 12/25/2006   POLYP, COLON 05/02/2006    PCP: Geofm Glade PARAS, MD  REFERRING PROVIDER: Geofm Glade PARAS, MD *Also has order from Dr. Artist Lloyd  REFERRING DIAG: R42 (ICD-10-CM) - Vertigo  M25.561,G89.29 (ICD-10-CM) - Chronic pain of right knee (Dr. Lloyd) THERAPY DIAG:  No diagnosis found.  ONSET DATE: 01/01/2024  Rationale for Evaluation and Treatment: Rehabilitation  SUBJECTIVE:   SUBJECTIVE STATEMENT: ***The right knee is still a little troublesome.  Dizziness hasn't been as bad lately.  Pt accompanied by: caregiver  PERTINENT HISTORY: R foot drop  wears AFO, chronic dizziness; hx of falls, PAD, hx of TIA  PAIN:  Are you having pain? No pain today.  PRECAUTIONS: Fall  RED FLAGS: None   WEIGHT BEARING RESTRICTIONS: No  FALLS: Has patient fallen in last 6 months? Yes. Number of falls a bunch  LIVING ENVIRONMENT: Lives with: lives alone and has caregiver come in twice a week Lives in: House/apartment Stairs: ramp to enter home Has following equipment at home:  Vannie - 2 wheeled, Environmental Consultant - 4 wheeled, and 3-wheeled RW  PLOF: Independent with household mobility with device and Independent with community mobility with device  PATIENT GOALS: To help with the dizziness  OBJECTIVE:    TODAY'S TREATMENT: 02/20/2024 Activity Comments                      TODAY'S TREATMENT: 02/15/24 Activity Comments  NU-step level 4 x 6 min For dynamic warm-up  LE PRE 3x10, 4# ankle weights    Static multisensory balance Mod A for eyes closed conditions              TODAY'S TREATMENT: 02/12/2024 Activity Comments  Reviewed seated head turns added to HEP last visit Good return demo, no dizziness  Review of HEP: Corner balance EO + head turns, Romberg, then EC Partial heel/toe EO   Performs at counter-cues for safety with intermittent UE support-tends to lose balance and reaches for support with UE support.  PT provides cues to use consistent UE support for stability  -Forward/back walking at counter, 3 reps -Sidestepping 3 reps -Marching in place 2 x 5 reps -Marching forward along counter, 3 reps Cues for optimal foot clearance, step length for balance Reports fatigue  Forward step over obstacles, 2 x 5 reps Sidestep over obstacles 10 reps Back step and weightshift 2 x 5 reps-fatigues midway through set Cues to look ahead   Forward/back step and weightshift 10 reps BUE support  Feet apart EC head turns x 5, head nods x 5 Mild dizziness initially, repeated 2 additional sets with less dizziness each repetition  No complaints of dizziness with standing exercises today Able to ambulate out of session with rollator, with head turns to participate in conversation, no dizziness reported   TREATMENT: 02/05/2024 Activity Comments  Reviewed HEP: Corner balance Romberg EO and EC and partial tandem stance Pt reports dizziness and imbalance during these exercises  Wall bumps to work on hip strategy Cues for technique, for eccentric control  Worked with  slightly wider BOS and with hip strategy wall bumps to try to improve balance in narrowed BOS  To try to utilize hip strategy as part of balance recovery-cues for eccentric control  Side step and weightshift 2 x 10 Decreased eccentric control and slides foot back to center-PT provides multimodal cues for technique and for use of UE support  Back step and weightshift 10 reps Cues for BUE support and for technique/control and pt c/o R knee pain  Seated hamstring stretch 3 x 15 sec  Good stretch, no c/o pain  Seated head motions 3 x 5 reps  Mild dizziness, saccades noted-dizziness decreases  Seated head nods 5 reps No dizziness   HOME EXERCISE PROGRAM: Access Code: [email or web address] URL: [email or web address] Date: 02/05/2024 Prepared by: [facility] - Outpatient  Rehab - [facility]  Exercises - Corner Balance Feet Together With Eyes Open  - 1 x daily - 3 reps - 30 sec hold - Corner Balance Feet Together With Eyes Closed  - 1 x  daily - 7 x weekly - 3 reps - 30 sec hold - Corner Balance Feet Together: Eyes Open With Head Turns  - 1 x daily - 7 x weekly - 3 reps - 15-30 sec hold - Semi-Tandem Corner Balance With Eyes Open  - 1 x daily - 7 x weekly - 3 reps - 15 sec hold - Seated Left/Right Head Turns Vestibular Habituation  - 1-2 x daily - 7 x weekly - 3 sets - 5 reps    PATIENT EDUCATION: Education details: Benefits of consistent HEP performance with cues for safety-to use consistent UE support Person educated: Patient  Education method: Explanation Education comprehension: verbalized understanding   ----------------------------------------------- Note: Objective measures were completed at Evaluation unless otherwise noted.  DIAGNOSTIC FINDINGS: 01/08/2024:  X-ray images right knee obtained today personally and independently interpreted. Mild medial DJD.  No acute fractures are visible. MRI 10/2023:  IMPRESSION: 1. Small acute to early subacute right MCA infarcts. 2. Moderate  chronic small vessel ischemic disease. 3. Numerous chronic cerebral microhemorrhages and mild superficial siderosis, possibly reflecting cerebral amyloid angiopathy. 4. 13 mm extra-axial mass in the posterior fossa most consistent with a meningioma. No significant mass effect or brain edema. 5. No major intracranial arterial occlusion or significant proximal intracranial stenosis. 6. 35% stenosis of the proximal right ICA. 7. 65% stenosis of the proximal left subclavian artery. 8. Limited assessment of the proximal V1 segment of the left vertebral artery. A severe stenosis or short segment occlusion is not excluded.  COGNITION: Overall cognitive status: some word finding difficulty, pt reports sometimes getting confused with head motions   SENSATION: Light touch: Impaired    POSTURE:  rounded shoulders and forward head  Cervical ROM:    Active A/PROM (deg) eval  Flexion 20 with pain  Extension 18 with pain  Right lateral flexion   Left lateral flexion   Right rotation 20 pain  Left rotation 25 pain  (Blank rows = not tested)  MMT:  R hip flexion, knee flexion and knee extension 3+/5 L 4/5  R ankle dorsiflexion 3-/5 RLE, 3/5 LLE  BED MOBILITY:  NT  TRANSFERS: Assistive device utilized: use of BUEs  Sit to stand: SBA Stand to sit: SBA Unsteady upon standing GAIT: Gait pattern: has AFO, but not wearing today, step through pattern, decreased step length- Right, decreased stance time- Right, decreased ankle dorsiflexion- Right, Right steppage, wide BOS, and poor foot clearance- Right Distance walked: 50 ft Assistive device utilized: 3-wheeled RW Level of assistance: SBA Comments: not wearing AFO today  FUNCTIONAL TESTS:  10 meter walk test: 20.78 sec = 1.58 ft/sec  PATIENT SURVEYS:  DHI: THE DIZZINESS HANDICAP INVENTORY (DHI)  P1. Does looking up increase your problem? 2 = Sometimes  E2. Because of your problem, do you feel frustrated? 2 = Sometimes  F3.  Because of your problem, do you restrict your travel for business or recreation?  4 = Yes  P4. Does walking down the aisle of a supermarket increase your problems?  4 = Yes  F5. Because of your problem, do you have difficulty getting into or out of bed?  0 = No  F6. Does your problem significantly restrict your participation in social activities, such as going out to dinner, going to the movies, dancing, or going to parties? 4 = Yes  F7. Because of your problem, do you have difficulty reading?  4 = Yes  P8. Does performing more ambitious activities such as sports, dancing, household chores (sweeping or putting  dishes away) increase your problems?  0 = No  E9. Because of your problem, are you afraid to leave your home without having without having someone accompany you?  0 = No  E10. Because of your problem have you been embarrassed in front of others?  2 = Sometimes  P11. Do quick movements of your head increase your problem?  4 = Yes  F12. Because of your problem, do you avoid heights?  4 = Yes  P13. Does turning over in bed increase your problem?  0 = No  F14. Because of your problem, is it difficult for you to do strenuous homework or yard work? 4 = Yes  E15. Because of your problem, are you afraid people may think you are intoxicated? 0 = No  F16. Because of your problem, is it difficult for you to go for a walk by yourself?  0 = No  P17. Does walking down a sidewalk increase your problem?  0 = No  E18.Because of your problem, is it difficult for you to concentrate 0 = No  F19. Because of your problem, is it difficult for you to walk around your house in the dark? 4 = Yes  E20. Because of your problem, are you afraid to stay home alone?  0 = No  E21. Because of your problem, do you feel handicapped? 0 = No  E22. Has the problem placed stress on your relationships with members of your family or friends? 0 = No  E23. Because of your problem, are you depressed?  0 = No  F24. Does your problem  interfere with your job or household responsibilities?  4 = Yes  P25. Does bending over increase your problem?  4 = Yes  TOTAL 46    DHI Scoring Instructions  The patient is asked to answer each question as it pertains to dizziness or unsteadiness problems, specifically  considering their condition during the last month. Questions are designed to incorporate functional (F), physical  (P), and emotional (E) impacts on disability.   Scores greater than 10 points should be referred to balance specialists for further evaluation.   16-34 Points (mild handicap)  36-52 Points (moderate handicap)  54+ Points (severe handicap)  Minimally Detectable Change: 17 points ([address] [place], 1990)  [place], G. SHAUNNA. and [place]ullom, C. W. (1990). The development of the Dizziness Handicap Inventory. Archives of Otolaryngology - Head and Neck Surgery 116(4): [id number].   VESTIBULAR ASSESSMENT:  GENERAL OBSERVATION: No acute distress   SYMPTOM BEHAVIOR:  Subjective history: Has had multiple falls in the past 6 months, has hit his head per report.  Had cataract surgery about 1-2 years ago.  Has glassed for reading (not used in testing)  Non-Vestibular symptoms: neck pain and tinnitus (pt reports these are not new)  Type of dizziness: Imbalance (Disequilibrium), Spinning/Vertigo, and Unsteady with head/body turns  Frequency: a couple of times of day  Duration: 5 minutes  Aggravating factors: Induced by motion: looking up at the ceiling and turning head quickly  Relieving factors: head stationary and slow movements  Progression of symptoms: unchanged  OCULOMOTOR EXAM:  Ocular Alignment: normal  Ocular ROM: No Limitations  Spontaneous Nystagmus: absent  Gaze-Induced Nystagmus: absent  Smooth Pursuits: intact and brings on dizziness more horizontal than vertical  Saccades: some overshooting laterally to left, slowed to come back to midline; no symptoms  Convergence/Divergence: 2 cm   Cover-cross-cover  test: NT   VESTIBULAR - OCULAR REFLEX:   Slow VOR: Positive Bilaterally  and Comment: hard to coordinate, increases dizziness; negative vertically  VOR Cancellation: Comment: brings on dizziness and confusion  Head-Impulse Test: HIT Right: difficulty  HIT Left: difficulty  Unable to assess HIT due to pt's guarding in neck  Dynamic Visual Acuity: NT   POSITIONAL TESTING: Other: NT due to time constraints in eval and pt not reporting positional changes bringing on spinning sensations  MOTION SENSITIVITY:  NOT TESTED AT EVAL  Motion Sensitivity Quotient Intensity: 0 = none, 1 = Lightheaded, 2 = Mild, 3 = Moderate, 4 = Severe, 5 = Vomiting  Intensity  1. Sitting to supine   2. Supine to L side   3. Supine to R side   4. Supine to sitting   5. L Hallpike-Dix   6. Up from L    7. R Hallpike-Dix   8. Up from R    9. Sitting, head tipped to L knee   10. Head up from L knee   11. Sitting, head tipped to R knee   12. Head up from R knee   13. Sitting head turns x5   14.Sitting head nods x5   15. In stance, 180 turn to L    16. In stance, 180 turn to R     OTHOSTATICS: not done  FUNCTIONAL GAIT: MCTSIB Attempted  M-CTSIB  Condition 1: Firm Surface, EO 10 Sec, Severe Sway  Condition 2: Firm Surface, EC 0 Sec, Severe Sway  Condition 3: Foam Surface, EO unable Sec, NT Sway  Condition 4: Foam Surface, EC unable Sec, NT Sway                                                                                                                              TREATMENT DATE: 01/17/2024    GOALS: Goals reviewed with patient? Yes  SHORT TERM GOALS: Target date: 02/15/2024  Pt will be supervision with HEP for improved dizziness, balance. Baseline:supervision and cues Goal status: MET 02/12/2024   LONG TERM GOALS: Target date: 02/29/2024  Pt will be supervision with HEP for improved dizziness, balance. Baseline:  Goal status: IN PROGRESS  2.  Pt will improve gait velocity to at least  2 ft/sec for improved gait efficiency and safety. Baseline: 1.58 ft/sec Goal status: IN PROGRESS  3.  DHI score to improve to less than or equal to 28 to demo decreased dizziness impacting ADLs. Baseline:  Goal status: IN PROGRESS  4.  Pt will improve Berg score to at least 45/56 to decrease fall risk. Baseline: 32/56 Goal status: IN PROGRESS  5.  Pt will verbalize understanding of fall prevention in home environment. Baseline:  Goal status: IN PROGRESS  ASSESSMENT:  CLINICAL IMPRESSION: Pt presents today ***. Skilled PT session focused on ***. Pt needs ***. Pt will continue to benefit from skilled PT towards goals for improved functional mobility and decreased fall risk.   Reports still some discomfort to right knee. Initiated session with LE there ex  to improve ROM, strength, and reduce pain to good effect with report 0/10 pain after initial activities.  Proceeded to static multisensory balance activities to improve safety with ADL requiring min-mod A for control/correction to eyes closed demands w/ activities but markedly improved as he can maintain for 30 sec moderate sway.  Pt reports he should be obtaining new AFO in recent future. Pt denies any dizziness during session noting main issue being imbalance. Continued sessions to advance POC details to improve mobility and reduce risk for falls.   OBJECTIVE IMPAIRMENTS: Abnormal gait, decreased balance, decreased mobility, difficulty walking, decreased strength, and dizziness.   ACTIVITY LIMITATIONS: standing, transfers, and locomotion level  PARTICIPATION LIMITATIONS: meal prep, cleaning, laundry, and community activity  PERSONAL FACTORS: 3+ comorbidities: see above are also affecting patient's functional outcome.   REHAB POTENTIAL: Good  CLINICAL DECISION MAKING: Stable/uncomplicated  EVALUATION COMPLEXITY: Low   PLAN:  PT FREQUENCY: 1-2x/week  PT DURATION: 6 weeks plus eval  PLANNED INTERVENTIONS: 97750- Physical  Performance Testing, 97110-Therapeutic exercises, 97530- Therapeutic activity, 97112- Neuromuscular re-education, 97535- Self Care, 02859- Manual therapy, [phone]- Gait training, [phone]- Canalith repositioning, Patient/Family education, Balance training, and Vestibular training  PLAN FOR NEXT SESSION: Reinforce safety awareness and fall prevention, review and progress HEP-neck flexibility, habituation and VOR exercises, balance   Slater MARLA Christians, PT 02/19/2024, 9:48 AM   [facility] at [facility] [address], [address] [place], [place] [address] Phone # [phone] Fax # [phone]

## 2024-02-20 ENCOUNTER — Encounter: Payer: Self-pay | Admitting: Physical Therapy

## 2024-02-20 ENCOUNTER — Ambulatory Visit: Admitting: Physical Therapy

## 2024-02-20 ENCOUNTER — Other Ambulatory Visit (HOSPITAL_COMMUNITY): Payer: Self-pay

## 2024-02-20 DIAGNOSIS — R2681 Unsteadiness on feet: Secondary | ICD-10-CM

## 2024-02-20 DIAGNOSIS — R2689 Other abnormalities of gait and mobility: Secondary | ICD-10-CM

## 2024-02-20 DIAGNOSIS — R42 Dizziness and giddiness: Secondary | ICD-10-CM | POA: Diagnosis not present

## 2024-02-21 ENCOUNTER — Other Ambulatory Visit (HOSPITAL_COMMUNITY): Payer: Self-pay

## 2024-02-21 ENCOUNTER — Ambulatory Visit (HOSPITAL_BASED_OUTPATIENT_CLINIC_OR_DEPARTMENT_OTHER): Payer: Self-pay | Admitting: Physical Therapy

## 2024-02-21 DIAGNOSIS — G8929 Other chronic pain: Secondary | ICD-10-CM

## 2024-02-21 DIAGNOSIS — M6281 Muscle weakness (generalized): Secondary | ICD-10-CM | POA: Diagnosis present

## 2024-02-21 DIAGNOSIS — M25561 Pain in right knee: Secondary | ICD-10-CM | POA: Insufficient documentation

## 2024-02-21 DIAGNOSIS — R2689 Other abnormalities of gait and mobility: Secondary | ICD-10-CM

## 2024-02-21 NOTE — Therapy (Incomplete)
 OUTPATIENT PHYSICAL THERAPY LOWER EXTREMITY Re-Eval   Patient Name: Ronald Stafford MRN: [id number] DOB:07/20/1930, 89 y.o., male Today's Date: 02/22/2024  END OF SESSION:  PT End of Session - 02/21/24 1324     Visit Number 7   1 for knee   Number of Visits 12    Date for Recertification  04/04/24    Authorization Type [employer] Medicare/Medicaid    PT Start Time 1323    PT Stop Time 1410    PT Time Calculation (min) 47 min    Activity Tolerance Patient tolerated treatment well    Behavior During Therapy [facility] for tasks assessed/performed          Past Medical History:  Diagnosis Date   Cancer (HCC) 2004   prostate cancer, Dr. Alline   Colon polyps    Deep venous thrombosis (HCC) 1997   Postop   Depression    GERD (gastroesophageal reflux disease)    Hyperlipidemia    Hypertension    Radial head fracture, closed 09/18/2013   Shoulder fracture, left 02/23/2011   immobilized in sling , Dr Norris(no surgery)   Past Surgical History:  Procedure Laterality Date   COLONOSCOPY  2006   Negative,Dr.Edwards   colonoscopy with polypectomy      PMH of   ESOPHAGOGASTRODUODENOSCOPY N/A 02/23/2017   Procedure: ESOPHAGOGASTRODUODENOSCOPY (EGD);  Surgeon: Elicia Claw, MD;  Location: [facility];  Service: Gastroenterology;  Laterality: N/A;   JOINT REPLACEMENT  1997, 2001   Total Hip replacement X 2   LUMBAR SPINE SURGERY  09/28/2010   Dr Burnetta; for spinal stenosis   MECKEL DIVERTICULUM EXCISION     Age 15   PROSTATECTOMY  2004   w/ radiation, Dr Alline   TOTAL HIP REVISION Left 02/14/2017   Procedure: Left hip polyethylene EXCHANGE;  Surgeon: Melodi Lerner, MD;  Location: [facility];  Service: Orthopedics;  Laterality: Left;   VASECTOMY     Patient Active Problem List   Diagnosis Date Noted   Left foot drop 01/24/2024   Right foot drop 01/01/2024   Acute pain of right knee 01/01/2024   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/25/2023   Bilateral amaurosis fugax 09/25/2023   Lumbar  radiculopathy, right 08/10/2023   Right lower quadrant pain 07/20/2023   Difficulty urinating 05/14/2023   Right buttock pain 05/30/2022   Frequent falls 05/09/2022   PAD (peripheral artery disease) 04/07/2022   Chest pain 04/06/2022   Carpal tunnel syndrome, right upper limb 05/24/2021   Cubital tunnel syndrome on right 05/24/2021   Renal cyst, left 06/15/2020   Gross hematuria 06/15/2020   Cervical radiculopathy 06/01/2020   Memory difficulty 06/01/2020   Vertigo 01/03/2019   Urinary incontinence 10/31/2018   Right hand paresthesia 08/10/2017   Skin abnormalities 06/13/2017   History of total hip arthroplasty 03/28/2017   Failed total hip arthroplasty 02/14/2017   Osteoarthritis of left hip 12/10/2016   Left shoulder pain 10/04/2016   History of DVT (deep vein thrombosis) 07/30/2016   History of TIA (transient ischemic attack) 07/30/2016   Bicytopenia 07/30/2016   Carotid arterial disease 07/20/2016   Right sided weakness 06/27/2016   Frequent headaches 06/22/2015   Prediabetes 02/11/2015   Peroneal tendonitis of right lower extremity 01/12/2014   Mass of right ankle 12/31/2013   Lung nodule, solitary 08/08/2012   SPINAL STENOSIS, LUMBAR 04/06/2010   FOOT DROP, RIGHT 05/21/2008   Hypercholesterolemia 12/25/2006   Essential hypertension 12/25/2006   PROSTATE CANCER, HX OF 12/25/2006   POLYP, COLON 05/02/2006  REFERRING PROVIDER: Joane Artist RAMAN, MD   REFERRING DIAG: [phone] (ICD-10-CM) - Chronic pain of right knee   THERAPY DIAG:  Chronic pain of right knee  Muscle weakness (generalized)  Other abnormalities of gait and mobility  Rationale for Evaluation and Treatment: Rehabilitation  ONSET DATE: PT order 01/08/24  SUBJECTIVE:   SUBJECTIVE STATEMENT: Pt is receiving vestibular rehab which he started on 01/17/24.  Pt states he basically is through with vestibular rehab and has about one more appt.    Pt has an aide to help him 2x/wk.  She does some  household cleaning, laundry, and takes him to appointments.  Pt's aide present during the re-evaluation.  Pt states he does his household chores and what he wants to do inside home.   Pt has increased knee pain with movement.  Pt reports having balance difficulty.  Pt ambulates with walkers.  Pt reports no difficulty with performing stairs because he uses the rail.  He states he has to sit down and rest sometimes because it hurts.  Pt states he is not limited with functional mobility.  Pt's aide states he is limited with standing.  Pt states he is limited with standing and leans on counter to prepare food.    Pt states the AFO brace doesn't fit properly, and he has an appt tomorrow to get assessed  Pt uses a stationary bike at home.    PERTINENT HISTORY: R foot drop wears AFO, chronic dizziness; hx of falls, PAD, hx of TIA, L shoulder fx in 2012 L THR (possibly x2)--pt states posterolateral prostate CA in 2004 Lumbar spinal stenosis  PAIN:  NPRS:  0/10 current, 3-4/10 worst pain Location:  R knee  PRECAUTIONS: Fall and Other: THR, AFO on R     WEIGHT BEARING RESTRICTIONS: No  FALLS:  Has patient fallen in last 6 months? Yes. Number of falls I fall every once in a while, I don't count them.  2 falls in the past 2 months including closing the closet door, tripped over his foot   LIVING ENVIRONMENT: Lives with: lives alone and has caregiver come in twice a week Lives in: House/apartment Stairs: yes, ramp to enter home Has following equipment at home: Vannie - 2 wheeled, Environmental Consultant - 4 wheeled, and 3-wheeled RW   PLOF:  Independent with household mobility with device and Independent with community mobility with device   PATIENT GOALS: to be able to get around better, to stand up from a chair better    OBJECTIVE:  Note: Objective measures were completed at Evaluation unless otherwise noted.  DIAGNOSTIC FINDINGS:  R knee x ray:  01/08/24 FINDINGS:   BONES AND JOINTS: No  acute fracture. No focal osseous lesion. No joint dislocation. No significant joint effusion. Mild tricompartmental degenerative changes.   SOFT TISSUES: Soft tissue swelling anterior to the proximal tibial tubercle. Vascular calcifications.   IMPRESSION: 1. Mild tricompartmental degenerative changes. 2. Soft tissue swelling anterior to the proximal tibial tubercle.   MRI 10/2023:  IMPRESSION: 1. Small acute to early subacute right MCA infarcts. 2. Moderate chronic small vessel ischemic disease. 3. Numerous chronic cerebral microhemorrhages and mild superficial siderosis, possibly reflecting cerebral amyloid angiopathy. 4. 13 mm extra-axial mass in the posterior fossa most consistent with a meningioma. No significant mass effect or brain edema. 5. No major intracranial arterial occlusion or significant proximal intracranial stenosis. 6. 35% stenosis of the proximal right ICA. 7. 65% stenosis of the proximal left subclavian artery. 8. Limited assessment of the proximal  V1 segment of the left vertebral artery. A severe stenosis or short segment occlusion is not excluded.  PATIENT SURVEYS:  LEFS:  40/80  COGNITION: Overall cognitive status: Within functional limits for tasks assessed     SENSATION: Pt wearing a brace on R knee.  Pt wearing an AFO on R   PALPATION: No tenderness with palpation to R knee except inferomedial knee.  LOWER EXTREMITY ROM:  Active ROM Right eval Left eval  Hip flexion    Hip extension    Hip abduction    Hip adduction    Hip internal rotation    Hip external rotation    Knee flexion 125   Knee extension 0   Ankle dorsiflexion    Ankle plantarflexion    Ankle inversion    Ankle eversion     (Blank rows = not tested)  LOWER EXTREMITY MMT:  MMT Right eval Left eval  Hip flexion 4+/5 4+/5  Hip extension    Hip abduction 23.4 19.5  Hip adduction    Hip internal rotation    Hip external rotation    Knee flexion 5/5 seated  5/5  seated  Knee extension 4+/5 4+/5  Ankle dorsiflexion    Ankle plantarflexion    Ankle inversion    Ankle eversion     (Blank rows = not tested)     FUNCTIONAL TESTS:  Pt requires UE support with performing sit to stands.  He has decreased control with lowering to the chair.  Pt required cues to slowly lower himself to the chair. 5x STS test:  29.29 sec with Ue's and walker in front of him.    GAIT: Gait pattern:  Pt wearing AFO and ambulating with 3 wheeled walker. Slow gait, decreased knee extension with gait, decreased toe off on R, step through gait                                                                                                                               TREATMENT:   Re-eval completed.  See above  PATIENT EDUCATION:  Education details: relevant anatomy, POC, objective findings, dx, and rationale of interventions.  Person educated: Patient and Caregiver   Education method: Explanation Education comprehension: verbalized understanding and needs further education  HOME EXERCISE PROGRAM: Pt has a current HEP for balance.  Will update HEP.   ASSESSMENT:  CLINICAL IMPRESSION: Patient is a 89 y.o. male who has been receiving PT for vertigo and falls.  He presents to PT for chronic R knee pain.  He has a hx of falls.  Pt has an aide to help him 2x/wk.  She does some household cleaning, laundry, and takes him to appointments.  Pt uses walkers to ambulate and reports having balance difficulty.  Pt is wearing his AFO on his R ankle today.  He states it doesn't fit correctly and has an appt to get it adjusted tomorrow.  Pt is limited with standing and  has increased knee pain with movement.  Pt has difficulty with sit to stands.  He has decreased control with lowering to chair and requires cuing for correct form.  He demonstrates much improved LE strength since the prior eval he had in November.  PT set new goals pertaining to his R LE and functional mobility.  Pt should  benefit from skilled PT to address impairments and improve overall function.   OBJECTIVE IMPAIRMENTS: Abnormal gait, decreased balance, decreased mobility, difficulty walking, decreased strength, and dizziness.    ACTIVITY LIMITATIONS: standing, transfers, and locomotion level   PARTICIPATION LIMITATIONS: meal prep, cleaning, laundry, and community activity   PERSONAL FACTORS: 3+ comorbidities: see above are also affecting patient's functional outcome.    REHAB POTENTIAL: Good   CLINICAL DECISION MAKING: Stable/uncomplicated   EVALUATION COMPLEXITY: Low   GOALS:   SHORT TERM GOALS: Target date:  03/13/2024  Pt will demo decreased speed and improved control when lowering to chair with standing to sit.  Baseline: Goal status: INITIAL  2.  Pt will report at least a 25% improvement in R knee pain overall.   Baseline:  Goal status: INITIAL Target date:  03/20/2024    LONG TERM GOALS: Target date: 04/04/2024  Pt will be independent and compliant with HEP for improved strength, pain, and functional mobility.   Baseline:  Goal status: INITIAL  2.  Pt will demo improved bilat knee extension strength to 5/5 MMT for improved strength and performance of sit to stands.  Baseline:  Goal status: INITIAL  3.  Pt will perform sit to/from stand transfers safely with the usage of his hands.   Baseline:  Goal status: INITIAL  4.  Pt will report improved tolerance and stability with standing activities. Baseline:  Goal status: INITIAL     PLAN:  PT FREQUENCY: 1x/week  PT DURATION: other: 5-6 weeks  PLANNED INTERVENTIONS: 97750- Physical Performance Testing, 97110-Therapeutic exercises, 97530- Therapeutic activity, [id number]- Neuromuscular re-education, 97535- Self Care, [id number]- Manual therapy, [phone]- Gait training, [phone]- Canalith repositioning, Patient/Family education, Balance training, and Vestibular training   PLAN FOR NEXT SESSION: Work on sit to stands.  Will update HEP.  Quad  strength   Leigh Minerva III PT, DPT 02/22/2024 5:16 PM     Date of referral: 01/08/2024 Referring provider: Joane Artist RAMAN, MD  Referring diagnosis? M25.561,G89.29 (ICD-10-CM) - Chronic pain of right knee  Treatment diagnosis? (if different than referring diagnosis)  Chronic pain of right knee  Muscle weakness (generalized)  Other abnormalities of gait and mobility  What was this (referring dx) caused by? Ongoing Issue  Lysle of Condition: Chronic (continuous duration > 3 months)   Laterality: Rt  Current Functional Measure Score: LEFS 40/80  Objective measurements identify impairments when they are compared to normal values, the uninvolved extremity, and prior level of function.  [x]  Yes  []  No  Objective assessment of functional ability: Minimal functional limitations- Moderate   Briefly describe symptoms: R knee pain.  Difficulty with sit to stands.  Hx of falls.   How did symptoms start:   Average pain intensity:  0/10 current, 3-4/10 worst pain  How often does the pt experience symptoms? Intermittently  How much have the symptoms interfered with usual daily activities? Moderately  How has condition changed since care began at this facility? Strength has improved.  In general, how is the patients overall health?    BACK PAIN (STarT Back Screening Tool) No

## 2024-02-22 ENCOUNTER — Other Ambulatory Visit: Payer: Self-pay

## 2024-02-22 ENCOUNTER — Encounter (HOSPITAL_BASED_OUTPATIENT_CLINIC_OR_DEPARTMENT_OTHER): Payer: Self-pay | Admitting: Physical Therapy

## 2024-02-27 ENCOUNTER — Ambulatory Visit (HOSPITAL_BASED_OUTPATIENT_CLINIC_OR_DEPARTMENT_OTHER): Payer: Self-pay | Admitting: Physical Therapy

## 2024-02-27 DIAGNOSIS — M25561 Pain in right knee: Secondary | ICD-10-CM | POA: Diagnosis not present

## 2024-02-27 DIAGNOSIS — R2689 Other abnormalities of gait and mobility: Secondary | ICD-10-CM

## 2024-02-27 DIAGNOSIS — G8929 Other chronic pain: Secondary | ICD-10-CM

## 2024-02-27 DIAGNOSIS — M6281 Muscle weakness (generalized): Secondary | ICD-10-CM

## 2024-02-27 NOTE — Therapy (Signed)
 OUTPATIENT PHYSICAL THERAPY LOWER EXTREMITY TREATMENT   Patient Name: Ronald Stafford MRN: [id number] DOB:[birth year], 89 y.o., male Today's Date: 02/28/2024  END OF SESSION:  PT End of Session - 02/27/24 1626     Visit Number 8   2 for knee   Number of Visits 12    Date for Recertification  04/04/24    Authorization Type [employer] Medicare/Medicaid    PT Start Time 1539    PT Stop Time 1620    PT Time Calculation (min) 41 min    Activity Tolerance Patient tolerated treatment well    Behavior During Therapy [facility] for tasks assessed/performed           Past Medical History:  Diagnosis Date   Cancer (HCC) 2004   prostate cancer, Dr. Alline   Colon polyps    Deep venous thrombosis (HCC) 1997   Postop   Depression    GERD (gastroesophageal reflux disease)    Hyperlipidemia    Hypertension    Radial head fracture, closed 09/18/2013   Shoulder fracture, left 02/23/2011   immobilized in sling , Dr Norris(no surgery)   Past Surgical History:  Procedure Laterality Date   COLONOSCOPY  2006   Negative,Dr.Edwards   colonoscopy with polypectomy      PMH of   ESOPHAGOGASTRODUODENOSCOPY N/A 02/23/2017   Procedure: ESOPHAGOGASTRODUODENOSCOPY (EGD);  Surgeon: Elicia Claw, MD;  Location: [facility];  Service: Gastroenterology;  Laterality: N/A;   JOINT REPLACEMENT  1997, 2001   Total Hip replacement X 2   LUMBAR SPINE SURGERY  09/28/2010   Dr Burnetta; for spinal stenosis   MECKEL DIVERTICULUM EXCISION     Age 77   PROSTATECTOMY  2004   w/ radiation, Dr Alline   TOTAL HIP REVISION Left 02/14/2017   Procedure: Left hip polyethylene EXCHANGE;  Surgeon: Melodi Lerner, MD;  Location: [facility];  Service: Orthopedics;  Laterality: Left;   VASECTOMY     Patient Active Problem List   Diagnosis Date Noted   Left foot drop 01/24/2024   Right foot drop 01/01/2024   Acute pain of right knee 01/01/2024   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/25/2023   Bilateral amaurosis fugax 09/25/2023   Lumbar  radiculopathy, right 08/10/2023   Right lower quadrant pain 07/20/2023   Difficulty urinating 05/14/2023   Right buttock pain 05/30/2022   Frequent falls 05/09/2022   PAD (peripheral artery disease) 04/07/2022   Chest pain 04/06/2022   Carpal tunnel syndrome, right upper limb 05/24/2021   Cubital tunnel syndrome on right 05/24/2021   Renal cyst, left 06/15/2020   Gross hematuria 06/15/2020   Cervical radiculopathy 06/01/2020   Memory difficulty 06/01/2020   Vertigo 01/03/2019   Urinary incontinence 10/31/2018   Right hand paresthesia 08/10/2017   Skin abnormalities 06/13/2017   History of total hip arthroplasty 03/28/2017   Failed total hip arthroplasty 02/14/2017   Osteoarthritis of left hip 12/10/2016   Left shoulder pain 10/04/2016   History of DVT (deep vein thrombosis) 07/30/2016   History of TIA (transient ischemic attack) 07/30/2016   Bicytopenia 07/30/2016   Carotid arterial disease 07/20/2016   Right sided weakness 06/27/2016   Frequent headaches 06/22/2015   Prediabetes 02/11/2015   Peroneal tendonitis of right lower extremity 01/12/2014   Mass of right ankle 12/31/2013   Lung nodule, solitary 08/08/2012   SPINAL STENOSIS, LUMBAR 04/06/2010   FOOT DROP, RIGHT 05/21/2008   Hypercholesterolemia 12/25/2006   Essential hypertension 12/25/2006   PROSTATE CANCER, HX OF 12/25/2006   POLYP, COLON 05/02/2006  REFERRING PROVIDER: Joane Artist RAMAN, MD   REFERRING DIAG: [phone] (ICD-10-CM) - Chronic pain of right knee   THERAPY DIAG:  Chronic pain of right knee  Muscle weakness (generalized)  Other abnormalities of gait and mobility  Rationale for Evaluation and Treatment: Rehabilitation  ONSET DATE: PT order 01/08/24  SUBJECTIVE:   SUBJECTIVE STATEMENT: Pt uses his stationary bike for 5 mins this AM.  Pt states he felt ok after prior treatment.  Pt states he has been doing some of his HEP.  Pt states he needs to work on balance, he loses his balance  bwds.      is receiving vestibular rehab which he started on 01/17/24.  Pt states he basically is through with vestibular rehab and has about one more appt.    Pt has an aide to help him 2x/wk.  She does some household cleaning, laundry, and takes him to appointments.  Pt's aide present during the re-evaluation.  Pt states he does his household chores and what he wants to do inside home.   Pt has increased knee pain with movement.  Pt reports having balance difficulty.  Pt ambulates with walkers.  Pt reports no difficulty with performing stairs because he uses the rail.  He states he has to sit down and rest sometimes because it hurts.  Pt states he is not limited with functional mobility.  Pt's aide states he is limited with standing.  Pt states he is limited with standing and leans on counter to prepare food.    Pt states the AFO brace doesn't fit properly, and he has an appt tomorrow to get assessed  Pt uses a stationary bike at home.    PERTINENT HISTORY: R foot drop wears AFO, chronic dizziness; hx of falls, PAD, hx of TIA, L shoulder fx in 2012 L THR (possibly x2)--pt states posterolateral prostate CA in 2004 Lumbar spinal stenosis  PAIN:  NPRS:  3/10 current, 3-4/10 worst pain Location:  R knee  PRECAUTIONS: Fall and Other: THR, AFO on R     WEIGHT BEARING RESTRICTIONS: No  FALLS:  Has patient fallen in last 6 months? Yes. Number of falls I fall every once in a while, I don't count them.  2 falls in the past 2 months including closing the closet door, tripped over his foot   LIVING ENVIRONMENT: Lives with: lives alone and has caregiver come in twice a week Lives in: House/apartment Stairs: yes, ramp to enter home Has following equipment at home: Vannie - 2 wheeled, Environmental Consultant - 4 wheeled, and 3-wheeled RW   PLOF:  Independent with household mobility with device and Independent with community mobility with device   PATIENT GOALS: to be able to get around better, to stand  up from a chair better    OBJECTIVE:  Note: Objective measures were completed at Evaluation unless otherwise noted.  DIAGNOSTIC FINDINGS:  R knee x ray:  01/08/24 FINDINGS:   BONES AND JOINTS: No acute fracture. No focal osseous lesion. No joint dislocation. No significant joint effusion. Mild tricompartmental degenerative changes.   SOFT TISSUES: Soft tissue swelling anterior to the proximal tibial tubercle. Vascular calcifications.   IMPRESSION: 1. Mild tricompartmental degenerative changes. 2. Soft tissue swelling anterior to the proximal tibial tubercle.   MRI 10/2023:  IMPRESSION: 1. Small acute to early subacute right MCA infarcts. 2. Moderate chronic small vessel ischemic disease. 3. Numerous chronic cerebral microhemorrhages and mild superficial siderosis, possibly reflecting cerebral amyloid angiopathy. 4. 13 mm extra-axial mass in the  posterior fossa most consistent with a meningioma. No significant mass effect or brain edema. 5. No major intracranial arterial occlusion or significant proximal intracranial stenosis. 6. 35% stenosis of the proximal right ICA. 7. 65% stenosis of the proximal left subclavian artery. 8. Limited assessment of the proximal V1 segment of the left vertebral artery. A severe stenosis or short segment occlusion is not excluded.  PATIENT SURVEYS:  LEFS:  40/80  COGNITION: Overall cognitive status: Within functional limits for tasks assessed     SENSATION: Pt wearing a brace on R knee.  Pt wearing an AFO on R   PALPATION: No tenderness with palpation to R knee except inferomedial knee.  LOWER EXTREMITY ROM:  Active ROM Right eval Left eval  Hip flexion    Hip extension    Hip abduction    Hip adduction    Hip internal rotation    Hip external rotation    Knee flexion 125   Knee extension 0   Ankle dorsiflexion    Ankle plantarflexion    Ankle inversion    Ankle eversion     (Blank rows = not tested)  LOWER EXTREMITY  MMT:  MMT Right eval Left eval  Hip flexion 4+/5 4+/5  Hip extension    Hip abduction 23.4 19.5  Hip adduction    Hip internal rotation    Hip external rotation    Knee flexion 5/5 seated  5/5 seated  Knee extension 4+/5 4+/5  Ankle dorsiflexion    Ankle plantarflexion    Ankle inversion    Ankle eversion     (Blank rows = not tested)     FUNCTIONAL TESTS:  Pt requires UE support with performing sit to stands.  He has decreased control with lowering to the chair.  Pt required cues to slowly lower himself to the chair. 5x STS test:  29.29 sec with Ue's and walker in front of him.    GAIT: Gait pattern:  Pt wearing AFO and ambulating with 3 wheeled walker. Slow gait, decreased knee extension with gait, decreased toe off on R, step through gait                                                                                                                               TREATMENT:   Reviewed response to prior treatment, pain level, and HEP compliance.   Elliptical L3-4 bilat UE/LE x 5 mins LAQ 2x10, RTB x 10 reps PT worked on Sit to stands from the table with and without Ue's at different heights.  PT educated pt in correct form. Seated hip abd 2x10 with RTB Sidestepping with UE support on rail Standing with NBOS x 30 sec Standing on airex 2x30 sec with min assist and UE support on rail as needed F/B weight shifts on airex with min assist and UE support on rail as needed  PT gave pt a HEP handout and educated pt and aide in correct form  and appropriate frequency.    PATIENT EDUCATION:  Education details: relevant anatomy, POC, dx, and rationale of interventions.  Person educated: Patient and Caregiver   Education method: Explanation Education comprehension: verbalized understanding and needs further education  HOME EXERCISE PROGRAM: Pt has a current HEP for balance.    Access Code: [email or web address] URL: [email or web address] Date: 02/27/2024 Prepared by:  Mose Minerva  Exercises - Seated Long Arc Quad  - 1 x daily - 5-6 x weekly - 2 sets - 10 reps  ASSESSMENT:  CLINICAL IMPRESSION: Pt is not wearing AFO today.  He states the AFO doesn't fit correctly and cuts into his skin.  Pt is getting a new AFO in January.  PT worked on sit to stands from the table with and without UE's at different heights.  Pt requires instruction and cuing in correct form and positioning with sit to stands.  He has decreased control with lowering to the table.  Pt requires much cuing and instruction in correct form with exercises including to slow down and control movements.  Pt has decreased balance with standing on airex and required min assist from PT.  PT gave pt LAQ for a home exercise to improve quad strength.  PT gave pt a HEP handout and educated pt and aide with correct form.  He responded well to treatment having no pain and no c/o's after treatment.  Pt should benefit from skilled PT to address impairments and goals and improve overall function.   OBJECTIVE IMPAIRMENTS: Abnormal gait, decreased balance, decreased mobility, difficulty walking, decreased strength, and dizziness.    ACTIVITY LIMITATIONS: standing, transfers, and locomotion level   PARTICIPATION LIMITATIONS: meal prep, cleaning, laundry, and community activity   PERSONAL FACTORS: 3+ comorbidities: see above are also affecting patient's functional outcome.    REHAB POTENTIAL: Good   CLINICAL DECISION MAKING: Stable/uncomplicated   EVALUATION COMPLEXITY: Low   GOALS:   SHORT TERM GOALS: Target date:  03/13/2024  Pt will demo decreased speed and improved control when lowering to chair with standing to sit.  Baseline: Goal status: INITIAL  2.  Pt will report at least a 25% improvement in R knee pain overall.   Baseline:  Goal status: INITIAL Target date:  03/20/2024    LONG TERM GOALS: Target date: 04/04/2024  Pt will be independent and compliant with HEP for improved strength, pain,  and functional mobility.   Baseline:  Goal status: INITIAL  2.  Pt will demo improved bilat knee extension strength to 5/5 MMT for improved strength and performance of sit to stands.  Baseline:  Goal status: INITIAL  3.  Pt will perform sit to/from stand transfers safely with the usage of his hands.   Baseline:  Goal status: INITIAL  4.  Pt will report improved tolerance and stability with standing activities. Baseline:  Goal status: INITIAL     PLAN:  PT FREQUENCY: 1x/week  PT DURATION: other: 5-6 weeks  PLANNED INTERVENTIONS: 97750- Physical Performance Testing, 97110-Therapeutic exercises, 97530- Therapeutic activity, [id number]- Neuromuscular re-education, 97535- Self Care, [id number]- Manual therapy, [phone]- Gait training, [phone]- Canalith repositioning, Patient/Family education, Balance training, and Vestibular training   PLAN FOR NEXT SESSION: Work on sit to stands.  Update HEP accordingly.  Quad strength   Leigh Minerva III PT, DPT 02/28/2024 5:18 PM

## 2024-02-28 ENCOUNTER — Encounter (HOSPITAL_BASED_OUTPATIENT_CLINIC_OR_DEPARTMENT_OTHER): Payer: Self-pay | Admitting: Physical Therapy

## 2024-04-07 ENCOUNTER — Ambulatory Visit (HOSPITAL_BASED_OUTPATIENT_CLINIC_OR_DEPARTMENT_OTHER): Admitting: Physical Therapy

## 2024-04-21 ENCOUNTER — Ambulatory Visit (HOSPITAL_BASED_OUTPATIENT_CLINIC_OR_DEPARTMENT_OTHER): Admitting: Cardiology

## 2024-05-13 ENCOUNTER — Ambulatory Visit (HOSPITAL_BASED_OUTPATIENT_CLINIC_OR_DEPARTMENT_OTHER): Admitting: Physical Therapy

## 2024-07-02 ENCOUNTER — Ambulatory Visit: Admitting: Internal Medicine

## 2024-07-15 ENCOUNTER — Ambulatory Visit (HOSPITAL_BASED_OUTPATIENT_CLINIC_OR_DEPARTMENT_OTHER): Admitting: Cardiology

## 2024-09-03 ENCOUNTER — Ambulatory Visit
